# Patient Record
Sex: Male | Born: 1960 | Race: Black or African American | Hispanic: No | Marital: Single | Smoking: Never smoker
Health system: Southern US, Community
[De-identification: ages and names within clinical notes are randomized; demographics above are authoritative.]

## PROBLEM LIST (undated history)

## (undated) DIAGNOSIS — F79 Unspecified intellectual disabilities: Secondary | ICD-10-CM

## (undated) DIAGNOSIS — F419 Anxiety disorder, unspecified: Secondary | ICD-10-CM

## (undated) DIAGNOSIS — H6690 Otitis media, unspecified, unspecified ear: Secondary | ICD-10-CM

## (undated) DIAGNOSIS — I10 Essential (primary) hypertension: Secondary | ICD-10-CM

## (undated) DIAGNOSIS — F952 Tourette's disorder: Secondary | ICD-10-CM

## (undated) DIAGNOSIS — C801 Malignant (primary) neoplasm, unspecified: Secondary | ICD-10-CM

## (undated) DIAGNOSIS — E785 Hyperlipidemia, unspecified: Secondary | ICD-10-CM

## (undated) DIAGNOSIS — K859 Acute pancreatitis without necrosis or infection, unspecified: Secondary | ICD-10-CM

## (undated) DIAGNOSIS — F259 Schizoaffective disorder, unspecified: Secondary | ICD-10-CM

## (undated) DIAGNOSIS — H9190 Unspecified hearing loss, unspecified ear: Secondary | ICD-10-CM

## (undated) DIAGNOSIS — K5909 Other constipation: Secondary | ICD-10-CM

## (undated) DIAGNOSIS — R4587 Impulsiveness: Secondary | ICD-10-CM

## (undated) DIAGNOSIS — F99 Mental disorder, not otherwise specified: Secondary | ICD-10-CM

## (undated) DIAGNOSIS — E039 Hypothyroidism, unspecified: Secondary | ICD-10-CM

## (undated) HISTORY — DX: Hypothyroidism, unspecified: E03.9

## (undated) HISTORY — PX: EXTERNAL EAR SURGERY: SHX627

---

## 2011-03-25 ENCOUNTER — Encounter (HOSPITAL_COMMUNITY): Payer: Self-pay | Admitting: *Deleted

## 2011-03-25 ENCOUNTER — Emergency Department (HOSPITAL_COMMUNITY)
Admission: EM | Admit: 2011-03-25 | Discharge: 2011-03-26 | Disposition: A | Discharge status: Home Health | Payer: Medicare Other | Attending: Emergency Medicine | Admitting: Emergency Medicine

## 2011-03-25 DIAGNOSIS — E119 Type 2 diabetes mellitus without complications: Secondary | ICD-10-CM | POA: Insufficient documentation

## 2011-03-25 DIAGNOSIS — F79 Unspecified intellectual disabilities: Secondary | ICD-10-CM | POA: Insufficient documentation

## 2011-03-25 DIAGNOSIS — I10 Essential (primary) hypertension: Secondary | ICD-10-CM | POA: Insufficient documentation

## 2011-03-25 DIAGNOSIS — R109 Unspecified abdominal pain: Secondary | ICD-10-CM | POA: Insufficient documentation

## 2011-03-25 DIAGNOSIS — E785 Hyperlipidemia, unspecified: Secondary | ICD-10-CM | POA: Insufficient documentation

## 2011-03-25 DIAGNOSIS — R1110 Vomiting, unspecified: Secondary | ICD-10-CM | POA: Insufficient documentation

## 2011-03-25 HISTORY — DX: Essential (primary) hypertension: I10

## 2011-03-25 HISTORY — DX: Anxiety disorder, unspecified: F41.9

## 2011-03-25 HISTORY — DX: Hyperlipidemia, unspecified: E78.5

## 2011-03-25 HISTORY — DX: Mental disorder, not otherwise specified: F99

## 2011-03-25 LAB — CBC
HCT: 48.6 % (ref 39.0–52.0)
Hemoglobin: 17 g/dL (ref 13.0–17.0)
MCH: 28.3 pg (ref 26.0–34.0)
MCHC: 35 g/dL (ref 30.0–36.0)
MCV: 81 fL (ref 78.0–100.0)
Platelets: 320 10*3/uL (ref 150–400)
RBC: 6 MIL/uL — ABNORMAL HIGH (ref 4.22–5.81)
RDW: 13.9 % (ref 11.5–15.5)
WBC: 11.8 10*3/uL — ABNORMAL HIGH (ref 4.0–10.5)

## 2011-03-25 MED ORDER — HYDROMORPHONE HCL PF 1 MG/ML IJ SOLN
1.0000 mg | Freq: Once | INTRAMUSCULAR | Status: AC
  Administered 2011-03-25: 1 mg via INTRAVENOUS
  Filled 2011-03-25: qty 1

## 2011-03-25 MED ORDER — IOHEXOL 300 MG/ML  SOLN
20.0000 mL | INTRAMUSCULAR | Status: DC
  Administered 2011-03-25: 20 mL via ORAL

## 2011-03-25 MED ORDER — SODIUM CHLORIDE 0.9 % IV BOLUS (SEPSIS)
1000.0000 mL | Freq: Once | INTRAVENOUS | Status: AC
  Administered 2011-03-25: 1000 mL via INTRAVENOUS

## 2011-03-25 MED ORDER — DIAZEPAM 5 MG/ML IJ SOLN
5.0000 mg | Freq: Once | INTRAMUSCULAR | Status: AC
  Administered 2011-03-25: 5 mg via INTRAVENOUS
  Filled 2011-03-25: qty 2

## 2011-03-25 MED ORDER — ONDANSETRON HCL 4 MG/2ML IJ SOLN
4.0000 mg | Freq: Once | INTRAMUSCULAR | Status: AC
  Administered 2011-03-25: 4 mg via INTRAVENOUS
  Filled 2011-03-25: qty 2

## 2011-03-25 NOTE — ED Notes (Signed)
Pt states improved pain after pain medications, though he continues to cry.

## 2011-03-25 NOTE — ED Notes (Signed)
Per EMS: pt from [facility], vomited approximately 1 hour ago.  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  No guarding, abdominal masses.

## 2011-03-26 ENCOUNTER — Encounter (HOSPITAL_COMMUNITY): Payer: Self-pay | Admitting: Radiology

## 2011-03-26 ENCOUNTER — Emergency Department (HOSPITAL_COMMUNITY): Payer: Medicare Other

## 2011-03-26 LAB — COMPREHENSIVE METABOLIC PANEL
ALT: 47 U/L (ref 0–53)
AST: 36 U/L (ref 0–37)
Albumin: 4.1 g/dL (ref 3.5–5.2)
Alkaline Phosphatase: 102 U/L (ref 39–117)
BUN: 18 mg/dL (ref 6–23)
CO2: 20 mEq/L (ref 19–32)
Calcium: 9.9 mg/dL (ref 8.4–10.5)
Chloride: 104 mEq/L (ref 96–112)
Creatinine, Ser: 1.86 mg/dL — ABNORMAL HIGH (ref 0.50–1.35)
GFR calc Af Amer: 47 mL/min — ABNORMAL LOW (ref >90)
GFR calc non Af Amer: 40 mL/min — ABNORMAL LOW (ref >90)
Glucose, Bld: 173 mg/dL — ABNORMAL HIGH (ref 70–99)
Potassium: 3.8 mEq/L (ref 3.5–5.1)
Sodium: 140 mEq/L (ref 135–145)
Total Bilirubin: 0.7 mg/dL (ref 0.3–1.2)
Total Protein: 8.1 g/dL (ref 6.0–8.3)

## 2011-03-26 LAB — URINALYSIS, ROUTINE W REFLEX MICROSCOPIC
Bilirubin Urine: NEGATIVE
Glucose, UA: NEGATIVE mg/dL
Hgb urine dipstick: NEGATIVE
Ketones, ur: NEGATIVE mg/dL
Leukocytes, UA: NEGATIVE
Nitrite: NEGATIVE
Protein, ur: NEGATIVE mg/dL
Specific Gravity, Urine: 1.01 (ref 1.005–1.030)
Urobilinogen, UA: 0.2 mg/dL (ref 0.0–1.0)
pH: 5.5 (ref 5.0–8.0)

## 2011-03-26 LAB — LIPASE, BLOOD: Lipase: 40 U/L (ref 11–59)

## 2011-03-26 MED ORDER — ONDANSETRON 4 MG PO TBDP: 4.0000 mg | ORAL_TABLET | Freq: Four times a day (QID) | ORAL | Status: AC | PRN

## 2011-03-26 MED ORDER — HALOPERIDOL LACTATE 5 MG/ML IJ SOLN
3.0000 mg | Freq: Once | INTRAMUSCULAR | Status: AC
  Administered 2011-03-26: 3 mg via INTRAVENOUS
  Filled 2011-03-26: qty 1

## 2011-03-26 MED ORDER — IOHEXOL 300 MG/ML  SOLN
100.0000 mL | Freq: Once | INTRAMUSCULAR | Status: AC | PRN
  Administered 2011-03-26: 100 mL via INTRAVENOUS

## 2011-03-26 MED ORDER — METOCLOPRAMIDE HCL 5 MG/ML IJ SOLN
5.0000 mg | Freq: Once | INTRAMUSCULAR | Status: AC
  Administered 2011-03-26: 5 mg via INTRAVENOUS
  Filled 2011-03-26: qty 2

## 2011-03-26 NOTE — ED Notes (Signed)
Pt given PO challenge.

## 2011-03-26 NOTE — ED Provider Notes (Addendum)
Pt was making "crying noises" on my arrival to room, but did not have any tears.  Pt immediately stopped when I told him that his testing today was normal.  Pt has tol PO well while in ED without N/V.  Abd benign on exam.  VSS, resps easy.  Will d/c home with outpt f/u.   Ct Abdomen Pelvis W Contrast 03/26/2011  *RADIOLOGY REPORT*  Clinical Data: Abdominal pain; vomiting.  CT ABDOMEN AND PELVIS WITH CONTRAST  Technique:  Multidetector CT imaging of the abdomen and pelvis was performed following the standard protocol during bolus administration of intravenous contrast.  Contrast: OMNIPAQUE IOHEXOL 300 MG/ML IJ SOLN  Comparison: CT of the abdomen and pelvis performed 11/23/2008  Findings: Bibasilar airspace opacities appear grossly stable and likely reflect atelectasis and scarring.  The liver and spleen are unremarkable in appearance.  The gallbladder is within normal limits.  The pancreas and adrenal glands are unremarkable.  There is a 1.2 cm left renal cyst.  Additional smaller right renal cysts are seen.  Scattered nonspecific perinephric stranding is noted bilaterally.  There is no evidence of hydronephrosis.  No renal or ureteral stones are identified.  No free fluid is identified.  The small bowel is unremarkable in appearance.  The stomach is filled with contrast and is within normal limits.  No acute vascular abnormalities are seen.  The appendix is not seen; there is no evidence for appendicitis.  A single diverticulum is noted along the mid sigmoid colon.  The colon is otherwise unremarkable in appearance.  The bladder is moderately distended and grossly unremarkable in appearance.  The prostate is normal in size.  No inguinal lymphadenopathy is seen.  No acute osseous abnormalities are identified.  IMPRESSION:  1.  No acute abnormalities seen within the abdomen or pelvis. 2.  Scattered small bilateral renal cysts seen. 3.  Bibasilar airspace opacities appear grossly stable and likely reflect  atelectasis and scarring.  Original Report Authenticated By: Tonia Ghent, M.D.    Laray Anger, DO 03/26/11 [phone]

## 2011-03-26 NOTE — ED Notes (Signed)
Pt tolerated po crackers and gingerale.  Dr Clarene Duke notified.  PTAR called.

## 2011-03-26 NOTE — ED Provider Notes (Cosign Needed)
History    51 year old male with abdominal pain. 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 He lives in a group home. Per report he began complaining of abdominal pain today. He vomited once earlier today. No fevers reported. No reported trauma. Patient crying and moaning. Per sister and mother, acts like this when he is in pain. No history of any abdominal surgery. Waves hand across abdomen when asked to point to where hurts.  CSN: [id number]  Arrival date & time 03/25/11  2210   First MD Initiated Contact with Patient 03/25/11 2300      Chief Complaint  Patient presents with  . Abdominal Pain    (Consider location/radiation/quality/duration/timing/severity/associated sxs/prior treatment) HPI  Past Medical History  Diagnosis Date  . Diabetes mellitus   . Anxiety   . Hypertension   . Hyperlipemia   . Mental disorder     History reviewed. No pertinent past surgical history.  History reviewed. No pertinent family history.  History  Substance Use Topics  . Smoking status: Not on file  . Smokeless tobacco: Not on file  . Alcohol Use:       Review of Systems  Level 5 caveat because of mental retardation.  Allergies  Review of patient's allergies indicates no known allergies.  Home Medications   Current Outpatient Rx  Name Route Sig Dispense Refill  . ASPIRIN 81 MG PO CHEW Oral Chew 81 mg by mouth daily.    Marland Kitchen LORATADINE 10 MG PO TABS Oral Take 10 mg by mouth daily.    Marland Kitchen LORAZEPAM 1 MG PO TABS Oral Take 1 mg by mouth every 12 (twelve) hours as needed. For agitation    . LUBIPROSTONE 24 MCG PO CAPS Oral Take 24 mcg by mouth 2 (two) times daily with a meal.    . METFORMIN HCL ER 500 MG PO TB24 Oral Take 500 mg by mouth daily with breakfast.    . OMEPRAZOLE 20 MG PO CPDR Oral Take 20 mg by mouth daily.    Marland Kitchen PRAVASTATIN SODIUM 20 MG PO TABS Oral Take 20 mg by mouth daily.    . QUETIAPINE FUMARATE 100 MG PO TABS Oral Take 100 mg by mouth 2  (two) times daily.      BP 159/111  Pulse 136  Temp(Src) 100.1 F (37.8 C) (Oral)  Resp 17  SpO2 95%  Physical Exam  Nursing note and vitals reviewed. Constitutional:       At bedtime appearing. Crying.  rocking and moaning.  HENT:  Head: Normocephalic and atraumatic.  Eyes: Conjunctivae are normal. Pupils are equal, round, and reactive to light. Right eye exhibits no discharge. Left eye exhibits no discharge.  Neck: Neck supple.  Cardiovascular: Normal rate, regular rhythm and normal heart sounds.  Exam reveals no gallop and no friction rub.   No murmur heard. Pulmonary/Chest: Effort normal and breath sounds normal. No respiratory distress.  Abdominal: He exhibits no distension. There is tenderness.       Difficult to examine because patient tensing abdominal muscles when crying and difficult to console. Does appear to be tender diffusely though.  Genitourinary:       No costovertebral angle tenderness  Musculoskeletal: He exhibits no edema and no tenderness.  Neurological: He is alert.  Skin: Skin is warm and dry. He is not diaphoretic.    ED Course  Procedures (including critical care time)  Labs Reviewed  COMPREHENSIVE METABOLIC PANEL - Abnormal; Notable for the following:    Glucose,  Bld 173 (*)    Creatinine, Ser 1.86 (*)    GFR calc non Af Amer 40 (*)    GFR calc Af Amer 47 (*)    All other components within normal limits  CBC - Abnormal; Notable for the following:    WBC 11.8 (*)    RBC 6.00 (*)    All other components within normal limits  LIPASE, BLOOD  URINALYSIS, ROUTINE W REFLEX MICROSCOPIC   No results found.   1. Abdominal pain       MDM  50yM with abdominal pain. Cr 1.86. Unfortunately no prior labs for comparison. Family pleasant but poor historians and not sure is hx of renal insufficiency or not. Pt is diabetic and has hx of HTN. Suspect underlying chronic kidney dz although cannot definitively confirm. CT with no evidence of acute pathology  which would explain abdominal pain. Possible viral illness. Unfortunately MR makes obtaining history very difficult and makes examination difficult as well. There does not appear to be an emergent etiology of this complaint though. Afebrile and HD stable. Mild leukocytosis but may be stress demargination. UA pending. Discussed with Dr. Clarene Duke in signout. Will DC back to group home. Abdominal pain instructions.        Raeford Razor, MD 03/26/11 [phone]

## 2011-03-26 NOTE — ED Notes (Signed)
PT calming down after haldol.  CT called to take pt.  PT has retained contrast.

## 2011-03-26 NOTE — ED Notes (Signed)
Pt is crying inconsolably stating that he wants to go home.  Hx of MR, but he states he has been having abd pain since this am. Abd non-tender. Pt is crying to loud to hear bs.

## 2011-03-26 NOTE — ED Notes (Signed)
PT states pain and nausea beginning to increase and he would like some pain and nausea meds.  Dr Clarene Duke notified.

## 2011-03-26 NOTE — Discharge Instructions (Signed)
Abdominal Pain Abdominal pain can be caused by many things. Your caregiver decides the seriousness of your pain by an examination and possibly blood tests and X-rays. Many cases can be observed and treated at home. Most abdominal pain is not caused by a disease and will probably improve without treatment. However, in many cases, more time must pass before a clear cause of the pain can be found. Before that point, it may not be known if you need more testing, or if hospitalization or surgery is needed. HOME CARE INSTRUCTIONS   Do not take laxatives unless directed by your caregiver.   Take pain medicine only as directed by your caregiver.   Only take over-the-counter or prescription medicines for pain, discomfort, or fever as directed by your caregiver.   Try a clear liquid diet (broth, tea, or water) for as long as directed by your caregiver. Slowly move to a bland diet as tolerated.  SEEK IMMEDIATE MEDICAL CARE IF:   The pain does not go away.   You have a fever.   You keep throwing up (vomiting).   The pain is felt only in portions of the abdomen. Pain in the right side could possibly be appendicitis. In an adult, pain in the left lower portion of the abdomen could be colitis or diverticulitis.   You pass bloody or black tarry stools.  MAKE SURE YOU:   Understand these instructions.   Will watch your condition.   Will get help right away if you are not doing well or get worse.  Document Released: 10/22/2004 Document Revised: 09/24/2010 Document Reviewed: 08/31/2007 [facility] Patient Information 2012 [place], [place]. RESOURCE GUIDE  Dental Problems  Patients with Medicaid: [facility] [phone] [address]                                           [phone] [address] Phone:  [phone]                                                  Phone:  [phone]  If unable to pay or uninsured, contact:  [facility] or [facility]. to become qualified for the adult dental clinic.  Chronic Pain Problems Contact [facility]  [phone] Patients need to be referred by their primary care doctor.  Insufficient Money for Medicine Contact [employer]:  call "211" or [facility] 231-038-8999.  No Primary Care Doctor Call [facility]  [phone] Other agencies that provide inexpensive medical care    [facility]  [phone]    [facility]  [phone]    [facility]  [phone]    [facility]  [phone]    [facility]  2091429391    [facility]  [phone]  Psychological Services [facility]  [phone] [facility]  [phone] [facility]   [phone] (emergency services [phone])  Substance Abuse Resources [facility]  [phone] [facility] [phone] [facility] [place] [phone] Floydene Flock [phone] Residential & Outpatient Substance Abuse Program  [phone]  Abuse/Neglect [facility] Child Abuse Hotline 8476857751 [facility] Child Abuse Hotline 639-331-0321 (After Hours)  Emergency Shelter [facility] [phone]  [facility] [place] [facility] [phone] [facility] [phone]  MRSA Hotline #:   213-515-6495    [facility] Resources  [facility]     [employer]                          [facility] Dept. [address] [place]                       [address]      [address]  Blondell Reveal Phone:  [phone]                                   Phone:  [phone]                 Phone:  [phone]  [facility] Mental [facility] Phone:  [phone]  [facility] Child Abuse Hotline 6296702044 5097013454 (After  Hours)    Take the prescription as directed.  Continue your usual prescriptions as previously directed.  Increase your fluid intake (ie:  Gatoraide) for the next few days.  Eat a bland diet and advance to your regular diet slowly as you can tolerate it.  Call your regular medical doctor today to schedule a follow up appointment this week.  Return to the Emergency Department immediately if not improving (or even worsening) despite taking the medicines as prescribed, any black or bloody stool or vomit, if you develop a fever, or for any other concerns.

## 2011-03-27 LAB — URINE CULTURE

## 2011-08-20 ENCOUNTER — Encounter (HOSPITAL_COMMUNITY): Payer: Self-pay

## 2011-08-20 ENCOUNTER — Emergency Department (HOSPITAL_COMMUNITY)
Admission: EM | Admit: 2011-08-20 | Discharge: 2011-08-21 | Disposition: A | Discharge status: Skilled Nursing Facility | Payer: Medicare Other | Attending: Emergency Medicine | Admitting: Emergency Medicine

## 2011-08-20 DIAGNOSIS — F259 Schizoaffective disorder, unspecified: Secondary | ICD-10-CM | POA: Insufficient documentation

## 2011-08-20 DIAGNOSIS — F419 Anxiety disorder, unspecified: Secondary | ICD-10-CM

## 2011-08-20 DIAGNOSIS — F411 Generalized anxiety disorder: Secondary | ICD-10-CM | POA: Insufficient documentation

## 2011-08-20 DIAGNOSIS — I10 Essential (primary) hypertension: Secondary | ICD-10-CM | POA: Insufficient documentation

## 2011-08-20 DIAGNOSIS — E119 Type 2 diabetes mellitus without complications: Secondary | ICD-10-CM | POA: Insufficient documentation

## 2011-08-20 DIAGNOSIS — R509 Fever, unspecified: Secondary | ICD-10-CM | POA: Insufficient documentation

## 2011-08-20 DIAGNOSIS — E785 Hyperlipidemia, unspecified: Secondary | ICD-10-CM | POA: Insufficient documentation

## 2011-08-20 DIAGNOSIS — J40 Bronchitis, not specified as acute or chronic: Secondary | ICD-10-CM

## 2011-08-20 HISTORY — DX: Malignant (primary) neoplasm, unspecified: C80.1

## 2011-08-20 HISTORY — DX: Otitis media, unspecified, unspecified ear: H66.90

## 2011-08-20 HISTORY — DX: Acute pancreatitis without necrosis or infection, unspecified: K85.90

## 2011-08-20 HISTORY — DX: Unspecified hearing loss, unspecified ear: H91.90

## 2011-08-20 HISTORY — DX: Impulsiveness: R45.87

## 2011-08-20 HISTORY — DX: Schizoaffective disorder, unspecified: F25.9

## 2011-08-20 MED ORDER — LORAZEPAM 2 MG/ML IJ SOLN
1.0000 mg | Freq: Once | INTRAMUSCULAR | Status: AC
  Administered 2011-08-21: 1 mg via INTRAVENOUS
  Filled 2011-08-20: qty 1

## 2011-08-20 NOTE — ED Notes (Signed)
Pt presents to ED via EMS from home with c/o severe agitation.  Pt hx MR with "brain capacity of 51 yr old"  Pt c/o "I think i am about to die".  Pt excessive crying at this time.

## 2011-08-21 ENCOUNTER — Emergency Department (HOSPITAL_COMMUNITY): Payer: Medicare Other

## 2011-08-21 LAB — URINALYSIS, ROUTINE W REFLEX MICROSCOPIC
Ketones, ur: NEGATIVE mg/dL
Leukocytes, UA: NEGATIVE
Nitrite: NEGATIVE
Protein, ur: NEGATIVE mg/dL
Urobilinogen, UA: 1 mg/dL (ref 0.0–1.0)

## 2011-08-21 LAB — COMPREHENSIVE METABOLIC PANEL
Albumin: 3.9 g/dL (ref 3.5–5.2)
Alkaline Phosphatase: 101 U/L (ref 39–117)
BUN: 14 mg/dL (ref 6–23)
Chloride: 100 mEq/L (ref 96–112)
Creatinine, Ser: 1.61 mg/dL — ABNORMAL HIGH (ref 0.50–1.35)
GFR calc Af Amer: 55 mL/min — ABNORMAL LOW (ref >90)
GFR calc non Af Amer: 48 mL/min — ABNORMAL LOW (ref >90)
Glucose, Bld: 151 mg/dL — ABNORMAL HIGH (ref 70–99)
Potassium: 3.8 mEq/L (ref 3.5–5.1)
Total Bilirubin: 0.3 mg/dL (ref 0.3–1.2)

## 2011-08-21 LAB — CBC WITH DIFFERENTIAL/PLATELET
Basophils Relative: 0 % (ref 0–1)
HCT: 45.9 % (ref 39.0–52.0)
Hemoglobin: 15.7 g/dL (ref 13.0–17.0)
Lymphs Abs: 0.8 10*3/uL (ref 0.7–4.0)
MCH: 27.4 pg (ref 26.0–34.0)
MCHC: 34.2 g/dL (ref 30.0–36.0)
Monocytes Absolute: 0.9 10*3/uL (ref 0.1–1.0)
Monocytes Relative: 11 % (ref 3–12)
Neutro Abs: 6.3 10*3/uL (ref 1.7–7.7)
RBC: 5.74 MIL/uL (ref 4.22–5.81)

## 2011-08-21 LAB — GLUCOSE, CAPILLARY: Glucose-Capillary: 143 mg/dL — ABNORMAL HIGH (ref 70–99)

## 2011-08-21 MED ORDER — ACETAMINOPHEN 325 MG PO TABS
650.0000 mg | ORAL_TABLET | Freq: Once | ORAL | Status: AC
  Administered 2011-08-21: 650 mg via ORAL
  Filled 2011-08-21: qty 2

## 2011-08-21 MED ORDER — LORAZEPAM 2 MG/ML IJ SOLN
1.0000 mg | Freq: Once | INTRAMUSCULAR | Status: DC
  Filled 2011-08-21: qty 1

## 2011-08-21 MED ORDER — ZIPRASIDONE MESYLATE 20 MG IM SOLR
10.0000 mg | Freq: Once | INTRAMUSCULAR | Status: AC
  Administered 2011-08-21: 10 mg via INTRAMUSCULAR
  Filled 2011-08-21 (×2): qty 20

## 2011-08-21 MED ORDER — ZIPRASIDONE MESYLATE 20 MG IM SOLR
20.0000 mg | Freq: Once | INTRAMUSCULAR | Status: AC
  Administered 2011-08-21: 20 mg via INTRAMUSCULAR
  Filled 2011-08-21: qty 20

## 2011-08-21 MED ORDER — HALOPERIDOL 2 MG PO TABS: ORAL_TABLET | ORAL | Status: DC

## 2011-08-21 NOTE — ED Notes (Signed)
Pt crying out loud, uncontrollably.

## 2011-08-21 NOTE — ED Notes (Signed)
Pt laughing and coughing. Pt keeps stating, "I think I'm going to die."

## 2011-08-21 NOTE — ED Notes (Addendum)
[facility] contacted and informed pt was just given Geodon and Aceteminophen. [name] informed.

## 2011-08-21 NOTE — ED Provider Notes (Signed)
History     CSN: [id number]  Arrival date & time 08/20/11  2319   First MD Initiated Contact with Patient 08/20/11 2328      Chief Complaint  Patient presents with  . Anxiety    (Consider location/radiation/quality/duration/timing/severity/associated sxs/prior treatment) Patient is a 51 y.o. male presenting with altered mental status. The history is provided by the EMS personnel (pt brought from group home with anxiety). No language interpreter was used.  Altered Mental Status This is a new problem. The current episode started 1 to 2 hours ago. The problem occurs constantly. The problem has not changed since onset.Pertinent negatives include no abdominal pain. Nothing aggravates the symptoms. Nothing relieves the symptoms. He has tried nothing for the symptoms. The treatment provided moderate relief.    Past Medical History  Diagnosis Date  . Diabetes mellitus   . Anxiety   . Hypertension   . Hyperlipemia   . Mental disorder   . Schizoaffective disorder   . Impulsive control disorder  . Pancreatitis   . Otitis media   . Cancer   . HOH (hard of hearing)     No past surgical history on file.  No family history on file.  History  Substance Use Topics  . Smoking status: Not on file  . Smokeless tobacco: Not on file  . Alcohol Use:       Review of Systems  Unable to perform ROS: Mental status change  Gastrointestinal: Negative for abdominal pain.  Psychiatric/Behavioral: Positive for altered mental status.    Allergies  Review of patient's allergies indicates no known allergies.  Home Medications   Current Outpatient Rx  Name Route Sig Dispense Refill  . ASPIRIN 81 MG PO CHEW Oral Chew 81 mg by mouth daily.    . CHOLECALCIFEROL 400 UNITS PO TABS Oral Take 400 Units by mouth daily.    Marland Kitchen LORATADINE 10 MG PO TABS Oral Take 10 mg by mouth daily.    Marland Kitchen LORAZEPAM 1 MG PO TABS Oral Take 1 mg by mouth every 8 (eight) hours as needed. For agitation    . LUBIPROSTONE  24 MCG PO CAPS Oral Take 24 mcg by mouth 2 (two) times daily with a meal.    . METFORMIN HCL ER 500 MG PO TB24 Oral Take 500 mg by mouth daily with breakfast.    . PRAVASTATIN SODIUM 20 MG PO TABS Oral Take 20 mg by mouth at bedtime.     Marland Kitchen QUETIAPINE FUMARATE 100 MG PO TABS Oral Take 100 mg by mouth 2 (two) times daily.      BP 133/95  Pulse 109  Temp 101.5 F (38.6 C) (Oral)  Resp 23  SpO2 93%  Physical Exam  Constitutional: He appears well-developed.  HENT:  Head: Normocephalic and atraumatic.  Eyes: Conjunctivae and EOM are normal. No scleral icterus.  Neck: Neck supple. No thyromegaly present.  Cardiovascular: Normal rate and regular rhythm.  Exam reveals no gallop and no friction rub.   No murmur heard. Pulmonary/Chest: No stridor. He has no wheezes. He has no rales. He exhibits no tenderness.  Abdominal: He exhibits no distension. There is no tenderness. There is no rebound.  Musculoskeletal: Normal range of motion. He exhibits no edema.  Lymphadenopathy:    He has no cervical adenopathy.  Neurological:       Awake,  But very anxious and not speaking  Skin: No rash noted. No erythema.  Psychiatric:       anxious    ED  Course  Procedures (including critical care time)  Labs Reviewed  CBC WITH DIFFERENTIAL - Abnormal; Notable for the following:    Lymphocytes Relative 10 (*)     All other components within normal limits  COMPREHENSIVE METABOLIC PANEL - Abnormal; Notable for the following:    Glucose, Bld 151 (*)     Creatinine, Ser 1.61 (*)     GFR calc non Af Amer 48 (*)     GFR calc Af Amer 55 (*)     All other components within normal limits  GLUCOSE, CAPILLARY - Abnormal; Notable for the following:    Glucose-Capillary 143 (*)     All other components within normal limits  URINALYSIS, ROUTINE W REFLEX MICROSCOPIC   Dg Chest Port 1 View  08/21/2011  *RADIOLOGY REPORT*  Clinical Data: Shortness of breath.  PORTABLE CHEST - 1 VIEW  Comparison: 09/28/2009   Findings: Low lung volumes with bibasilar atelectasis.  No edema or infiltrates.  Heart size is normal.  No bony abnormalities identified.  IMPRESSION: Low lung volumes with bibasilar atelectasis.  Original Report Authenticated By: Reola Calkins, M.D.     1. Anxiety   2. Fever      Pt improved with geodon MDM          Benny Lennert, MD 08/21/11 [phone]

## 2011-08-21 NOTE — ED Notes (Addendum)
[facility]- Shawn Baird, the caregiver- informed about pt being discharged from ED. PTAR called.

## 2011-08-22 ENCOUNTER — Emergency Department (HOSPITAL_COMMUNITY): Payer: Medicare Other

## 2011-08-22 ENCOUNTER — Emergency Department (HOSPITAL_COMMUNITY)
Admission: EM | Admit: 2011-08-22 | Discharge: 2011-08-22 | Disposition: A | Discharge status: Home / Self Care | Payer: Medicare Other | Attending: Emergency Medicine | Admitting: Emergency Medicine

## 2011-08-22 DIAGNOSIS — F259 Schizoaffective disorder, unspecified: Secondary | ICD-10-CM | POA: Insufficient documentation

## 2011-08-22 DIAGNOSIS — E119 Type 2 diabetes mellitus without complications: Secondary | ICD-10-CM | POA: Insufficient documentation

## 2011-08-22 DIAGNOSIS — J189 Pneumonia, unspecified organism: Secondary | ICD-10-CM

## 2011-08-22 DIAGNOSIS — I10 Essential (primary) hypertension: Secondary | ICD-10-CM | POA: Insufficient documentation

## 2011-08-22 DIAGNOSIS — F411 Generalized anxiety disorder: Secondary | ICD-10-CM | POA: Insufficient documentation

## 2011-08-22 DIAGNOSIS — E785 Hyperlipidemia, unspecified: Secondary | ICD-10-CM | POA: Insufficient documentation

## 2011-08-22 LAB — URINALYSIS, ROUTINE W REFLEX MICROSCOPIC
Glucose, UA: NEGATIVE mg/dL
Hgb urine dipstick: NEGATIVE
Protein, ur: 30 mg/dL — AB
Urobilinogen, UA: 0.2 mg/dL (ref 0.0–1.0)

## 2011-08-22 LAB — CBC WITH DIFFERENTIAL/PLATELET
Basophils Relative: 1 % (ref 0–1)
HCT: 48.3 % (ref 39.0–52.0)
Hemoglobin: 16.3 g/dL (ref 13.0–17.0)
Lymphocytes Relative: 18 % (ref 12–46)
Lymphs Abs: 1.3 10*3/uL (ref 0.7–4.0)
MCHC: 33.7 g/dL (ref 30.0–36.0)
Monocytes Absolute: 1 10*3/uL (ref 0.1–1.0)
Monocytes Relative: 15 % — ABNORMAL HIGH (ref 3–12)
Neutro Abs: 4.3 10*3/uL (ref 1.7–7.7)
Neutrophils Relative %: 64 % (ref 43–77)
RBC: 6.03 MIL/uL — ABNORMAL HIGH (ref 4.22–5.81)
WBC: 6.8 10*3/uL (ref 4.0–10.5)

## 2011-08-22 LAB — COMPREHENSIVE METABOLIC PANEL
Albumin: 4 g/dL (ref 3.5–5.2)
Alkaline Phosphatase: 104 U/L (ref 39–117)
BUN: 15 mg/dL (ref 6–23)
CO2: 21 mEq/L (ref 19–32)
Chloride: 103 mEq/L (ref 96–112)
Creatinine, Ser: 1.84 mg/dL — ABNORMAL HIGH (ref 0.50–1.35)
GFR calc non Af Amer: 41 mL/min — ABNORMAL LOW (ref >90)
Potassium: 3.8 mEq/L (ref 3.5–5.1)
Total Bilirubin: 0.5 mg/dL (ref 0.3–1.2)

## 2011-08-22 LAB — URINE MICROSCOPIC-ADD ON

## 2011-08-22 MED ORDER — ONDANSETRON HCL 4 MG/2ML IJ SOLN
4.0000 mg | Freq: Once | INTRAMUSCULAR | Status: AC
  Administered 2011-08-22: 4 mg via INTRAVENOUS
  Filled 2011-08-22: qty 2

## 2011-08-22 MED ORDER — MOXIFLOXACIN HCL 400 MG PO TABS: 400.0000 mg | ORAL_TABLET | Freq: Every day | ORAL | Status: AC

## 2011-08-22 MED ORDER — MOXIFLOXACIN HCL IN NACL 400 MG/250ML IV SOLN
400.0000 mg | Freq: Once | INTRAVENOUS | Status: AC
  Administered 2011-08-22: 400 mg via INTRAVENOUS
  Filled 2011-08-22: qty 250

## 2011-08-22 MED ORDER — HYDROCODONE-ACETAMINOPHEN 5-325 MG PO TABS: ORAL_TABLET | ORAL | Status: DC

## 2011-08-22 MED ORDER — MORPHINE SULFATE 4 MG/ML IJ SOLN
4.0000 mg | Freq: Once | INTRAMUSCULAR | Status: AC
  Administered 2011-08-22: 4 mg via INTRAVENOUS
  Filled 2011-08-22: qty 1

## 2011-08-22 NOTE — ED Notes (Signed)
Bed:WA13<BR> Expected date:<BR> Expected time:<BR> Means of arrival:<BR> Comments:<BR> EMS

## 2011-08-22 NOTE — ED Notes (Addendum)
Pt is MR individual w/ hx of intermittent, recurring abdominal pain. Pt unable to communicate verbally at this time. Pt is crying, abdomen is soft, non-tender to palpation, positive BS x 4 quads. EMS started PIV L AC #20. CBG 118 per EMS. VS: 118 palpated, 90, 20 bilateral lung fields clear per EMS.

## 2011-08-22 NOTE — ED Notes (Signed)
Patient is alert and oriented x0.  Sister was given DC instructions and follow up visit instructions.  Sister gave verbal understanding.  He was DC via wheelchair with sister to facility.  V/S stable.  He was not showing any signs of distress on DC

## 2011-08-22 NOTE — ED Provider Notes (Signed)
History     CSN: [id number]  Arrival date & time 08/22/11  0012   First MD Initiated Contact with Patient 08/22/11 0103      Chief Complaint  Patient presents with  . Abdominal Pain   Level 5 Caveat because of mental retardation  (Consider location/radiation/quality/duration/timing/severity/associated sxs/prior treatment) HPI Patient presents to the emergency department via EMS. History is very hard to obtain from patient. He indicates that he has been coughing today. He denies chest pain. He does state he has been having abdominal pain "off and on". He had nausea without vomiting. His family states he was seen here in ED yesterday and seemed fine this morning however this evening he started getting worse.  Sister reports that her mother died in 08/18/2022. She is not sure if he is having some depression.  Past Medical History  Diagnosis Date  . Diabetes mellitus   . Anxiety   . Hypertension   . Hyperlipemia   . Mental disorder   . Schizoaffective disorder   . Impulsive control disorder  . Pancreatitis   . Otitis media   . Cancer   . HOH (hard of hearing)     No past surgical history on file.  No family history on file.  History  Substance Use Topics  . Smoking status: No  . Smokeless tobacco: Not on file  . Alcohol Use:   lives in group home Sister is guardian    Review of Systems  Unable to perform ROS: Other    Allergies  Review of patient's allergies indicates no known allergies.  Home Medications   Current Outpatient Rx  Name Route Sig Dispense Refill  . ASPIRIN 81 MG PO CHEW Oral Chew 81 mg by mouth daily.    . CHOLECALCIFEROL 400 UNITS PO TABS Oral Take 400 Units by mouth daily.    Marland Kitchen HALOPERIDOL 2 MG PO TABS Oral Take 2 mg by mouth 2 (two) times daily.    Marland Kitchen LORATADINE 10 MG PO TABS Oral Take 10 mg by mouth daily.    Marland Kitchen LORAZEPAM 1 MG PO TABS Oral Take 1 mg by mouth every 8 (eight) hours as needed. For agitation    . LUBIPROSTONE 24 MCG PO CAPS Oral  Take 24 mcg by mouth 2 (two) times daily with a meal.    . METFORMIN HCL ER 500 MG PO TB24 Oral Take 500 mg by mouth daily with breakfast.    . PRAVASTATIN SODIUM 20 MG PO TABS Oral Take 20 mg by mouth at bedtime.     Marland Kitchen QUETIAPINE FUMARATE 100 MG PO TABS Oral Take 100 mg by mouth 2 (two) times daily.      BP 148/95  Pulse 111  Temp 100.1 F (37.8 C) (Oral)  Resp 22  SpO2 97%  Vital signs normal tachycardia, low-grade temp   Physical Exam  Nursing note and vitals reviewed. Constitutional: He appears well-developed and well-nourished.  Non-toxic appearance. He does not appear ill. No distress.       Patient yelling out, it's hard to tell his crying or laughing. Patient is noted to be tearful. His family reports this is how he asked when he has pain.  HENT:  Head: Normocephalic and atraumatic.  Right Ear: External ear normal.  Left Ear: External ear normal.  Nose: Nose normal. No mucosal edema or rhinorrhea.  Mouth/Throat: Mucous membranes are normal. No dental abscesses or uvula swelling.       Tongue dry  Eyes: Conjunctivae and EOM are  normal. Pupils are equal, round, and reactive to light.  Neck: Normal range of motion and full passive range of motion without pain. Neck supple.  Cardiovascular: Normal rate, regular rhythm and normal heart sounds.  Exam reveals no gallop and no friction rub.   No murmur heard. Pulmonary/Chest: Effort normal and breath sounds normal. No respiratory distress. He has no wheezes. He has no rhonchi. He has no rales. He exhibits no tenderness and no crepitus.  Abdominal: Soft. Normal appearance and bowel sounds are normal. He exhibits no distension. There is tenderness. There is no rebound and no guarding.       Patient has diffuse abdominal tenderness without guarding or rebound however he denies having pain  Musculoskeletal: Normal range of motion. He exhibits no edema and no tenderness.       Moves all extremities well.   Neurological: He is alert. He  has normal strength. No cranial nerve deficit.  Skin: Skin is warm, dry and intact. No rash noted. No erythema. No pallor.  Psychiatric: He has a normal mood and affect. His speech is normal and behavior is normal. His mood appears not anxious.    ED Course  Procedures (including critical care time)   Medications  morphine 4 MG/ML injection 4 mg (4 mg Intravenous Given 08/22/11 0206)  ondansetron (ZOFRAN) injection 4 mg (4 mg Intravenous Given 08/22/11 0205)  moxifloxacin (AVELOX) IVPB 400 mg (400 mg Intravenous Given 08/22/11 0259)   Sister and brother aware of diagnosis and treatment.  Results for orders placed during the hospital encounter of 08/22/11  CBC WITH DIFFERENTIAL      Component Value Range   WBC 6.8  4.0 - 10.5 K/uL   RBC 6.03 (*) 4.22 - 5.81 MIL/uL   Hemoglobin 16.3  13.0 - 17.0 g/dL   HCT 16.1  09.6 - 04.5 %   MCV 80.1  78.0 - 100.0 fL   MCH 27.0  26.0 - 34.0 pg   MCHC 33.7  30.0 - 36.0 g/dL   RDW 40.9  81.1 - 91.4 %   Platelets 303  150 - 400 K/uL   Neutrophils Relative 64  43 - 77 %   Neutro Abs 4.3  1.7 - 7.7 K/uL   Lymphocytes Relative 18  12 - 46 %   Lymphs Abs 1.3  0.7 - 4.0 K/uL   Monocytes Relative 15 (*) 3 - 12 %   Monocytes Absolute 1.0  0.1 - 1.0 K/uL   Eosinophils Relative 2  0 - 5 %   Eosinophils Absolute 0.1  0.0 - 0.7 K/uL   Basophils Relative 1  0 - 1 %   Basophils Absolute 0.1  0.0 - 0.1 K/uL  COMPREHENSIVE METABOLIC PANEL      Component Value Range   Sodium 138  135 - 145 mEq/L   Potassium 3.8  3.5 - 5.1 mEq/L   Chloride 103  96 - 112 mEq/L   CO2 21  19 - 32 mEq/L   Glucose, Bld 159 (*) 70 - 99 mg/dL   BUN 15  6 - 23 mg/dL   Creatinine, Ser 7.82 (*) 0.50 - 1.35 mg/dL   Calcium 9.1  8.4 - 95.6 mg/dL   Total Protein 7.9  6.0 - 8.3 g/dL   Albumin 4.0  3.5 - 5.2 g/dL   AST 34  0 - 37 U/L   ALT 37  0 - 53 U/L   Alkaline Phosphatase 104  39 - 117 U/L   Total Bilirubin 0.5  0.3 - 1.2 mg/dL   GFR calc non Af Amer 41 (*) >90 mL/min   GFR  calc Af Amer 47 (*) >90 mL/min  URINALYSIS, ROUTINE W REFLEX MICROSCOPIC      Component Value Range   Color, Urine YELLOW  YELLOW   APPearance CLEAR  CLEAR   Specific Gravity, Urine 1.022  1.005 - 1.030   pH 5.5  5.0 - 8.0   Glucose, UA NEGATIVE  NEGATIVE mg/dL   Hgb urine dipstick NEGATIVE  NEGATIVE   Bilirubin Urine SMALL (*) NEGATIVE   Ketones, ur TRACE (*) NEGATIVE mg/dL   Protein, ur 30 (*) NEGATIVE mg/dL   Urobilinogen, UA 0.2  0.0 - 1.0 mg/dL   Nitrite NEGATIVE  NEGATIVE   Leukocytes, UA NEGATIVE  NEGATIVE  LIPASE, BLOOD      Component Value Range   Lipase 44  11 - 59 U/L  URINE MICROSCOPIC-ADD ON      Component Value Range   Squamous Epithelial / LPF RARE  RARE   WBC, UA 0-2  <3 WBC/hpf   Casts HYALINE CASTS (*) NEGATIVE   Urine-Other MUCOUS PRESENT     Laboratory interpretation all normal except renal insufficiency   Dg Chest 2 View  08/22/2011  *RADIOLOGY REPORT*  Clinical Data: Cough  CHEST - 2 VIEW  Comparison: 08/21/2011  Findings: Low volumes.  Bibasilar atelectasis.  Upper normal heart size.  No pneumothorax.  No pleural effusion.  IMPRESSION: Stable bibasilar atelectasis.  Original Report Authenticated By: Donavan Burnet, M.D.   Dg Chest Port 1 View  08/21/2011  *RADIOLOGY REPORT*  Clinical Data: Shortness of breath.  PORTABLE CHEST - 1 VIEW  Comparison: 09/28/2009  Findings: Low lung volumes with bibasilar atelectasis.  No edema or infiltrates.  Heart size is normal.  No bony abnormalities identified.  IMPRESSION: Low lung volumes with bibasilar atelectasis.  Original Report Authenticated By: Reola Calkins, M.D.   Dg Abd 2 Views  08/22/2011  *RADIOLOGY REPORT*  Clinical Data: Abdominal pain  ABDOMEN - 2 VIEW  Comparison: 03/26/2011  Findings: No free intraperitoneal gas.  Gas filled stomach and colon are noted.  Left lower lobe pulmonary airspace disease verses atelectasis is suspected.   Unremarkable soft tissues.  IMPRESSION: Nonobstructive bowel gas pattern.   Left lower lobe atelectasis verses airspace disease is suspected.  Original Report Authenticated By: Donavan Burnet, M.D.     1. Community acquired pneumonia    New Prescriptions   HYDROCODONE-ACETAMINOPHEN (NORCO) 5-325 MG PER TABLET    Give 2 po QID prn pain   MOXIFLOXACIN (AVELOX) 400 MG TABLET    Take 1 tablet (400 mg total) by mouth daily.    Plan discharge  Devoria Albe, MD, FACEP   MDM          Ward Givens, MD 08/22/11 [phone]

## 2011-12-30 ENCOUNTER — Encounter (HOSPITAL_COMMUNITY): Payer: Self-pay | Admitting: *Deleted

## 2011-12-30 ENCOUNTER — Emergency Department (HOSPITAL_COMMUNITY)
Admission: EM | Admit: 2011-12-30 | Discharge: 2012-01-04 | Disposition: A | Discharge status: Home / Self Care | Payer: Medicare Other | Attending: Emergency Medicine | Admitting: Emergency Medicine

## 2011-12-30 DIAGNOSIS — Z859 Personal history of malignant neoplasm, unspecified: Secondary | ICD-10-CM | POA: Insufficient documentation

## 2011-12-30 DIAGNOSIS — Z7982 Long term (current) use of aspirin: Secondary | ICD-10-CM | POA: Insufficient documentation

## 2011-12-30 DIAGNOSIS — E785 Hyperlipidemia, unspecified: Secondary | ICD-10-CM | POA: Insufficient documentation

## 2011-12-30 DIAGNOSIS — H9190 Unspecified hearing loss, unspecified ear: Secondary | ICD-10-CM | POA: Insufficient documentation

## 2011-12-30 DIAGNOSIS — I10 Essential (primary) hypertension: Secondary | ICD-10-CM | POA: Insufficient documentation

## 2011-12-30 DIAGNOSIS — F411 Generalized anxiety disorder: Secondary | ICD-10-CM | POA: Insufficient documentation

## 2011-12-30 DIAGNOSIS — Z79899 Other long term (current) drug therapy: Secondary | ICD-10-CM | POA: Insufficient documentation

## 2011-12-30 DIAGNOSIS — F259 Schizoaffective disorder, unspecified: Secondary | ICD-10-CM | POA: Insufficient documentation

## 2011-12-30 DIAGNOSIS — F29 Unspecified psychosis not due to a substance or known physiological condition: Secondary | ICD-10-CM | POA: Insufficient documentation

## 2011-12-30 DIAGNOSIS — E119 Type 2 diabetes mellitus without complications: Secondary | ICD-10-CM | POA: Insufficient documentation

## 2011-12-30 LAB — COMPREHENSIVE METABOLIC PANEL
ALT: 35 U/L (ref 0–53)
Alkaline Phosphatase: 110 U/L (ref 39–117)
BUN: 17 mg/dL (ref 6–23)
CO2: 23 mEq/L (ref 19–32)
Chloride: 101 mEq/L (ref 96–112)
GFR calc Af Amer: 52 mL/min — ABNORMAL LOW (ref >90)
GFR calc non Af Amer: 45 mL/min — ABNORMAL LOW (ref >90)
Glucose, Bld: 172 mg/dL — ABNORMAL HIGH (ref 70–99)
Potassium: 3.7 mEq/L (ref 3.5–5.1)
Sodium: 140 mEq/L (ref 135–145)
Total Bilirubin: 0.5 mg/dL (ref 0.3–1.2)

## 2011-12-30 LAB — CBC
HCT: 48.4 % (ref 39.0–52.0)
Hemoglobin: 16.4 g/dL (ref 13.0–17.0)
RBC: 5.89 MIL/uL — ABNORMAL HIGH (ref 4.22–5.81)

## 2011-12-30 LAB — RAPID URINE DRUG SCREEN, HOSP PERFORMED
Barbiturates: NOT DETECTED
Benzodiazepines: NOT DETECTED

## 2011-12-30 MED ORDER — ACETAMINOPHEN 325 MG PO TABS: 650.0000 mg | ORAL_TABLET | ORAL | Status: DC | PRN

## 2011-12-30 MED ORDER — IBUPROFEN 200 MG PO TABS
400.0000 mg | ORAL_TABLET | Freq: Three times a day (TID) | ORAL | Status: DC | PRN
  Administered 2012-01-01: 400 mg via ORAL
  Filled 2011-12-30: qty 2

## 2011-12-30 MED ORDER — ZOLPIDEM TARTRATE 5 MG PO TABS
5.0000 mg | ORAL_TABLET | Freq: Every evening | ORAL | Status: DC | PRN
  Administered 2012-01-01 – 2012-01-03 (×3): 5 mg via ORAL
  Filled 2011-12-30 (×3): qty 1

## 2011-12-30 MED ORDER — LORAZEPAM 1 MG PO TABS
1.0000 mg | ORAL_TABLET | Freq: Three times a day (TID) | ORAL | Status: DC | PRN
  Administered 2012-01-01: 1 mg via ORAL
  Filled 2011-12-30: qty 1

## 2011-12-30 MED ORDER — NICOTINE 21 MG/24HR TD PT24: 21.0000 mg | MEDICATED_PATCH | Freq: Every day | TRANSDERMAL | Status: DC | PRN

## 2011-12-30 MED ORDER — ALUM & MAG HYDROXIDE-SIMETH 200-200-20 MG/5ML PO SUSP: 30.0000 mL | ORAL | Status: DC | PRN

## 2011-12-30 MED ORDER — ZIPRASIDONE MESYLATE 20 MG IM SOLR
20.0000 mg | Freq: Once | INTRAMUSCULAR | Status: AC
  Administered 2011-12-30: 20 mg via INTRAMUSCULAR
  Filled 2011-12-30: qty 20

## 2011-12-30 MED ORDER — ONDANSETRON HCL 4 MG PO TABS: 4.0000 mg | ORAL_TABLET | Freq: Three times a day (TID) | ORAL | Status: DC | PRN

## 2011-12-30 NOTE — ED Notes (Signed)
Pt arrived to room, drowsy and went to bed immediately. Pt does not wish to talk to staff. No s/s of distress noted. Q 15 minute rounds initiated for safety.
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
## 2011-12-31 MED ORDER — QUETIAPINE FUMARATE ER 400 MG PO TB24
400.0000 mg | ORAL_TABLET | Freq: Every day | ORAL | Status: DC
  Administered 2012-01-01 – 2012-01-03 (×4): 400 mg via ORAL
  Filled 2011-12-31 (×7): qty 1

## 2011-12-31 MED ORDER — ZIPRASIDONE MESYLATE 20 MG IM SOLR
20.0000 mg | Freq: Once | INTRAMUSCULAR | Status: AC
  Administered 2011-12-31: 20 mg via INTRAMUSCULAR
  Filled 2011-12-31: qty 20

## 2011-12-31 MED ORDER — LORAZEPAM 2 MG/ML IJ SOLN
2.0000 mg | Freq: Once | INTRAMUSCULAR | Status: DC
Start: 2011-12-31 — End: 2012-01-04
  Filled 2011-12-31 (×2): qty 1

## 2011-12-31 MED ORDER — HALOPERIDOL LACTATE 5 MG/ML IJ SOLN: 5.0000 mg | Freq: Once | INTRAMUSCULAR | Status: DC

## 2011-12-31 MED ORDER — HALOPERIDOL LACTATE 5 MG/ML IJ SOLN
5.0000 mg | Freq: Once | INTRAMUSCULAR | Status: DC
Start: 2011-12-31 — End: 2012-01-04
  Filled 2011-12-31: qty 1

## 2011-12-31 NOTE — ED Notes (Signed)
Patient awake and punching and slapping the walls, waking others on the unit. RN attempted to redirect behaviors; pt drew his hand back as if to hit this RN. Pt then began to scream loudly stating "I don't care if I die! God-damnit!". Pt unable to be redirected. Dr. Lynelle Doctor notified; new orders received. [employer] and security in room to assist staff.

## 2011-12-31 NOTE — ED Notes (Signed)
Pt continues to escalate and repeating, "can I kill myself" while laughing, Dr. Lorenso Courier aware, [employer] and Security present, awaiting further orders from MD.

## 2011-12-31 NOTE — ED Notes (Signed)
Pt now awake, ate breakfast but has been pacing in room, intermittently hitting walls, yelling and laughing. [employer] outside room. This nurse faxed info and called to initiate telepsych at 1040, will monitor.

## 2011-12-31 NOTE — BH Assessment (Signed)
[facility] Assessment Progress Note      Consulted with Bernerd Limbo re referral and he has been denied as he is MR and this is exclusionary criteria for the unit.

## 2011-12-31 NOTE — ED Notes (Signed)
Patient currently asleep; no s/s of distress noted. Respirations regular and unlabored.

## 2011-12-31 NOTE — ED Notes (Signed)
Telepsych MD called for report prior to telepsych; however, pt would not talk to MD, awaiting recommendations.

## 2011-12-31 NOTE — ED Notes (Addendum)
Pt continues to pace but is quiet, awaiting telepsych, will monitor.

## 2011-12-31 NOTE — ED Notes (Signed)
Pt continues to yell, unable to be redirected. Pt responding to internal stimuli, laughing inappropriately to himself.
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
## 2011-12-31 NOTE — ED Notes (Signed)
Pt's sister and legal guardian, Eulice Rutledge, called to check on pt, sister reports that pt has mental retardation.

## 2011-12-31 NOTE — ED Notes (Signed)
Psychiatrist for the Telepsych evaluation called unit asking about patient. RN informed MD that pt was asleep after receiving Geodon. MD requests that staff call for new evaluation after patient more alert.

## 2011-12-31 NOTE — ED Provider Notes (Addendum)
0715.  Pt resting in bed, NAD.  Last vital signs were taken as he was receiving geodon secondary to agitation.  Will have them repeated now as he did have an elevate HR and BP.  The telepsych provider attempted to perform an evaluation but was unable to secondary to Mr. Noffke agitation.  Will monitor closely and follow-up recommendations as available.   Tobin Chad, MD 12/31/11 1610  1105.  Called to the bedside.  Pt is pacing the room, clapping, and yelling.  He is not easily redirected.  Will administer IM ativan and haldol. Will reassess.  Tobin Chad, MD 12/31/11 1112  1455.  Pt has been evaluated by Dr. Berlin Hun.  She recommends inpt placement for further evaluation.  Will start seroquel per her recommendations also.  Tobin Chad, MD 12/31/11 1500

## 2012-01-01 ENCOUNTER — Emergency Department (HOSPITAL_COMMUNITY): Payer: Medicare Other

## 2012-01-01 LAB — CBC WITH DIFFERENTIAL/PLATELET
Eosinophils Absolute: 0.1 10*3/uL (ref 0.0–0.7)
Hemoglobin: 15.2 g/dL (ref 13.0–17.0)
Lymphocytes Relative: 24 % (ref 12–46)
Lymphs Abs: 1.2 10*3/uL (ref 0.7–4.0)
Neutro Abs: 3.1 10*3/uL (ref 1.7–7.7)
Neutrophils Relative %: 62 % (ref 43–77)
Platelets: 307 10*3/uL (ref 150–400)
RBC: 5.51 MIL/uL (ref 4.22–5.81)
WBC: 5 10*3/uL (ref 4.0–10.5)

## 2012-01-01 LAB — BASIC METABOLIC PANEL
CO2: 24 mEq/L (ref 19–32)
Calcium: 9 mg/dL (ref 8.4–10.5)
Chloride: 103 mEq/L (ref 96–112)
Glucose, Bld: 112 mg/dL — ABNORMAL HIGH (ref 70–99)
Sodium: 138 mEq/L (ref 135–145)

## 2012-01-01 LAB — COMPREHENSIVE METABOLIC PANEL
ALT: 31 U/L (ref 0–53)
AST: 40 U/L — ABNORMAL HIGH (ref 0–37)
Alkaline Phosphatase: 98 U/L (ref 39–117)
CO2: 26 mEq/L (ref 19–32)
Calcium: 9.2 mg/dL (ref 8.4–10.5)
GFR calc non Af Amer: 51 mL/min — ABNORMAL LOW (ref >90)
Potassium: 3.6 mEq/L (ref 3.5–5.1)
Sodium: 137 mEq/L (ref 135–145)

## 2012-01-01 LAB — URINALYSIS, ROUTINE W REFLEX MICROSCOPIC
Glucose, UA: NEGATIVE mg/dL
Leukocytes, UA: NEGATIVE
Protein, ur: NEGATIVE mg/dL
Specific Gravity, Urine: 1.015 (ref 1.005–1.030)
pH: 7 (ref 5.0–8.0)

## 2012-01-01 LAB — POCT I-STAT TROPONIN I

## 2012-01-01 MED ORDER — METFORMIN HCL 500 MG PO TABS
500.0000 mg | ORAL_TABLET | Freq: Every day | ORAL | Status: DC
  Administered 2012-01-01 – 2012-01-02 (×2): 500 mg via ORAL
  Filled 2012-01-01 (×2): qty 1

## 2012-01-01 MED ORDER — AMOXICILLIN-POT CLAVULANATE 875-125 MG PO TABS: 1.0000 | ORAL_TABLET | Freq: Once | ORAL | Status: DC

## 2012-01-01 MED ORDER — PANTOPRAZOLE SODIUM 40 MG PO TBEC
40.0000 mg | DELAYED_RELEASE_TABLET | Freq: Every day | ORAL | Status: DC
  Administered 2012-01-01 – 2012-01-04 (×4): 40 mg via ORAL
  Filled 2012-01-01 (×4): qty 1

## 2012-01-01 MED ORDER — AMOXICILLIN-POT CLAVULANATE 875-125 MG PO TABS
1.0000 | ORAL_TABLET | Freq: Two times a day (BID) | ORAL | Status: DC
  Administered 2012-01-01 – 2012-01-04 (×7): 1 via ORAL
  Filled 2012-01-01 (×7): qty 1

## 2012-01-01 MED ORDER — SODIUM CHLORIDE 0.9 % IV BOLUS (SEPSIS)
1000.0000 mL | Freq: Once | INTRAVENOUS | Status: AC
  Administered 2012-01-01: 1000 mL via INTRAVENOUS

## 2012-01-01 MED ORDER — CHOLECALCIFEROL 10 MCG (400 UNIT) PO TABS
400.0000 [IU] | ORAL_TABLET | Freq: Every day | ORAL | Status: DC
  Administered 2012-01-01 – 2012-01-04 (×4): 400 [IU] via ORAL
  Filled 2012-01-01 (×4): qty 1

## 2012-01-01 MED ORDER — SIMVASTATIN 20 MG PO TABS
20.0000 mg | ORAL_TABLET | Freq: Every day | ORAL | Status: DC
  Administered 2012-01-01 – 2012-01-03 (×3): 20 mg via ORAL
  Filled 2012-01-01 (×4): qty 1

## 2012-01-01 MED ORDER — LORAZEPAM 2 MG/ML IJ SOLN
1.0000 mg | Freq: Once | INTRAMUSCULAR | Status: AC
  Administered 2012-01-01: 2 mg via INTRAVENOUS

## 2012-01-01 MED ORDER — QUETIAPINE FUMARATE 100 MG PO TABS
100.0000 mg | ORAL_TABLET | Freq: Two times a day (BID) | ORAL | Status: DC
  Administered 2012-01-01: 100 mg via ORAL
  Filled 2012-01-01: qty 1

## 2012-01-01 MED ORDER — LUBIPROSTONE 24 MCG PO CAPS
24.0000 ug | ORAL_CAPSULE | Freq: Two times a day (BID) | ORAL | Status: DC
  Administered 2012-01-01 – 2012-01-04 (×7): 24 ug via ORAL
  Filled 2012-01-01 (×9): qty 1

## 2012-01-01 MED ORDER — ASPIRIN 81 MG PO CHEW
81.0000 mg | CHEWABLE_TABLET | Freq: Every day | ORAL | Status: DC
  Administered 2012-01-01 – 2012-01-04 (×4): 81 mg via ORAL
  Filled 2012-01-01 (×4): qty 1

## 2012-01-01 MED ORDER — AMLODIPINE BESYLATE 5 MG PO TABS
5.0000 mg | ORAL_TABLET | Freq: Every day | ORAL | Status: DC
  Administered 2012-01-01 – 2012-01-04 (×4): 5 mg via ORAL
  Filled 2012-01-01 (×5): qty 1

## 2012-01-01 MED ORDER — SODIUM CHLORIDE 0.9 % IV SOLN
INTRAVENOUS | Status: DC
  Administered 2012-01-01: 14:00:00 via INTRAVENOUS

## 2012-01-01 NOTE — ED Notes (Signed)
Visitor into see 

## 2012-01-01 NOTE — ED Notes (Signed)
Attempting to notify EDP of elevated heart rate, charge nurse aware and will transfer pt to room 9 for further eval

## 2012-01-01 NOTE — Progress Notes (Signed)
CSW spoke with [facility] regarding [employer]. Patient is not in the system, and is not in the database for having been diagnosed with MR.   Catha Gosselin, LCSWA  [phone] 01/01/2012 10:28pm

## 2012-01-01 NOTE — ED Notes (Addendum)
Pt is HOH wears bilat hearing aids Pt can read lips ,but must be face to face

## 2012-01-01 NOTE — BHH Counselor (Signed)
Pt declined at [facility] by Verne Spurr, PA due to exclusionary criteria, as pt is MR. Contacted [facility] and communicated decline to T. Simmons.

## 2012-01-01 NOTE — ED Provider Notes (Addendum)
Med rec done. Vitals stable.awaiting placement. Remains under ivc  Toy Baker, MD 01/01/12 0742  11:20 AM Patient sent from psychiatric ER do to increased heart rate while at rest. Patient denies any symptoms. His EKG shows a sinus tachycardia. We'll evaluate  Toy Baker, MD 01/01/12 1124  12:45 PM Patient's labs and x-rays reviewed. Suspect that he is slightly dehydrated as cause of his tachycardia Chest x-ray noted and raise the possibility of early bronchitis or reactive airway disease. Will place on empiric antibiotics  Toy Baker, MD 01/01/12 1245  2:06 PM Patient's heart is improved with IV fluids. He'll be sent back to the psych-ed  Toy Baker, MD 01/01/12 1407

## 2012-01-01 NOTE — ED Notes (Signed)
Pt up to the bathroom, ambulatory w/o difficulty

## 2012-01-02 LAB — GLUCOSE, CAPILLARY: Glucose-Capillary: 114 mg/dL — ABNORMAL HIGH (ref 70–99)

## 2012-01-02 MED ORDER — INSULIN ASPART 100 UNIT/ML ~~LOC~~ SOLN: 0.0000 [IU] | Freq: Three times a day (TID) | SUBCUTANEOUS | Status: DC

## 2012-01-02 NOTE — ED Notes (Signed)
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 CSW explained to Ms. Mcnease that this documentation is needed in order to find placement for patient. Ms. Howerter reported that she will contact patient's past group home and have the owner fax the appropriate information if he/she has it. Disposition pending.   Janann Colonel., MSW, [facility] Weekend ED Clinical Social Worker [phone]

## 2012-01-02 NOTE — Progress Notes (Signed)
PHARMACIST - PHYSICIAN COMMUNICATION DR:  Patria Mane CONCERNING:  METFORMIN SAFE ADMINISTRATION POLICY  RECOMMENDATION: Metformin has been placed on DISCONTINUE (rejected order) STATUS and should be reordered only after any of the conditions below are ruled out.  Current safety recommendations include avoiding metformin for a minimum of 48 hours after the patient's exposure to intravenous contrast media.  DESCRIPTION:  The Pharmacy Committee has adopted a policy that restricts the use of metformin in hospitalized patients until all the contraindications to administration have been ruled out. Specific contraindications are: [x]  Serum creatinine ? 1.5 for males []  Serum creatinine ? 1.4 for females []  Shock, acute MI, sepsis, hypoxemia, dehydration []  Planned administration of intravenous iodinated contrast media []  Heart Failure patients with low EF []  Acute or chronic metabolic acidosis (including DKA)    Thank you,  Loralee Pacas, PharmD, [facility] [phone] 01/02/2012 12:10 PM

## 2012-01-02 NOTE — ED Notes (Signed)
2 bags in locker# 38--pt's hearing aids given to pt

## 2012-01-02 NOTE — ED Provider Notes (Signed)
7:35 AM Filed Vitals:   01/02/12 0653  BP: 118/68  Pulse: 97  Temp: 97.4 F (36.3 C)  Resp: 16  The pt has remained stable in the ER and continues to await psychiatric disposition at this time.   Telepsych complete, awaiting placement.    Lyanne Co, MD 01/02/12 [phone]

## 2012-01-02 NOTE — ED Notes (Signed)
CSW telephoned pt's guardian Roque Schill to follow up and attempt to obtain MR documentation, which would be needed to find placement at this time. CSW left voicemail requesting a follow up phone call.  Janann Colonel., MSW, [facility] Weekend ED Clinical Social Worker [phone]

## 2012-01-03 LAB — GLUCOSE, CAPILLARY: Glucose-Capillary: 100 mg/dL — ABNORMAL HIGH (ref 70–99)

## 2012-01-03 LAB — CREATININE, SERUM
Creatinine, Ser: 1.62 mg/dL — ABNORMAL HIGH (ref 0.50–1.35)
GFR calc Af Amer: 55 mL/min — ABNORMAL LOW (ref >90)

## 2012-01-03 NOTE — ED Provider Notes (Signed)
8:05 AM Patient sleeping during AM rounds.  Per RN, no acute events overnight.  VSS   RN notes that Creatinine is increasing.  A chart review demonstrates that today's result is within the patient's historical range.  He has diabetes.  Patient is awaiting placement.  Gerhard Munch, MD 01/03/12 [phone]

## 2012-01-03 NOTE — ED Notes (Signed)
Care giver from [place]- Larkin Ina- returned call from social worker yesterday.  Contact # [phone].  Greg aware.

## 2012-01-03 NOTE — ED Notes (Signed)
CSW telephoned Larkin Ina with [facility] to request MR documentation in order to proceed with bed finding for treatment. CSW left a voicemail requesting Ms. White to contact CSW/ACT as soon as possible.  Janann Colonel., MSW, [facility] Weekend ED Clinical Social Worker [phone]

## 2012-01-03 NOTE — ED Notes (Signed)
Up to the bathroom 

## 2012-01-04 LAB — GLUCOSE, CAPILLARY: Glucose-Capillary: 108 mg/dL — ABNORMAL HIGH (ref 70–99)

## 2012-01-04 MED ORDER — AMOXICILLIN-POT CLAVULANATE 875-125 MG PO TABS: 1.0000 | ORAL_TABLET | Freq: Two times a day (BID) | ORAL | Status: DC

## 2012-01-04 MED ORDER — QUETIAPINE FUMARATE ER 400 MG PO TB24: 400.0000 mg | ORAL_TABLET | Freq: Every day | ORAL | Status: DC

## 2012-01-04 MED ORDER — AMLODIPINE BESYLATE 5 MG PO TABS: 5.0000 mg | ORAL_TABLET | Freq: Every day | ORAL | Status: DC

## 2012-01-04 NOTE — BHH Counselor (Signed)
D/C back to Group Home, per telepsych. Larkin Ina (group home staff) to be contacted for pick up ([facility]) [phone]. Patient's sister (legal guardian) was also physically here in the ED and has agreed to take patient back to group home. Contacted group home and spoke to [facility] regarding discharge and confirmed that the group home is willing to take patient back. Pt given mental health community referrals. Patients sister and and group home staff agreed to follow up with referrals given.

## 2012-01-04 NOTE — ED Notes (Signed)
Pt discharged back to group home with sister.  Instructions reviewed with pt's sister who is his POA. Pt denies SI/HI orA/VH.

## 2012-01-04 NOTE — BHH Counselor (Signed)
Contacted patients outpatient provider-Daniel McDuffie with [facility] [phone] to inquire about patients MR status. Mr. Shawn Baird was not available; left a msg requesting he return call.

## 2012-01-04 NOTE — BHH Counselor (Signed)
Spoke to group home provider-Dara White at  this am whom sts that patients family will come to visit. Also sts that they will bring documentation regarding patients mental health history and diagnosis.

## 2012-01-04 NOTE — ED Provider Notes (Addendum)
The patient is sleeping this morning. Vital signs are stable. He is awaiting placement.  Celene Kras, MD 01/04/12 0809  Pt was assessed by Dr Leretha Pol, psychiatry.  Pt is no longer delusional, psychotic.  He denies SI, HI.  He has been stabilized while in the ed (113 hours).  He is safe for discharge and appropriate outpatient follow up.  Celene Kras, MD 01/04/12 [phone]

## 2012-01-14 ENCOUNTER — Inpatient Hospital Stay (HOSPITAL_COMMUNITY)
Admission: EM | Admit: 2012-01-14 | Discharge: 2012-01-16 | DRG: 074 | Disposition: A | Discharge status: Home / Self Care | Payer: Medicare Other | Attending: Internal Medicine | Admitting: Internal Medicine

## 2012-01-14 ENCOUNTER — Emergency Department (HOSPITAL_COMMUNITY): Payer: Medicare Other

## 2012-01-14 ENCOUNTER — Encounter (HOSPITAL_COMMUNITY): Payer: Self-pay | Admitting: Emergency Medicine

## 2012-01-14 DIAGNOSIS — R112 Nausea with vomiting, unspecified: Secondary | ICD-10-CM | POA: Diagnosis present

## 2012-01-14 DIAGNOSIS — F79 Unspecified intellectual disabilities: Secondary | ICD-10-CM | POA: Diagnosis present

## 2012-01-14 DIAGNOSIS — F259 Schizoaffective disorder, unspecified: Secondary | ICD-10-CM | POA: Diagnosis present

## 2012-01-14 DIAGNOSIS — R109 Unspecified abdominal pain: Secondary | ICD-10-CM | POA: Diagnosis present

## 2012-01-14 DIAGNOSIS — I10 Essential (primary) hypertension: Secondary | ICD-10-CM | POA: Diagnosis present

## 2012-01-14 DIAGNOSIS — K3184 Gastroparesis: Secondary | ICD-10-CM | POA: Diagnosis present

## 2012-01-14 DIAGNOSIS — K859 Acute pancreatitis without necrosis or infection, unspecified: Secondary | ICD-10-CM

## 2012-01-14 DIAGNOSIS — K5909 Other constipation: Secondary | ICD-10-CM

## 2012-01-14 DIAGNOSIS — N2889 Other specified disorders of kidney and ureter: Secondary | ICD-10-CM | POA: Diagnosis present

## 2012-01-14 DIAGNOSIS — E785 Hyperlipidemia, unspecified: Secondary | ICD-10-CM | POA: Diagnosis present

## 2012-01-14 DIAGNOSIS — E119 Type 2 diabetes mellitus without complications: Secondary | ICD-10-CM | POA: Diagnosis present

## 2012-01-14 DIAGNOSIS — K219 Gastro-esophageal reflux disease without esophagitis: Secondary | ICD-10-CM | POA: Diagnosis present

## 2012-01-14 DIAGNOSIS — A088 Other specified intestinal infections: Secondary | ICD-10-CM | POA: Diagnosis present

## 2012-01-14 DIAGNOSIS — E1149 Type 2 diabetes mellitus with other diabetic neurological complication: Principal | ICD-10-CM | POA: Diagnosis present

## 2012-01-14 MED ORDER — SODIUM CHLORIDE 0.9 % IV SOLN
Freq: Once | INTRAVENOUS | Status: AC
  Administered 2012-01-14: 20 mL/h via INTRAVENOUS

## 2012-01-14 MED ORDER — HYDROMORPHONE HCL PF 1 MG/ML IJ SOLN
1.0000 mg | Freq: Once | INTRAMUSCULAR | Status: AC
  Administered 2012-01-15: 1 mg via INTRAVENOUS
  Filled 2012-01-14: qty 1

## 2012-01-14 MED ORDER — SODIUM CHLORIDE 0.9 % IV SOLN
INTRAVENOUS | Status: DC
  Administered 2012-01-15: 125 mL/h via INTRAVENOUS

## 2012-01-14 MED ORDER — HYDROMORPHONE HCL PF 1 MG/ML IJ SOLN
1.0000 mg | Freq: Once | INTRAMUSCULAR | Status: AC
  Administered 2012-01-14: 1 mg via INTRAVENOUS
  Filled 2012-01-14: qty 1

## 2012-01-14 MED ORDER — ONDANSETRON HCL 4 MG/2ML IJ SOLN
4.0000 mg | Freq: Once | INTRAMUSCULAR | Status: AC
  Administered 2012-01-14: 4 mg via INTRAVENOUS
  Filled 2012-01-14: qty 2

## 2012-01-14 NOTE — ED Notes (Signed)
NWG:[id number]<ZH> Expected date:01/14/12<BR> Expected time:11:18 PM<BR> Means of arrival:Ambulance<BR> Comments:<BR> abd pain

## 2012-01-14 NOTE — ED Notes (Signed)
Pt to radiology.

## 2012-01-14 NOTE — ED Notes (Signed)
Per EMS, pt went out of facility for a mental health consult and consumed spicy food.  Since then pt has been diaphoretic and crying c/o abdominal pain 10/10.  Pt guarding abdomen, very distended and tight. Pt has hx of pancreatitis.  Pt crying and laughing at the same time at present.

## 2012-01-14 NOTE — ED Notes (Signed)
Pt vomiting copious amounts of emesis.  Pt given Zofran and emesis continues

## 2012-01-14 NOTE — ED Notes (Signed)
MD at bedside. 

## 2012-01-14 NOTE — ED Provider Notes (Addendum)
History     CSN: [id number]  Arrival date & time 01/14/12  2319   First MD Initiated Contact with Patient 01/14/12 2327      Chief Complaint  Patient presents with  . Abdominal Pain    (Consider location/radiation/quality/duration/timing/severity/associated sxs/prior treatment) HPI Level 5 Caveat: mental retardation. This is a 51 year old male with a history of pancreatitis and mental retardation. EMS reports he went out of his facility for mental health appointment and consumed "spicy food"; time this occurred was not given. Since then the patient has developed severe abdominal pain with diaphoresis and obvious distress due to pain. He was noted to have guarding of the abdomen with significant diffuse tenderness. He has been vomiting and vomited several times on arrival in the ED. He was given Zofran 4 mg by his nurse per protocol. His nurse reports his emesis appeared to be "chicken noodle soup".   Past Medical History  Diagnosis Date  . Diabetes mellitus   . Anxiety   . Hypertension   . Hyperlipemia   . Mental disorder   . Schizoaffective disorder   . Impulsive control disorder  . Pancreatitis   . Otitis media   . Cancer   . HOH (hard of hearing)     History reviewed. No pertinent past surgical history.  History reviewed. No pertinent family history.  History  Substance Use Topics  . Smoking status: Never Smoker   . Smokeless tobacco: Not on file  . Alcohol Use: No      Review of Systems  Unable to perform ROS   Allergies  Review of patient's allergies indicates no known allergies.  Home Medications   Current Outpatient Rx  Name  Route  Sig  Dispense  Refill  . AMLODIPINE BESYLATE 5 MG PO TABS   Oral   Take 1 tablet (5 mg total) by mouth daily.   30 tablet   0   . ASPIRIN 81 MG PO CHEW   Oral   Chew 81 mg by mouth daily.         Marland Kitchen LORATADINE 10 MG PO TABS   Oral   Take 10 mg by mouth daily.         . LUBIPROSTONE 24 MCG PO CAPS    Oral   Take 24 mcg by mouth 2 (two) times daily with a meal.         . METFORMIN HCL ER 500 MG PO TB24   Oral   Take 500 mg by mouth daily with breakfast.         . OMEPRAZOLE 20 MG PO CPDR   Oral   Take 20 mg by mouth daily.         Marland Kitchen POLYETHYLENE GLYCOL 3350 PO PACK   Oral   Take 17 g by mouth every morning.         Marland Kitchen PRAVASTATIN SODIUM 20 MG PO TABS   Oral   Take 20 mg by mouth at bedtime.          Marland Kitchen QUETIAPINE FUMARATE ER 400 MG PO TB24   Oral   Take 1 tablet (400 mg total) by mouth at bedtime.   30 tablet   0   . VITAMIN B-12 1000 MCG PO TABS   Oral   Take 1,000 mcg by mouth daily.           BP 106/72  Pulse 100  Temp 99.1 F (37.3 C) (Oral)  Resp 18  Ht 5\' 9"  (1.753 m)  SpO2 96%  Physical Exam General: Well-developed, well-nourished male in obvious discomfort; appearance consistent with age of record HENT: normocephalic, atraumatic Eyes: pupils equal round and reactive to light; extraocular muscles intact; conjunctival injection bilaterally Neck: supple Heart: regular rate and rhythm Lungs: clear to auscultation bilaterally Abdomen: Distended; guarding; diffuse tenderness; decreased bowel sounds Extremities: No deformity; full range of motion; pulses normal; no edema Neurologic: Awake, alert; motor function intact in all extremities and symmetric; no facial droop Skin: Diaphoretic Psychiatric: Agitated; anxious    ED Course  Procedures (including critical care time)     MDM   Nursing notes and vitals signs, including pulse oximetry, reviewed.  Summary of this visit's results, reviewed by myself:  Labs:  Results for orders placed during the hospital encounter of 01/14/12 (from the past 24 hour(s))  CBC WITH DIFFERENTIAL     Status: Abnormal   Collection Time   01/14/12 11:30 PM      Component Value Range   WBC 8.6  4.0 - 10.5 K/uL   RBC 5.94 (*) 4.22 - 5.81 MIL/uL   Hemoglobin 16.9  13.0 - 17.0 g/dL   HCT 16.1  09.6 - 04.5 %    MCV 83.3  78.0 - 100.0 fL   MCH 28.5  26.0 - 34.0 pg   MCHC 34.1  30.0 - 36.0 g/dL   RDW 40.9  81.1 - 91.4 %   Platelets 333  150 - 400 K/uL   Neutrophils Relative 59  43 - 77 %   Neutro Abs 5.1  1.7 - 7.7 K/uL   Lymphocytes Relative 27  12 - 46 %   Lymphs Abs 2.4  0.7 - 4.0 K/uL   Monocytes Relative 9  3 - 12 %   Monocytes Absolute 0.8  0.1 - 1.0 K/uL   Eosinophils Relative 3  0 - 5 %   Eosinophils Absolute 0.3  0.0 - 0.7 K/uL   Basophils Relative 1  0 - 1 %   Basophils Absolute 0.1  0.0 - 0.1 K/uL  LIPASE, BLOOD     Status: Abnormal   Collection Time   01/14/12 11:30 PM      Component Value Range   Lipase 91 (*) 11 - 59 U/L  COMPREHENSIVE METABOLIC PANEL     Status: Abnormal   Collection Time   01/14/12 11:30 PM      Component Value Range   Sodium 135  135 - 145 mEq/L   Potassium 3.6  3.5 - 5.1 mEq/L   Chloride 98  96 - 112 mEq/L   CO2 25  19 - 32 mEq/L   Glucose, Bld 140 (*) 70 - 99 mg/dL   BUN 12  6 - 23 mg/dL   Creatinine, Ser 7.82 (*) 0.50 - 1.35 mg/dL   Calcium 9.3  8.4 - 95.6 mg/dL   Total Protein 8.2  6.0 - 8.3 g/dL   Albumin 4.1  3.5 - 5.2 g/dL   AST 30  0 - 37 U/L   ALT 36  0 - 53 U/L   Alkaline Phosphatase 108  39 - 117 U/L   Total Bilirubin 0.3  0.3 - 1.2 mg/dL   GFR calc non Af Amer 51 (*) >90 mL/min   GFR calc Af Amer 60 (*) >90 mL/min  LACTIC ACID, PLASMA     Status: Normal   Collection Time   01/14/12 11:30 PM      Component Value Range   Lactic Acid, Venous 1.7  0.5 - 2.2 mmol/L  URINALYSIS, ROUTINE W REFLEX MICROSCOPIC     Status: Abnormal   Collection Time   01/15/12  1:09 AM      Component Value Range   Color, Urine YELLOW  YELLOW   APPearance CLEAR  CLEAR   Specific Gravity, Urine 1.022  1.005 - 1.030   pH 5.5  5.0 - 8.0   Glucose, UA NEGATIVE  NEGATIVE mg/dL   Hgb urine dipstick MODERATE (*) NEGATIVE   Bilirubin Urine NEGATIVE  NEGATIVE   Ketones, ur 15 (*) NEGATIVE mg/dL   Protein, ur NEGATIVE  NEGATIVE mg/dL   Urobilinogen, UA 1.0   0.0 - 1.0 mg/dL   Nitrite NEGATIVE  NEGATIVE   Leukocytes, UA NEGATIVE  NEGATIVE  URINE MICROSCOPIC-ADD ON     Status: Abnormal   Collection Time   01/15/12  1:09 AM      Component Value Range   Squamous Epithelial / LPF FEW (*) RARE   RBC / HPF 11-20  <3 RBC/hpf   Bacteria, UA FEW (*) RARE    Imaging Studies: Ct Abdomen Pelvis Wo Contrast  01/15/2012  *RADIOLOGY REPORT*  Clinical Data: Abdominal pain  CT ABDOMEN AND PELVIS WITHOUT CONTRAST  Technique:  Multidetector CT imaging of the abdomen and pelvis was performed following the standard protocol without intravenous contrast.  Comparison: 01/15/2012 radiograph, 03/26/2011 CT  Findings: Cardiomegaly.  Motion degrades evaluation of the lung bases with mild left lung base opacity, favor atelectasis. Distal esophageal wall thickening.  Organ abnormality/lesion detection is limited in the absence of intravenous contrast. Within this limitation, diffuse low attenuation of the liver suggests fatty infiltration.  Unremarkable biliary system, spleen, pancreas, adrenal glands.  Tiny left renal calcific density corresponds to the location of a cystic focus on the prior contrast enhanced study, may reflect calcification within a calyceal diverticulum or renal cyst.  No hydronephrosis or hydroureter.  Colonic diverticulosis.  No definite evidence for colitis or diverticulitis.  Appendix not identified.  No right lower quadrant inflammation.  No bowel obstruction.  The stomach is distended.  No free intraperitoneal air or fluid.  No lymphadenopathy.  Normal caliber aorta with minimal scattered atherosclerotic disease.  Thin-walled bladder.  No acute osseous finding.  Multilevel degenerative change.  IMPRESSION: Distended stomach is nonspecific.  May reflect gastroparesis. Distal esophageal wall thickening can be seen in the setting of esophagitis or gastroesophageal reflux disease.  Normal nonenhanced appearance to the pancreas despite the elevated lipase.   Punctate interpolar left renal calcification may correspond to a stone within a calyceal diverticulum or cyst.  Recommend 42-month ultrasound follow-up.  Hepatic steatosis.   Original Report Authenticated By: Jearld Lesch, M.D.    Dg Abd Acute W/chest  01/15/2012  *RADIOLOGY REPORT*  Clinical Data: Abdominal pain, vomiting  ACUTE ABDOMEN SERIES (ABDOMEN 2 VIEW & CHEST 1 VIEW)  Comparison: Chest radiograph dated 01/01/2012.  Abdominal radiographs dated 08/22/2011.  Findings: Low lung volumes with vascular crowding.  No focal consolidation.  No pleural effusion or pneumothorax.  Cardiomediastinal silhouette is within normal limits.  Gastric distension.  Nonspecific bowel gas pattern without disproportionate small bowel dilatation to suggest small bowel obstruction.  No evidence of free air under the diaphragm on the upright view.  Visualized osseous structures are within normal limits.  IMPRESSION: No evidence of acute cardiopulmonary disease.  No evidence of small bowel obstruction or free air.  Gastric distension.   Original Report Authenticated By: Charline Bills, M.D.    EKG Interpretation:  Date & Time: 01/14/2012 11:41 PM  Rate: 104  Rhythm: sinus tachycardia  QRS Axis: normal  Intervals: normal  ST/T Wave abnormalities: normal  Conduction Disutrbances:none  Narrative Interpretation: Qs in lead III  Old EKG Reviewed: unchanged  2:52 AM Patient appears much more comfortable after prolonged IV. He will relax his abdominal muscles now although he tenses them up when palpated.   [facility] to admit. CT shows no evidence of acute surgical etiology.        Hanley Seamen, MD 01/15/12 0254  Hanley Seamen, MD 01/15/12 [phone]

## 2012-01-15 ENCOUNTER — Emergency Department (HOSPITAL_COMMUNITY): Payer: Medicare Other

## 2012-01-15 DIAGNOSIS — E119 Type 2 diabetes mellitus without complications: Secondary | ICD-10-CM | POA: Diagnosis present

## 2012-01-15 DIAGNOSIS — R109 Unspecified abdominal pain: Secondary | ICD-10-CM | POA: Diagnosis present

## 2012-01-15 DIAGNOSIS — K859 Acute pancreatitis without necrosis or infection, unspecified: Secondary | ICD-10-CM

## 2012-01-15 DIAGNOSIS — F259 Schizoaffective disorder, unspecified: Secondary | ICD-10-CM | POA: Diagnosis present

## 2012-01-15 DIAGNOSIS — E785 Hyperlipidemia, unspecified: Secondary | ICD-10-CM | POA: Diagnosis present

## 2012-01-15 DIAGNOSIS — I10 Essential (primary) hypertension: Secondary | ICD-10-CM | POA: Diagnosis present

## 2012-01-15 DIAGNOSIS — K5909 Other constipation: Secondary | ICD-10-CM | POA: Diagnosis present

## 2012-01-15 DIAGNOSIS — K219 Gastro-esophageal reflux disease without esophagitis: Secondary | ICD-10-CM | POA: Diagnosis present

## 2012-01-15 DIAGNOSIS — N2889 Other specified disorders of kidney and ureter: Secondary | ICD-10-CM | POA: Diagnosis present

## 2012-01-15 LAB — COMPREHENSIVE METABOLIC PANEL
AST: 30 U/L (ref 0–37)
Albumin: 4.1 g/dL (ref 3.5–5.2)
Alkaline Phosphatase: 108 U/L (ref 39–117)
BUN: 12 mg/dL (ref 6–23)
Chloride: 98 mEq/L (ref 96–112)
Potassium: 3.6 mEq/L (ref 3.5–5.1)
Total Bilirubin: 0.3 mg/dL (ref 0.3–1.2)

## 2012-01-15 LAB — URINALYSIS, ROUTINE W REFLEX MICROSCOPIC
Bilirubin Urine: NEGATIVE
Glucose, UA: NEGATIVE mg/dL
Ketones, ur: 15 mg/dL — AB
pH: 5.5 (ref 5.0–8.0)

## 2012-01-15 LAB — GLUCOSE, CAPILLARY: Glucose-Capillary: 137 mg/dL — ABNORMAL HIGH (ref 70–99)

## 2012-01-15 LAB — CBC WITH DIFFERENTIAL/PLATELET
Basophils Absolute: 0.1 10*3/uL (ref 0.0–0.1)
HCT: 49.5 % (ref 39.0–52.0)
Hemoglobin: 16.9 g/dL (ref 13.0–17.0)
Lymphocytes Relative: 27 % (ref 12–46)
Lymphs Abs: 2.4 10*3/uL (ref 0.7–4.0)
Monocytes Absolute: 0.8 10*3/uL (ref 0.1–1.0)
Monocytes Relative: 9 % (ref 3–12)
Neutro Abs: 5.1 10*3/uL (ref 1.7–7.7)
RBC: 5.94 MIL/uL — ABNORMAL HIGH (ref 4.22–5.81)
RDW: 13.9 % (ref 11.5–15.5)
WBC: 8.6 10*3/uL (ref 4.0–10.5)

## 2012-01-15 LAB — URINE MICROSCOPIC-ADD ON

## 2012-01-15 LAB — LACTIC ACID, PLASMA: Lactic Acid, Venous: 1.7 mmol/L (ref 0.5–2.2)

## 2012-01-15 MED ORDER — QUETIAPINE FUMARATE ER 400 MG PO TB24
400.0000 mg | ORAL_TABLET | Freq: Every day | ORAL | Status: DC
  Administered 2012-01-15: 400 mg via ORAL
  Filled 2012-01-15 (×2): qty 1

## 2012-01-15 MED ORDER — POTASSIUM CHLORIDE IN NACL 20-0.9 MEQ/L-% IV SOLN
INTRAVENOUS | Status: DC
  Administered 2012-01-15 – 2012-01-16 (×2): via INTRAVENOUS
  Filled 2012-01-15 (×3): qty 1000

## 2012-01-15 MED ORDER — ACETAMINOPHEN 325 MG PO TABS: 650.0000 mg | ORAL_TABLET | Freq: Four times a day (QID) | ORAL | Status: DC | PRN

## 2012-01-15 MED ORDER — AMLODIPINE BESYLATE 5 MG PO TABS
5.0000 mg | ORAL_TABLET | Freq: Every day | ORAL | Status: DC
Start: 2012-01-15 — End: 2012-01-16
  Administered 2012-01-15 – 2012-01-16 (×2): 5 mg via ORAL
  Filled 2012-01-15 (×2): qty 1

## 2012-01-15 MED ORDER — PANTOPRAZOLE SODIUM 40 MG PO TBEC
40.0000 mg | DELAYED_RELEASE_TABLET | Freq: Every day | ORAL | Status: DC
  Administered 2012-01-15 – 2012-01-16 (×2): 40 mg via ORAL
  Filled 2012-01-15 (×2): qty 1

## 2012-01-15 MED ORDER — INSULIN ASPART 100 UNIT/ML ~~LOC~~ SOLN
0.0000 [IU] | Freq: Three times a day (TID) | SUBCUTANEOUS | Status: DC
  Administered 2012-01-15: 3 [IU] via SUBCUTANEOUS

## 2012-01-15 MED ORDER — ACETAMINOPHEN 650 MG RE SUPP: 650.0000 mg | Freq: Four times a day (QID) | RECTAL | Status: DC | PRN

## 2012-01-15 MED ORDER — GI COCKTAIL ~~LOC~~
30.0000 mL | Freq: Three times a day (TID) | ORAL | Status: DC | PRN
  Filled 2012-01-15: qty 30

## 2012-01-15 MED ORDER — HYDROMORPHONE HCL PF 1 MG/ML IJ SOLN
1.0000 mg | Freq: Once | INTRAMUSCULAR | Status: AC
  Administered 2012-01-15: 1 mg via INTRAVENOUS
  Filled 2012-01-15: qty 1

## 2012-01-15 MED ORDER — INSULIN ASPART 100 UNIT/ML ~~LOC~~ SOLN: 0.0000 [IU] | Freq: Every day | SUBCUTANEOUS | Status: DC

## 2012-01-15 MED ORDER — SENNOSIDES-DOCUSATE SODIUM 8.6-50 MG PO TABS
1.0000 | ORAL_TABLET | Freq: Every evening | ORAL | Status: DC | PRN
  Filled 2012-01-15: qty 1

## 2012-01-15 MED ORDER — LUBIPROSTONE 24 MCG PO CAPS
24.0000 ug | ORAL_CAPSULE | Freq: Two times a day (BID) | ORAL | Status: DC
  Administered 2012-01-15 – 2012-01-16 (×3): 24 ug via ORAL
  Filled 2012-01-15 (×5): qty 1

## 2012-01-15 MED ORDER — METOCLOPRAMIDE HCL 5 MG/ML IJ SOLN
10.0000 mg | Freq: Once | INTRAMUSCULAR | Status: AC
  Administered 2012-01-15: 10 mg via INTRAVENOUS
  Filled 2012-01-15: qty 2

## 2012-01-15 MED ORDER — ZOLPIDEM TARTRATE 5 MG PO TABS: 5.0000 mg | ORAL_TABLET | Freq: Every evening | ORAL | Status: DC | PRN

## 2012-01-15 MED ORDER — HYDROCODONE-ACETAMINOPHEN 5-325 MG PO TABS: 1.0000 | ORAL_TABLET | ORAL | Status: DC | PRN

## 2012-01-15 MED ORDER — ONDANSETRON HCL 4 MG/2ML IJ SOLN: 4.0000 mg | Freq: Four times a day (QID) | INTRAMUSCULAR | Status: DC | PRN

## 2012-01-15 MED ORDER — GI COCKTAIL ~~LOC~~
30.0000 mL | Freq: Once | ORAL | Status: AC
  Administered 2012-01-15: 30 mL via ORAL
  Filled 2012-01-15: qty 30

## 2012-01-15 MED ORDER — METOCLOPRAMIDE HCL 5 MG PO TABS
5.0000 mg | ORAL_TABLET | Freq: Three times a day (TID) | ORAL | Status: DC
  Administered 2012-01-15 – 2012-01-16 (×3): 5 mg via ORAL
  Filled 2012-01-15 (×6): qty 1

## 2012-01-15 MED ORDER — HEPARIN SODIUM (PORCINE) 5000 UNIT/ML IJ SOLN
5000.0000 [IU] | Freq: Three times a day (TID) | INTRAMUSCULAR | Status: DC
  Administered 2012-01-15: 5000 [IU] via SUBCUTANEOUS
  Filled 2012-01-15 (×6): qty 1

## 2012-01-15 MED ORDER — MORPHINE SULFATE 2 MG/ML IJ SOLN: 2.0000 mg | INTRAMUSCULAR | Status: DC | PRN

## 2012-01-15 MED ORDER — ONDANSETRON HCL 4 MG PO TABS: 4.0000 mg | ORAL_TABLET | Freq: Four times a day (QID) | ORAL | Status: DC | PRN

## 2012-01-15 NOTE — ED Notes (Signed)
Condom cath placed on the patient

## 2012-01-15 NOTE — Progress Notes (Signed)
Clinical Social Work Department BRIEF PSYCHOSOCIAL ASSESSMENT 01/15/2012  Patient:  Shawn Baird,Shawn Baird     Account Number:  [id number]     Admit date:  01/14/2012  Clinical Social Worker:  Jacelyn Grip  Date/Time:  01/15/2012 02:00 PM  Referred by:  Physician  Date Referred:  01/15/2012 Referred for  Other - See comment   Other Referral:   Admitted from [facility]   Interview type:  Family Other interview type:    PSYCHOSOCIAL DATA Living Status:  FACILITY Admitted from facility:   Level of care:  Group Home Primary support name:  Shawn Baird) [phone](home) Primary support relationship to patient:  SIBLING Degree of support available:   strong    CURRENT CONCERNS Current Concerns  Post-Acute Placement   Other Concerns:    SOCIAL WORK ASSESSMENT / PLAN CSW received notification that pt admitted from [facility]. CSW was notified by nurse tech that pt family had arrived to hospital.    CSW met with pt, pt sister, and pt brother-in-law at beside. Pt sister confirmed that pt a resident at [facility] and plan is for pt to return at discharge.    CSW spoke with [facility] who confirmed that pt could return at discharge and that facility should be able to provide transportation. CSW discussed that pt may be ready for discharge during weekend and [facility] stated that pt could return during the weekend if pt stable, but facility will need discharge information including FL2 with discharge medications added faxed to [phone] at discharge. CSW to facilitate pt discharge needs when pt medically ready for discharge.   Assessment/plan status:  Psychosocial Support/Ongoing Assessment of Needs Other assessment/ plan:   discharge planning   Information/referral to community resources:   Referral back to [facility]    PATIENT'S/FAMILY'S RESPONSE TO PLAN OF  CARE: Pt alert and oriented to person only. Pt sister is supportive and actively involved in pt care. Pt family and group home are agreeable to pt return when medically stable.        Jacklynn Lewis, MSW, LCSWA  Clinical Social Work [phone]

## 2012-01-15 NOTE — ED Notes (Signed)
Attempted to call report to [phone],  RN will call back.

## 2012-01-15 NOTE — ED Notes (Signed)
Pt to radiology.

## 2012-01-15 NOTE — Progress Notes (Signed)
Clinical Social Worker received referral that pt admitted from a Group Home. Clinical Social Worker reviewed chart and noted that pt is from [facility]. Clinical Social Worker contacted pt emergency contact, sister, Zubayr Bednarczyk. Per Mrs. Carters spouse, pt sister is currently at work, but plans to come to hospital this afternoon. Clinical Social Worker to follow up with pt family this afternoon when they arrive to hospital. Clinical Social Worker to continue to follow and complete full psychosocial assessment at that time.   Jacklynn Lewis, MSW, LCSWA  Clinical Social Work [phone]

## 2012-01-15 NOTE — ED Notes (Signed)
Pt still noted to be crying and laughing, pt diaphoretic.  Pt given reassurance, pt denies needs.

## 2012-01-15 NOTE — H&P (Signed)
PCP:   Unknown   Chief Complaint:  Abdominal pain  HPI: This is a 51 year old gentleman who resides at group home, he is mentally sedation and schizoaffective disorder. He was brought in with complaints of abdominal pain that developed after he eats spicy foods. The patient is unable to write history because of his mental retardation. He does report his abdominal pain that is generalized. Patient is quite tearful. He had some nausea and vomiting here in the ER, no hematemesis. History provided by the year physician and nursing at bedside  Review of Systems:  Unable to the 10 point review of systems due to patient's mentation  Past Medical History: Past Medical History  Diagnosis Date  . Diabetes mellitus   . Anxiety   . Hypertension   . Hyperlipemia   . Mental disorder   . Schizoaffective disorder   . Impulsive control disorder  . Pancreatitis   . Otitis media   . Cancer   . HOH (hard of hearing)    History reviewed. No pertinent past surgical history.  Medications: Prior to Admission medications   Medication Sig Start Date End Date Taking? Authorizing Provider  amLODipine (NORVASC) 5 MG tablet Take 1 tablet (5 mg total) by mouth daily. 01/04/12  Yes Celene Kras, MD  aspirin 81 MG chewable tablet Chew 81 mg by mouth daily.   Yes Historical Provider, MD  loratadine (CLARITIN) 10 MG tablet Take 10 mg by mouth daily.   Yes Historical Provider, MD  lubiprostone (AMITIZA) 24 MCG capsule Take 24 mcg by mouth 2 (two) times daily with a meal.   Yes Historical Provider, MD  metFORMIN (GLUCOPHAGE-XR) 500 MG 24 hr tablet Take 500 mg by mouth daily with breakfast.   Yes Historical Provider, MD  omeprazole (PRILOSEC) 20 MG capsule Take 20 mg by mouth daily.   Yes Historical Provider, MD  polyethylene glycol (MIRALAX / GLYCOLAX) packet Take 17 g by mouth every morning.   Yes Historical Provider, MD  pravastatin (PRAVACHOL) 20 MG tablet Take 20 mg by mouth at bedtime.    Yes Historical  Provider, MD  QUEtiapine (SEROQUEL XR) 400 MG 24 hr tablet Take 1 tablet (400 mg total) by mouth at bedtime. 01/04/12  Yes Celene Kras, MD  vitamin B-12 (CYANOCOBALAMIN) 1000 MCG tablet Take 1,000 mcg by mouth daily.   Yes Historical Provider, MD    Allergies:  No Known Allergies  Social History:  reports that he has never smoked. He does not have any smokeless tobacco history on file. He reports that he does not drink alcohol or use illicit drugs. patient resides in a group home  Family History: Unable to obtain  Physical Exam: Filed Vitals:   01/15/12 0031 01/15/12 0100 01/15/12 0130 01/15/12 0200  BP: 133/92 128/65 131/87 145/104  Pulse: 78 84 81 90  Temp:      TempSrc:      Resp: 12 23    Height:      SpO2: 92% 94% 94% 96%    General:  Alert and oriented, well developed and nourished, crying from pain Eyes: PERRLA, pink conjunctiva, no scleral icterus ENT: Moist oral mucosa, neck supple, no thyromegaly Lungs: clear to ascultation, no wheeze, no crackles, no use of accessory muscles Cardiovascular: regular rate and rhythm, no regurgitation, no gallops, no murmurs. No carotid bruits, no JVD Abdomen: soft, positive BS, generalized tenderness to palpation mild to moderate, distended, no rebound tenderness elicited, no organomegaly,  GU: not examined Neuro: CN II -  XII grossly intact, sensation intact Musculoskeletal: strength 5/5 all extremities, no clubbing, cyanosis or edema Skin: no rash, no subcutaneous crepitation, no decubitus    Labs on Admission:   [facility] 01/14/12 2330  NA 135  K 3.6  CL 98  CO2 25  GLUCOSE 140*  BUN 12  CREATININE 1.51*  CALCIUM 9.3  MG --  PHOS --    Basename 01/14/12 2330  AST 30  ALT 36  ALKPHOS 108  BILITOT 0.3  PROT 8.2  ALBUMIN 4.1  Results for Shawn, Baird (MRN [id number]) as of 01/15/2012 04:07  Ref. Range 01/14/2012 23:30  Lactic Acid, Venous Latest Range: 0.5-2.2 mmol/L 1.7    Basename 01/14/12 2330  LIPASE 91*   AMYLASE --    Basename 01/14/12 2330  WBC 8.6  NEUTROABS 5.1  HGB 16.9  HCT 49.5  MCV 83.3  PLT 333    Micro Results: No results found for this or any previous visit (from the past 240 hour(s)).   Radiological Exams on Admission: Dg Abd Acute W/chest  01/15/2012  *RADIOLOGY REPORT*  Clinical Data: Abdominal pain, vomiting  ACUTE ABDOMEN SERIES (ABDOMEN 2 VIEW & CHEST 1 VIEW)  Comparison: Chest radiograph dated 01/01/2012.  Abdominal radiographs dated 08/22/2011.  Findings: Low lung volumes with vascular crowding.  No focal consolidation.  No pleural effusion or pneumothorax.  Cardiomediastinal silhouette is within normal limits.  Gastric distension.  Nonspecific bowel gas pattern without disproportionate small bowel dilatation to suggest small bowel obstruction.  No evidence of free air under the diaphragm on the upright view.  Visualized osseous structures are within normal limits.  IMPRESSION: No evidence of acute cardiopulmonary disease.  No evidence of small bowel obstruction or free air.  Gastric distension.   Original Report Authenticated By: Charline Bills, M.D.     *RADIOLOGY REPORT*  Clinical Data: Abdominal pain  CT ABDOMEN AND PELVIS WITHOUT CONTRAST  Technique: Multidetector CT imaging of the abdomen and pelvis was  performed following the standard protocol without intravenous  contrast.  Comparison: 01/15/2012 radiograph, 03/26/2011 CT  Findings: Cardiomegaly. Motion degrades evaluation of the lung  bases with mild left lung base opacity, favor atelectasis. Distal  esophageal wall thickening.  Organ abnormality/lesion detection is limited in the absence of  intravenous contrast. Within this limitation, diffuse low  attenuation of the liver suggests fatty infiltration. Unremarkable  biliary system, spleen, pancreas, adrenal glands.  Tiny left renal calcific density corresponds to the location of a  cystic focus on the prior contrast enhanced study, may reflect   calcification within a calyceal diverticulum or renal cyst. No  hydronephrosis or hydroureter.  Colonic diverticulosis. No definite evidence for colitis or  diverticulitis. Appendix not identified. No right lower quadrant  inflammation. No bowel obstruction. The stomach is distended. No  free intraperitoneal air or fluid. No lymphadenopathy.  Normal caliber aorta with minimal scattered atherosclerotic  disease.  Thin-walled bladder.  No acute osseous finding. Multilevel degenerative change.  IMPRESSION:  Distended stomach is nonspecific. May reflect gastroparesis.  Distal esophageal wall thickening can be seen in the setting of  esophagitis or gastroesophageal reflux disease.  Normal nonenhanced appearance to the pancreas despite the elevated  lipase.  Punctate interpolar left renal calcification may correspond to a  stone within a calyceal diverticulum or cyst. Recommend 47-month  ultrasound follow-up.  Hepatic steatosis.  Original Report Authenticated By: Jearld Lesch, M.D.    Assessment/Plan Present on Admission:   abdominal pain-unclear etiology Patient crying, unable to give proper history with his mental  retardation Recommend doing a CT abdomen and pelvis and await results  Pain medication and GI cocktail ordered IV fluid hydration Diabetes Hypertension Dyslipidemia Mental Retardation Schizoaffective disorder Resume home medications Clear liquid diet to be advanced as tolerated and, sliding scale insulin   Addendum CT abdomen and pelvis done, no significant findings. Patient did receive a GI cocktail which hasn't helped significantly. However the patient is a 23 hour observation on because he was in such significant discomfort. Continue PPI. Baby Aspirin held   DVT prophylaxis   ,  01/15/2012, 4:07 AM

## 2012-01-15 NOTE — ED Notes (Signed)
Pt placed on [place] d/t 89% on RA while sleeping.

## 2012-01-15 NOTE — Progress Notes (Signed)
Pt had not voided. Bladder scanned pt, showed 600 ml. Pt has condom catheter, encouraged to void. Pt voided 450 ml.

## 2012-01-15 NOTE — Progress Notes (Signed)
[facility] PROGRESS NOTE  Kesley Gaffey DGU:[id number] DOB: 1960/07/18 DOA: 01/14/2012 PCP: [name], MD  Brief narrative: Mr. Teuscher is a 51 year old man with a past medical history of mental retardation and schizoaffective disorder who was brought to the hospital on 01/15/2012 with abdominal pain, nausea and vomiting.  Assessment/Plan: Principal Problem:  *Abdominal  pain, other specified site with nausea and vomiting, likely due to viral gastroenteritis versus diabetic gastroparesis  CT scan of the abdomen and pelvis with findings of left renal calcification, esophageal thickening, and gastric distention concerning for gastroparesis. No significant ongoing abdominal pain noted by nursing staff. Symptoms most likely due to viral gastroenteritis but diabetic gastroparesis cannot be ruled out. Will advance diet and if he tolerates this with no ill effects, suspect a 24-hour viral gastroenteritis. If symptoms return, would get a gastric emptying study to evaluate for diabetic gastroparesis.  Aspirin placed on hold.  Continue PO Reglan and PPI therapy.  Active Problems: Left renal calcification  Recommend 6 month ultrasound followup. GERD  Placed on PPI therapy.  Diabetes  Continue sliding scale insulin. Metformin on hold.  Hypertension  Continue Norvasc. Blood pressure reasonable.  Hyperlipidemia  Pravachol on hold.  Schizoaffective disorder  Continue Seroquel.  Chronic constipation  Continue Amatiza.  Code Status: Full. Family Communication: None at bedside. Disposition Plan: ALF in 24 hours if symptoms remain controlled.   Medical Consultants:  None.  Other Consultants:  None.  Anti-infectives:  None.  HPI/Subjective: Mr. Yoakum abdominal pain appears to be resolved.  No N/V.  No diarrhea.  Has only had liquids since admission.  Objective: Filed Vitals:   01/15/12 0430 01/15/12 0534 01/15/12 0600 01/15/12 0641  BP: 141/92 135/89  106/72 130/78  Pulse: 104 100 100 94  Temp:    97.9 F (36.6 C)  TempSrc:    Oral  Resp: 18 18  16   Height:      Weight:    77.7 kg (171 lb 4.8 oz)  SpO2: 92% 96%  95%    Intake/Output Summary (Last 24 hours) at 01/15/12 1202 Last data filed at 01/15/12 1120  Gross per 24 hour  Intake      0 ml  Output    500 ml  Net   -500 ml    Exam: Gen:  NAD Cardiovascular:  RRR, No M/R/G Respiratory:  Lungs CTAB Gastrointestinal:  Abdomen soft, NT/ND, + BS Extremities:  No C/E/C  Data Reviewed: Basic Metabolic Panel:  Lab 01/14/12 9563  NA 135  K 3.6  CL 98  CO2 25  GLUCOSE 140*  BUN 12  CREATININE 1.51*  CALCIUM 9.3  MG --  PHOS --   GFR Estimated Creatinine Clearance: 57.2 ml/min (by C-G formula based on Cr of 1.51). Liver Function Tests:  Lab 01/14/12 2330  AST 30  ALT 36  ALKPHOS 108  BILITOT 0.3  PROT 8.2  ALBUMIN 4.1    Lab 01/14/12 2330  LIPASE 91*  AMYLASE --   CBC:  Lab 01/14/12 2330  WBC 8.6  NEUTROABS 5.1  HGB 16.9  HCT 49.5  MCV 83.3  PLT 333   CBG:  Lab 01/15/12 0752  GLUCAP 178*   Microbiology No results found for this or any previous visit (from the past 240 hour(s)).   Procedures and Diagnostic Studies: Ct Abdomen Pelvis Wo Contrast  01/15/2012  *RADIOLOGY REPORT*  Clinical Data: Abdominal pain  CT ABDOMEN AND PELVIS WITHOUT CONTRAST  Technique:  Multidetector CT imaging of the abdomen and pelvis was performed following  the standard protocol without intravenous contrast.  Comparison: 01/15/2012 radiograph, 03/26/2011 CT  Findings: Cardiomegaly.  Motion degrades evaluation of the lung bases with mild left lung base opacity, favor atelectasis. Distal esophageal wall thickening.  Organ abnormality/lesion detection is limited in the absence of intravenous contrast. Within this limitation, diffuse low attenuation of the liver suggests fatty infiltration.  Unremarkable biliary system, spleen, pancreas, adrenal glands.  Tiny left renal  calcific density corresponds to the location of a cystic focus on the prior contrast enhanced study, may reflect calcification within a calyceal diverticulum or renal cyst.  No hydronephrosis or hydroureter.  Colonic diverticulosis.  No definite evidence for colitis or diverticulitis.  Appendix not identified.  No right lower quadrant inflammation.  No bowel obstruction.  The stomach is distended.  No free intraperitoneal air or fluid.  No lymphadenopathy.  Normal caliber aorta with minimal scattered atherosclerotic disease.  Thin-walled bladder.  No acute osseous finding.  Multilevel degenerative change.  IMPRESSION: Distended stomach is nonspecific.  May reflect gastroparesis. Distal esophageal wall thickening can be seen in the setting of esophagitis or gastroesophageal reflux disease.  Normal nonenhanced appearance to the pancreas despite the elevated lipase.  Punctate interpolar left renal calcification may correspond to a stone within a calyceal diverticulum or cyst.  Recommend 51-month ultrasound follow-up.  Hepatic steatosis.   Original Report Authenticated By: Jearld Lesch, M.D.    Dg Chest 2 View  01/01/2012  *RADIOLOGY REPORT*  Clinical Data: Tachycardia.  Hypertension.  Diabetes.  CHEST - 2 VIEW  Comparison: 08/22/2011  Findings: Low lung volumes noted.  The patient has prominent epicardial adipose tissue based on prior CT scans.  Mild airway thickening is suggested on the lateral projection.  No pleural effusion or discrete airspace opacity.  IMPRESSION:  1.  Airway thickening is suggested on the lateral projection, and raises the possibility of bronchitis or reactive airways disease. 2.  Low lung volumes.   Original Report Authenticated By: Gaylyn Rong, M.D.    Dg Abd Acute W/chest  01/15/2012  *RADIOLOGY REPORT*  Clinical Data: Abdominal pain, vomiting  ACUTE ABDOMEN SERIES (ABDOMEN 2 VIEW & CHEST 1 VIEW)  Comparison: Chest radiograph dated 01/01/2012.  Abdominal radiographs dated  08/22/2011.  Findings: Low lung volumes with vascular crowding.  No focal consolidation.  No pleural effusion or pneumothorax.  Cardiomediastinal silhouette is within normal limits.  Gastric distension.  Nonspecific bowel gas pattern without disproportionate small bowel dilatation to suggest small bowel obstruction.  No evidence of free air under the diaphragm on the upright view.  Visualized osseous structures are within normal limits.  IMPRESSION: No evidence of acute cardiopulmonary disease.  No evidence of small bowel obstruction or free air.  Gastric distension.   Original Report Authenticated By: Charline Bills, M.D.     Scheduled Meds:    . amLODipine  5 mg Oral Daily  . heparin  5,000 Units Subcutaneous Q8H  . insulin aspart  0-15 Units Subcutaneous TID WC  . insulin aspart  0-5 Units Subcutaneous QHS  . lubiprostone  24 mcg Oral BID WC  . pantoprazole  40 mg Oral Daily  . QUEtiapine  400 mg Oral QHS   Continuous Infusions:    . 0.9 % NaCl with KCl 20 mEq / L 75 mL/hr at 01/15/12 0906    Time spent: 30 minutes.    LOS: 1 day   ,  [facility] Pager [phone].  If 8PM-8AM, please contact night-coverage at [email or web address], password [facility] 01/15/2012, 12:02 PM

## 2012-01-15 NOTE — ED Notes (Signed)
Patient is resting comfortably at this time, resp even and unlabored.

## 2012-01-15 NOTE — ED Notes (Signed)
[place] request CT only WO IV contrast, if pt can tolerate po contrast then this will be okay, but if not no po contrast either.

## 2012-01-15 NOTE — ED Notes (Signed)
Pt back from CT

## 2012-01-16 DIAGNOSIS — F259 Schizoaffective disorder, unspecified: Secondary | ICD-10-CM

## 2012-01-16 DIAGNOSIS — K219 Gastro-esophageal reflux disease without esophagitis: Secondary | ICD-10-CM

## 2012-01-16 DIAGNOSIS — E119 Type 2 diabetes mellitus without complications: Secondary | ICD-10-CM

## 2012-01-16 LAB — BASIC METABOLIC PANEL
CO2: 27 mEq/L (ref 19–32)
Calcium: 8.3 mg/dL — ABNORMAL LOW (ref 8.4–10.5)
Chloride: 103 mEq/L (ref 96–112)
Creatinine, Ser: 1.41 mg/dL — ABNORMAL HIGH (ref 0.50–1.35)
GFR calc Af Amer: 65 mL/min — ABNORMAL LOW (ref >90)
Sodium: 137 mEq/L (ref 135–145)

## 2012-01-16 LAB — CBC
Platelets: 290 10*3/uL (ref 150–400)
RBC: 4.85 MIL/uL (ref 4.22–5.81)
RDW: 13.9 % (ref 11.5–15.5)
WBC: 5 10*3/uL (ref 4.0–10.5)

## 2012-01-16 LAB — GLUCOSE, CAPILLARY: Glucose-Capillary: 116 mg/dL — ABNORMAL HIGH (ref 70–99)

## 2012-01-16 MED ORDER — QUETIAPINE FUMARATE ER 400 MG PO TB24: 400.0000 mg | ORAL_TABLET | Freq: Every day | ORAL | Status: DC

## 2012-01-16 MED ORDER — ZOLPIDEM TARTRATE 5 MG PO TABS: 5.0000 mg | ORAL_TABLET | Freq: Every evening | ORAL | Status: DC | PRN

## 2012-01-16 MED ORDER — METOCLOPRAMIDE HCL 5 MG PO TABS: 5.0000 mg | ORAL_TABLET | Freq: Three times a day (TID) | ORAL | Status: DC | PRN

## 2012-01-16 MED ORDER — SENNOSIDES-DOCUSATE SODIUM 8.6-50 MG PO TABS: 1.0000 | ORAL_TABLET | Freq: Every evening | ORAL | Status: DC | PRN

## 2012-01-16 MED ORDER — HYDROCODONE-ACETAMINOPHEN 5-325 MG PO TABS: 1.0000 | ORAL_TABLET | ORAL | Status: DC | PRN

## 2012-01-16 NOTE — Progress Notes (Signed)
Per MD, Pt ready for d/c.  Notified RN, Pt, family and facility.  Sent d/c summary and FL2.  Confirmed receipt of d/c summary.  Facility ready to receive Pt.  Pt's sister to provide transportation.  Pt to be d/c'd.  Providence Crosby, LCSWA Clinical Social Work [phone]

## 2012-01-16 NOTE — Progress Notes (Signed)
Patient discharged per MD order. Discharge instructions reviewed with patient's sister. DC packet prepared by social work given to sister who is transporting patient back to group home. Pt wheeled down to the car by nurse tech. Angelena Form, RN

## 2012-01-16 NOTE — Discharge Summary (Signed)
Physician Discharge Summary  Shawn Baird:[id number] DOB: 1960-03-24 DOA: 01/14/2012  PCP: [name], MD  Admit date: 01/14/2012 Discharge date: 01/16/2012  Discharge Diagnoses:  Principal Problem:  *Abdominal  pain, other specified site with nausea and vomiting, likely due to viral gastroenteritis versus diabetic gastroparesis Active Problems:  Diabetes  Hypertension  Hyperlipidemia  Schizoaffective disorder  Chronic constipation  GERD (gastroesophageal reflux disease)  Left Renal calcification  Discharge Condition: Medically stable and clinically appears well for discharge to assisted living facility, group home today  Diet recommendation: As tolerated, diabetic diet  History of present illness:  51 year old male with a past medical history of mental retardation and schizoaffective disorder who was brought to the hospital on 01/15/2012 with abdominal pain, nausea and vomiting.   Assessment/Plan:   Principal Problem:  *Abdominal pain, other specified site with nausea and vomiting, likely due to viral gastroenteritis versus diabetic gastroparesis  CT scan of the abdomen and pelvis with findings of left renal calcification, esophageal thickening, and gastric distention concerning for gastroparesis. There also may be a component of a viral gastro-enteritis Patient's diet has been advanced to regular, diabetic and he tolerates it with no vomiting Please note that we have provided a prescription for Reglan every 8 hours as needed for nausea or vomiting/gastroparesis to be given in the assisted living facility  Active Problems:  Left renal calcification  Recommend 6 month ultrasound followup. GERD  Placed on PPI therapy. Diabetes  May continue metformin on discharge Hypertension  Continue Norvasc Hyperlipidemia  Continue Pravachol Schizoaffective disorder  Continue Seroquel. Chronic constipation  Continue Amatiza.  Code Status: Full.  Family Communication:  None at bedside.  Disposition Plan: ALF today  Medical Consultants:  None. Other Consultants:  None. Anti-infectives:  None.  Discharge Exam: Filed Vitals:   01/16/12 0634  BP: 129/89  Pulse: 81  Temp: 97.8 F (36.6 C)  Resp: 18   Filed Vitals:   01/15/12 1220 01/15/12 1415 01/15/12 2106 01/16/12 0634  BP: 138/114 124/72 118/78 129/89  Pulse:  83 84 81  Temp:  98.1 F (36.7 C) 98.3 F (36.8 C) 97.8 F (36.6 C)  TempSrc:  Oral Oral Oral  Resp:  18 18 18   Height:      Weight:      SpO2:  100% 98% 99%    General: Pt is alert, follows commands appropriately, not in acute distress Cardiovascular: Regular rate and rhythm, S1/S2 +, no murmurs, no rubs, no gallops Respiratory: Clear to auscultation bilaterally, no wheezing, no crackles, no rhonchi Abdominal: Soft, non tender, non distended, bowel sounds +, no guarding Extremities: no edema, no cyanosis, pulses palpable bilaterally DP and PT Neuro: Grossly nonfocal  Discharge Instructions  Discharge Orders    Future Orders Please Complete By Expires   Diet - low sodium heart healthy      Increase activity slowly      Call MD for:  persistant nausea and vomiting      Call MD for:  severe uncontrolled pain      Call MD for:  difficulty breathing, headache or visual disturbances      Call MD for:  persistant dizziness or light-headedness          Medication List     As of 01/16/2012  8:01 AM    STOP taking these medications         cholecalciferol 400 UNITS Tabs   Commonly known as: VITAMIN D      TAKE these medications  amLODipine 5 MG tablet   Commonly known as: NORVASC   Take 1 tablet (5 mg total) by mouth daily.      aspirin 81 MG chewable tablet   Chew 81 mg by mouth daily.      HYDROcodone-acetaminophen 5-325 MG per tablet   Commonly known as: NORCO/VICODIN   Take 1-2 tablets by mouth every 4 (four) hours as needed for pain.      loratadine 10 MG tablet   Commonly known as: CLARITIN    Take 10 mg by mouth daily.      lubiprostone 24 MCG capsule   Commonly known as: AMITIZA   Take 24 mcg by mouth 2 (two) times daily with a meal.      metFORMIN 500 MG 24 hr tablet   Commonly known as: GLUCOPHAGE-XR   Take 500 mg by mouth daily with breakfast.      metoCLOPramide 5 MG tablet   Commonly known as: REGLAN   Take 1 tablet (5 mg total) by mouth every 8 (eight) hours as needed (gastroparesis, nausea and/or vomiting).      omeprazole 20 MG capsule   Commonly known as: PRILOSEC   Take 20 mg by mouth daily.      polyethylene glycol packet   Commonly known as: MIRALAX / GLYCOLAX   Take 17 g by mouth every morning.      pravastatin 20 MG tablet   Commonly known as: PRAVACHOL   Take 20 mg by mouth at bedtime.      QUEtiapine 400 MG 24 hr tablet   Commonly known as: SEROQUEL XR   Take 1 tablet (400 mg total) by mouth at bedtime.      senna-docusate 8.6-50 MG per tablet   Commonly known as: Senokot-S   Take 1 tablet by mouth at bedtime as needed.      vitamin B-12 1000 MCG tablet   Commonly known as: CYANOCOBALAMIN   Take 1,000 mcg by mouth daily.      zolpidem 5 MG tablet   Commonly known as: AMBIEN   Take 1 tablet (5 mg total) by mouth at bedtime as needed for sleep (insomnia).           Follow-up Information    Follow up with OSEI-BONSU,GEORGE, MD. In 2 weeks. (As needed)    Contact information:   [address], [address] [place] [address] [phone]           The results of significant diagnostics from this hospitalization (including imaging, microbiology, ancillary and laboratory) are listed below for reference.    Significant Diagnostic Studies: Ct Abdomen Pelvis Wo Contrast  01/15/2012  *RADIOLOGY REPORT*  Clinical Data: Abdominal pain  CT ABDOMEN AND PELVIS WITHOUT CONTRAST  Technique:  Multidetector CT imaging of the abdomen and pelvis was performed following the standard protocol without intravenous contrast.  Comparison: 01/15/2012  radiograph, 03/26/2011 CT  Findings: Cardiomegaly.  Motion degrades evaluation of the lung bases with mild left lung base opacity, favor atelectasis. Distal esophageal wall thickening.  Organ abnormality/lesion detection is limited in the absence of intravenous contrast. Within this limitation, diffuse low attenuation of the liver suggests fatty infiltration.  Unremarkable biliary system, spleen, pancreas, adrenal glands.  Tiny left renal calcific density corresponds to the location of a cystic focus on the prior contrast enhanced study, may reflect calcification within a calyceal diverticulum or renal cyst.  No hydronephrosis or hydroureter.  Colonic diverticulosis.  No definite evidence for colitis or diverticulitis.  Appendix not  identified.  No right lower quadrant inflammation.  No bowel obstruction.  The stomach is distended.  No free intraperitoneal air or fluid.  No lymphadenopathy.  Normal caliber aorta with minimal scattered atherosclerotic disease.  Thin-walled bladder.  No acute osseous finding.  Multilevel degenerative change.  IMPRESSION: Distended stomach is nonspecific.  May reflect gastroparesis. Distal esophageal wall thickening can be seen in the setting of esophagitis or gastroesophageal reflux disease.  Normal nonenhanced appearance to the pancreas despite the elevated lipase.  Punctate interpolar left renal calcification may correspond to a stone within a calyceal diverticulum or cyst.  Recommend 30-month ultrasound follow-up.  Hepatic steatosis.   Original Report Authenticated By: Jearld Lesch, M.D.    Dg Chest 2 View  01/01/2012  *RADIOLOGY REPORT*  Clinical Data: Tachycardia.  Hypertension.  Diabetes.  CHEST - 2 VIEW  Comparison: 08/22/2011  Findings: Low lung volumes noted.  The patient has prominent epicardial adipose tissue based on prior CT scans.  Mild airway thickening is suggested on the lateral projection.  No pleural effusion or discrete airspace opacity.  IMPRESSION:  1.   Airway thickening is suggested on the lateral projection, and raises the possibility of bronchitis or reactive airways disease. 2.  Low lung volumes.   Original Report Authenticated By: Gaylyn Rong, M.D.    Dg Abd Acute W/chest  01/15/2012  *RADIOLOGY REPORT*  Clinical Data: Abdominal pain, vomiting  ACUTE ABDOMEN SERIES (ABDOMEN 2 VIEW & CHEST 1 VIEW)  Comparison: Chest radiograph dated 01/01/2012.  Abdominal radiographs dated 08/22/2011.  Findings: Low lung volumes with vascular crowding.  No focal consolidation.  No pleural effusion or pneumothorax.  Cardiomediastinal silhouette is within normal limits.  Gastric distension.  Nonspecific bowel gas pattern without disproportionate small bowel dilatation to suggest small bowel obstruction.  No evidence of free air under the diaphragm on the upright view.  Visualized osseous structures are within normal limits.  IMPRESSION: No evidence of acute cardiopulmonary disease.  No evidence of small bowel obstruction or free air.  Gastric distension.   Original Report Authenticated By: Charline Bills, M.D.     Microbiology: No results found for this or any previous visit (from the past 240 hour(s)).   Labs: Basic Metabolic Panel:  Lab 01/16/12 1610 01/14/12 2330  NA 137 135  K 3.6 3.6  CL 103 98  CO2 27 25  GLUCOSE 101* 140*  BUN 12 12  CREATININE 1.41* 1.51*  CALCIUM 8.3* 9.3   Liver Function Tests:  Lab 01/14/12 2330  AST 30  ALT 36  ALKPHOS 108  BILITOT 0.3  PROT 8.2  ALBUMIN 4.1    Lab 01/14/12 2330  LIPASE 91*  AMYLASE --   CBC:  Lab 01/16/12 0346 01/14/12 2330  WBC 5.0 8.6  NEUTROABS -- 5.1  HGB 12.8* 16.9  HCT 40.3 49.5  MCV 83.1 83.3  PLT 290 333   Cardiac Enzymes: No results found for this basename: CKTOTAL:5,CKMB:5,CKMBINDEX:5,TROPONINI:5 in the last 168 hours BNP: BNP (last 3 results) No results found for this basename: PROBNP:3 in the last 8760 hours CBG:  Lab 01/16/12 0739 01/15/12 2343 01/15/12 1745  01/15/12 1241 01/15/12 0752  GLUCAP 116* 137* 119* 112* 178*    Time coordinating discharge: Over 30 minutes  Signed:  Manson Passey, MD  [facility] 01/16/2012, 8:01 AM  Pager #: [phone]

## 2012-02-12 ENCOUNTER — Emergency Department (HOSPITAL_COMMUNITY): Payer: Medicare Other

## 2012-02-12 ENCOUNTER — Emergency Department (HOSPITAL_COMMUNITY)
Admission: EM | Admit: 2012-02-12 | Discharge: 2012-02-13 | Disposition: A | Discharge status: Home / Self Care | Payer: Medicare Other | Attending: Emergency Medicine | Admitting: Emergency Medicine

## 2012-02-12 ENCOUNTER — Encounter (HOSPITAL_COMMUNITY): Payer: Self-pay | Admitting: Emergency Medicine

## 2012-02-12 DIAGNOSIS — I10 Essential (primary) hypertension: Secondary | ICD-10-CM | POA: Insufficient documentation

## 2012-02-12 DIAGNOSIS — R1084 Generalized abdominal pain: Secondary | ICD-10-CM | POA: Insufficient documentation

## 2012-02-12 DIAGNOSIS — F411 Generalized anxiety disorder: Secondary | ICD-10-CM | POA: Insufficient documentation

## 2012-02-12 DIAGNOSIS — Z79899 Other long term (current) drug therapy: Secondary | ICD-10-CM | POA: Insufficient documentation

## 2012-02-12 DIAGNOSIS — Z8589 Personal history of malignant neoplasm of other organs and systems: Secondary | ICD-10-CM | POA: Insufficient documentation

## 2012-02-12 DIAGNOSIS — Z8659 Personal history of other mental and behavioral disorders: Secondary | ICD-10-CM | POA: Insufficient documentation

## 2012-02-12 DIAGNOSIS — R109 Unspecified abdominal pain: Secondary | ICD-10-CM

## 2012-02-12 DIAGNOSIS — Z8719 Personal history of other diseases of the digestive system: Secondary | ICD-10-CM | POA: Insufficient documentation

## 2012-02-12 DIAGNOSIS — Z8669 Personal history of other diseases of the nervous system and sense organs: Secondary | ICD-10-CM | POA: Insufficient documentation

## 2012-02-12 DIAGNOSIS — F639 Impulse disorder, unspecified: Secondary | ICD-10-CM | POA: Insufficient documentation

## 2012-02-12 DIAGNOSIS — F259 Schizoaffective disorder, unspecified: Secondary | ICD-10-CM | POA: Insufficient documentation

## 2012-02-12 DIAGNOSIS — E119 Type 2 diabetes mellitus without complications: Secondary | ICD-10-CM | POA: Insufficient documentation

## 2012-02-12 DIAGNOSIS — E785 Hyperlipidemia, unspecified: Secondary | ICD-10-CM | POA: Insufficient documentation

## 2012-02-12 LAB — COMPREHENSIVE METABOLIC PANEL
ALT: 33 U/L (ref 0–53)
AST: 28 U/L (ref 0–37)
Albumin: 4.1 g/dL (ref 3.5–5.2)
Alkaline Phosphatase: 116 U/L (ref 39–117)
BUN: 14 mg/dL (ref 6–23)
CO2: 22 mEq/L (ref 19–32)
Calcium: 9.5 mg/dL (ref 8.4–10.5)
Chloride: 107 mEq/L (ref 96–112)
Creatinine, Ser: 1.51 mg/dL — ABNORMAL HIGH (ref 0.50–1.35)
GFR calc Af Amer: 60 mL/min — ABNORMAL LOW (ref >90)
GFR calc non Af Amer: 51 mL/min — ABNORMAL LOW (ref >90)
Glucose, Bld: 113 mg/dL — ABNORMAL HIGH (ref 70–99)
Potassium: 3.8 mEq/L (ref 3.5–5.1)
Sodium: 143 mEq/L (ref 135–145)
Total Bilirubin: 0.4 mg/dL (ref 0.3–1.2)
Total Protein: 8.2 g/dL (ref 6.0–8.3)

## 2012-02-12 LAB — CBC WITH DIFFERENTIAL/PLATELET
Basophils Absolute: 0.1 10*3/uL (ref 0.0–0.1)
Basophils Relative: 1 % (ref 0–1)
Eosinophils Absolute: 0.3 10*3/uL (ref 0.0–0.7)
Eosinophils Relative: 5 % (ref 0–5)
HCT: 48.8 % (ref 39.0–52.0)
Hemoglobin: 16.7 g/dL (ref 13.0–17.0)
Lymphocytes Relative: 27 % (ref 12–46)
Lymphs Abs: 1.7 10*3/uL (ref 0.7–4.0)
MCH: 28.6 pg (ref 26.0–34.0)
MCHC: 34.2 g/dL (ref 30.0–36.0)
MCV: 83.6 fL (ref 78.0–100.0)
Monocytes Absolute: 0.7 10*3/uL (ref 0.1–1.0)
Monocytes Relative: 11 % (ref 3–12)
Neutro Abs: 3.5 10*3/uL (ref 1.7–7.7)
Neutrophils Relative %: 56 % (ref 43–77)
Platelets: 337 10*3/uL (ref 150–400)
RBC: 5.84 MIL/uL — ABNORMAL HIGH (ref 4.22–5.81)
RDW: 13.8 % (ref 11.5–15.5)
WBC: 6.3 10*3/uL (ref 4.0–10.5)

## 2012-02-12 LAB — ETHANOL: Alcohol, Ethyl (B): <11 mg/dL (ref 0–11)

## 2012-02-12 LAB — URINALYSIS, ROUTINE W REFLEX MICROSCOPIC
Bilirubin Urine: NEGATIVE
Glucose, UA: NEGATIVE mg/dL
Hgb urine dipstick: NEGATIVE
Ketones, ur: 40 mg/dL — AB
Leukocytes, UA: NEGATIVE
Nitrite: NEGATIVE
Protein, ur: NEGATIVE mg/dL
Specific Gravity, Urine: 1.02 (ref 1.005–1.030)
Urobilinogen, UA: 1 mg/dL (ref 0.0–1.0)
pH: 5.5 (ref 5.0–8.0)

## 2012-02-12 LAB — RAPID URINE DRUG SCREEN, HOSP PERFORMED
Amphetamines: NOT DETECTED
Barbiturates: NOT DETECTED
Benzodiazepines: NOT DETECTED
Cocaine: NOT DETECTED
Opiates: POSITIVE — AB
Tetrahydrocannabinol: NOT DETECTED

## 2012-02-12 LAB — GLUCOSE, CAPILLARY
Glucose-Capillary: 162 mg/dL — ABNORMAL HIGH (ref 70–99)
Glucose-Capillary: 171 mg/dL — ABNORMAL HIGH (ref 70–99)

## 2012-02-12 LAB — LIPASE, BLOOD: Lipase: 67 U/L — ABNORMAL HIGH (ref 11–59)

## 2012-02-12 MED ORDER — MORPHINE SULFATE 4 MG/ML IJ SOLN
INTRAMUSCULAR | Status: AC
  Administered 2012-02-12: 4 mg via INTRAVENOUS
  Filled 2012-02-12: qty 1

## 2012-02-12 MED ORDER — GI COCKTAIL ~~LOC~~
30.0000 mL | Freq: Once | ORAL | Status: AC
  Administered 2012-02-12: 30 mL via ORAL
  Filled 2012-02-12: qty 30

## 2012-02-12 MED ORDER — ONDANSETRON HCL 4 MG/2ML IJ SOLN
INTRAMUSCULAR | Status: AC
  Filled 2012-02-12: qty 2

## 2012-02-12 MED ORDER — LORAZEPAM 2 MG/ML IJ SOLN
1.0000 mg | Freq: Once | INTRAMUSCULAR | Status: AC
  Administered 2012-02-12: 1 mg via INTRAVENOUS
  Filled 2012-02-12: qty 1

## 2012-02-12 MED ORDER — LORAZEPAM 2 MG/ML IJ SOLN
1.0000 mg | Freq: Once | INTRAMUSCULAR | Status: AC
  Administered 2012-02-12: 08:00:00 via INTRAVENOUS
  Filled 2012-02-12: qty 1

## 2012-02-12 MED ORDER — MORPHINE SULFATE 2 MG/ML IJ SOLN
INTRAMUSCULAR | Status: AC
  Administered 2012-02-12: 2 mg via INTRAVENOUS
  Filled 2012-02-12: qty 1

## 2012-02-12 MED ORDER — SODIUM CHLORIDE 0.9 % IV BOLUS (SEPSIS)
1000.0000 mL | Freq: Once | INTRAVENOUS | Status: AC
  Administered 2012-02-12: 1000 mL via INTRAVENOUS

## 2012-02-12 MED ORDER — FAMOTIDINE 20 MG PO TABS
20.0000 mg | ORAL_TABLET | Freq: Once | ORAL | Status: AC
  Administered 2012-02-12: 20 mg via ORAL
  Filled 2012-02-12: qty 1

## 2012-02-12 MED ORDER — PANTOPRAZOLE SODIUM 40 MG IV SOLR
40.0000 mg | Freq: Once | INTRAVENOUS | Status: DC
  Filled 2012-02-12: qty 40

## 2012-02-12 MED ORDER — ZIPRASIDONE MESYLATE 20 MG IM SOLR
10.0000 mg | Freq: Once | INTRAMUSCULAR | Status: AC
  Administered 2012-02-12: 10 mg via INTRAMUSCULAR
  Filled 2012-02-12: qty 20

## 2012-02-12 MED ORDER — ONDANSETRON HCL 4 MG/2ML IJ SOLN
4.0000 mg | Freq: Once | INTRAMUSCULAR | Status: AC
  Administered 2012-02-12: 4 mg via INTRAVENOUS

## 2012-02-12 NOTE — ED Notes (Signed)
Patient is resting. No grimace on expression. Still Beads of sweats noted on forehead.

## 2012-02-12 NOTE — ED Notes (Signed)
Patient eating dinner.

## 2012-02-12 NOTE — ED Notes (Signed)
Geodon held at this time per Ebbie Ridge, EDPA since pt is medicated for pain.

## 2012-02-12 NOTE — ED Notes (Signed)
ZOX:[id number]<AV> Expected date:02/12/12<BR> Expected time: 7:19 AM<BR> Means of arrival:Ambulance<BR> Comments:<BR> abd pain/hx of pancreatitis

## 2012-02-12 NOTE — ED Notes (Signed)
Pt shouting "someone help me, my stomach hurts!"

## 2012-02-12 NOTE — ED Notes (Signed)
Patient sleeping. VSS.

## 2012-02-12 NOTE — ED Notes (Signed)
Patient given 10mg  Geodon per Dr. Norman Herrlich order due to agitation.  Patient c/o abdominal pain and was getting ready to go to CT but was agitated and could not sit still.  Patient will not stay on stretcher.  Attempting to redirect patient to lay on stretcher so IV does not get pulled out.

## 2012-02-12 NOTE — ED Notes (Signed)
2 hours since arrival still hallucinc type loud laugh, still profanity, still acknowledges hurting all over.

## 2012-02-12 NOTE — ED Notes (Signed)
Per EMS patient coming from home. EMS stated family reports patient is having all over pain based on patient's behavior. Patient is crying, using profanity, and beads of sweat is noted. Patient has a history of MR, & schzoaffective.

## 2012-02-12 NOTE — ED Notes (Signed)
Pt attempted to provide urine sample,however, is unable to void at this time.

## 2012-02-12 NOTE — ED Provider Notes (Signed)
History     CSN: [id number]  Arrival date & time 02/12/12  7829   First MD Initiated Contact with Patient 02/12/12 [phone]      Chief Complaint  Patient presents with  . Psychiatric Evaluation  . Abdominal Pain    (Consider location/radiation/quality/duration/timing/severity/associated sxs/prior treatment) HPI It is reported from the group home that the patient was yelling out and crying.  Patient only say that he hurts all over and that his stomach hurts.  He won't answer questions and continues to scream out and is crying.  Patient is not able to be calmed down or consoled Past Medical History  Diagnosis Date  . Diabetes mellitus   . Anxiety   . Hypertension   . Hyperlipemia   . Mental disorder   . Schizoaffective disorder   . Impulsive control disorder  . Pancreatitis   . Otitis media   . Cancer   . HOH (hard of hearing)     History reviewed. No pertinent past surgical history.  History reviewed. No pertinent family history.  History  Substance Use Topics  . Smoking status: Never Smoker   . Smokeless tobacco: Not on file  . Alcohol Use: No      Review of Systems Level V caveat applies due to uncooperativeness and questionable mental impairment. Allergies  Review of patient's allergies indicates no known allergies.  Home Medications   Current Outpatient Rx  Name  Route  Sig  Dispense  Refill  . AMLODIPINE BESYLATE 5 MG PO TABS   Oral   Take 1 tablet (5 mg total) by mouth daily.   30 tablet   0   . LORATADINE 10 MG PO TABS   Oral   Take 10 mg by mouth daily.         . LUBIPROSTONE 24 MCG PO CAPS   Oral   Take 24 mcg by mouth 2 (two) times daily with a meal.         . METFORMIN HCL ER 500 MG PO TB24   Oral   Take 500 mg by mouth daily with breakfast.         . OMEPRAZOLE 20 MG PO CPDR   Oral   Take 20 mg by mouth daily.         Marland Kitchen POLYETHYLENE GLYCOL 3350 PO PACK   Oral   Take 17 g by mouth every morning.         Marland Kitchen PRAVASTATIN  SODIUM 20 MG PO TABS   Oral   Take 20 mg by mouth at bedtime.          Marland Kitchen VITAMIN B-12 1000 MCG PO TABS   Oral   Take 1,000 mcg by mouth daily.         Marland Kitchen VITAMIN B-12 1000 MCG PO TABS   Oral   Take 1,000 mcg by mouth daily.           BP 120/88  Pulse 96  Temp 98.3 F (36.8 C) (Oral)  Resp 16  SpO2 96%  Physical Exam  Nursing note and vitals reviewed. Constitutional: He appears well-developed and well-nourished.  HENT:  Head: Normocephalic and atraumatic.  Neck: Normal range of motion. Neck supple.  Cardiovascular: Normal rate, regular rhythm and normal heart sounds.  Exam reveals no gallop and no friction rub.   No murmur heard. Pulmonary/Chest: Effort normal and breath sounds normal. No respiratory distress.  Abdominal: Soft. He exhibits no distension. There is no tenderness. There is no guarding.  Skin: Skin is warm. No rash noted.  Psychiatric: His affect is inappropriate. He is agitated. He is noncommunicative.    ED Course  Procedures (including critical care time)  Labs Reviewed  GLUCOSE, CAPILLARY - Abnormal; Notable for the following:    Glucose-Capillary 104 (*)     All other components within normal limits  COMPREHENSIVE METABOLIC PANEL - Abnormal; Notable for the following:    Glucose, Bld 113 (*)     Creatinine, Ser 1.51 (*)     GFR calc non Af Amer 51 (*)     GFR calc Af Amer 60 (*)     All other components within normal limits  CBC WITH DIFFERENTIAL - Abnormal; Notable for the following:    RBC 5.84 (*)     All other components within normal limits  URINALYSIS, ROUTINE W REFLEX MICROSCOPIC - Abnormal; Notable for the following:    Ketones, ur 40 (*)     All other components within normal limits  LIPASE, BLOOD - Abnormal; Notable for the following:    Lipase 67 (*)     All other components within normal limits  URINE RAPID DRUG SCREEN (HOSP PERFORMED) - Abnormal; Notable for the following:    Opiates POSITIVE (*)     All other components  within normal limits  ETHANOL  The patient has been seen by the attending Physician.  The patient is finally calmed after Geodon. I spoke with [facility] about the patient and they will see him as well.   2:45 PM reassessed.  Patient continues to say that his whole belly hurts to get a noncontrast CT to evaluate further the still feel that there is a psychiatric component to this issue.  Upon reviewing his previous chart and with similar complaints. MDM          Carlyle Dolly, PA-C 02/12/12 1409  Carlyle Dolly, PA-C 02/12/12 1454

## 2012-02-13 ENCOUNTER — Emergency Department (HOSPITAL_COMMUNITY)
Admission: EM | Admit: 2012-02-13 | Discharge: 2012-02-14 | Disposition: A | Discharge status: Home / Self Care | Payer: Medicare Other | Attending: Emergency Medicine | Admitting: Emergency Medicine

## 2012-02-13 ENCOUNTER — Encounter (HOSPITAL_COMMUNITY): Payer: Self-pay | Admitting: Emergency Medicine

## 2012-02-13 DIAGNOSIS — Z7982 Long term (current) use of aspirin: Secondary | ICD-10-CM | POA: Insufficient documentation

## 2012-02-13 DIAGNOSIS — F639 Impulse disorder, unspecified: Secondary | ICD-10-CM | POA: Insufficient documentation

## 2012-02-13 DIAGNOSIS — F911 Conduct disorder, childhood-onset type: Secondary | ICD-10-CM | POA: Insufficient documentation

## 2012-02-13 DIAGNOSIS — F79 Unspecified intellectual disabilities: Secondary | ICD-10-CM | POA: Insufficient documentation

## 2012-02-13 DIAGNOSIS — Z8669 Personal history of other diseases of the nervous system and sense organs: Secondary | ICD-10-CM | POA: Insufficient documentation

## 2012-02-13 DIAGNOSIS — R109 Unspecified abdominal pain: Secondary | ICD-10-CM | POA: Insufficient documentation

## 2012-02-13 DIAGNOSIS — Z8589 Personal history of malignant neoplasm of other organs and systems: Secondary | ICD-10-CM | POA: Insufficient documentation

## 2012-02-13 DIAGNOSIS — IMO0002 Reserved for concepts with insufficient information to code with codable children: Secondary | ICD-10-CM

## 2012-02-13 DIAGNOSIS — F411 Generalized anxiety disorder: Secondary | ICD-10-CM | POA: Insufficient documentation

## 2012-02-13 DIAGNOSIS — Z8719 Personal history of other diseases of the digestive system: Secondary | ICD-10-CM | POA: Insufficient documentation

## 2012-02-13 DIAGNOSIS — E785 Hyperlipidemia, unspecified: Secondary | ICD-10-CM | POA: Insufficient documentation

## 2012-02-13 DIAGNOSIS — I10 Essential (primary) hypertension: Secondary | ICD-10-CM | POA: Insufficient documentation

## 2012-02-13 DIAGNOSIS — E669 Obesity, unspecified: Secondary | ICD-10-CM | POA: Insufficient documentation

## 2012-02-13 DIAGNOSIS — F259 Schizoaffective disorder, unspecified: Secondary | ICD-10-CM | POA: Insufficient documentation

## 2012-02-13 DIAGNOSIS — E119 Type 2 diabetes mellitus without complications: Secondary | ICD-10-CM | POA: Insufficient documentation

## 2012-02-13 DIAGNOSIS — Z79899 Other long term (current) drug therapy: Secondary | ICD-10-CM | POA: Insufficient documentation

## 2012-02-13 DIAGNOSIS — F919 Conduct disorder, unspecified: Secondary | ICD-10-CM | POA: Insufficient documentation

## 2012-02-13 MED ORDER — METOCLOPRAMIDE HCL 10 MG PO TABS: 10.0000 mg | ORAL_TABLET | Freq: Four times a day (QID) | ORAL | Status: DC

## 2012-02-13 MED ORDER — METOCLOPRAMIDE HCL 5 MG/ML IJ SOLN: 10.0000 mg | Freq: Once | INTRAMUSCULAR | Status: DC

## 2012-02-13 MED ORDER — LORAZEPAM 1 MG PO TABS
1.0000 mg | ORAL_TABLET | Freq: Once | ORAL | Status: AC
  Administered 2012-02-13: 1 mg via ORAL
  Filled 2012-02-13: qty 1

## 2012-02-13 MED ORDER — ZIPRASIDONE MESYLATE 20 MG IM SOLR
20.0000 mg | Freq: Once | INTRAMUSCULAR | Status: AC
  Administered 2012-02-13: 20 mg via INTRAMUSCULAR
  Filled 2012-02-13: qty 20

## 2012-02-13 NOTE — ED Provider Notes (Signed)
Patient reassessed. He is calm this morning. He has no complaints. He is eating a sandwich without any further abdominal pain. He has had no nausea or vomiting. His CT scan did not show any acute abnormalities. Prior records reviewed. He had similar presentation in December that warranted admission. It was thought that he had a gastroenteritis versus gastroparesis that would need further workup. I will start patient on Reglan and make a note to his primary care provider for outpatient workup of possible gastroparesis. Patient is amenable to going home. He is no longer agitated or. [facility] team also seen the patient and does not feel he meets criteria for psychiatric admission or further evaluation. Results for orders placed during the hospital encounter of 02/12/12  GLUCOSE, CAPILLARY      Component Value Range   Glucose-Capillary 104 (*) 70 - 99 mg/dL  COMPREHENSIVE METABOLIC PANEL      Component Value Range   Sodium 143  135 - 145 mEq/L   Potassium 3.8  3.5 - 5.1 mEq/L   Chloride 107  96 - 112 mEq/L   CO2 22  19 - 32 mEq/L   Glucose, Bld 113 (*) 70 - 99 mg/dL   BUN 14  6 - 23 mg/dL   Creatinine, Ser 1.61 (*) 0.50 - 1.35 mg/dL   Calcium 9.5  8.4 - 09.6 mg/dL   Total Protein 8.2  6.0 - 8.3 g/dL   Albumin 4.1  3.5 - 5.2 g/dL   AST 28  0 - 37 U/L   ALT 33  0 - 53 U/L   Alkaline Phosphatase 116  39 - 117 U/L   Total Bilirubin 0.4  0.3 - 1.2 mg/dL   GFR calc non Af Amer 51 (*) >90 mL/min   GFR calc Af Amer 60 (*) >90 mL/min  CBC WITH DIFFERENTIAL      Component Value Range   WBC 6.3  4.0 - 10.5 K/uL   RBC 5.84 (*) 4.22 - 5.81 MIL/uL   Hemoglobin 16.7  13.0 - 17.0 g/dL   HCT 04.5  40.9 - 81.1 %   MCV 83.6  78.0 - 100.0 fL   MCH 28.6  26.0 - 34.0 pg   MCHC 34.2  30.0 - 36.0 g/dL   RDW 91.4  78.2 - 95.6 %   Platelets 337  150 - 400 K/uL   Neutrophils Relative 56  43 - 77 %   Neutro Abs 3.5  1.7 - 7.7 K/uL   Lymphocytes Relative 27  12 - 46 %   Lymphs Abs 1.7  0.7 - 4.0 K/uL   Monocytes  Relative 11  3 - 12 %   Monocytes Absolute 0.7  0.1 - 1.0 K/uL   Eosinophils Relative 5  0 - 5 %   Eosinophils Absolute 0.3  0.0 - 0.7 K/uL   Basophils Relative 1  0 - 1 %   Basophils Absolute 0.1  0.0 - 0.1 K/uL  URINALYSIS, ROUTINE W REFLEX MICROSCOPIC      Component Value Range   Color, Urine YELLOW  YELLOW   APPearance CLEAR  CLEAR   Specific Gravity, Urine 1.020  1.005 - 1.030   pH 5.5  5.0 - 8.0   Glucose, UA NEGATIVE  NEGATIVE mg/dL   Hgb urine dipstick NEGATIVE  NEGATIVE   Bilirubin Urine NEGATIVE  NEGATIVE   Ketones, ur 40 (*) NEGATIVE mg/dL   Protein, ur NEGATIVE  NEGATIVE mg/dL   Urobilinogen, UA 1.0  0.0 - 1.0 mg/dL  Nitrite NEGATIVE  NEGATIVE   Leukocytes, UA NEGATIVE  NEGATIVE  LIPASE, BLOOD      Component Value Range   Lipase 67 (*) 11 - 59 U/L  ETHANOL      Component Value Range   Alcohol, Ethyl (B) <11  0 - 11 mg/dL  URINE RAPID DRUG SCREEN (HOSP PERFORMED)      Component Value Range   Opiates POSITIVE (*) NONE DETECTED   Cocaine NONE DETECTED  NONE DETECTED   Benzodiazepines NONE DETECTED  NONE DETECTED   Amphetamines NONE DETECTED  NONE DETECTED   Tetrahydrocannabinol NONE DETECTED  NONE DETECTED   Barbiturates NONE DETECTED  NONE DETECTED  GLUCOSE, CAPILLARY      Component Value Range   Glucose-Capillary 162 (*) 70 - 99 mg/dL   Comment 1 Documented in Chart     Comment 2 Notify RN    GLUCOSE, CAPILLARY      Component Value Range   Glucose-Capillary 171 (*) 70 - 99 mg/dL   Ct Abdomen Pelvis Wo Contrast  02/12/2012  *RADIOLOGY REPORT*  Clinical Data: Abdominal pain.  CT ABDOMEN AND PELVIS WITHOUT CONTRAST  Technique:  Multidetector CT imaging of the abdomen and pelvis was performed following the standard protocol without intravenous contrast.  Comparison: 01/15/2012  Findings: There is atelectasis or scarring at the left lung base. Findings at the left lung base may represent postinflammatory changes.  There is no evidence for free intraperitoneal air.  Unenhanced CT was performed per clinician order.  Lack of IV contrast limits sensitivity and specificity, especially for evaluation of abdominal/pelvic solid viscera.  Images of upper abdomen are mildly limited due to artifact from the patient's overlying arms.  There is no gross abnormality to the liver, gallbladder, spleen, pancreas, adrenal glands or kidneys.  No gross abnormality to the prostate or urinary bladder.  No evidence for free fluid or lymphadenopathy.  Small lymph nodes in the mesentery are nonspecific.  The stomach is moderately distended.  There is no significant bowel dilatation.  Stable areas of sclerosis in the right iliac bone.  No acute bony abnormality.  IMPRESSION: No acute abnormalities within the abdomen or pelvis.  Scarring or postinflammatory changes at the left lung base.   Original Report Authenticated By: Richarda Overlie, M.D.    Ct Abdomen Pelvis Wo Contrast  01/15/2012  *RADIOLOGY REPORT*  Clinical Data: Abdominal pain  CT ABDOMEN AND PELVIS WITHOUT CONTRAST  Technique:  Multidetector CT imaging of the abdomen and pelvis was performed following the standard protocol without intravenous contrast.  Comparison: 01/15/2012 radiograph, 03/26/2011 CT  Findings: Cardiomegaly.  Motion degrades evaluation of the lung bases with mild left lung base opacity, favor atelectasis. Distal esophageal wall thickening.  Organ abnormality/lesion detection is limited in the absence of intravenous contrast. Within this limitation, diffuse low attenuation of the liver suggests fatty infiltration.  Unremarkable biliary system, spleen, pancreas, adrenal glands.  Tiny left renal calcific density corresponds to the location of a cystic focus on the prior contrast enhanced study, may reflect calcification within a calyceal diverticulum or renal cyst.  No hydronephrosis or hydroureter.  Colonic diverticulosis.  No definite evidence for colitis or diverticulitis.  Appendix not identified.  No right lower quadrant  inflammation.  No bowel obstruction.  The stomach is distended.  No free intraperitoneal air or fluid.  No lymphadenopathy.  Normal caliber aorta with minimal scattered atherosclerotic disease.  Thin-walled bladder.  No acute osseous finding.  Multilevel degenerative change.  IMPRESSION: Distended stomach is nonspecific.  May  reflect gastroparesis. Distal esophageal wall thickening can be seen in the setting of esophagitis or gastroesophageal reflux disease.  Normal nonenhanced appearance to the pancreas despite the elevated lipase.  Punctate interpolar left renal calcification may correspond to a stone within a calyceal diverticulum or cyst.  Recommend 16-month ultrasound follow-up.  Hepatic steatosis.   Original Report Authenticated By: Jearld Lesch, M.D.    Dg Abd Acute W/chest  01/15/2012  *RADIOLOGY REPORT*  Clinical Data: Abdominal pain, vomiting  ACUTE ABDOMEN SERIES (ABDOMEN 2 VIEW & CHEST 1 VIEW)  Comparison: Chest radiograph dated 01/01/2012.  Abdominal radiographs dated 08/22/2011.  Findings: Low lung volumes with vascular crowding.  No focal consolidation.  No pleural effusion or pneumothorax.  Cardiomediastinal silhouette is within normal limits.  Gastric distension.  Nonspecific bowel gas pattern without disproportionate small bowel dilatation to suggest small bowel obstruction.  No evidence of free air under the diaphragm on the upright view.  Visualized osseous structures are within normal limits.  IMPRESSION: No evidence of acute cardiopulmonary disease.  No evidence of small bowel obstruction or free air.  Gastric distension.   Original Report Authenticated By: Charline Bills, M.D.       [name], MD 02/13/12 [phone]

## 2012-02-13 NOTE — ED Notes (Signed)
Pt belongings placed behind nurses station in front of room 18.  Pt belongings as noted:  Bears hat, red t-shirt, red white and blue button up shirt, black shoes, pair of socks, black jacket, blue jeans.

## 2012-02-13 NOTE — ED Notes (Signed)
Pt and pt's belongings wanded by security.  

## 2012-02-13 NOTE — ED Notes (Addendum)
Pt sleeping. Respirations even and unlabored. Bilateral rise and fall of chest. Skin warm and dry. In no acute distress.   

## 2012-02-13 NOTE — ED Notes (Signed)
Pt's sister, Mohamad Bruso, called for an update.  Sts that she will pick up Pt when he is discharged.  Her cell phone number is [phone].

## 2012-02-13 NOTE — BH Assessment (Addendum)
Assessment Note   Shawn Baird is an 52 y.o. male. Pt presents to [facility] during this visit per Physician Assistant  Note  Initially; It is reported from the group home that the patient was yelling out and crying. Patient only say that he hurts all over and that his stomach hurts. He won't answer questions and continues to scream out and is crying. Patient is not able to be calmed down or consoled  Pt reports that he is in the ER because "sick on stomach". Pt presents confused and having difficulty processing and understanding questions being asked. Pt reports that he lives with his mom and five sisters. Epic notes indicate that pt lives in a group home. Pt presents calm and cooperative and was not at all aggressive,violent,yelling,or screaming during his assessment. It is unclear at this time as to why a behavioral health assessment was requested. Pt does not appear to be presenting with psychotic symptoms at this time. Pt denies SI and HI. Pt appears to have some developmental delays and it is noted that pt has a hx of being diagnosed as MR, there is no documentation in pt's chart to support this. Pt does not meet inpatient treatment criteria.   Consulted with Dr.Otter about disposition of pt and she reports that she will further evaluate situation to determine disposition.   Axis I: Mood Disorder NOS Axis II: Deferred Axis III:  Past Medical History  Diagnosis Date  . Diabetes mellitus   . Anxiety   . Hypertension   . Hyperlipemia   . Mental disorder   . Schizoaffective disorder   . Impulsive control disorder  . Pancreatitis   . Otitis media   . Cancer   . HOH (hard of hearing)    Axis IV: housing problems, other psychosocial or environmental problems and problems related to social environment Axis V: 51-60 moderate symptoms  Past Medical History:  Past Medical History  Diagnosis Date  . Diabetes mellitus   . Anxiety   . Hypertension   . Hyperlipemia   . Mental disorder   .  Schizoaffective disorder   . Impulsive control disorder  . Pancreatitis   . Otitis media   . Cancer   . HOH (hard of hearing)     History reviewed. No pertinent past surgical history.  Family History: History reviewed. No pertinent family history.  Social History:  reports that he has never smoked. He does not have any smokeless tobacco history on file. He reports that he does not drink alcohol or use illicit drugs.  Additional Social History:  Alcohol / Drug Use Pain Medications:  (See PTA med list and Mar) Prescriptions:  (See PTA med list and Mar) Over the Counter:  (See PTA med list Mar) History of alcohol / drug use?: No history of alcohol / drug abuse  CIWA: CIWA-Ar BP: 123/73 mmHg Pulse Rate: 96  COWS:    Allergies: No Known Allergies  Home Medications:  (Not in a hospital admission)  OB/GYN Status:  No LMP for male patient.  General Assessment Data Location of Assessment: [facility] ACT Assessment: Yes Living Arrangements: Other (Comment) ( sts he lives w/ his mom and 5 sisters??notes indicate gh) Can pt return to current living arrangement?: Yes Admission Status: Voluntary Transfer from: Group Home Referral Source: Other     Risk to self Suicidal Ideation: No Suicidal Intent: No Is patient at risk for suicide?: No Suicidal Plan?: No Access to Means: No What has been your use of drugs/alcohol  within the last 12 months?: None Previous Attempts/Gestures: No How many times?: 0  Other Self Harm Risks: None Triggers for Past Attempts: Unpredictable Intentional Self Injurious Behavior: None Family Suicide History: Unknown Recent stressful life event(s): Other (Comment) (Chronicity of mental health issues) Persecutory voices/beliefs?: No Depression: No Depression Symptoms:  (None reported) Substance abuse history and/or treatment for substance abuse?: No Suicide prevention information given to non-admitted patients: Not applicable  Risk to Others Homicidal  Ideation: No Thoughts of Harm to Others: No Current Homicidal Intent: No Current Homicidal Plan: No Access to Homicidal Means: No Identified Victim: none History of harm to others?: No Assessment of Violence: None Noted Violent Behavior Description: None Does patient have access to weapons?: No Criminal Charges Pending?: No Does patient have a court date: No  Psychosis Hallucinations: None noted Delusions: None noted  Mental Status Report Appear/Hygiene: Other (Comment) (Appropriate) Eye Contact: Fair Motor Activity: Other (Comment) (laying in bed calmly) Speech: Incoherent Level of Consciousness: Alert Mood: Other (Comment) (uta) Affect: Unable to Assess Anxiety Level: Minimal Thought Processes: Relevant;Irrelevant;Tangential Judgement: Impaired Orientation: Unable to assess Obsessive Compulsive Thoughts/Behaviors: None  Cognitive Functioning Concentration: Decreased Memory:  (uta) IQ:  (UTA) Insight: Poor Impulse Control: Poor Appetite: Fair Weight Loss: 0  Weight Gain: 0  Sleep:  (UTA) Total Hours of Sleep:  (pt has been sleeping in ER with no noted issues) Vegetative Symptoms: None  ADLScreening [facility] Assessment Services) Patient's cognitive ability adequate to safely complete daily activities?: Yes Patient able to express need for assistance with ADLs?: Yes Independently performs ADLs?: Yes (appropriate for developmental age)  Abuse/Neglect [facility]) Physical Abuse: Denies Verbal Abuse: Denies Sexual Abuse: Denies  Prior Inpatient Therapy Prior Inpatient Therapy: No Prior Therapy Dates: no Prior Therapy Facilty/Provider(s): no Reason for Treatment: no  Prior Outpatient Therapy Prior Outpatient Therapy: No Prior Therapy Dates: None Prior Therapy Facilty/Provider(s): None Reason for Treatment: None  ADL Screening (condition at time of admission) Patient's cognitive ability adequate to safely complete daily activities?: Yes Patient able to express need  for assistance with ADLs?: Yes Independently performs ADLs?: Yes (appropriate for developmental age) Weakness of Legs: None Weakness of Arms/Hands: None       Abuse/Neglect Assessment (Assessment to be complete while patient is alone) Physical Abuse: Denies Verbal Abuse: Denies Sexual Abuse: Denies Values / Beliefs Cultural Requests During Hospitalization:  (unknow) Spiritual Requests During Hospitalization:  (unknown)   Advance Directives (For Healthcare) Advance Directive: Patient does not have advance directive    Additional Information 1:1 In Past 12 Months?: No CIRT Risk: No Elopement Risk: No Does patient have medical clearance?: Yes     Disposition:  Disposition Disposition of Patient: Other dispositions (Consult with EDP for disposition-pending) Patient referred to:  (Consulted with Dr.Otter who will recomend disposition)  On Site Evaluation by:   Reviewed with Physician:     Bjorn Pippin 02/13/2012 5:45 AM

## 2012-02-13 NOTE — ED Provider Notes (Addendum)
Shawn Baird is a 52 y.o. male who is being evaluated for psychosis. He's been seen, and stabilized. Plans are being made to return him to his group home.  He has been seen by ACT. The group home has agreed to take him back.     Flint Melter, MD 02/13/12 1610  Flint Melter, MD 02/13/12 [phone]

## 2012-02-13 NOTE — ED Provider Notes (Signed)
History     CSN: [id number]  Arrival date & time 02/13/12  2137   First MD Initiated Contact with Patient 02/13/12 2220      Chief Complaint  Patient presents with  . Altered Mental Status   HPI  History provided by medical charts, EMS report, staff at [facility] and patient. Patient is a 52 year old male with history of mental retardation, schizoaffective disorder, hypertension, hyperlipidemia and diabetes who returns to the emergency room under IVC papers. Patient was seen earlier today for behavioral outbursts at his group living facility and concerns for abdominal pain. Patient had an extensive workup in the emergency room department to include lab testing and a CT scan of abdomen pelvis. Findings were unremarkable for any acute or emergent cause of symptoms. Patient did have slight distention of the stomach leading to some concern for possible gastroparesis. Patient has had similar symptoms in the past of abdominal pains. Staff at group home report that when patient appears to be uncomfortable with stomach pain he becomes disruptive and disobedient. After returning home the emergency room patient was initially calm and cooperative but later began developed disruptive behavior. Patient was throwing objects and yelling at staff. Staff was concerned of other people safety and so called 911 to have patient brought back to the emergency room. Patient is a poor historian but states that he was unhappy with being told what to do after returning to the group home. Patient states that he sometimes does not get along with other people there. He currently denies any abdominal pain.     Past Medical History  Diagnosis Date  . Diabetes mellitus   . Anxiety   . Hypertension   . Hyperlipemia   . Mental disorder   . Schizoaffective disorder   . Impulsive control disorder  . Pancreatitis   . Otitis media   . Cancer   . HOH (hard of hearing)     History reviewed. No pertinent past  surgical history.  No family history on file.  History  Substance Use Topics  . Smoking status: Never Smoker   . Smokeless tobacco: Not on file  . Alcohol Use: No      Review of Systems  All other systems reviewed and are negative.    Allergies  Review of patient's allergies indicates no known allergies.  Home Medications   Current Outpatient Rx  Name  Route  Sig  Dispense  Refill  . AMLODIPINE BESYLATE 5 MG PO TABS   Oral   Take 5 mg by mouth every morning.         . ASPIRIN 81 MG PO CHEW   Oral   Chew 81 mg by mouth every morning.         Marland Kitchen HYDROCODONE-ACETAMINOPHEN 5-325 MG PO TABS   Oral   Take 1 tablet by mouth every 4 (four) hours as needed. For sleep         . LORATADINE 10 MG PO TABS   Oral   Take 10 mg by mouth every morning.          . LUBIPROSTONE 24 MCG PO CAPS   Oral   Take 24 mcg by mouth 2 (two) times daily with a meal.         . METFORMIN HCL ER 500 MG PO TB24   Oral   Take 500 mg by mouth daily with breakfast.         . METOCLOPRAMIDE HCL 10 MG PO TABS  Oral   Take 10 mg by mouth 4 (four) times daily as needed. For nausea         . OMEPRAZOLE 20 MG PO CPDR   Oral   Take 20 mg by mouth daily.         Marland Kitchen POLYETHYLENE GLYCOL 3350 PO PACK   Oral   Take 17 g by mouth every morning.         Marland Kitchen PRAVASTATIN SODIUM 20 MG PO TABS   Oral   Take 20 mg by mouth at bedtime.          Marland Kitchen VITAMIN B-12 1000 MCG PO TABS   Oral   Take 1,000 mcg by mouth every morning.          Marland Kitchen ZOLPIDEM TARTRATE 5 MG PO TABS   Oral   Take 5 mg by mouth at bedtime as needed. For sleep           BP 136/102  Pulse 116  Temp 97.8 F (36.6 C) (Oral)  Resp 26  SpO2 97%  Physical Exam  Nursing note and vitals reviewed. Constitutional: He appears well-developed and well-nourished. No distress.  HENT:  Head: Normocephalic.  Cardiovascular: Normal rate and regular rhythm.   Pulmonary/Chest: Effort normal and breath sounds normal.    Abdominal: Soft. There is no tenderness. There is no rebound and no guarding.       Obese. Patient does not appear to have any discomfort during palpation.  Neurological: He is alert.       Patient appears to be at baseline mental ability.  Psychiatric: He has a normal mood and affect. His behavior is normal.    ED Course  Procedures   Ct Abdomen Pelvis Wo Contrast  02/12/2012  *RADIOLOGY REPORT*  Clinical Data: Abdominal pain.  CT ABDOMEN AND PELVIS WITHOUT CONTRAST  Technique:  Multidetector CT imaging of the abdomen and pelvis was performed following the standard protocol without intravenous contrast.  Comparison: 01/15/2012  Findings: There is atelectasis or scarring at the left lung base. Findings at the left lung base may represent postinflammatory changes.  There is no evidence for free intraperitoneal air. Unenhanced CT was performed per clinician order.  Lack of IV contrast limits sensitivity and specificity, especially for evaluation of abdominal/pelvic solid viscera.  Images of upper abdomen are mildly limited due to artifact from the patient's overlying arms.  There is no gross abnormality to the liver, gallbladder, spleen, pancreas, adrenal glands or kidneys.  No gross abnormality to the prostate or urinary bladder.  No evidence for free fluid or lymphadenopathy.  Small lymph nodes in the mesentery are nonspecific.  The stomach is moderately distended.  There is no significant bowel dilatation.  Stable areas of sclerosis in the right iliac bone.  No acute bony abnormality.  IMPRESSION: No acute abnormalities within the abdomen or pelvis.  Scarring or postinflammatory changes at the left lung base.   Original Report Authenticated By: Richarda Overlie, M.D.      1. Behavioral problem       MDM  Patient seen and evaluated. Patient currently resting comfortably does not appear in any acute distress or discomfort. He is cooperating.  Patient was seen earlier in the day for similar  complaints of behavioral outbursts and concerns for abdominal discomforts. Patient had extensive workup including lab tests and CT scan. He was also evaluated by [facility] act team.  It was felt that patient may have symptoms related to gastroparesis. He was given a prescription  for are going to try to help with symptoms. Family and patient instructed to seek continued evaluation by PCP to workup these episodes.   I spoke with the staff at his group home who states that he has these similar symptoms occasionally. Patient was becoming aggressive and throwing objects at the home and staff were concerned about other people safety. They do feel capable of taking the patient back home tonight.  IVC paper rescinded.      [name], [place] 02/14/12 [phone]

## 2012-02-13 NOTE — ED Notes (Signed)
Per EMS, pt resides at [facility] and was acting up throwing items and yelling. Staff called EMS. Pt is laughing or crying hysterically. Pt acting like he is hurting but refuses to say where he is hurting. Pt extremely thirsty. Drank 40 oz while at the facility and enroute.

## 2012-02-14 ENCOUNTER — Emergency Department (HOSPITAL_COMMUNITY)
Admission: EM | Admit: 2012-02-14 | Discharge: 2012-02-17 | Disposition: A | Discharge status: Home / Self Care | Payer: Medicare Other | Attending: Emergency Medicine | Admitting: Emergency Medicine

## 2012-02-14 ENCOUNTER — Encounter (HOSPITAL_COMMUNITY): Payer: Self-pay | Admitting: Emergency Medicine

## 2012-02-14 DIAGNOSIS — F411 Generalized anxiety disorder: Secondary | ICD-10-CM | POA: Insufficient documentation

## 2012-02-14 DIAGNOSIS — F259 Schizoaffective disorder, unspecified: Secondary | ICD-10-CM | POA: Insufficient documentation

## 2012-02-14 DIAGNOSIS — Z859 Personal history of malignant neoplasm, unspecified: Secondary | ICD-10-CM | POA: Insufficient documentation

## 2012-02-14 DIAGNOSIS — F79 Unspecified intellectual disabilities: Secondary | ICD-10-CM | POA: Insufficient documentation

## 2012-02-14 DIAGNOSIS — Z8719 Personal history of other diseases of the digestive system: Secondary | ICD-10-CM | POA: Insufficient documentation

## 2012-02-14 DIAGNOSIS — F8089 Other developmental disorders of speech and language: Secondary | ICD-10-CM | POA: Insufficient documentation

## 2012-02-14 DIAGNOSIS — Z7982 Long term (current) use of aspirin: Secondary | ICD-10-CM | POA: Insufficient documentation

## 2012-02-14 DIAGNOSIS — H6690 Otitis media, unspecified, unspecified ear: Secondary | ICD-10-CM | POA: Insufficient documentation

## 2012-02-14 DIAGNOSIS — Z79899 Other long term (current) drug therapy: Secondary | ICD-10-CM | POA: Insufficient documentation

## 2012-02-14 DIAGNOSIS — I10 Essential (primary) hypertension: Secondary | ICD-10-CM | POA: Insufficient documentation

## 2012-02-14 DIAGNOSIS — F911 Conduct disorder, childhood-onset type: Secondary | ICD-10-CM | POA: Insufficient documentation

## 2012-02-14 DIAGNOSIS — E119 Type 2 diabetes mellitus without complications: Secondary | ICD-10-CM | POA: Insufficient documentation

## 2012-02-14 DIAGNOSIS — E785 Hyperlipidemia, unspecified: Secondary | ICD-10-CM | POA: Insufficient documentation

## 2012-02-14 DIAGNOSIS — Z8669 Personal history of other diseases of the nervous system and sense organs: Secondary | ICD-10-CM | POA: Insufficient documentation

## 2012-02-14 DIAGNOSIS — F952 Tourette's disorder: Secondary | ICD-10-CM | POA: Insufficient documentation

## 2012-02-14 DIAGNOSIS — F29 Unspecified psychosis not due to a substance or known physiological condition: Secondary | ICD-10-CM | POA: Insufficient documentation

## 2012-02-14 HISTORY — DX: Unspecified intellectual disabilities: F79

## 2012-02-14 HISTORY — DX: Tourette's disorder: F95.2

## 2012-02-14 LAB — COMPREHENSIVE METABOLIC PANEL
AST: 32 U/L (ref 0–37)
Albumin: 4.1 g/dL (ref 3.5–5.2)
BUN: 18 mg/dL (ref 6–23)
CO2: 21 mEq/L (ref 19–32)
Calcium: 9.4 mg/dL (ref 8.4–10.5)
Creatinine, Ser: 1.56 mg/dL — ABNORMAL HIGH (ref 0.50–1.35)
GFR calc non Af Amer: 49 mL/min — ABNORMAL LOW (ref >90)
Total Bilirubin: 0.6 mg/dL (ref 0.3–1.2)

## 2012-02-14 LAB — RAPID URINE DRUG SCREEN, HOSP PERFORMED
Cocaine: NOT DETECTED
Opiates: NOT DETECTED
Tetrahydrocannabinol: NOT DETECTED

## 2012-02-14 LAB — CBC WITH DIFFERENTIAL/PLATELET
Basophils Absolute: 0 10*3/uL (ref 0.0–0.1)
Basophils Relative: 1 % (ref 0–1)
Eosinophils Relative: 1 % (ref 0–5)
HCT: 46.9 % (ref 39.0–52.0)
Hemoglobin: 16.3 g/dL (ref 13.0–17.0)
MCHC: 34.8 g/dL (ref 30.0–36.0)
MCV: 84.2 fL (ref 78.0–100.0)
Monocytes Absolute: 0.9 10*3/uL (ref 0.1–1.0)
Monocytes Relative: 11 % (ref 3–12)
RDW: 14.2 % (ref 11.5–15.5)

## 2012-02-14 LAB — ACETAMINOPHEN LEVEL: Acetaminophen (Tylenol), Serum: <15 ug/mL (ref 10–30)

## 2012-02-14 LAB — URINALYSIS, ROUTINE W REFLEX MICROSCOPIC
Leukocytes, UA: NEGATIVE
Nitrite: NEGATIVE
Protein, ur: 30 mg/dL — AB
Specific Gravity, Urine: 1.024 (ref 1.005–1.030)
Urobilinogen, UA: 0.2 mg/dL (ref 0.0–1.0)

## 2012-02-14 LAB — URINE MICROSCOPIC-ADD ON

## 2012-02-14 MED ORDER — LORAZEPAM 2 MG/ML IJ SOLN
1.0000 mg | INTRAMUSCULAR | Status: DC | PRN
  Administered 2012-02-14: 1 mg via INTRAMUSCULAR
  Filled 2012-02-14: qty 1

## 2012-02-14 MED ORDER — HYDROCODONE-ACETAMINOPHEN 5-325 MG PO TABS
1.0000 | ORAL_TABLET | ORAL | Status: DC | PRN
Start: 2012-02-14 — End: 2012-02-17

## 2012-02-14 MED ORDER — SIMVASTATIN 10 MG PO TABS
10.0000 mg | ORAL_TABLET | Freq: Every day | ORAL | Status: DC
  Administered 2012-02-15 – 2012-02-16 (×2): 10 mg via ORAL
  Filled 2012-02-14 (×3): qty 1

## 2012-02-14 MED ORDER — METFORMIN HCL ER 500 MG PO TB24
500.0000 mg | ORAL_TABLET | Freq: Every day | ORAL | Status: DC
  Administered 2012-02-15 – 2012-02-17 (×3): 500 mg via ORAL
  Filled 2012-02-14 (×4): qty 1

## 2012-02-14 MED ORDER — ZIPRASIDONE MESYLATE 20 MG IM SOLR: 10.0000 mg | INTRAMUSCULAR | Status: DC | PRN

## 2012-02-14 MED ORDER — ASPIRIN 81 MG PO CHEW
81.0000 mg | CHEWABLE_TABLET | Freq: Every morning | ORAL | Status: DC
  Administered 2012-02-15 – 2012-02-17 (×3): 81 mg via ORAL
  Filled 2012-02-14 (×3): qty 1

## 2012-02-14 MED ORDER — DICYCLOMINE HCL 20 MG PO TABS: 20.0000 mg | ORAL_TABLET | Freq: Two times a day (BID) | ORAL | Status: DC

## 2012-02-14 MED ORDER — ZIPRASIDONE MESYLATE 20 MG IM SOLR
10.0000 mg | Freq: Once | INTRAMUSCULAR | Status: AC
  Administered 2012-02-14: 10 mg via INTRAMUSCULAR
  Filled 2012-02-14: qty 20

## 2012-02-14 MED ORDER — PANTOPRAZOLE SODIUM 40 MG PO TBEC
40.0000 mg | DELAYED_RELEASE_TABLET | Freq: Every day | ORAL | Status: DC
  Administered 2012-02-15 – 2012-02-17 (×3): 40 mg via ORAL
  Filled 2012-02-14 (×4): qty 1

## 2012-02-14 MED ORDER — LORAZEPAM 2 MG/ML IJ SOLN
1.0000 mg | Freq: Once | INTRAMUSCULAR | Status: AC
  Administered 2012-02-14: 1 mg via INTRAMUSCULAR
  Filled 2012-02-14: qty 1

## 2012-02-14 MED ORDER — ZOLPIDEM TARTRATE 5 MG PO TABS
5.0000 mg | ORAL_TABLET | Freq: Every evening | ORAL | Status: DC | PRN
  Administered 2012-02-15 – 2012-02-16 (×2): 5 mg via ORAL
  Filled 2012-02-14 (×2): qty 1

## 2012-02-14 MED ORDER — METOCLOPRAMIDE HCL 10 MG PO TABS
10.0000 mg | ORAL_TABLET | Freq: Four times a day (QID) | ORAL | Status: DC | PRN
  Administered 2012-02-15: 10 mg via ORAL
  Filled 2012-02-14: qty 1

## 2012-02-14 MED ORDER — AMLODIPINE BESYLATE 5 MG PO TABS
5.0000 mg | ORAL_TABLET | Freq: Every morning | ORAL | Status: DC
  Administered 2012-02-15 – 2012-02-17 (×3): 5 mg via ORAL
  Filled 2012-02-14 (×3): qty 1

## 2012-02-14 NOTE — ED Provider Notes (Signed)
Medical screening examination/treatment/procedure(s) were performed by non-physician practitioner and as supervising physician I was immediately available for consultation/collaboration.  Doug Sou, MD 02/14/12 [phone]
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
## 2012-02-14 NOTE — ED Notes (Signed)
Report called to [facility]

## 2012-02-14 NOTE — ED Notes (Signed)
Pt resting with eyes closed, respirations normal on assessment.
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
## 2012-02-14 NOTE — ED Notes (Signed)
Unable to take BP due to pt being restless. MD left bedside.

## 2012-02-14 NOTE — ED Notes (Signed)
[id number]> Expected date:<BR> Expected time:<BR> Means of arrival:<BR> Comments:<BR> Psy pt

## 2012-02-14 NOTE — ED Notes (Signed)
cbg [id number] 

## 2012-02-14 NOTE — ED Notes (Signed)
[facility] called for transport.  

## 2012-02-14 NOTE — ED Notes (Signed)
Patient is yelling out from room. Assessed and irritated by socks.
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
## 2012-02-14 NOTE — ED Notes (Addendum)
Pt resides in [facility]. [phone] Mattis Featherly, sister/legal guardian  Home [phone]  Cell [phone] Work [phone]

## 2012-02-15 ENCOUNTER — Encounter (HOSPITAL_COMMUNITY): Payer: Self-pay | Admitting: Emergency Medicine

## 2012-02-15 LAB — GLUCOSE, CAPILLARY: Glucose-Capillary: 115 mg/dL — ABNORMAL HIGH (ref 70–99)

## 2012-02-15 MED ORDER — ZIPRASIDONE MESYLATE 20 MG IM SOLR: 20.0000 mg | Freq: Three times a day (TID) | INTRAMUSCULAR | Status: DC | PRN

## 2012-02-15 MED ORDER — ONDANSETRON 4 MG PO TBDP
4.0000 mg | ORAL_TABLET | Freq: Once | ORAL | Status: AC
  Administered 2012-02-15: 4 mg via ORAL
  Filled 2012-02-15: qty 1

## 2012-02-15 MED ORDER — ZIPRASIDONE MESYLATE 20 MG IM SOLR: 20.0000 mg | Freq: Four times a day (QID) | INTRAMUSCULAR | Status: DC | PRN

## 2012-02-15 MED ORDER — LORAZEPAM 2 MG/ML IJ SOLN
2.0000 mg | Freq: Four times a day (QID) | INTRAMUSCULAR | Status: DC | PRN
  Administered 2012-02-15: 2 mg via INTRAMUSCULAR
  Filled 2012-02-15: qty 1

## 2012-02-15 MED ORDER — INSULIN ASPART 100 UNIT/ML ~~LOC~~ SOLN
0.0000 [IU] | Freq: Three times a day (TID) | SUBCUTANEOUS | Status: DC
  Administered 2012-02-15: 3 [IU] via SUBCUTANEOUS
  Administered 2012-02-16 (×2): 2 [IU] via SUBCUTANEOUS
  Administered 2012-02-16: 3 [IU] via SUBCUTANEOUS
  Administered 2012-02-17 (×2): 2 [IU] via SUBCUTANEOUS
  Filled 2012-02-15 (×7): qty 1

## 2012-02-15 NOTE — BHH Counselor (Signed)
[facility]'s telepsych eval recommends inpatient treatment. RN and EDP notified.  Evette Cristal, [place] Assessment Counselor

## 2012-02-15 NOTE — Plan of Care (Signed)
[id number]   Shawn Baird    [birth year]   [name] was presented for admission at [facility] for "behavioral problems" after being seen in the ED at [facility] for Abdominal pain.  After careful review of his chart he is declined for admission to [facility] due to his inability to participate and benefit from programming on the unit. His violent outbursts and limited intellectual capacity require a higher level of care than we currently provide at [facility].  Recommendations: 1. Recommend if MD still feels he needs in patient admission to facility that has specific programming for DD people who are mentally retarded. 2. Recommend MD use po Risperdal for agitation. 3. Recommend that if patient has an ACT team that they be consulted to have him follow up sooner with his out patient psychiatrist for a change in medication management to include an antipsychotic medication. Long acting injectable is the suggested option.  Thank you, Lloyd Huger T.  [facility] 02/15/2012 8:42 AM

## 2012-02-15 NOTE — ED Provider Notes (Signed)
8:37 AM Patient awake, ambulatory, fiddling with equipment.  He states that he is comfortable.  VSS  He is awaiting placement.  1/20 telepsych rec's inpatient admission   Gerhard Munch, MD 02/15/12 [phone]

## 2012-02-15 NOTE — BHH Counselor (Signed)
Per Chris([facility]), pt declined by Julieanne Cotton O(NP) at [facility] due to acuity.

## 2012-02-15 NOTE — BHH Counselor (Signed)
Referred to [facility]; declined by Verne Spurr, PA due to MR. Pt referred to [facility], [facility], and [facility] pending review.

## 2012-02-15 NOTE — ED Notes (Signed)
Patient complains of abdominal pain, nausea and increase anxiety. Patient medicated per MAR. Patient was seen vomiting in trash can. Patient states that "I am throwing up because of all that food I ate".

## 2012-02-16 LAB — GLUCOSE, CAPILLARY
Glucose-Capillary: 124 mg/dL — ABNORMAL HIGH (ref 70–99)
Glucose-Capillary: 156 mg/dL — ABNORMAL HIGH (ref 70–99)

## 2012-02-16 NOTE — ED Provider Notes (Signed)
Medical screening examination/treatment/procedure(s) were performed by non-physician practitioner and as supervising physician I was immediately available for consultation/collaboration.    L , MD 02/16/12 0718 

## 2012-02-16 NOTE — ED Notes (Signed)
Staff member called and reported that patient has not fully grieved his mother death whom died in 08/02/2022.
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
## 2012-02-16 NOTE — ED Provider Notes (Signed)
Patient in no acute distress. Patient has been recommended for inpatient care still waiting for placement.  Shelda Jakes, MD 02/16/12 [phone]

## 2012-02-17 DIAGNOSIS — F39 Unspecified mood [affective] disorder: Secondary | ICD-10-CM

## 2012-02-17 LAB — GLUCOSE, CAPILLARY
Glucose-Capillary: 128 mg/dL — ABNORMAL HIGH (ref 70–99)
Glucose-Capillary: 132 mg/dL — ABNORMAL HIGH (ref 70–99)
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
## 2012-02-17 NOTE — Consult Note (Signed)
Reason for Consult: Behavioral problems Referring Physician: Dr. Jamal Maes Shawn Baird is an 52 y.o. male.  HPI: Patient was seen today. Patient has no reported behavioral or emotional problems as for the staff. Patient has no complaints today. Patient is willing to go back to the group home and followup with outpatient psychiatric services. LCSW had contacted Group home staff who agreed to take him back as he is been stable clinically in the emergency department. Patient has no reported irritability, agitation, aggressive behavior. Patient has no suicidal or homicidal ideation.  MSE: Patient was calm, quite cooperative. Patient appeared sitting in the chair watching TV without  distress. He stated mood is fine. His affect was constricted. Is normal. His speech but they're difficult to communicate because of a hard of hearing. He has not have any active gout symptoms. He has not appeared psychotic.  Past Medical History  Diagnosis Date  . Diabetes mellitus   . Anxiety   . Hypertension   . Hyperlipemia   . Mental disorder   . Schizoaffective disorder   . Impulsive control disorder  . Pancreatitis   . Otitis media   . Cancer   . HOH (hard of hearing)   . Tourette's disease   . Mental retardation     History reviewed. No pertinent past surgical history.  No family history on file.  Social History:  reports that he has never smoked. He does not have any smokeless tobacco history on file. He reports that he does not drink alcohol or use illicit drugs.  Allergies: No Known Allergies  Medications: I have reviewed the patient's current medications.  Results for orders placed during the hospital encounter of 02/14/12 (from the past 48 hour(s))  GLUCOSE, CAPILLARY     Status: Abnormal   Collection Time   02/15/12  6:03 PM      Component Value Range Comment   Glucose-Capillary 130 (*) 70 - 99 mg/dL   GLUCOSE, CAPILLARY     Status: Abnormal   Collection Time   02/15/12 10:05 PM   Component Value Range Comment   Glucose-Capillary 141 (*) 70 - 99 mg/dL   GLUCOSE, CAPILLARY     Status: Abnormal   Collection Time   02/16/12  8:25 AM      Component Value Range Comment   Glucose-Capillary 124 (*) 70 - 99 mg/dL   GLUCOSE, CAPILLARY     Status: Abnormal   Collection Time   02/16/12 12:49 PM      Component Value Range Comment   Glucose-Capillary 133 (*) 70 - 99 mg/dL   GLUCOSE, CAPILLARY     Status: Abnormal   Collection Time   02/16/12  5:51 PM      Component Value Range Comment   Glucose-Capillary 156 (*) 70 - 99 mg/dL   GLUCOSE, CAPILLARY     Status: Abnormal   Collection Time   02/17/12  4:36 AM      Component Value Range Comment   Glucose-Capillary 128 (*) 70 - 99 mg/dL   GLUCOSE, CAPILLARY     Status: Abnormal   Collection Time   02/17/12  8:07 AM      Component Value Range Comment   Glucose-Capillary 121 (*) 70 - 99 mg/dL   GLUCOSE, CAPILLARY     Status: Abnormal   Collection Time   02/17/12 12:25 PM      Component Value Range Comment   Glucose-Capillary 132 (*) 70 - 99 mg/dL     No results found.  Positive for behavior problems Blood pressure 133/91, pulse 73, temperature 97.6 F (36.4 C), temperature source Oral, resp. rate 17, SpO2 98.00%.   Assessment/Plan: Assessment/Plan: Mood disorder NOS Schizoaffective disorder by history and 00 he Mental retardation by history  Recommendation. Patient will continue current medication management and LCSW will contact the group home and guardian. Patient will be sent back to group home as he has no further behavioral or emotional problems and has follow up with [facility] mental health.   ,[name] 02/17/2012, 5:04 PM

## 2012-02-17 NOTE — ED Provider Notes (Signed)
Pt was evaluated by Dr Shela Commons, psychiatry.  He recommends discharge.  Please see his note.  Celene Kras, MD 02/17/12 [phone]

## 2012-02-17 NOTE — ED Provider Notes (Signed)
7:52 AM Filed Vitals:   02/17/12 0539  BP:   Pulse: 91  Temp: 98 F (36.7 C)  Resp: 18   No AM complaints. telepsych recommends inpatient. Awaiting placement  Lyanne Co, MD 02/17/12 [phone]

## 2012-02-17 NOTE — BHH Suicide Risk Assessment (Signed)
Suicide Risk Assessment  Discharge Assessment     Demographic Factors:  Male, Adolescent or young adult and Low socioeconomic status  Mental Status Per Nursing Assessment::   On Admission:     Current Mental Status by Physician: NA  Loss Factors: Financial problems/change in socioeconomic status  Historical Factors: NA  Risk Reduction Factors:   Living with another person, especially a relative, Positive social support and Positive therapeutic relationship  Continued Clinical Symptoms:  Schizophrenia:   Paranoid or undifferentiated type Previous Psychiatric Diagnoses and Treatments Medical Diagnoses and Treatments/Surgeries  Cognitive Features That Contribute To Risk:  Polarized thinking    Suicide Risk:  Minimal: No identifiable suicidal ideation.  Patients presenting with no risk factors but with morbid ruminations; may be classified as minimal risk based on the severity of the depressive symptoms  Discharge Diagnoses:   AXIS I:  Schizoaffective Disorder AXIS II:  Mental retardation, severity unknown AXIS III:   Past Medical History  Diagnosis Date  . Diabetes mellitus   . Anxiety   . Hypertension   . Hyperlipemia   . Mental disorder   . Schizoaffective disorder   . Impulsive control disorder  . Pancreatitis   . Otitis media   . Cancer   . HOH (hard of hearing)   . Tourette's disease   . Mental retardation    AXIS IV:  economic problems, educational problems, occupational problems, problems related to social environment and problems with access to health care services AXIS V:  41-50 serious symptoms  Plan Of Care/Follow-up recommendations:  Activity:  As tolerated Diet:  Regular   Is patient on multiple antipsychotic therapies at discharge:  No   Has Patient had three or more failed trials of antipsychotic monotherapy by history:  No  Recommended Plan for Multiple Antipsychotic Therapies: Not applicable  ,[name] 02/17/2012, 5:11  PM

## 2012-02-17 NOTE — ED Notes (Signed)
Pt discharged home with caregiver. Instructions reviewed. Pt. Verbalized understanding. Denies SI/HI. No evidence of psychosis.

## 2012-03-08 ENCOUNTER — Emergency Department (HOSPITAL_COMMUNITY)
Admission: EM | Admit: 2012-03-08 | Discharge: 2012-03-08 | Disposition: A | Discharge status: Nursing Home (Includes ICF) | Payer: Medicare Other | Source: Home / Self Care | Attending: Emergency Medicine | Admitting: Emergency Medicine

## 2012-03-08 ENCOUNTER — Emergency Department (HOSPITAL_COMMUNITY): Payer: Medicare Other

## 2012-03-08 ENCOUNTER — Encounter (HOSPITAL_COMMUNITY): Payer: Self-pay | Admitting: *Deleted

## 2012-03-08 DIAGNOSIS — I10 Essential (primary) hypertension: Secondary | ICD-10-CM | POA: Insufficient documentation

## 2012-03-08 DIAGNOSIS — F259 Schizoaffective disorder, unspecified: Secondary | ICD-10-CM | POA: Insufficient documentation

## 2012-03-08 DIAGNOSIS — E785 Hyperlipidemia, unspecified: Secondary | ICD-10-CM | POA: Insufficient documentation

## 2012-03-08 DIAGNOSIS — Z8669 Personal history of other diseases of the nervous system and sense organs: Secondary | ICD-10-CM | POA: Insufficient documentation

## 2012-03-08 DIAGNOSIS — R7989 Other specified abnormal findings of blood chemistry: Secondary | ICD-10-CM | POA: Insufficient documentation

## 2012-03-08 DIAGNOSIS — Z8659 Personal history of other mental and behavioral disorders: Secondary | ICD-10-CM | POA: Insufficient documentation

## 2012-03-08 DIAGNOSIS — R748 Abnormal levels of other serum enzymes: Secondary | ICD-10-CM

## 2012-03-08 DIAGNOSIS — Z79899 Other long term (current) drug therapy: Secondary | ICD-10-CM | POA: Insufficient documentation

## 2012-03-08 DIAGNOSIS — Z8719 Personal history of other diseases of the digestive system: Secondary | ICD-10-CM | POA: Insufficient documentation

## 2012-03-08 DIAGNOSIS — H9190 Unspecified hearing loss, unspecified ear: Secondary | ICD-10-CM | POA: Insufficient documentation

## 2012-03-08 DIAGNOSIS — K3184 Gastroparesis: Secondary | ICD-10-CM

## 2012-03-08 DIAGNOSIS — Z7982 Long term (current) use of aspirin: Secondary | ICD-10-CM | POA: Insufficient documentation

## 2012-03-08 DIAGNOSIS — F411 Generalized anxiety disorder: Secondary | ICD-10-CM | POA: Insufficient documentation

## 2012-03-08 DIAGNOSIS — Z859 Personal history of malignant neoplasm, unspecified: Secondary | ICD-10-CM | POA: Insufficient documentation

## 2012-03-08 DIAGNOSIS — F952 Tourette's disorder: Secondary | ICD-10-CM | POA: Insufficient documentation

## 2012-03-08 DIAGNOSIS — F71 Moderate intellectual disabilities: Secondary | ICD-10-CM | POA: Insufficient documentation

## 2012-03-08 DIAGNOSIS — E1149 Type 2 diabetes mellitus with other diabetic neurological complication: Secondary | ICD-10-CM | POA: Insufficient documentation

## 2012-03-08 LAB — URINALYSIS, ROUTINE W REFLEX MICROSCOPIC
Bilirubin Urine: NEGATIVE
Glucose, UA: NEGATIVE mg/dL
Hgb urine dipstick: NEGATIVE
Protein, ur: NEGATIVE mg/dL

## 2012-03-08 LAB — CBC WITH DIFFERENTIAL/PLATELET
Basophils Absolute: 0 10*3/uL (ref 0.0–0.1)
HCT: 44.9 % (ref 39.0–52.0)
Lymphocytes Relative: 8 % — ABNORMAL LOW (ref 12–46)
Lymphs Abs: 0.8 10*3/uL (ref 0.7–4.0)
Neutro Abs: 7.2 10*3/uL (ref 1.7–7.7)
Platelets: 302 10*3/uL (ref 150–400)
RBC: 5.29 MIL/uL (ref 4.22–5.81)
RDW: 14.1 % (ref 11.5–15.5)
WBC: 9 10*3/uL (ref 4.0–10.5)

## 2012-03-08 LAB — COMPREHENSIVE METABOLIC PANEL
ALT: 197 U/L — ABNORMAL HIGH (ref 0–53)
AST: 220 U/L — ABNORMAL HIGH (ref 0–37)
Alkaline Phosphatase: 153 U/L — ABNORMAL HIGH (ref 39–117)
CO2: 22 mEq/L (ref 19–32)
Chloride: 104 mEq/L (ref 96–112)
GFR calc non Af Amer: 54 mL/min — ABNORMAL LOW (ref >90)
Glucose, Bld: 125 mg/dL — ABNORMAL HIGH (ref 70–99)
Sodium: 139 mEq/L (ref 135–145)
Total Bilirubin: 0.4 mg/dL (ref 0.3–1.2)

## 2012-03-08 LAB — RAPID URINE DRUG SCREEN, HOSP PERFORMED
Amphetamines: NOT DETECTED
Barbiturates: NOT DETECTED
Opiates: POSITIVE — AB
Tetrahydrocannabinol: NOT DETECTED

## 2012-03-08 MED ORDER — MORPHINE SULFATE 4 MG/ML IJ SOLN
4.0000 mg | Freq: Once | INTRAMUSCULAR | Status: AC
  Administered 2012-03-08: 4 mg via INTRAVENOUS
  Filled 2012-03-08: qty 1

## 2012-03-08 MED ORDER — METOCLOPRAMIDE HCL 5 MG/ML IJ SOLN
10.0000 mg | Freq: Once | INTRAMUSCULAR | Status: AC
  Administered 2012-03-08: 10 mg via INTRAVENOUS
  Filled 2012-03-08: qty 2

## 2012-03-08 MED ORDER — ONDANSETRON 4 MG PO TBDP: 4.0000 mg | ORAL_TABLET | Freq: Three times a day (TID) | ORAL | Status: DC | PRN

## 2012-03-08 MED ORDER — ONDANSETRON HCL 4 MG/2ML IJ SOLN
4.0000 mg | Freq: Once | INTRAMUSCULAR | Status: AC
Start: 2012-03-08 — End: 2012-03-08
  Administered 2012-03-08: 4 mg via INTRAVENOUS
  Filled 2012-03-08: qty 2

## 2012-03-08 NOTE — ED Notes (Signed)
[facility] NT report given.

## 2012-03-08 NOTE — ED Notes (Signed)
Ultrasound at bedside

## 2012-03-08 NOTE — ED Provider Notes (Signed)
History  This chart was scribed for [name], MD by Shari Heritage, ED Scribe. The patient was seen in room WA03/WA03. Patient's care was started at 1743.   CSN: [id number]  Arrival date & time 03/08/12  1731   First MD Initiated Contact with Patient 03/08/12 1743      Chief Complaint  Patient presents with  . Abdominal Pain    Patient is a 52 y.o. male presenting with abdominal pain. The history is provided by the patient. No language interpreter was used.  Abdominal Pain Pain location:  Generalized Pain radiates to:  Does not radiate Pain severity:  Moderate Onset quality:  Unable to specify Timing:  Unable to specify Progression:  Unable to specify Chronicity:  New Associated symptoms: no constipation and no diarrhea     HPI Comments: Shawn Baird is a 52 y.o. male with history of mental retardation who presents to the Emergency Department complaining of poorly characterized, moderate, generalized abdominal pain onset this morning. Patient's last BM was this morning. He denies diarrhea or constipation. Patient denies SI or HI at this time. Patient has been seen multiple times for the same type of abdominal pain. He has had multiple scans that indicate some type of gastroparesis or reflux disease. Patient has been cleared for psych or behavioral problems. Patient is a poor historian.    Past Medical History  Diagnosis Date  . Diabetes mellitus   . Anxiety   . Hypertension   . Hyperlipemia   . Mental disorder   . Schizoaffective disorder   . Impulsive control disorder  . Pancreatitis   . Otitis media   . Cancer   . HOH (hard of hearing)   . Tourette's disease   . Mental retardation     No past surgical history on file.  No family history on file.  History  Substance Use Topics  . Smoking status: Never Smoker   . Smokeless tobacco: Not on file  . Alcohol Use: No      Review of Systems  Gastrointestinal: Positive for abdominal pain. Negative for  diarrhea and constipation.  All other systems reviewed and are negative.    Allergies  Review of patient's allergies indicates no known allergies.  Home Medications   Current Outpatient Rx  Name  Route  Sig  Dispense  Refill  . amLODipine (NORVASC) 5 MG tablet   Oral   Take 5 mg by mouth every morning.         Marland Kitchen aspirin 81 MG chewable tablet   Oral   Chew 81 mg by mouth every morning.         . dicyclomine (BENTYL) 20 MG tablet   Oral   Take 1 tablet (20 mg total) by mouth 2 (two) times daily.   20 tablet   0   . HYDROcodone-acetaminophen (NORCO/VICODIN) 5-325 MG per tablet   Oral   Take 1 tablet by mouth every 4 (four) hours as needed. For sleep         . loratadine (CLARITIN) 10 MG tablet   Oral   Take 10 mg by mouth every morning.          . lubiprostone (AMITIZA) 24 MCG capsule   Oral   Take 24 mcg by mouth 2 (two) times daily with a meal.         . metFORMIN (GLUCOPHAGE-XR) 500 MG 24 hr tablet   Oral   Take 500 mg by mouth daily with breakfast.         .  metoCLOPramide (REGLAN) 10 MG tablet   Oral   Take 10 mg by mouth 4 (four) times daily as needed. For nausea         . omeprazole (PRILOSEC) 20 MG capsule   Oral   Take 20 mg by mouth daily.         . polyethylene glycol (MIRALAX / GLYCOLAX) packet   Oral   Take 17 g by mouth every morning.         . pravastatin (PRAVACHOL) 20 MG tablet   Oral   Take 20 mg by mouth at bedtime.          . vitamin B-12 (CYANOCOBALAMIN) 1000 MCG tablet   Oral   Take 1,000 mcg by mouth every morning.          . zolpidem (AMBIEN) 5 MG tablet   Oral   Take 5 mg by mouth at bedtime as needed. For sleep           Triage Vitals: BP 170/96  Pulse 102  Temp(Src) 98.7 F (37.1 C) (Oral)  Resp 22  SpO2 96%  Physical Exam  Constitutional: He is oriented to person, place, and time. He appears well-developed and well-nourished.  HENT:  Head: Normocephalic and atraumatic.  Eyes: Conjunctivae  and EOM are normal. Pupils are equal, round, and reactive to light.  Neck: Normal range of motion. Neck supple.  Cardiovascular: Normal rate, regular rhythm and normal heart sounds.   Abdominal: Soft. He exhibits distension. There is generalized tenderness. There is no rebound and no guarding.  Neurological: He is alert and oriented to person, place, and time.  Hx of mild MR.  Skin: Skin is warm and dry. No rash noted.    ED Course  Procedures (including critical care time) DIAGNOSTIC STUDIES: Oxygen Saturation is 96% on room air, adequate by my interpretation.    COORDINATION OF CARE: 5:46 PM- Patient here complaining of abdominal pain. Will order x-ray, CBC, CMP, lipase, UA, drug screen and alcohol screen. Patient informed of current plan for treatment and evaluation and agrees with plan at this time.   9:41 PM- ALT is elevated so have ordered US, results of which are still pending. Patient still complaining of pain so ordered additional morphine 4 mg and Zofran 4 mg.   11:08 PM - Pain is improved. No vomiting in the ED. Abdomen soft and nontender. Discussed follow up with gastroenterology for elevated liver enzymes with his caretaker. Will discharge with instructions on further management of gastroparesis.    Labs Reviewed  CBC WITH DIFFERENTIAL - Abnormal; Notable for the following:    Neutrophils Relative 80 (*)    Lymphocytes Relative 8 (*)    All other components within normal limits  COMPREHENSIVE METABOLIC PANEL - Abnormal; Notable for the following:    Glucose, Bld 125 (*)    Creatinine, Ser 1.46 (*)    AST 220 (*)    ALT 197 (*)    Alkaline Phosphatase 153 (*)    GFR calc non Af Amer 54 (*)    GFR calc Af Amer 62 (*)    All other components within normal limits  URINALYSIS, ROUTINE W REFLEX MICROSCOPIC - Abnormal; Notable for the following:    Ketones, ur TRACE (*)    All other components within normal limits  URINE RAPID DRUG SCREEN (HOSP PERFORMED) - Abnormal;  Notable for the following:    Opiates POSITIVE (*)    All other components within normal limits  LIPASE, BLOOD  ETHANOL     US Abdomen Complete  03/08/2012  *RADIOLOGY REPORT*  Clinical Data:  Abdominal pain.  ABDOMINAL ULTRASOUND COMPLETE  Comparison:  CT of the abdomen and pelvis performed 02/12/2012  Findings:  Gallbladder:  The gallbladder is normal in appearance, without evidence for gallstones, gallbladder wall thickening or pericholecystic fluid.  No ultrasonographic Murphy's sign is elicited, though evaluation is limited due to patient motion and diffuse abdominal pain.  Common Bile Duct:  Not visualized due to overlying bowel gas and patient motion.  Liver:  Normal parenchymal echogenicity and echotexture; no focal lesions identified.  Limited Doppler evaluation demonstrates normal blood flow within the liver.  IVC:  Unremarkable in appearance.  Pancreas:  Not well characterized due to patient motion and overlying bowel gas.  Spleen:  9.3 cm in length; within normal limits in size and echotexture though difficult to fully characterize.  Right kidney:  10.4 cm in length; normal in size, configuration and parenchymal echogenicity.  No evidence of mass or hydronephrosis.  Left kidney:  10.0 cm in length; normal in size, configuration and parenchymal echogenicity.  No evidence of mass or hydronephrosis.  Abdominal Aorta:  Normal in caliber; no aneurysm identified.  Not visualized distally due to overlying bowel gas.  IMPRESSION: Unremarkable abdominal ultrasound.  Evaluation limited due to patient motion and overlying bowel gas.   Original Report Authenticated By: Tonia Ghent, M.D.    Dg Abd Acute W/chest  03/08/2012  *RADIOLOGY REPORT*  Clinical Data: Abdominal pain and distention.  ACUTE ABDOMEN SERIES (ABDOMEN 2 VIEW & CHEST 1 VIEW)  Comparison: 02/12/2012  Findings: Low lung volumes noted with mild airspace opacity along the left hemidiaphragm.  No free intraperitoneal gas noted beneath the  hemidiaphragms.  Mild gastric distention noted.  Air-fluid levels are present in the descending colon, where they can be normal.  Gas is present throughout the large bowel.  No dilated small bowel observed.  No centralization of bowel loops to favor ascites.  IMPRESSION:  1.  Borderline distended stomach. 2.  Gas throughout the colon, without a significant amount of formed stool, although this may be incidental. 3.  No dilated bowel to favor obstruction. 4.  Indistinct airspace opacity left lung base, potentially reflecting atelectasis or pneumonia.   Original Report Authenticated By: Gaylyn Rong, M.D.      1. Gastroparesis   2. Elevated liver enzymes       MDM  I personally performed the services described in this documentation, which was scribed in my presence. The recorded information has been reviewed and is accurate.    [name], MD 03/08/12 2351

## 2012-03-08 NOTE — ED Notes (Signed)
Pt's care giver reported to EMS that [facility] pt might have eaten too much and ate again when he arrived home.  Pt reports generalized abd pain since this am.  Pt has hx of pancreatitis.

## 2012-03-08 NOTE — ED Notes (Signed)
[id number]<AV> Expected date:03/08/12<BR> Expected time: 5:22 PM<BR> Means of arrival:Ambulance<BR> Comments:<BR> ems

## 2012-03-08 NOTE — ED Notes (Signed)
MD at bedside. [place] 

## 2012-03-08 NOTE — ED Notes (Signed)
[facility] contacted 

## 2012-03-08 NOTE — ED Notes (Signed)
David Stall, RN [phone].

## 2012-03-08 NOTE — ED Notes (Signed)
Ultrasound informed me they were in the building walking over to pt's room.

## 2012-03-09 ENCOUNTER — Emergency Department (HOSPITAL_COMMUNITY): Payer: Medicare Other

## 2012-03-09 ENCOUNTER — Inpatient Hospital Stay (HOSPITAL_COMMUNITY)
Admission: EM | Admit: 2012-03-09 | Discharge: 2012-03-11 | DRG: 392 | Disposition: A | Discharge status: Skilled Nursing Facility | Payer: Medicare Other | Attending: Internal Medicine | Admitting: Internal Medicine

## 2012-03-09 ENCOUNTER — Emergency Department (HOSPITAL_COMMUNITY)
Admission: EM | Admit: 2012-03-09 | Discharge: 2012-03-09 | Disposition: A | Discharge status: Home / Self Care | Payer: Medicare Other | Source: Home / Self Care | Attending: Emergency Medicine | Admitting: Emergency Medicine

## 2012-03-09 ENCOUNTER — Encounter (HOSPITAL_COMMUNITY): Payer: Self-pay | Admitting: Emergency Medicine

## 2012-03-09 ENCOUNTER — Encounter (HOSPITAL_COMMUNITY): Payer: Self-pay

## 2012-03-09 DIAGNOSIS — F259 Schizoaffective disorder, unspecified: Secondary | ICD-10-CM | POA: Diagnosis present

## 2012-03-09 DIAGNOSIS — I129 Hypertensive chronic kidney disease with stage 1 through stage 4 chronic kidney disease, or unspecified chronic kidney disease: Secondary | ICD-10-CM | POA: Diagnosis present

## 2012-03-09 DIAGNOSIS — I10 Essential (primary) hypertension: Secondary | ICD-10-CM | POA: Diagnosis present

## 2012-03-09 DIAGNOSIS — F952 Tourette's disorder: Secondary | ICD-10-CM | POA: Insufficient documentation

## 2012-03-09 DIAGNOSIS — Z7982 Long term (current) use of aspirin: Secondary | ICD-10-CM

## 2012-03-09 DIAGNOSIS — R7401 Elevation of levels of liver transaminase levels: Secondary | ICD-10-CM | POA: Diagnosis present

## 2012-03-09 DIAGNOSIS — Z79899 Other long term (current) drug therapy: Secondary | ICD-10-CM

## 2012-03-09 DIAGNOSIS — K5900 Constipation, unspecified: Secondary | ICD-10-CM | POA: Diagnosis present

## 2012-03-09 DIAGNOSIS — Z8669 Personal history of other diseases of the nervous system and sense organs: Secondary | ICD-10-CM | POA: Insufficient documentation

## 2012-03-09 DIAGNOSIS — E119 Type 2 diabetes mellitus without complications: Secondary | ICD-10-CM | POA: Diagnosis present

## 2012-03-09 DIAGNOSIS — Z8719 Personal history of other diseases of the digestive system: Secondary | ICD-10-CM | POA: Insufficient documentation

## 2012-03-09 DIAGNOSIS — F411 Generalized anxiety disorder: Secondary | ICD-10-CM | POA: Diagnosis present

## 2012-03-09 DIAGNOSIS — E876 Hypokalemia: Secondary | ICD-10-CM | POA: Diagnosis present

## 2012-03-09 DIAGNOSIS — I498 Other specified cardiac arrhythmias: Secondary | ICD-10-CM | POA: Diagnosis present

## 2012-03-09 DIAGNOSIS — Z859 Personal history of malignant neoplasm, unspecified: Secondary | ICD-10-CM | POA: Insufficient documentation

## 2012-03-09 DIAGNOSIS — K3184 Gastroparesis: Secondary | ICD-10-CM | POA: Insufficient documentation

## 2012-03-09 DIAGNOSIS — K219 Gastro-esophageal reflux disease without esophagitis: Secondary | ICD-10-CM

## 2012-03-09 DIAGNOSIS — Z8659 Personal history of other mental and behavioral disorders: Secondary | ICD-10-CM | POA: Insufficient documentation

## 2012-03-09 DIAGNOSIS — R7989 Other specified abnormal findings of blood chemistry: Secondary | ICD-10-CM | POA: Insufficient documentation

## 2012-03-09 DIAGNOSIS — N183 Chronic kidney disease, stage 3 unspecified: Secondary | ICD-10-CM | POA: Diagnosis present

## 2012-03-09 DIAGNOSIS — K5909 Other constipation: Secondary | ICD-10-CM | POA: Diagnosis present

## 2012-03-09 DIAGNOSIS — A088 Other specified intestinal infections: Principal | ICD-10-CM | POA: Diagnosis present

## 2012-03-09 DIAGNOSIS — N2889 Other specified disorders of kidney and ureter: Secondary | ICD-10-CM | POA: Diagnosis present

## 2012-03-09 DIAGNOSIS — E785 Hyperlipidemia, unspecified: Secondary | ICD-10-CM | POA: Insufficient documentation

## 2012-03-09 DIAGNOSIS — R7402 Elevation of levels of lactic acid dehydrogenase (LDH): Secondary | ICD-10-CM | POA: Diagnosis present

## 2012-03-09 DIAGNOSIS — F79 Unspecified intellectual disabilities: Secondary | ICD-10-CM | POA: Diagnosis present

## 2012-03-09 DIAGNOSIS — R109 Unspecified abdominal pain: Secondary | ICD-10-CM | POA: Diagnosis present

## 2012-03-09 LAB — CBC WITH DIFFERENTIAL/PLATELET
Basophils Absolute: 0 10*3/uL (ref 0.0–0.1)
Basophils Relative: 0 % (ref 0–1)
Hemoglobin: 14.4 g/dL (ref 13.0–17.0)
MCHC: 32.9 g/dL (ref 30.0–36.0)
Neutro Abs: 7.9 10*3/uL — ABNORMAL HIGH (ref 1.7–7.7)
Neutrophils Relative %: 85 % — ABNORMAL HIGH (ref 43–77)
RDW: 14 % (ref 11.5–15.5)
WBC: 9.4 10*3/uL (ref 4.0–10.5)

## 2012-03-09 LAB — COMPREHENSIVE METABOLIC PANEL
ALT: 178 U/L — ABNORMAL HIGH (ref 0–53)
AST: 118 U/L — ABNORMAL HIGH (ref 0–37)
Albumin: 3.9 g/dL (ref 3.5–5.2)
Alkaline Phosphatase: 146 U/L — ABNORMAL HIGH (ref 39–117)
Chloride: 107 mEq/L (ref 96–112)
Potassium: 4 mEq/L (ref 3.5–5.1)
Sodium: 142 mEq/L (ref 135–145)
Total Bilirubin: 0.4 mg/dL (ref 0.3–1.2)

## 2012-03-09 LAB — CBC
HCT: 42.4 % (ref 39.0–52.0)
Hemoglobin: 14 g/dL (ref 13.0–17.0)
MCH: 28.1 pg (ref 26.0–34.0)
MCHC: 33 g/dL (ref 30.0–36.0)

## 2012-03-09 LAB — GLUCOSE, CAPILLARY
Glucose-Capillary: 107 mg/dL — ABNORMAL HIGH (ref 70–99)
Glucose-Capillary: 111 mg/dL — ABNORMAL HIGH (ref 70–99)
Glucose-Capillary: 111 mg/dL — ABNORMAL HIGH (ref 70–99)

## 2012-03-09 LAB — CREATININE, SERUM: Creatinine, Ser: 1.34 mg/dL (ref 0.50–1.35)

## 2012-03-09 MED ORDER — ENOXAPARIN SODIUM 40 MG/0.4ML ~~LOC~~ SOLN
40.0000 mg | SUBCUTANEOUS | Status: DC
  Administered 2012-03-09 – 2012-03-11 (×3): 40 mg via SUBCUTANEOUS
  Filled 2012-03-09 (×3): qty 0.4

## 2012-03-09 MED ORDER — POLYETHYLENE GLYCOL 3350 17 G PO PACK
17.0000 g | PACK | Freq: Every morning | ORAL | Status: DC
  Administered 2012-03-10 – 2012-03-11 (×2): 17 g via ORAL
  Filled 2012-03-09 (×2): qty 1

## 2012-03-09 MED ORDER — ONDANSETRON 4 MG PO TBDP
4.0000 mg | ORAL_TABLET | Freq: Three times a day (TID) | ORAL | Status: DC | PRN
  Filled 2012-03-09: qty 1

## 2012-03-09 MED ORDER — MORPHINE SULFATE 4 MG/ML IJ SOLN
4.0000 mg | Freq: Once | INTRAMUSCULAR | Status: AC
  Administered 2012-03-09: 4 mg via INTRAVENOUS
  Filled 2012-03-09: qty 1

## 2012-03-09 MED ORDER — ZOLPIDEM TARTRATE 5 MG PO TABS: 5.0000 mg | ORAL_TABLET | Freq: Every evening | ORAL | Status: DC | PRN

## 2012-03-09 MED ORDER — PANTOPRAZOLE SODIUM 40 MG IV SOLR
40.0000 mg | Freq: Once | INTRAVENOUS | Status: AC
  Administered 2012-03-09: 40 mg via INTRAVENOUS
  Filled 2012-03-09: qty 40

## 2012-03-09 MED ORDER — SODIUM CHLORIDE 0.9 % IV BOLUS (SEPSIS)
1000.0000 mL | Freq: Once | INTRAVENOUS | Status: AC
  Administered 2012-03-09: 1000 mL via INTRAVENOUS

## 2012-03-09 MED ORDER — SIMVASTATIN 10 MG PO TABS
10.0000 mg | ORAL_TABLET | Freq: Every day | ORAL | Status: DC
  Administered 2012-03-09 – 2012-03-11 (×3): 10 mg via ORAL
  Filled 2012-03-09 (×3): qty 1

## 2012-03-09 MED ORDER — SODIUM CHLORIDE 0.9 % IV SOLN
INTRAVENOUS | Status: DC
  Administered 2012-03-09: 10:00:00 via INTRAVENOUS

## 2012-03-09 MED ORDER — MORPHINE SULFATE 2 MG/ML IJ SOLN
1.0000 mg | INTRAMUSCULAR | Status: DC | PRN
  Filled 2012-03-09: qty 1

## 2012-03-09 MED ORDER — VITAMIN D3 25 MCG (1000 UNIT) PO TABS
1000.0000 [IU] | ORAL_TABLET | Freq: Every day | ORAL | Status: DC
  Administered 2012-03-09 – 2012-03-11 (×3): 1000 [IU] via ORAL
  Filled 2012-03-09 (×3): qty 1

## 2012-03-09 MED ORDER — ONDANSETRON HCL 4 MG/2ML IJ SOLN
4.0000 mg | INTRAMUSCULAR | Status: AC
  Administered 2012-03-09: 4 mg via INTRAVENOUS
  Filled 2012-03-09: qty 2

## 2012-03-09 MED ORDER — SUCRALFATE 1 GM/10ML PO SUSP
1.0000 g | Freq: Three times a day (TID) | ORAL | Status: DC
  Administered 2012-03-09 – 2012-03-11 (×8): 1 g via ORAL
  Filled 2012-03-09 (×11): qty 10

## 2012-03-09 MED ORDER — LORAZEPAM 2 MG/ML IJ SOLN
1.0000 mg | Freq: Once | INTRAMUSCULAR | Status: AC
  Administered 2012-03-09: 1 mg via INTRAVENOUS
  Filled 2012-03-09: qty 1

## 2012-03-09 MED ORDER — INSULIN ASPART 100 UNIT/ML ~~LOC~~ SOLN: 0.0000 [IU] | Freq: Four times a day (QID) | SUBCUTANEOUS | Status: DC

## 2012-03-09 MED ORDER — SENNOSIDES-DOCUSATE SODIUM 8.6-50 MG PO TABS
1.0000 | ORAL_TABLET | Freq: Every evening | ORAL | Status: DC | PRN
  Filled 2012-03-09: qty 1

## 2012-03-09 MED ORDER — METOCLOPRAMIDE HCL 10 MG PO TABS: 5.0000 mg | ORAL_TABLET | Freq: Three times a day (TID) | ORAL | Status: DC | PRN

## 2012-03-09 MED ORDER — OXYCODONE HCL 5 MG PO TABS: 5.0000 mg | ORAL_TABLET | ORAL | Status: DC | PRN

## 2012-03-09 MED ORDER — QUETIAPINE FUMARATE ER 400 MG PO TB24
400.0000 mg | ORAL_TABLET | Freq: Every day | ORAL | Status: DC
  Administered 2012-03-09 – 2012-03-10 (×2): 400 mg via ORAL
  Filled 2012-03-09 (×4): qty 1

## 2012-03-09 MED ORDER — VITAMIN B-12 1000 MCG PO TABS
1000.0000 ug | ORAL_TABLET | Freq: Every morning | ORAL | Status: DC
  Administered 2012-03-10 – 2012-03-11 (×2): 1000 ug via ORAL
  Filled 2012-03-09 (×2): qty 1

## 2012-03-09 MED ORDER — ONDANSETRON HCL 4 MG/2ML IJ SOLN
4.0000 mg | Freq: Once | INTRAMUSCULAR | Status: AC
  Administered 2012-03-09: 4 mg via INTRAVENOUS
  Filled 2012-03-09: qty 2

## 2012-03-09 MED ORDER — TESTOSTERONE CYPIONATE 200 MG/ML IM SOLN: 200.0000 mg | INTRAMUSCULAR | Status: DC

## 2012-03-09 MED ORDER — IOHEXOL 300 MG/ML  SOLN
100.0000 mL | Freq: Once | INTRAMUSCULAR | Status: AC | PRN
  Administered 2012-03-09: 100 mL via INTRAVENOUS

## 2012-03-09 MED ORDER — IOHEXOL 300 MG/ML  SOLN
50.0000 mL | Freq: Once | INTRAMUSCULAR | Status: AC | PRN
  Administered 2012-03-09: 50 mL via ORAL

## 2012-03-09 MED ORDER — ASPIRIN 81 MG PO CHEW
81.0000 mg | CHEWABLE_TABLET | Freq: Every morning | ORAL | Status: DC
  Administered 2012-03-10 – 2012-03-11 (×2): 81 mg via ORAL
  Filled 2012-03-09 (×2): qty 1

## 2012-03-09 MED ORDER — ALUM & MAG HYDROXIDE-SIMETH 200-200-20 MG/5ML PO SUSP: 10.0000 mL | Freq: Four times a day (QID) | ORAL | Status: DC | PRN

## 2012-03-09 MED ORDER — DICYCLOMINE HCL 20 MG PO TABS
20.0000 mg | ORAL_TABLET | Freq: Four times a day (QID) | ORAL | Status: DC
  Administered 2012-03-09 – 2012-03-11 (×8): 20 mg via ORAL
  Filled 2012-03-09 (×11): qty 1

## 2012-03-09 MED ORDER — SODIUM CHLORIDE 0.9 % IV SOLN
8.0000 mg/h | INTRAVENOUS | Status: DC
  Administered 2012-03-09: 8 mg/h via INTRAVENOUS
  Filled 2012-03-09 (×3): qty 80

## 2012-03-09 MED ORDER — GI COCKTAIL ~~LOC~~
30.0000 mL | Freq: Once | ORAL | Status: AC
  Administered 2012-03-09: 30 mL via ORAL
  Filled 2012-03-09: qty 30

## 2012-03-09 MED ORDER — SODIUM CHLORIDE 0.9 % IJ SOLN
3.0000 mL | Freq: Two times a day (BID) | INTRAMUSCULAR | Status: DC
  Administered 2012-03-09 – 2012-03-11 (×3): 3 mL via INTRAVENOUS

## 2012-03-09 MED ORDER — ALUM & MAG HYDROXIDE-SIMETH 400-400-40 MG/5ML PO SUSP: 10.0000 mL | Freq: Four times a day (QID) | ORAL | Status: DC | PRN

## 2012-03-09 NOTE — ED Notes (Signed)
Patient transported to CT 

## 2012-03-09 NOTE — ED Provider Notes (Signed)
History     CSN: [id number]  Arrival date & time 03/09/12  0102   First MD Initiated Contact with Patient 03/09/12 0112      Chief Complaint  Patient presents with  . Abdominal Pain    (Consider location/radiation/quality/duration/timing/severity/associated sxs/prior treatment) HPI Comments: Patient is a 52 y/o who presents for abdominal pain. Patient has hx of MR and lives at a group home. Was seen today by Dr. Ranae Palms and discharged with a dx of gastroparesis and elevated LFTs. Patient brought to ED by EMS from group home as he continued to complain of abdominal pain. Patient poor historian secondary to mental retardation.  Patient is a 52 y.o. male presenting with abdominal pain. The history is provided by the patient. History limited by: mental retardation. No language interpreter was used.  Abdominal Pain Pain location:  Generalized Pain radiates to:  Does not radiate Pain severity:  Moderate Duration: unable to specify. Timing: unable to specify. Relieved by:  Nothing Exacerbated by: unable to specify. Ineffective treatments: unable to specify. Associated symptoms: no diarrhea, no fever, no nausea and no vomiting     Past Medical History  Diagnosis Date  . Diabetes mellitus   . Anxiety   . Hypertension   . Hyperlipemia   . Mental disorder   . Schizoaffective disorder   . Impulsive control disorder  . Pancreatitis   . Otitis media   . Cancer   . HOH (hard of hearing)   . Tourette's disease   . Mental retardation     History reviewed. No pertinent past surgical history.  History reviewed. No pertinent family history.  History  Substance Use Topics  . Smoking status: Never Smoker   . Smokeless tobacco: Not on file  . Alcohol Use: No    Review of Systems  Constitutional: Negative for fever.  Gastrointestinal: Positive for abdominal pain. Negative for nausea, vomiting and diarrhea.  All other systems reviewed and are negative.    Allergies  Review  of patient's allergies indicates no known allergies.  Home Medications   Current Outpatient Rx  Name  Route  Sig  Dispense  Refill  . aspirin 81 MG chewable tablet   Oral   Chew 81 mg by mouth every morning.         . cholecalciferol (VITAMIN D) 1000 UNITS tablet   Oral   Take 1,000 Units by mouth daily.         Marland Kitchen dicyclomine (BENTYL) 20 MG tablet   Oral   Take 20 mg by mouth 4 (four) times daily.         Marland Kitchen HYDROcodone-acetaminophen (NORCO/VICODIN) 5-325 MG per tablet   Oral   Take 1-2 tablets by mouth every 4 (four) hours as needed for pain. For sleep         . metFORMIN (GLUCOPHAGE-XR) 500 MG 24 hr tablet   Oral   Take 500 mg by mouth daily with breakfast.         . metoCLOPramide (REGLAN) 5 MG tablet   Oral   Take 5 mg by mouth every 8 (eight) hours as needed (nausea and/or vomiting.).         Marland Kitchen omeprazole (PRILOSEC) 20 MG capsule   Oral   Take 40 mg by mouth daily.          . ondansetron (ZOFRAN ODT) 4 MG disintegrating tablet   Oral   Take 1 tablet (4 mg total) by mouth every 8 (eight) hours as needed for nausea.  20 tablet   0   . polyethylene glycol (MIRALAX / GLYCOLAX) packet   Oral   Take 17 g by mouth every morning.         . pravastatin (PRAVACHOL) 20 MG tablet   Oral   Take 20 mg by mouth at bedtime.          Marland Kitchen QUEtiapine (SEROQUEL XR) 400 MG 24 hr tablet   Oral   Take 400 mg by mouth at bedtime.         . senna-docusate (SENOKOT-S) 8.6-50 MG per tablet   Oral   Take 1 tablet by mouth at bedtime as needed for constipation.         Marland Kitchen testosterone cypionate (DEPO-TESTOSTERONE) 200 MG/ML injection   Intramuscular   Inject 200 mg into the muscle every 28 (twenty-eight) days.         . vitamin B-12 (CYANOCOBALAMIN) 1000 MCG tablet   Oral   Take 1,000 mcg by mouth every morning.          . zolpidem (AMBIEN) 5 MG tablet   Oral   Take 5 mg by mouth at bedtime as needed. For sleep           BP 131/90  Pulse 85   Temp(Src) 97.8 F (36.6 C) (Oral)  Resp 18  SpO2 96%  Physical Exam  Constitutional: He is oriented to person, place, and time. He appears well-developed. No distress.  HENT:  Head: Normocephalic and atraumatic.  Eyes: No scleral icterus.  Cardiovascular: Normal rate, regular rhythm, normal heart sounds and intact distal pulses.   Pulmonary/Chest: Effort normal and breath sounds normal. No respiratory distress. He has no wheezes.  Abdominal: He exhibits distension. He exhibits no mass. Bowel sounds are decreased. There is tenderness. There is no guarding.  Musculoskeletal: Normal range of motion.  Neurological: He is alert and oriented to person, place, and time.  Skin: No rash noted. He is not diaphoretic. No erythema.  Psychiatric: He has a normal mood and affect.    ED Course  Procedures (including critical care time)  Labs Reviewed  GLUCOSE, CAPILLARY - Abnormal; Notable for the following:    Glucose-Capillary 107 (*)    All other components within normal limits   US Abdomen Complete  03/08/2012  *RADIOLOGY REPORT*  Clinical Data:  Abdominal pain.  ABDOMINAL ULTRASOUND COMPLETE  Comparison:  CT of the abdomen and pelvis performed 02/12/2012  Findings:  Gallbladder:  The gallbladder is normal in appearance, without evidence for gallstones, gallbladder wall thickening or pericholecystic fluid.  No ultrasonographic Murphy's sign is elicited, though evaluation is limited due to patient motion and diffuse abdominal pain.  Common Bile Duct:  Not visualized due to overlying bowel gas and patient motion.  Liver:  Normal parenchymal echogenicity and echotexture; no focal lesions identified.  Limited Doppler evaluation demonstrates normal blood flow within the liver.  IVC:  Unremarkable in appearance.  Pancreas:  Not well characterized due to patient motion and overlying bowel gas.  Spleen:  9.3 cm in length; within normal limits in size and echotexture though difficult to fully characterize.   Right kidney:  10.4 cm in length; normal in size, configuration and parenchymal echogenicity.  No evidence of mass or hydronephrosis.  Left kidney:  10.0 cm in length; normal in size, configuration and parenchymal echogenicity.  No evidence of mass or hydronephrosis.  Abdominal Aorta:  Normal in caliber; no aneurysm identified.  Not visualized distally due to overlying bowel gas.  IMPRESSION: Unremarkable  abdominal ultrasound.  Evaluation limited due to patient motion and overlying bowel gas.   Original Report Authenticated By: Tonia Ghent, M.D.    Dg Abd Acute W/chest  03/08/2012  *RADIOLOGY REPORT*  Clinical Data: Abdominal pain and distention.  ACUTE ABDOMEN SERIES (ABDOMEN 2 VIEW & CHEST 1 VIEW)  Comparison: 02/12/2012  Findings: Low lung volumes noted with mild airspace opacity along the left hemidiaphragm.  No free intraperitoneal gas noted beneath the hemidiaphragms.  Mild gastric distention noted.  Air-fluid levels are present in the descending colon, where they can be normal.  Gas is present throughout the large bowel.  No dilated small bowel observed.  No centralization of bowel loops to favor ascites.  IMPRESSION:  1.  Borderline distended stomach. 2.  Gas throughout the colon, without a significant amount of formed stool, although this may be incidental. 3.  No dilated bowel to favor obstruction. 4.  Indistinct airspace opacity left lung base, potentially reflecting atelectasis or pneumonia.   Original Report Authenticated By: Gaylyn Rong, M.D.     1. Gastroparesis   2. Elevated liver function tests      MDM  Patient with hx of MR returns to ED for abdominal pain after being seen this afternoon for the same complaint and being discharged with a dx of gastroparesis and elevated LFTs. Patient was worked up with afternoon with a Xray of the abdomen, abdominal US, CBC, CMP, lipase, UA, urine drug screen, and ethanol level. Patient is poor historian secondary to MR, but offers no  additional complaints. Will manage with IVF, IV morphine and IV zofran. Will reevaluate at later time to see if pain is more controlled.  Per nurse, patient's pain was better controlled after first dose of morphine. Patient was sleeping comfortably and awoke "crying in pain" again. Another 4mg  IV morphine ordered for pain management. Additional fluid bolus ordered as well.  Patient no longer agitated or crying due to pain. Resting comfortably, nontoxic and VSS. Patient stable for transport back to group home. Have given specific diet instructions with discharge regarding management for the patient's gastroparesis. Patient should follow up with his PCP in 24-48 hours. Return to ED if symptoms worsen.   Filed Vitals:   03/09/12 0106 03/09/12 0124 03/09/12 0349  BP: 130/104 146/79 131/90  Pulse: 97 88 85  Temp: 98 F (36.7 C) 98 F (36.7 C) 97.8 F (36.6 C)  TempSrc: Oral Oral Oral  Resp: 20  18  SpO2: 95% 95% 96%         Antony Madura, PA-C 03/09/12 [phone]

## 2012-03-09 NOTE — ED Notes (Signed)
Mrs. Mayme [facility] staff (group home), was called and notified of pt's discharge instructions--- tel# [phone].

## 2012-03-09 NOTE — ED Provider Notes (Signed)
History     CSN: [id number]  Arrival date & time 03/09/12  0901   First MD Initiated Contact with Patient 03/09/12 [phone]      Chief Complaint  Patient presents with  . Abdominal Pain    (Consider location/radiation/quality/duration/timing/severity/associated sxs/prior treatment) Patient is a 52 y.o. male presenting with abdominal pain. The history is provided by the patient and the EMS personnel. The history is limited by the condition of the patient.  Abdominal Pain  patient here with diffuse epigastric pain that began after patient had food. Patient seen here twice in the past 24 hours for similar symptoms and has been diagnosed with gastroparesis. No reported fever, diarrhea, vomiting. Pain is severe and nothing makes it better. Patient sent in for further evaluation. He has a history of MR which makes history difficult to obtain  Past Medical History  Diagnosis Date  . Diabetes mellitus   . Anxiety   . Hypertension   . Hyperlipemia   . Mental disorder   . Schizoaffective disorder   . Impulsive control disorder  . Pancreatitis   . Otitis media   . Cancer   . HOH (hard of hearing)   . Tourette's disease   . Mental retardation     History reviewed. No pertinent past surgical history.  No family history on file.  History  Substance Use Topics  . Smoking status: Never Smoker   . Smokeless tobacco: Not on file  . Alcohol Use: No      Review of Systems  Gastrointestinal: Positive for abdominal pain.  All other systems reviewed and are negative.    Allergies  Review of patient's allergies indicates no known allergies.  Home Medications   Current Outpatient Rx  Name  Route  Sig  Dispense  Refill  . alum & mag hydroxide-simeth (MAALOX PLUS) 400-400-40 MG/5ML suspension   Oral   Take 10 mLs by mouth every 6 (six) hours as needed for indigestion.   355 mL   0   . aspirin 81 MG chewable tablet   Oral   Chew 81 mg by mouth every morning.         .  cholecalciferol (VITAMIN D) 1000 UNITS tablet   Oral   Take 1,000 Units by mouth daily.         Marland Kitchen dicyclomine (BENTYL) 20 MG tablet   Oral   Take 20 mg by mouth 4 (four) times daily.         Marland Kitchen HYDROcodone-acetaminophen (NORCO/VICODIN) 5-325 MG per tablet   Oral   Take 1-2 tablets by mouth every 4 (four) hours as needed for pain. For sleep         . metFORMIN (GLUCOPHAGE-XR) 500 MG 24 hr tablet   Oral   Take 500 mg by mouth daily with breakfast.         . metoCLOPramide (REGLAN) 5 MG tablet   Oral   Take 5 mg by mouth every 8 (eight) hours as needed (nausea and/or vomiting.).         Marland Kitchen omeprazole (PRILOSEC) 20 MG capsule   Oral   Take 40 mg by mouth daily.          . ondansetron (ZOFRAN ODT) 4 MG disintegrating tablet   Oral   Take 1 tablet (4 mg total) by mouth every 8 (eight) hours as needed for nausea.   20 tablet   0   . polyethylene glycol (MIRALAX / GLYCOLAX) packet   Oral   Take 17  g by mouth every morning.         . pravastatin (PRAVACHOL) 20 MG tablet   Oral   Take 20 mg by mouth at bedtime.          Marland Kitchen QUEtiapine (SEROQUEL XR) 400 MG 24 hr tablet   Oral   Take 400 mg by mouth at bedtime.         . senna-docusate (SENOKOT-S) 8.6-50 MG per tablet   Oral   Take 1 tablet by mouth at bedtime as needed for constipation.         Marland Kitchen testosterone cypionate (DEPO-TESTOSTERONE) 200 MG/ML injection   Intramuscular   Inject 200 mg into the muscle every 28 (twenty-eight) days.         . vitamin B-12 (CYANOCOBALAMIN) 1000 MCG tablet   Oral   Take 1,000 mcg by mouth every morning.          . zolpidem (AMBIEN) 5 MG tablet   Oral   Take 5 mg by mouth at bedtime as needed. For sleep           BP 126/70  Pulse 109  Temp(Src) 98 F (36.7 C) (Oral)  SpO2 96%  Physical Exam  Nursing note and vitals reviewed. Constitutional: He appears well-developed and well-nourished.  Non-toxic appearance. No distress.  HENT:  Head: Normocephalic and  atraumatic.  Eyes: Conjunctivae, EOM and lids are normal. Pupils are equal, round, and reactive to light.  Neck: Normal range of motion. Neck supple. No tracheal deviation present. No mass present.  Cardiovascular: Normal rate, regular rhythm and normal heart sounds.  Exam reveals no gallop.   No murmur heard. Pulmonary/Chest: Effort normal and breath sounds normal. No stridor. No respiratory distress. He has no decreased breath sounds. He has no wheezes. He has no rhonchi. He has no rales.  Abdominal: Soft. Normal appearance and bowel sounds are normal. He exhibits no distension. There is generalized tenderness. There is no rigidity, no rebound, no guarding and no CVA tenderness.  Musculoskeletal: Normal range of motion. He exhibits no edema and no tenderness.  Neurological: He is alert. He has normal strength. No cranial nerve deficit or sensory deficit. GCS eye subscore is 4. GCS verbal subscore is 5. GCS motor subscore is 6.  Skin: Skin is warm and dry. No abrasion and no rash noted.  Psychiatric: His speech is normal. His mood appears anxious. He is agitated.    ED Course  Procedures (including critical care time)  Labs Reviewed  CBC WITH DIFFERENTIAL  COMPREHENSIVE METABOLIC PANEL  LIPASE, BLOOD   US Abdomen Complete  03/08/2012  *RADIOLOGY REPORT*  Clinical Data:  Abdominal pain.  ABDOMINAL ULTRASOUND COMPLETE  Comparison:  CT of the abdomen and pelvis performed 02/12/2012  Findings:  Gallbladder:  The gallbladder is normal in appearance, without evidence for gallstones, gallbladder wall thickening or pericholecystic fluid.  No ultrasonographic Murphy's sign is elicited, though evaluation is limited due to patient motion and diffuse abdominal pain.  Common Bile Duct:  Not visualized due to overlying bowel gas and patient motion.  Liver:  Normal parenchymal echogenicity and echotexture; no focal lesions identified.  Limited Doppler evaluation demonstrates normal blood flow within the  liver.  IVC:  Unremarkable in appearance.  Pancreas:  Not well characterized due to patient motion and overlying bowel gas.  Spleen:  9.3 cm in length; within normal limits in size and echotexture though difficult to fully characterize.  Right kidney:  10.4 cm in length; normal in size, configuration  and parenchymal echogenicity.  No evidence of mass or hydronephrosis.  Left kidney:  10.0 cm in length; normal in size, configuration and parenchymal echogenicity.  No evidence of mass or hydronephrosis.  Abdominal Aorta:  Normal in caliber; no aneurysm identified.  Not visualized distally due to overlying bowel gas.  IMPRESSION: Unremarkable abdominal ultrasound.  Evaluation limited due to patient motion and overlying bowel gas.   Original Report Authenticated By: Tonia Ghent, M.D.    Dg Abd Acute W/chest  03/08/2012  *RADIOLOGY REPORT*  Clinical Data: Abdominal pain and distention.  ACUTE ABDOMEN SERIES (ABDOMEN 2 VIEW & CHEST 1 VIEW)  Comparison: 02/12/2012  Findings: Low lung volumes noted with mild airspace opacity along the left hemidiaphragm.  No free intraperitoneal gas noted beneath the hemidiaphragms.  Mild gastric distention noted.  Air-fluid levels are present in the descending colon, where they can be normal.  Gas is present throughout the large bowel.  No dilated small bowel observed.  No centralization of bowel loops to favor ascites.  IMPRESSION:  1.  Borderline distended stomach. 2.  Gas throughout the colon, without a significant amount of formed stool, although this may be incidental. 3.  No dilated bowel to favor obstruction. 4.  Indistinct airspace opacity left lung base, potentially reflecting atelectasis or pneumonia.   Original Report Authenticated By: Gaylyn Rong, M.D.      No diagnosis found.    MDM  Patient given medication for abdominal pain and vomiting. Suspect that he likely has peptic ulcer disease. This being his third visit in 24 hours. Patient will be  admitted         Toy Baker, MD 03/09/12 1304

## 2012-03-09 NOTE — ED Notes (Signed)
Brought in by EMS from group home with c/o generalized abdominal without c/o nausea or vomiting--- staff at the group home called EMS.  Pt arrived to room ED alert and tearful guarding his abdomen.  Pt was seen earlier today and subsequently discharged home with dx GASTROPARESIS and ELEVATED LIVER ENZYMES.

## 2012-03-09 NOTE — H&P (Addendum)
[facility] History and Physical  Shawn Baird ZOX:[id number] DOB: 1961/01/05 DOA: 03/09/2012  Referring physician:  Dr. Freida Busman PCP:  [name], MD   Chief Complaint:  Abdominal pain  HPI:  The patient is a 52 y.o. year-old M with history of T2DM, HTN, HLD, Schizoaffective d/o, tourette's, mental retardation, pancreatitis who has presented to the ER over the last 24 hours 3 times with complaints of abdominal pain that is made worse by eating.  Patient is a difficult historian, frequently answering "yes" to questions that are not yes/no questions.  He is inconsistent in his answers, however, he does describe that his pain is worse with eating and located in the lower part of his abdomen.  He endorses nausea with vomiting, diarrhea, and constipation.  His sister was notified by the group home yesterday evening around 5pm because he was crying and complaining of abdominal pain.  He had associated nausea and small amt of vomiting, nonbilious/nonbloody.  He has had three episodes of similar abdominal pain and nausea and vomiting in the last few months which were diagnosed as viral gastroenteritis vs. Gastroparesis.  He was brought back and forth to the ER 3 times, this morning, he was also complaining of chest pain without shortness of breath.  [place] abd and CT abd/pelvis were unremarkable.  LFTs have been mildly elevated.  He is being admitted for pain control and further evaluation of his abdominal pain.    Review of Systems:  Unable to obtain as patient answers "yes" to all questions even if the questions contradict one another.  Per family member and caregiver, no recent fevers or other signs of colds or urinary tract infections.    Past Medical History  Diagnosis Date  . Diabetes mellitus   . Anxiety   . Hypertension   . Hyperlipemia   . Mental disorder   . Schizoaffective disorder   . Impulsive control disorder  . Pancreatitis   . Otitis media   . Cancer   . HOH (hard of  hearing)   . Tourette's disease   . Mental retardation    Past Surgical History  Procedure Laterality Date  . 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 He has never used smokeless tobacco. He reports that he does not drink alcohol or use illicit drugs. [facility]  No Known Allergies  Family History  Problem Relation Age of Onset  . High blood pressure Sister   . High Cholesterol Sister   . Heart disease Mother   . Stroke Mother     Prior to Admission medications   Medication Sig Start Date End Date Taking? Authorizing Provider  alum & mag hydroxide-simeth (MAALOX PLUS) 400-400-40 MG/5ML suspension Take 10 mLs by mouth every 6 (six) hours as needed for indigestion. 03/09/12  Yes [name], MD  amLODipine (NORVASC) 5 MG tablet Take 5 mg by mouth daily.   Yes Historical Provider, MD  aspirin 81 MG chewable tablet Chew 81 mg by mouth every morning.   Yes Historical Provider, MD  cholecalciferol (VITAMIN D) 1000 UNITS tablet Take 1,000 Units by mouth daily.   Yes Historical Provider, MD  dicyclomine (BENTYL) 20 MG tablet Take 20 mg by mouth 4 (four) times daily. 02/14/12  Yes [name], PA  HYDROcodone-acetaminophen (NORCO/VICODIN) 5-325 MG per tablet Take 1-2 tablets by mouth every 4 (four) hours as needed for pain.  For sleep   Yes Historical Provider, MD  loratadine (CLARITIN) 10 MG tablet Take 10 mg by mouth daily.   Yes Historical Provider, MD  metFORMIN (GLUCOPHAGE-XR) 500 MG 24 hr tablet Take 500 mg by mouth daily with breakfast.   Yes Historical Provider, MD  metoCLOPramide (REGLAN) 5 MG tablet Take 5 mg by mouth every 8 (eight) hours as needed (nausea and/or vomiting.).   Yes Historical Provider, MD  omeprazole (PRILOSEC) 20 MG capsule Take 20 mg by mouth daily.    Yes Historical Provider, MD  ondansetron (ZOFRAN ODT) 4 MG disintegrating tablet Take 1 tablet (4 mg total) by mouth every 8  (eight) hours as needed for nausea. 03/08/12  Yes [name], MD  polyethylene glycol [facility] / GLYCOLAX) packet Take 17 g by mouth every morning.   Yes Historical Provider, MD  pravastatin (PRAVACHOL) 20 MG tablet Take 20 mg by mouth at bedtime.    Yes Historical Provider, MD  QUEtiapine (SEROQUEL XR) 400 MG 24 hr tablet Take 400 mg by mouth at bedtime.   Yes Historical Provider, MD  senna-docusate (SENOKOT-S) 8.6-50 MG per tablet Take 1 tablet by mouth at bedtime as needed for constipation.   Yes Historical Provider, MD  testosterone cypionate (DEPO-TESTOSTERONE) 200 MG/ML injection Inject 200 mg into the muscle every 28 (twenty-eight) days.   Yes Historical Provider, MD  vitamin B-12 (CYANOCOBALAMIN) 1000 MCG tablet Take 1,000 mcg by mouth every morning.    Yes Historical Provider, MD  zolpidem (AMBIEN) 5 MG tablet Take 5 mg by mouth at bedtime as needed. For sleep   Yes Historical Provider, MD   Physical Exam: Filed Vitals:   03/09/12 0908  BP: 126/70  Pulse: 109  Temp: 98 F (36.7 C)  TempSrc: Oral  SpO2: 96%     General:  AAM, no acute distress  Eyes:  PERRL, anicteric, non-injected.  ENT:  OP clear, non-erythematous without plaques or exudates.  MMM.  Neck:  Supple without TM or JVD.  Lymph:  No cervical, supraclavicular, or submandibular LAD.  Cardiovascular:  RRR without m/r/g.  2+ pulses  Respiratory:  CTA bilaterally without increased WOB.  Abdomen:  NABS.  Soft, ND.  Reports tenderness without rebound or guarding.    Skin:  No rashes or focal lesions.  Musculoskeletal:  Normal bulk and tone.  No LE edema.  Psychiatric:  A & O x 4.  Appropriate affect.  Neurologic:  No facial asymmetry, strength 5/5 throughout, sensation intact to moderate touch.  Labs on Admission:  Basic Metabolic Panel:  Recent Labs Lab 03/08/12 1830 03/09/12 0940  NA 139 142  K 3.6 4.0  CL 104 107  CO2 22 23  GLUCOSE 125* 105*  BUN 10 11  CREATININE 1.46* 1.51*  CALCIUM  9.0 8.5   Liver Function Tests:  Recent Labs Lab 03/08/12 1830 03/09/12 0940  AST 220* 118*  ALT 197* 178*  ALKPHOS 153* 146*  BILITOT 0.4 0.4  PROT 7.8 7.8  ALBUMIN 3.7 3.9    Recent Labs Lab 03/08/12 1830 03/09/12 0940  LIPASE 43 18   No results found for this basename: AMMONIA,  in the last 168 hours CBC:  Recent Labs Lab 03/08/12 1830 03/09/12 0940  WBC 9.0 9.4  NEUTROABS 7.2 7.9*  HGB 15.4 14.4  HCT 44.9 43.8  MCV 84.9 85.5  PLT 302 312   Cardiac Enzymes: No results found for this basename: CKTOTAL, CKMB, CKMBINDEX, TROPONINI,  in the last 168 hours  BNP (last 3 results)  No results found for this basename: PROBNP,  in the last 8760 hours CBG:  Recent Labs Lab 03/09/12 0427  GLUCAP 107*    Radiological Exams on Admission: US Abdomen Complete  03/08/2012  *RADIOLOGY REPORT*  Clinical Data:  Abdominal pain.  ABDOMINAL ULTRASOUND COMPLETE  Comparison:  CT of the abdomen and pelvis performed 02/12/2012  Findings:  Gallbladder:  The gallbladder is normal in appearance, without evidence for gallstones, gallbladder wall thickening or pericholecystic fluid.  No ultrasonographic Murphy's sign is elicited, though evaluation is limited due to patient motion and diffuse abdominal pain.  Common Bile Duct:  Not visualized due to overlying bowel gas and patient motion.  Liver:  Normal parenchymal echogenicity and echotexture; no focal lesions identified.  Limited Doppler evaluation demonstrates normal blood flow within the liver.  IVC:  Unremarkable in appearance.  Pancreas:  Not well characterized due to patient motion and overlying bowel gas.  Spleen:  9.3 cm in length; within normal limits in size and echotexture though difficult to fully characterize.  Right kidney:  10.4 cm in length; normal in size, configuration and parenchymal echogenicity.  No evidence of mass or hydronephrosis.  Left kidney:  10.0 cm in length; normal in size, configuration and parenchymal  echogenicity.  No evidence of mass or hydronephrosis.  Abdominal Aorta:  Normal in caliber; no aneurysm identified.  Not visualized distally due to overlying bowel gas.  IMPRESSION: Unremarkable abdominal ultrasound.  Evaluation limited due to patient motion and overlying bowel gas.   Original Report Authenticated By: Tonia Ghent, M.D.    Ct Abdomen Pelvis W Contrast  03/09/2012  *RADIOLOGY REPORT*  Clinical Data: Abdominal pain mainly after eating.  Elevated LFTs. History pancreatitis.  CT ABDOMEN AND PELVIS WITH CONTRAST  Technique:  Multidetector CT imaging of the abdomen and pelvis was performed following the standard protocol during bolus administration of intravenous contrast.  Contrast: 50mL OMNIPAQUE IOHEXOL 300 MG/ML  SOLN, OMNIPAQUE IOHEXOL 300 MG/ML  SOLN  Comparison: 03/08/2012 ultrasound.  02/12/2012 CT.  Findings: Basilar parenchymal changes unchanged suggestive of scarring.  Motion degraded exam.  Taking into account limitation of motion, no worrisome hepatic, splenic, pancreatic, adrenal or renal lesion. Renal low density structures larger on the left unchanged from 2010 exam. There cannot be confirmed as simple cysts.  No extraluminal bowel inflammatory process, free fluid or free air. The appendix is not visualized.  No calcified gallstones.  Decompressed noncontrast filled views of the urinary bladder unremarkable.  No bony destructive lesion.  Mild atherosclerotic type changes of the abdominal aorta.  No aneurysmal dilation.  No bowel containing hernia.  Patulous appearance of the distal esophagus may be related to under distension and / or decompressed hiatal hernia.  IMPRESSION: Motion degraded examination without bowel inflammatory process or visceral abnormality detected as a cause of patient's symptoms. Please see above discussion.   Original Report Authenticated By: Lacy Duverney, M.D.    Dg Abd Acute W/chest  03/08/2012  *RADIOLOGY REPORT*  Clinical Data: Abdominal pain and  distention.  ACUTE ABDOMEN SERIES (ABDOMEN 2 VIEW & CHEST 1 VIEW)  Comparison: 02/12/2012  Findings: Low lung volumes noted with mild airspace opacity along the left hemidiaphragm.  No free intraperitoneal gas noted beneath the hemidiaphragms.  Mild gastric distention noted.  Air-fluid levels are present in the descending colon, where they can be normal.  Gas is present throughout the large bowel.  No dilated small bowel observed.  No centralization of bowel loops to favor ascites.  IMPRESSION:  1.  Borderline distended stomach. 2.  Gas throughout the colon, without a significant amount of formed stool, although this may be incidental. 3.  No dilated bowel to favor obstruction. 4.  Indistinct airspace opacity left lung base, potentially reflecting atelectasis or pneumonia.   Original Report Authenticated By: Gaylyn Rong, M.D.     Assessment/Plan Principal Problem:   Abdominal  pain, other specified site with nausea and vomiting, likely due to viral gastroenteritis versus diabetic gastroparesis Active Problems:   Diabetes   Hypertension   Hyperlipidemia   Schizoaffective disorder   Chronic constipation   GERD (gastroesophageal reflux disease)   Left Renal calcification    Abdominal pain with nausea, vomiting, and transaminitis.  DDx includes viral hepatitis.  No evidence of cholecystitis on [place] or CT.  No gallstones or CBD enlargement demonstrated on either study.  Lipase negative.  UA neg.  Consider anginal equivalent in diabetic male on testosterone and with risk factors of HTN, HLD.   -  Admit to inpatient -  NPO except sips -  Protonix gtt -  Carafate -  Telemetry -  Cycle troponins -  Continue ASA  -  Consider GI consult if not improving.   -  Consider HIDA if not clinically improving -  Continue reglan and bentyl  Transaminitis:  Likely viral.  Bilirubin normal and no evidence of stones -  Hepatitis panel -  Trend in AM -  Avoid tylenol  HTN/HLD:  BP and cholesterol stable.   Defer to PCP.  Cont home meds T2DM:  Stable -  Hold metformin -  SSI low dose q6h while NPO CKD stage III, stable creatinine -  Avoid nephrotoxins -  Renally dose medications prn  Schizoaffective disorder -  Previously did not need sitter - Continue seroquel  GERD:  May be contributing to current sx.  PPI as above Chronic constipation:  Stable while continuing miralax.    Diet:  NPO except ice ships, sips, and meds Access:  PIV IVF:  NS at 1100ml/h Proph:  lovenox  Code Status: full code Family Communication: spoke with sister and caretaker.   Disposition Plan: pending improvement in abdominal pain  Time spent: 60 min  ,  [facility] Pager [phone]  If 7PM-7AM, please contact night-coverage [email or web address] Password [facility] 03/09/2012, 2:35 PM

## 2012-03-09 NOTE — ED Notes (Signed)
[id number]<JY> Expected date:<BR> Expected time:<BR> Means of arrival:<BR> Comments:<BR> Abd Pain

## 2012-03-09 NOTE — ED Notes (Signed)
Per EMS pt from [facility] care group home, pt crying c/o abdominal pain, seen here last pm for same. Pt states "everytime I eat it hurts"

## 2012-03-09 NOTE — ED Provider Notes (Signed)
Medical screening examination/treatment/procedure(s) were performed by non-physician practitioner and as supervising physician I was immediately available for consultation/collaboration.  [name], MD 03/09/12 [phone]

## 2012-03-10 DIAGNOSIS — E876 Hypokalemia: Secondary | ICD-10-CM

## 2012-03-10 DIAGNOSIS — I10 Essential (primary) hypertension: Secondary | ICD-10-CM

## 2012-03-10 LAB — COMPREHENSIVE METABOLIC PANEL
Alkaline Phosphatase: 121 U/L — ABNORMAL HIGH (ref 39–117)
BUN: 10 mg/dL (ref 6–23)
Chloride: 101 mEq/L (ref 96–112)
GFR calc Af Amer: 69 mL/min — ABNORMAL LOW (ref >90)
Glucose, Bld: 101 mg/dL — ABNORMAL HIGH (ref 70–99)
Potassium: 3.2 mEq/L — ABNORMAL LOW (ref 3.5–5.1)
Total Bilirubin: 0.6 mg/dL (ref 0.3–1.2)

## 2012-03-10 LAB — GLUCOSE, CAPILLARY
Glucose-Capillary: 117 mg/dL — ABNORMAL HIGH (ref 70–99)
Glucose-Capillary: 83 mg/dL (ref 70–99)

## 2012-03-10 LAB — CBC
Platelets: 292 10*3/uL (ref 150–400)
RDW: 14.3 % (ref 11.5–15.5)
WBC: 7.2 10*3/uL (ref 4.0–10.5)

## 2012-03-10 LAB — TROPONIN I: Troponin I: <0.3 ng/mL (ref <0.30)

## 2012-03-10 MED ORDER — POTASSIUM CHLORIDE 10 MEQ/100ML IV SOLN
10.0000 meq | INTRAVENOUS | Status: AC
  Administered 2012-03-10 (×4): 10 meq via INTRAVENOUS
  Filled 2012-03-10 (×4): qty 100

## 2012-03-10 MED ORDER — PANTOPRAZOLE SODIUM 40 MG IV SOLR
40.0000 mg | Freq: Every day | INTRAVENOUS | Status: DC
  Administered 2012-03-10: 40 mg via INTRAVENOUS
  Filled 2012-03-10 (×2): qty 40

## 2012-03-10 MED ORDER — METOCLOPRAMIDE HCL 5 MG PO TABS
5.0000 mg | ORAL_TABLET | Freq: Three times a day (TID) | ORAL | Status: DC
  Administered 2012-03-10 – 2012-03-11 (×6): 5 mg via ORAL
  Filled 2012-03-10 (×8): qty 1

## 2012-03-10 MED ORDER — POTASSIUM CHLORIDE IN NACL 20-0.9 MEQ/L-% IV SOLN
INTRAVENOUS | Status: DC
  Administered 2012-03-10 – 2012-03-11 (×2): via INTRAVENOUS
  Filled 2012-03-10 (×3): qty 1000

## 2012-03-10 NOTE — Progress Notes (Signed)
Utilization review completed.  

## 2012-03-10 NOTE — Progress Notes (Signed)
Clinical Social Work Department BRIEF PSYCHOSOCIAL ASSESSMENT 03/10/2012  Patient:  Shawn Baird,Shawn Baird     Account Number:  [id number]     Admit date:  03/09/2012  Clinical Social Worker:  Doroteo Glassman  Date/Time:  03/10/2012 11:12 AM  Referred by:  Physician  Date Referred:  03/10/2012 Referred for  ALF Placement   Other Referral:   Interview type:  Other - See comment Other interview type:   [facility]  Pt's HCPOA, Shawn Baird, sister    PSYCHOSOCIAL DATA Living Status:  FACILITY Admitted from facility:   Level of care:   Primary support name:  Shawn Baird Primary support relationship to patient:  SIBLING Degree of support available:   adequate    CURRENT CONCERNS Current Concerns  Post-Acute Placement   Other Concerns:    SOCIAL WORK ASSESSMENT / PLAN Reviewed chart.  Noted that Pt MR and not likely to be able to make decisions for himself.    Contacted [facility].  Informed that Pt can return when ready.  Asked to send an FL2 with Pt upon d/c. Facility doesn't need it at the outset.    Spoke with Pt's sister, Shawn Baird, HCPOA.  Pt's sister stated that she'd like for Pt to return to [facility] upon d/c.    CSW thanked Mrs. Montez Morita for her time.   Assessment/plan status:  Psychosocial Support/Ongoing Assessment of Needs Other assessment/ plan:   Information/referral to community resources:   n/a--from facility    PATIENT'S/FAMILY'S RESPONSE TO PLAN OF CARE: [facility] and Pt's sister thanked CSW for time and assistance.   CSW to continue to follow.  Shawn Baird, LCSWA Clinical Social Work [phone]

## 2012-03-10 NOTE — Progress Notes (Signed)
[facility] PROGRESS NOTE  Shawn Baird ZOX:[id number] DOB: [birth year] DOA: 03/09/2012  PCP: [name], MD  Brief HPI: The patient is a 52 y.o. year-old M with history of T2DM, HTN, HLD, Schizoaffective d/o, tourette's, mental retardation, pancreatitis who presented to the ER 3 times in 24 hrs with complaints of abdominal pain that is made worse by eating. Patient is a difficult historian, frequently answering "yes" to questions that are not yes/no questions. He is inconsistent in his answers, however, he did describe that his pain is worse with eating and located in the lower part of his abdomen. He endorsed nausea with vomiting, diarrhea, and constipation. His sister was notified by the group home the evening prior to admission around 5pm because he was crying and complaining of abdominal pain. He had associated nausea and small amt of vomiting, nonbilious/nonbloody. He had three episodes of similar abdominal pain and nausea and vomiting in the last few months which were diagnosed as viral gastroenteritis vs. Gastroparesis. He was also complaining of chest pain without shortness of breath. [place] abd and CT abd/pelvis were unremarkable. LFTs have been mildly elevated. He is being admitted for pain control and further evaluation of his abdominal pain.   Past medical history:  Past Medical History  Diagnosis Date  . Diabetes mellitus   . Anxiety   . Hypertension   . Hyperlipemia   . Mental disorder   . Schizoaffective disorder   . Impulsive control disorder  . Pancreatitis   . Otitis media   . Cancer   . HOH (hard of hearing)   . Tourette's disease   . Mental retardation     Consultants: none  Procedures: None  Antibiotics: None  Subjective: Patient denies any pain this morning. Otherwise unable to obtain much information from him due to his psychiatric problems.  Objective: Vital Signs  Filed Vitals:   03/09/12 1611 03/09/12 1656 03/09/12 2207 03/10/12 0608  BP:  147/90 154/97 130/83 123/73  Pulse: 98 93 94 89  Temp: 98.5 F (36.9 C) 98.4 F (36.9 C) 98.9 F (37.2 C) 98.5 F (36.9 C)  TempSrc: Oral Oral Oral Oral  Resp: 18 18 16 16   Weight:  75 kg (165 lb 5.5 oz)    SpO2: 95% 92% 95% 94%    Intake/Output Summary (Last 24 hours) at 03/10/12 0817 Last data filed at 03/10/12 0400  Gross per 24 hour  Intake 1451.67 ml  Output      0 ml  Net 1451.67 ml   Filed Weights   03/09/12 1656  Weight: 75 kg (165 lb 5.5 oz)    General appearance: alert, appears stated age and no distress Head: Normocephalic, without obvious abnormality, atraumatic Resp: clear to auscultation bilaterally Cardio: regular rate and rhythm, S1, S2 normal, no murmur, click, rub or gallop GI: soft, non-tender; bowel sounds normal; no masses,  no organomegaly Extremities: extremities normal, atraumatic, no cyanosis or edema Skin: Skin color, texture, turgor normal. No rashes or lesions Neurologic: Alert. No focal deficits.  Lab Results:  Basic Metabolic Panel:  Recent Labs Lab 03/08/12 1830 03/09/12 0940 03/09/12 1733 03/10/12 0505  NA 139 142  --  137  K 3.6 4.0  --  3.2*  CL 104 107  --  101  CO2 22 23  --  21  GLUCOSE 125* 105*  --  101*  BUN 10 11  --  10  CREATININE 1.46* 1.51* 1.34 1.34  CALCIUM 9.0 8.5  --  8.6   Liver Function Tests:  Recent Labs Lab 03/08/12 1830 03/09/12 0940 03/10/12 0505  AST 220* 118* 58*  ALT 197* 178* 119*  ALKPHOS 153* 146* 121*  BILITOT 0.4 0.4 0.6  PROT 7.8 7.8 6.9  ALBUMIN 3.7 3.9 3.5    Recent Labs Lab 03/08/12 1830 03/09/12 0940  LIPASE 43 18   CBC:  Recent Labs Lab 03/08/12 1830 03/09/12 0940 03/09/12 1733 03/10/12 0505  WBC 9.0 9.4 7.9 7.2  NEUTROABS 7.2 7.9*  --   --   HGB 15.4 14.4 14.0 14.2  HCT 44.9 43.8 42.4 42.9  MCV 84.9 85.5 85.0 85.1  PLT 302 312 288 292   Cardiac Enzymes:  Recent Labs Lab 03/09/12 1733 03/09/12 2340 03/10/12 0505  TROPONINI <0.30 <0.30 <0.30    CBG:  Recent Labs Lab 03/09/12 0427 03/09/12 1808 03/09/12 2343 03/10/12 0559  GLUCAP 107* 111* 111* 83    No results found for this or any previous visit (from the past 240 hour(s)).    Studies/Results: US Abdomen Complete  03/08/2012  *RADIOLOGY REPORT*  Clinical Data:  Abdominal pain.  ABDOMINAL ULTRASOUND COMPLETE  Comparison:  CT of the abdomen and pelvis performed 02/12/2012  Findings:  Gallbladder:  The gallbladder is normal in appearance, without evidence for gallstones, gallbladder wall thickening or pericholecystic fluid.  No ultrasonographic Murphy's sign is elicited, though evaluation is limited due to patient motion and diffuse abdominal pain.  Common Bile Duct:  Not visualized due to overlying bowel gas and patient motion.  Liver:  Normal parenchymal echogenicity and echotexture; no focal lesions identified.  Limited Doppler evaluation demonstrates normal blood flow within the liver.  IVC:  Unremarkable in appearance.  Pancreas:  Not well characterized due to patient motion and overlying bowel gas.  Spleen:  9.3 cm in length; within normal limits in size and echotexture though difficult to fully characterize.  Right kidney:  10.4 cm in length; normal in size, configuration and parenchymal echogenicity.  No evidence of mass or hydronephrosis.  Left kidney:  10.0 cm in length; normal in size, configuration and parenchymal echogenicity.  No evidence of mass or hydronephrosis.  Abdominal Aorta:  Normal in caliber; no aneurysm identified.  Not visualized distally due to overlying bowel gas.  IMPRESSION: Unremarkable abdominal ultrasound.  Evaluation limited due to patient motion and overlying bowel gas.   Original Report Authenticated By: Tonia Ghent, M.D.    Ct Abdomen Pelvis W Contrast  03/09/2012  *RADIOLOGY REPORT*  Clinical Data: Abdominal pain mainly after eating.  Elevated LFTs. History pancreatitis.  CT ABDOMEN AND PELVIS WITH CONTRAST  Technique:  Multidetector CT imaging  of the abdomen and pelvis was performed following the standard protocol during bolus administration of intravenous contrast.  Contrast: 50mL OMNIPAQUE IOHEXOL 300 MG/ML  SOLN, OMNIPAQUE IOHEXOL 300 MG/ML  SOLN  Comparison: 03/08/2012 ultrasound.  02/12/2012 CT.  Findings: Basilar parenchymal changes unchanged suggestive of scarring.  Motion degraded exam.  Taking into account limitation of motion, no worrisome hepatic, splenic, pancreatic, adrenal or renal lesion. Renal low density structures larger on the left unchanged from 2010 exam. There cannot be confirmed as simple cysts.  No extraluminal bowel inflammatory process, free fluid or free air. The appendix is not visualized.  No calcified gallstones.  Decompressed noncontrast filled views of the urinary bladder unremarkable.  No bony destructive lesion.  Mild atherosclerotic type changes of the abdominal aorta.  No aneurysmal dilation.  No bowel containing hernia.  Patulous appearance of the distal esophagus may be related to under distension and /  or decompressed hiatal hernia.  IMPRESSION: Motion degraded examination without bowel inflammatory process or visceral abnormality detected as a cause of patient's symptoms. Please see above discussion.   Original Report Authenticated By: Lacy Duverney, M.D.    Dg Abd Acute W/chest  03/08/2012  *RADIOLOGY REPORT*  Clinical Data: Abdominal pain and distention.  ACUTE ABDOMEN SERIES (ABDOMEN 2 VIEW & CHEST 1 VIEW)  Comparison: 02/12/2012  Findings: Low lung volumes noted with mild airspace opacity along the left hemidiaphragm.  No free intraperitoneal gas noted beneath the hemidiaphragms.  Mild gastric distention noted.  Air-fluid levels are present in the descending colon, where they can be normal.  Gas is present throughout the large bowel.  No dilated small bowel observed.  No centralization of bowel loops to favor ascites.  IMPRESSION:  1.  Borderline distended stomach. 2.  Gas throughout the colon, without a  significant amount of formed stool, although this may be incidental. 3.  No dilated bowel to favor obstruction. 4.  Indistinct airspace opacity left lung base, potentially reflecting atelectasis or pneumonia.   Original Report Authenticated By: Gaylyn Rong, M.D.     Medications:  Scheduled: . aspirin  81 mg Oral q morning - 10a  . cholecalciferol  1,000 Units Oral Daily  . dicyclomine  20 mg Oral QID  . enoxaparin (LOVENOX) injection  40 mg Subcutaneous Q24H  . insulin aspart  0-9 Units Subcutaneous Q6H  . polyethylene glycol  17 g Oral q morning - 10a  . QUEtiapine  400 mg Oral QHS  . simvastatin  10 mg Oral q1800  . sodium chloride  3 mL Intravenous Q12H  . sucralfate  1 g Oral TID WC & HS  . [START ON 03/25/2012] testosterone cypionate  200 mg Intramuscular Q28 days  . vitamin B-12  1,000 mcg Oral q morning - 10a   Continuous: . sodium chloride Stopped (03/09/12 1258)  . pantoprozole (PROTONIX) infusion 8 mg/hr (03/09/12 1956)   GNF:AOZH & mag hydroxide-simeth, metoCLOPramide, morphine injection, ondansetron, oxyCODONE, senna-docusate, zolpidem  Assessment/Plan:  Principal Problem:   Abdominal  pain, other specified site with nausea and vomiting, likely due to viral gastroenteritis versus diabetic gastroparesis Active Problems:   Diabetes   Hypertension   Hyperlipidemia   Schizoaffective disorder   Chronic constipation   GERD (gastroesophageal reflux disease)   Left Renal calcification    Abdominal pain with nausea, vomiting DDx includes viral hepatitis/gastroparesis. No evidence of cholecystitis on [place] or CT. No gallstones or CBD enlargement demonstrated on either study. Lipase negative. UA neg. CT reviewed with radiologist today and no stenosis noted in vasculature either. Patient's examination benign this morning. Stop PPI infusion and start dose daily. Start clear liquid diet. Start Reglan scheduled. If he has recurrence of symptoms despite above measures he will  need further GI work up.   Transaminitis Likely viral. Bilirubin normal and no evidence of stones. LFT improving as well. No clear indication for HIDA as no RUQ pain or tenderness. Avoid tylenol   Hypokalemia Likely from vomiting. Replete IV.  HTN/HLD BP and cholesterol stable. Defer to PCP. Cont home meds   T2DM Stable. Holding metformin. SSI.   CKD stage III Stable creatinine. Avoid nephrotoxins   Schizoaffective disorder  Seems stable. Continue seroquel   GERD May be contributing to current sx. PPI as above   Chronic constipation Stable while continuing miralax.   DVT Prophylaxis: Lovenox Code Status: full code  Family Communication: No family at bedside. Disposition Plan: Advance diet. Watch for recurrence  of symptoms.    LOS: 1 day   [facility]  [facility] Pager [phone] 03/10/2012, 8:17 AM  If 8PM-8AM, please contact night-coverage at [email or web address], password [facility]

## 2012-03-11 DIAGNOSIS — F259 Schizoaffective disorder, unspecified: Secondary | ICD-10-CM

## 2012-03-11 LAB — CBC
MCH: 28 pg (ref 26.0–34.0)
MCV: 84.7 fL (ref 78.0–100.0)
Platelets: 259 10*3/uL (ref 150–400)
RDW: 14.2 % (ref 11.5–15.5)
WBC: 5.1 10*3/uL (ref 4.0–10.5)

## 2012-03-11 LAB — COMPREHENSIVE METABOLIC PANEL
ALT: 78 U/L — ABNORMAL HIGH (ref 0–53)
AST: 32 U/L (ref 0–37)
Albumin: 3.3 g/dL — ABNORMAL LOW (ref 3.5–5.2)
Calcium: 8.3 mg/dL — ABNORMAL LOW (ref 8.4–10.5)
Chloride: 104 mEq/L (ref 96–112)
Creatinine, Ser: 1.36 mg/dL — ABNORMAL HIGH (ref 0.50–1.35)
Sodium: 138 mEq/L (ref 135–145)

## 2012-03-11 LAB — HEPATITIS B SURFACE ANTIBODY,QUALITATIVE: Hep B S Ab: NONREACTIVE

## 2012-03-11 LAB — GLUCOSE, CAPILLARY: Glucose-Capillary: 88 mg/dL (ref 70–99)

## 2012-03-11 LAB — HEPATITIS B SURFACE ANTIGEN: Hepatitis B Surface Ag: NEGATIVE

## 2012-03-11 MED ORDER — PANTOPRAZOLE SODIUM 40 MG PO TBEC
40.0000 mg | DELAYED_RELEASE_TABLET | Freq: Every day | ORAL | Status: DC
  Administered 2012-03-11: 40 mg via ORAL
  Filled 2012-03-11: qty 1

## 2012-03-11 MED ORDER — METOPROLOL TARTRATE 12.5 MG HALF TABLET: 12.5000 mg | ORAL_TABLET | Freq: Two times a day (BID) | ORAL | Status: DC

## 2012-03-11 MED ORDER — OMEPRAZOLE 20 MG PO CPDR: 20.0000 mg | DELAYED_RELEASE_CAPSULE | Freq: Two times a day (BID) | ORAL | Status: DC

## 2012-03-11 MED ORDER — METOCLOPRAMIDE HCL 5 MG PO TABS: 5.0000 mg | ORAL_TABLET | Freq: Three times a day (TID) | ORAL | Status: DC

## 2012-03-11 MED ORDER — SUCRALFATE 1 GM/10ML PO SUSP: 1.0000 g | Freq: Three times a day (TID) | ORAL | Status: DC

## 2012-03-11 MED ORDER — METOPROLOL TARTRATE 12.5 MG HALF TABLET
12.5000 mg | ORAL_TABLET | Freq: Two times a day (BID) | ORAL | Status: DC
  Administered 2012-03-11: 12.5 mg via ORAL
  Filled 2012-03-11 (×2): qty 1

## 2012-03-11 NOTE — Progress Notes (Signed)
Updated FL2.  Will complete FL2 at d/c, as meds will need to be updated.  Providence Crosby, LCSWA Clinical Social Work [phone]

## 2012-03-11 NOTE — Progress Notes (Signed)
Per MD, Pt ready for d/c.  Notified RN and facility.  Message left for Pt's sister.  Sent d/c summary.  Confirmed receipt of d/c summary.  Facility ready to receive Pt.  CSW asked if facility could provide transportation.  Informed that Pt always returns to facility via ambulance.  CSW stated that CSW was happy to arrange for ambulance transport, although there's a chance that a charge will incur.  Facility stated that they were aware and asked CSW to proceed with ambulance transport.  Arranged for transportation.  Providence Crosby, LCSWA Clinical Social Work [phone]

## 2012-03-11 NOTE — Progress Notes (Signed)
This patient is receiving IV Protonix. Based on criteria approved by the Pharmacy and Therapeutics Committee, this medication is being converted to the equivalent oral dose form. These criteria include:   . The patient is eating (either orally or per tube) and/or has been taking other orally administered medications for at least 24 hours.  . This patient has no evidence of active gastrointestinal bleeding or impaired GI absorption (gastrectomy, short bowel, patient on TNA or NPO).   If you have questions about this conversion, please contact the pharmacy department [phone]).  Shawn Baird, [facility] 03/11/2012 9:57 AM

## 2012-03-11 NOTE — Discharge Summary (Signed)
[facility]  Physician Discharge Summary   Patient ID: Shawn Baird MRN: [id number] DOB/AGE: Aug 23, 1960 52 y.o.  Admit date: 03/09/2012 Discharge date: 03/11/2012  PCP: [name], MD  DISCHARGE DIAGNOSES:  Principal Problem:   Abdominal  pain, other specified site with nausea and vomiting, likely due to viral gastroenteritis versus diabetic gastroparesis Active Problems:   Diabetes   Hypertension   Hyperlipidemia   Schizoaffective disorder   Chronic constipation   GERD (gastroesophageal reflux disease)   Left Renal calcification   Hypokalemia   RECOMMENDATIONS FOR OUTPATIENT FOLLOW UP: 1. Patient needs to follow up with PCP for ongoing GI issues. 2. TSH to be checked.  DISCHARGE CONDITION: fair  Diet recommendation: Mod Carb  Filed Weights   03/09/12 1656  Weight: 75 kg (165 lb 5.5 oz)    INITIAL HISTORY: The patient is a 52 y.o. year-old M with history of T2DM, HTN, HLD, Schizoaffective d/o, tourette's, mental retardation, pancreatitis who presented to the ER 3 times in 24 hrs with complaints of abdominal pain that is made worse by eating. Patient is a difficult historian, frequently answering "yes" to questions that are not yes/no questions. He is inconsistent in his answers, however, he did describe that his pain is worse with eating and located in the lower part of his abdomen. He endorsed nausea with vomiting, diarrhea, and constipation. His sister was notified by the group home the evening prior to admission around 5pm because he was crying and complaining of abdominal pain. He had associated nausea and small amt of vomiting, nonbilious/nonbloody. He had three episodes of similar abdominal pain and nausea and vomiting in the last few months which were diagnosed as viral gastroenteritis vs. Gastroparesis. He was also complaining of chest pain without shortness of breath. [place] abd and CT abd/pelvis were unremarkable. LFTs have been mildly elevated. He is being  admitted for pain control and further evaluation of his abdominal pain.    Consultations:  None  Procedures:  None  HOSPITAL COURSE:   Abdominal pain with nausea, vomiting  DDx includes viral gastroenteritis/gastroparesis. No evidence of cholecystitis on [place] or CT. No gallstones or CBD enlargement demonstrated on either study. Lipase negative. UA neg. CT reviewed with radiologist and no stenosis noted in vasculature either. Patient's examination is benign. He was started on a PPI infusion which was changed to oral dose. He will be discharged on twice daily PPI. His diet was advanced and he seems to have tolrated his Lunch. A GES was ordered but could not be done. This can be pursued as OP. Reglan was made scheduled, before meals and at bedtime.   Transaminitis  Likely secondary to viral syndrome. Bilirubin normal and no evidence of stones. LFT improving as well. No clear indication for HIDA as no RUQ pain or tenderness. Avoid tylenol   Hypokalemia  This was repleted.   HTN/HLD  BP and cholesterol stable. Defer to PCP. Cont home meds   T2DM  Continue home medications.   CKD stage III  Stable creatinine. Avoid nephrotoxins   Schizoaffective disorder  Seems stable. Continue seroquel   GERD  May be contributing to current sx. PPI as above   Chronic constipation  Stable while continuing miralax.   Sinus Tachycardia Noted mainly with exertion. Patient is no distress. Denies any Cp or shortness of breath. Will initiate low dose beta blockers. His troponins have been normal. His EKG did not show any acute changes. Should have TSH checked and can be done as OP.  If he tolerates  his lunch and the metoprolol, he should be able to go back to his group home today.  PERTINENT LABS:  The results of significant diagnostics from this hospitalization (including imaging, microbiology, ancillary and laboratory) are listed below for reference.     Labs: Basic Metabolic Panel:  Recent  Labs Lab 03/08/12 1830 03/09/12 0940 03/09/12 1733 03/10/12 0505 03/11/12 0505  NA 139 142  --  137 138  K 3.6 4.0  --  3.2* 3.6  CL 104 107  --  101 104  CO2 22 23  --  21 24  GLUCOSE 125* 105*  --  101* 86  BUN 10 11  --  10 8  CREATININE 1.46* 1.51* 1.34 1.34 1.36*  CALCIUM 9.0 8.5  --  8.6 8.3*   Liver Function Tests:  Recent Labs Lab 03/08/12 1830 03/09/12 0940 03/10/12 0505 03/11/12 0505  AST 220* 118* 58* 32  ALT 197* 178* 119* 78*  ALKPHOS 153* 146* 121* 117  BILITOT 0.4 0.4 0.6 0.5  PROT 7.8 7.8 6.9 7.0  ALBUMIN 3.7 3.9 3.5 3.3*    Recent Labs Lab 03/08/12 1830 03/09/12 0940  LIPASE 43 18   CBC:  Recent Labs Lab 03/08/12 1830 03/09/12 0940 03/09/12 1733 03/10/12 0505 03/11/12 0505  WBC 9.0 9.4 7.9 7.2 5.1  NEUTROABS 7.2 7.9*  --   --   --   HGB 15.4 14.4 14.0 14.2 14.5  HCT 44.9 43.8 42.4 42.9 43.8  MCV 84.9 85.5 85.0 85.1 84.7  PLT 302 312 288 292 259   Cardiac Enzymes:  Recent Labs Lab 03/09/12 1733 03/09/12 2340 03/10/12 0505  TROPONINI <0.30 <0.30 <0.30    CBG:  Recent Labs Lab 03/10/12 0559 03/10/12 1122 03/10/12 1648 03/10/12 2344 03/11/12 0539  GLUCAP 83 88 94 117* 88     IMAGING STUDIES US Abdomen Complete  03/08/2012  *RADIOLOGY REPORT*  Clinical Data:  Abdominal pain.  ABDOMINAL ULTRASOUND COMPLETE  Comparison:  CT of the abdomen and pelvis performed 02/12/2012  Findings:  Gallbladder:  The gallbladder is normal in appearance, without evidence for gallstones, gallbladder wall thickening or pericholecystic fluid.  No ultrasonographic Murphy's sign is elicited, though evaluation is limited due to patient motion and diffuse abdominal pain.  Common Bile Duct:  Not visualized due to overlying bowel gas and patient motion.  Liver:  Normal parenchymal echogenicity and echotexture; no focal lesions identified.  Limited Doppler evaluation demonstrates normal blood flow within the liver.  IVC:  Unremarkable in appearance.   Pancreas:  Not well characterized due to patient motion and overlying bowel gas.  Spleen:  9.3 cm in length; within normal limits in size and echotexture though difficult to fully characterize.  Right kidney:  10.4 cm in length; normal in size, configuration and parenchymal echogenicity.  No evidence of mass or hydronephrosis.  Left kidney:  10.0 cm in length; normal in size, configuration and parenchymal echogenicity.  No evidence of mass or hydronephrosis.  Abdominal Aorta:  Normal in caliber; no aneurysm identified.  Not visualized distally due to overlying bowel gas.  IMPRESSION: Unremarkable abdominal ultrasound.  Evaluation limited due to patient motion and overlying bowel gas.   Original Report Authenticated By: Tonia Ghent, M.D.    Ct Abdomen Pelvis W Contrast  03/09/2012  *RADIOLOGY REPORT*  Clinical Data: Abdominal pain mainly after eating.  Elevated LFTs. History pancreatitis.  CT ABDOMEN AND PELVIS WITH CONTRAST  Technique:  Multidetector CT imaging of the abdomen and pelvis was performed following the standard protocol  during bolus administration of intravenous contrast.  Contrast: 50mL OMNIPAQUE IOHEXOL 300 MG/ML  SOLN, OMNIPAQUE IOHEXOL 300 MG/ML  SOLN  Comparison: 03/08/2012 ultrasound.  02/12/2012 CT.  Findings: Basilar parenchymal changes unchanged suggestive of scarring.  Motion degraded exam.  Taking into account limitation of motion, no worrisome hepatic, splenic, pancreatic, adrenal or renal lesion. Renal low density structures larger on the left unchanged from 2010 exam. There cannot be confirmed as simple cysts.  No extraluminal bowel inflammatory process, free fluid or free air. The appendix is not visualized.  No calcified gallstones.  Decompressed noncontrast filled views of the urinary bladder unremarkable.  No bony destructive lesion.  Mild atherosclerotic type changes of the abdominal aorta.  No aneurysmal dilation.  No bowel containing hernia.  Patulous appearance of the  distal esophagus may be related to under distension and / or decompressed hiatal hernia.  IMPRESSION: Motion degraded examination without bowel inflammatory process or visceral abnormality detected as a cause of patient's symptoms. Please see above discussion.   Original Report Authenticated By: Lacy Duverney, M.D.    Dg Abd Acute W/chest  03/08/2012  *RADIOLOGY REPORT*  Clinical Data: Abdominal pain and distention.  ACUTE ABDOMEN SERIES (ABDOMEN 2 VIEW & CHEST 1 VIEW)  Comparison: 02/12/2012  Findings: Low lung volumes noted with mild airspace opacity along the left hemidiaphragm.  No free intraperitoneal gas noted beneath the hemidiaphragms.  Mild gastric distention noted.  Air-fluid levels are present in the descending colon, where they can be normal.  Gas is present throughout the large bowel.  No dilated small bowel observed.  No centralization of bowel loops to favor ascites.  IMPRESSION:  1.  Borderline distended stomach. 2.  Gas throughout the colon, without a significant amount of formed stool, although this may be incidental. 3.  No dilated bowel to favor obstruction. 4.  Indistinct airspace opacity left lung base, potentially reflecting atelectasis or pneumonia.   Original Report Authenticated By: Gaylyn Rong, M.D.     DISCHARGE EXAMINATION: Filed Vitals:   03/10/12 1610 03/10/12 1330 03/10/12 2110 03/11/12 0620  BP: 123/73 110/75 150/93 130/86  Pulse: 89 102 68 66  Temp: 98.5 F (36.9 C) 98.6 F (37 C) 97.6 F (36.4 C) 97.4 F (36.3 C)  TempSrc: Oral Oral Oral Oral  Resp: 16 18 18 16   Weight:      SpO2: 94% 94% 97% 99%   General appearance: alert, cooperative, appears stated age and no distress. Watching TV. Head: Normocephalic, without obvious abnormality, atraumatic Resp: clear to auscultation bilaterally Cardio: regular rate and rhythm, S1, S2 normal, no murmur, click, rub or gallop GI: soft, non-tender; bowel sounds normal; no masses,  no organomegaly Neurologic: Alert.  No focal deficits.  DISPOSITION: Group Home   Current Discharge Medication List    START taking these medications   Details  metoprolol tartrate (LOPRESSOR) 12.5 mg TABS Take 0.5 tablets (12.5 mg total) by mouth 2 (two) times daily. Qty: 30 tablet, Refills: 0    sucralfate (CARAFATE) 1 GM/10ML suspension Take 10 mLs (1 g total) by mouth 4 (four) times daily -  with meals and at bedtime. For 10 days. Qty: 420 mL, Refills: 0      CONTINUE these medications which have CHANGED   Details  metoCLOPramide (REGLAN) 5 MG tablet Take 1 tablet (5 mg total) by mouth 4 (four) times daily -  before meals and at bedtime. Qty: 120 tablet, Refills: 0    omeprazole (PRILOSEC) 20 MG capsule Take 1 capsule (20 mg  total) by mouth 2 (two) times daily. Qty: 60 capsule, Refills: 0      CONTINUE these medications which have NOT CHANGED   Details  alum & mag hydroxide-simeth (MAALOX PLUS) 400-400-40 MG/5ML suspension Take 10 mLs by mouth every 6 (six) hours as needed for indigestion. Qty: 355 mL, Refills: 0    aspirin 81 MG chewable tablet Chew 81 mg by mouth every morning.    cholecalciferol (VITAMIN D) 1000 UNITS tablet Take 1,000 Units by mouth daily.    dicyclomine (BENTYL) 20 MG tablet Take 20 mg by mouth 4 (four) times daily.    HYDROcodone-acetaminophen (NORCO/VICODIN) 5-325 MG per tablet Take 1-2 tablets by mouth every 4 (four) hours as needed for pain. For sleep    metFORMIN (GLUCOPHAGE-XR) 500 MG 24 hr tablet Take 500 mg by mouth daily with breakfast.    ondansetron (ZOFRAN ODT) 4 MG disintegrating tablet Take 1 tablet (4 mg total) by mouth every 8 (eight) hours as needed for nausea. Qty: 20 tablet, Refills: 0    polyethylene glycol (MIRALAX / GLYCOLAX) packet Take 17 g by mouth every morning.    pravastatin (PRAVACHOL) 20 MG tablet Take 20 mg by mouth at bedtime.     QUEtiapine (SEROQUEL XR) 400 MG 24 hr tablet Take 400 mg by mouth at bedtime.    senna-docusate (SENOKOT-S) 8.6-50 MG  per tablet Take 1 tablet by mouth at bedtime as needed for constipation.    testosterone cypionate (DEPO-TESTOSTERONE) 200 MG/ML injection Inject 200 mg into the muscle every 28 (twenty-eight) days.    vitamin B-12 (CYANOCOBALAMIN) 1000 MCG tablet Take 1,000 mcg by mouth every morning.     zolpidem (AMBIEN) 5 MG tablet Take 5 mg by mouth at bedtime as needed. For sleep      STOP taking these medications     loratadine (CLARITIN) 10 MG tablet        Follow-up Information   Follow up with OSEI-BONSU,GEORGE, MD. Schedule an appointment as soon as possible for a visit in 1 week.   Contact information:   [address], [address] [place] [address] [phone]       TOTAL DISCHARGE TIME: 35 mins  [facility]  [facility] Pager [phone]  03/11/2012, 1:15 PM

## 2012-03-11 NOTE — Progress Notes (Signed)
Discharge instructions given to pt, verbalized understanding. Left the unit in stable condition. 

## 2012-03-14 LAB — GLUCOSE, CAPILLARY: Glucose-Capillary: 109 mg/dL — ABNORMAL HIGH (ref 70–99)

## 2012-03-22 ENCOUNTER — Inpatient Hospital Stay (HOSPITAL_COMMUNITY)
Admission: EM | Admit: 2012-03-22 | Discharge: 2012-03-25 | DRG: 392 | Disposition: A | Discharge status: Nursing Home (Includes ICF) | Payer: Medicare Other | Attending: Internal Medicine | Admitting: Internal Medicine

## 2012-03-22 ENCOUNTER — Emergency Department (HOSPITAL_COMMUNITY)
Admission: EM | Admit: 2012-03-22 | Discharge: 2012-03-22 | Disposition: A | Discharge status: Home / Self Care | Payer: Medicare Other | Source: Home / Self Care | Attending: Emergency Medicine | Admitting: Emergency Medicine

## 2012-03-22 ENCOUNTER — Encounter (HOSPITAL_COMMUNITY): Payer: Self-pay | Admitting: Emergency Medicine

## 2012-03-22 ENCOUNTER — Encounter (HOSPITAL_COMMUNITY): Payer: Self-pay | Admitting: *Deleted

## 2012-03-22 ENCOUNTER — Emergency Department (HOSPITAL_COMMUNITY): Payer: Medicare Other

## 2012-03-22 DIAGNOSIS — Z79899 Other long term (current) drug therapy: Secondary | ICD-10-CM | POA: Insufficient documentation

## 2012-03-22 DIAGNOSIS — K5909 Other constipation: Secondary | ICD-10-CM

## 2012-03-22 DIAGNOSIS — Z8669 Personal history of other diseases of the nervous system and sense organs: Secondary | ICD-10-CM | POA: Insufficient documentation

## 2012-03-22 DIAGNOSIS — E119 Type 2 diabetes mellitus without complications: Secondary | ICD-10-CM | POA: Insufficient documentation

## 2012-03-22 DIAGNOSIS — R109 Unspecified abdominal pain: Secondary | ICD-10-CM

## 2012-03-22 DIAGNOSIS — R7401 Elevation of levels of liver transaminase levels: Secondary | ICD-10-CM

## 2012-03-22 DIAGNOSIS — Z794 Long term (current) use of insulin: Secondary | ICD-10-CM

## 2012-03-22 DIAGNOSIS — F79 Unspecified intellectual disabilities: Secondary | ICD-10-CM | POA: Insufficient documentation

## 2012-03-22 DIAGNOSIS — E785 Hyperlipidemia, unspecified: Secondary | ICD-10-CM | POA: Insufficient documentation

## 2012-03-22 DIAGNOSIS — F259 Schizoaffective disorder, unspecified: Secondary | ICD-10-CM | POA: Insufficient documentation

## 2012-03-22 DIAGNOSIS — I10 Essential (primary) hypertension: Secondary | ICD-10-CM | POA: Insufficient documentation

## 2012-03-22 DIAGNOSIS — Z789 Other specified health status: Secondary | ICD-10-CM | POA: Insufficient documentation

## 2012-03-22 DIAGNOSIS — K219 Gastro-esophageal reflux disease without esophagitis: Secondary | ICD-10-CM

## 2012-03-22 DIAGNOSIS — N2889 Other specified disorders of kidney and ureter: Secondary | ICD-10-CM

## 2012-03-22 DIAGNOSIS — Z8719 Personal history of other diseases of the digestive system: Secondary | ICD-10-CM | POA: Insufficient documentation

## 2012-03-22 DIAGNOSIS — Z859 Personal history of malignant neoplasm, unspecified: Secondary | ICD-10-CM | POA: Insufficient documentation

## 2012-03-22 DIAGNOSIS — Z593 Problems related to living in residential institution: Secondary | ICD-10-CM | POA: Insufficient documentation

## 2012-03-22 DIAGNOSIS — F952 Tourette's disorder: Secondary | ICD-10-CM | POA: Insufficient documentation

## 2012-03-22 DIAGNOSIS — Z8659 Personal history of other mental and behavioral disorders: Secondary | ICD-10-CM | POA: Insufficient documentation

## 2012-03-22 DIAGNOSIS — I129 Hypertensive chronic kidney disease with stage 1 through stage 4 chronic kidney disease, or unspecified chronic kidney disease: Secondary | ICD-10-CM | POA: Diagnosis present

## 2012-03-22 DIAGNOSIS — N183 Chronic kidney disease, stage 3 unspecified: Secondary | ICD-10-CM | POA: Diagnosis present

## 2012-03-22 DIAGNOSIS — R7402 Elevation of levels of lactic acid dehydrogenase (LDH): Secondary | ICD-10-CM | POA: Diagnosis present

## 2012-03-22 DIAGNOSIS — R112 Nausea with vomiting, unspecified: Secondary | ICD-10-CM | POA: Insufficient documentation

## 2012-03-22 DIAGNOSIS — Z823 Family history of stroke: Secondary | ICD-10-CM

## 2012-03-22 DIAGNOSIS — Z8249 Family history of ischemic heart disease and other diseases of the circulatory system: Secondary | ICD-10-CM

## 2012-03-22 DIAGNOSIS — K3184 Gastroparesis: Principal | ICD-10-CM

## 2012-03-22 DIAGNOSIS — E876 Hypokalemia: Secondary | ICD-10-CM

## 2012-03-22 DIAGNOSIS — K209 Esophagitis, unspecified without bleeding: Secondary | ICD-10-CM | POA: Insufficient documentation

## 2012-03-22 DIAGNOSIS — F639 Impulse disorder, unspecified: Secondary | ICD-10-CM | POA: Insufficient documentation

## 2012-03-22 DIAGNOSIS — F411 Generalized anxiety disorder: Secondary | ICD-10-CM | POA: Diagnosis present

## 2012-03-22 LAB — COMPREHENSIVE METABOLIC PANEL
ALT: 35 U/L (ref 0–53)
Alkaline Phosphatase: 118 U/L — ABNORMAL HIGH (ref 39–117)
CO2: 26 mEq/L (ref 19–32)
Chloride: 105 mEq/L (ref 96–112)
GFR calc Af Amer: 64 mL/min — ABNORMAL LOW (ref >90)
GFR calc non Af Amer: 55 mL/min — ABNORMAL LOW (ref >90)
Glucose, Bld: 151 mg/dL — ABNORMAL HIGH (ref 70–99)
Potassium: 4 mEq/L (ref 3.5–5.1)
Sodium: 143 mEq/L (ref 135–145)

## 2012-03-22 LAB — CBC WITH DIFFERENTIAL/PLATELET
Eosinophils Absolute: 0.3 10*3/uL (ref 0.0–0.7)
Hemoglobin: 16.1 g/dL (ref 13.0–17.0)
Lymphocytes Relative: 19 % (ref 12–46)
Lymphocytes Relative: 8 % — ABNORMAL LOW (ref 12–46)
Lymphs Abs: 1.7 10*3/uL (ref 0.7–4.0)
Lymphs Abs: 0.8 10*3/uL (ref 0.7–4.0)
MCH: 29.5 pg (ref 26.0–34.0)
Neutro Abs: 5.8 10*3/uL (ref 1.7–7.7)
Neutrophils Relative %: 67 % (ref 43–77)
Neutrophils Relative %: 84 % — ABNORMAL HIGH (ref 43–77)
Platelets: 290 10*3/uL (ref 150–400)
Platelets: 302 10*3/uL (ref 150–400)
RBC: 5.45 MIL/uL (ref 4.22–5.81)
RBC: 5.35 MIL/uL (ref 4.22–5.81)
WBC: 8.6 10*3/uL (ref 4.0–10.5)
WBC: 9.8 10*3/uL (ref 4.0–10.5)

## 2012-03-22 LAB — MRSA PCR SCREENING: MRSA by PCR: POSITIVE — AB

## 2012-03-22 LAB — BASIC METABOLIC PANEL
Chloride: 103 mEq/L (ref 96–112)
GFR calc Af Amer: 56 mL/min — ABNORMAL LOW (ref >90)
GFR calc non Af Amer: 48 mL/min — ABNORMAL LOW (ref >90)
Glucose, Bld: 180 mg/dL — ABNORMAL HIGH (ref 70–99)
Potassium: 3.7 mEq/L (ref 3.5–5.1)
Sodium: 144 mEq/L (ref 135–145)

## 2012-03-22 LAB — GLUCOSE, CAPILLARY
Glucose-Capillary: 161 mg/dL — ABNORMAL HIGH (ref 70–99)
Glucose-Capillary: 156 mg/dL — ABNORMAL HIGH (ref 70–99)
Glucose-Capillary: 148 mg/dL — ABNORMAL HIGH (ref 70–99)

## 2012-03-22 LAB — CBC
Hemoglobin: 15.3 g/dL (ref 13.0–17.0)
MCH: 29.1 pg (ref 26.0–34.0)
MCHC: 34.5 g/dL (ref 30.0–36.0)
Platelets: 288 10*3/uL (ref 150–400)
RBC: 5.26 MIL/uL (ref 4.22–5.81)

## 2012-03-22 LAB — POCT I-STAT, CHEM 8
BUN: 18 mg/dL (ref 6–23)
Hemoglobin: 17 g/dL (ref 13.0–17.0)
Potassium: 3.6 mEq/L (ref 3.5–5.1)
Sodium: 143 mEq/L (ref 135–145)
TCO2: 31 mmol/L (ref 0–100)

## 2012-03-22 MED ORDER — HYDROMORPHONE HCL PF 1 MG/ML IJ SOLN
1.0000 mg | Freq: Once | INTRAMUSCULAR | Status: AC
  Administered 2012-03-22: 1 mg via INTRAVENOUS
  Filled 2012-03-22: qty 1

## 2012-03-22 MED ORDER — METOPROLOL TARTRATE 12.5 MG HALF TABLET
12.5000 mg | ORAL_TABLET | Freq: Two times a day (BID) | ORAL | Status: DC
  Administered 2012-03-22 – 2012-03-25 (×6): 12.5 mg via ORAL
  Filled 2012-03-22 (×7): qty 1

## 2012-03-22 MED ORDER — MUPIROCIN 2 % EX OINT
1.0000 "application " | TOPICAL_OINTMENT | Freq: Two times a day (BID) | CUTANEOUS | Status: DC
  Administered 2012-03-22 – 2012-03-25 (×6): 1 via NASAL
  Filled 2012-03-22: qty 22

## 2012-03-22 MED ORDER — SODIUM CHLORIDE 0.9 % IV BOLUS (SEPSIS)
1000.0000 mL | Freq: Once | INTRAVENOUS | Status: AC
  Administered 2012-03-22: 1000 mL via INTRAVENOUS

## 2012-03-22 MED ORDER — SUCRALFATE 1 GM/10ML PO SUSP
1.0000 g | Freq: Three times a day (TID) | ORAL | Status: DC
  Administered 2012-03-22 – 2012-03-25 (×9): 1 g via ORAL
  Filled 2012-03-22 (×14): qty 10

## 2012-03-22 MED ORDER — POTASSIUM CHLORIDE IN NACL 20-0.9 MEQ/L-% IV SOLN
INTRAVENOUS | Status: DC
  Administered 2012-03-22 – 2012-03-24 (×5): via INTRAVENOUS
  Filled 2012-03-22 (×6): qty 1000

## 2012-03-22 MED ORDER — ONDANSETRON HCL 4 MG/2ML IJ SOLN
4.0000 mg | Freq: Once | INTRAMUSCULAR | Status: AC
  Administered 2012-03-22: 4 mg via INTRAVENOUS
  Filled 2012-03-22: qty 2

## 2012-03-22 MED ORDER — ENOXAPARIN SODIUM 40 MG/0.4ML ~~LOC~~ SOLN
40.0000 mg | SUBCUTANEOUS | Status: DC
  Administered 2012-03-22 – 2012-03-24 (×3): 40 mg via SUBCUTANEOUS
  Filled 2012-03-22 (×4): qty 0.4

## 2012-03-22 MED ORDER — LORATADINE 10 MG PO TABS
10.0000 mg | ORAL_TABLET | Freq: Every day | ORAL | Status: DC
  Administered 2012-03-22 – 2012-03-25 (×4): 10 mg via ORAL
  Filled 2012-03-22 (×4): qty 1

## 2012-03-22 MED ORDER — LORAZEPAM 2 MG/ML IJ SOLN
1.0000 mg | Freq: Once | INTRAMUSCULAR | Status: AC
  Administered 2012-03-22: 1 mg via INTRAVENOUS
  Filled 2012-03-22: qty 1

## 2012-03-22 MED ORDER — VITAMIN B-12 1000 MCG PO TABS
1000.0000 ug | ORAL_TABLET | Freq: Every morning | ORAL | Status: DC
  Administered 2012-03-23 – 2012-03-25 (×3): 1000 ug via ORAL
  Filled 2012-03-22 (×3): qty 1

## 2012-03-22 MED ORDER — IOHEXOL 300 MG/ML  SOLN
100.0000 mL | Freq: Once | INTRAMUSCULAR | Status: AC | PRN
  Administered 2012-03-22: 100 mL via INTRAVENOUS

## 2012-03-22 MED ORDER — SODIUM CHLORIDE 0.9 % IJ SOLN
3.0000 mL | Freq: Two times a day (BID) | INTRAMUSCULAR | Status: DC
  Administered 2012-03-23: 3 mL via INTRAVENOUS

## 2012-03-22 MED ORDER — IOHEXOL 300 MG/ML  SOLN
50.0000 mL | Freq: Once | INTRAMUSCULAR | Status: AC | PRN
  Administered 2012-03-22: 50 mL via ORAL

## 2012-03-22 MED ORDER — SODIUM CHLORIDE 0.9 % IV SOLN
1000.0000 mL | Freq: Once | INTRAVENOUS | Status: AC
  Administered 2012-03-22: 1000 mL via INTRAVENOUS

## 2012-03-22 MED ORDER — ONDANSETRON HCL 4 MG/2ML IJ SOLN
INTRAMUSCULAR | Status: AC
  Administered 2012-03-22: 4 mg via INTRAVENOUS
  Filled 2012-03-22: qty 2

## 2012-03-22 MED ORDER — SIMVASTATIN 5 MG PO TABS
5.0000 mg | ORAL_TABLET | Freq: Every day | ORAL | Status: DC
  Administered 2012-03-22 – 2012-03-24 (×3): 5 mg via ORAL
  Filled 2012-03-22 (×4): qty 1

## 2012-03-22 MED ORDER — LEVALBUTEROL HCL 0.63 MG/3ML IN NEBU
0.6300 mg | INHALATION_SOLUTION | Freq: Four times a day (QID) | RESPIRATORY_TRACT | Status: DC | PRN
  Filled 2012-03-22: qty 3

## 2012-03-22 MED ORDER — INSULIN ASPART 100 UNIT/ML ~~LOC~~ SOLN
0.0000 [IU] | Freq: Three times a day (TID) | SUBCUTANEOUS | Status: DC
  Administered 2012-03-24 (×2): 3 [IU] via SUBCUTANEOUS

## 2012-03-22 MED ORDER — METOCLOPRAMIDE HCL 5 MG/ML IJ SOLN
10.0000 mg | Freq: Three times a day (TID) | INTRAMUSCULAR | Status: DC
  Administered 2012-03-23: 10 mg via INTRAVENOUS
  Filled 2012-03-22 (×4): qty 2

## 2012-03-22 MED ORDER — CHLORHEXIDINE GLUCONATE CLOTH 2 % EX PADS
6.0000 | MEDICATED_PAD | Freq: Every day | CUTANEOUS | Status: DC
  Administered 2012-03-23 – 2012-03-25 (×3): 6 via TOPICAL

## 2012-03-22 MED ORDER — PANTOPRAZOLE SODIUM 40 MG IV SOLR
40.0000 mg | Freq: Two times a day (BID) | INTRAVENOUS | Status: DC
  Administered 2012-03-22 – 2012-03-23 (×3): 40 mg via INTRAVENOUS
  Filled 2012-03-22 (×5): qty 40

## 2012-03-22 MED ORDER — QUETIAPINE FUMARATE ER 400 MG PO TB24
400.0000 mg | ORAL_TABLET | Freq: Every day | ORAL | Status: DC
  Administered 2012-03-22 – 2012-03-24 (×3): 400 mg via ORAL
  Filled 2012-03-22 (×4): qty 1

## 2012-03-22 MED ORDER — ONDANSETRON HCL 4 MG/2ML IJ SOLN
4.0000 mg | Freq: Once | INTRAMUSCULAR | Status: AC
  Administered 2012-03-22: 4 mg via INTRAVENOUS

## 2012-03-22 MED ORDER — FAMOTIDINE 20 MG PO TABS: 20.0000 mg | ORAL_TABLET | Freq: Two times a day (BID) | ORAL | Status: DC

## 2012-03-22 NOTE — ED Notes (Signed)
Pt asked for urine sample, tried to use urinal but unable to provide sample.

## 2012-03-22 NOTE — ED Provider Notes (Signed)
History     CSN: [id number]  Arrival date & time 03/22/12  1151   First MD Initiated Contact with Patient 03/22/12 1214      Chief Complaint  Patient presents with  . Abdominal Pain    (Consider location/radiation/quality/duration/timing/severity/associated sxs/prior treatment) HPI Comments: This is a 52 year old male, past medical history her markable for hypertension, diabetes type 2, schizoaffective, MR, pancreatitis, and gastroparesis, who presents emergency department with chief complaint of abdominal pain. Patient was seen here earlier this morning, and was discharged with a diagnosis of esophagitis, which was evident on CT abdomen pelvis. Patient returns with worsening abdominal pain, which is not controlled with his outpatient Vicodin or Pepcid. Patient states that his pain is 10 out of 10, and is wailing in the emergency department. He is a poor historian, and oftentimes responds appropriately to questions.  The history is provided by the patient. No language interpreter was used.    Past Medical History  Diagnosis Date  . Diabetes mellitus   . Anxiety   . Hypertension   . Hyperlipemia   . Mental disorder   . Schizoaffective disorder   . Impulsive control disorder  . Pancreatitis   . Otitis media   . Cancer   . HOH (hard of hearing)   . Tourette's disease   . Mental retardation     Past Surgical History  Procedure Laterality Date  . 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 High blood pressure Sister   . High Cholesterol Sister   . Heart disease Mother   . Stroke Mother     History  Substance Use Topics  . Smoking status: Never Smoker   . Smokeless tobacco: Never Used  . Alcohol Use: No      Review of Systems  All other systems reviewed and are negative.    Allergies  Review of patient's allergies indicates no known allergies.  Home Medications   Current Outpatient Rx  Name   Route  Sig  Dispense  Refill  . amLODipine (NORVASC) 5 MG tablet   Oral   Take 5 mg by mouth daily.         . cholecalciferol (VITAMIN D) 1000 UNITS tablet   Oral   Take 1,000 Units by mouth daily.         Marland Kitchen loratadine (CLARITIN) 10 MG tablet   Oral   Take 10 mg by mouth daily.         . metFORMIN (GLUCOPHAGE-XR) 500 MG 24 hr tablet   Oral   Take 500 mg by mouth daily with breakfast.         . metoCLOPramide (REGLAN) 5 MG tablet   Oral   Take 1 tablet (5 mg total) by mouth 4 (four) times daily -  before meals and at bedtime.   120 tablet   0   . metoprolol tartrate (LOPRESSOR) 12.5 mg TABS   Oral   Take 0.5 tablets (12.5 mg total) by mouth 2 (two) times daily.   30 tablet   0   . polyethylene glycol (MIRALAX / GLYCOLAX) packet   Oral   Take 17 g by mouth every morning.         . pravastatin (PRAVACHOL) 20 MG tablet   Oral   Take 20 mg by mouth at bedtime.          Marland Kitchen QUEtiapine (SEROQUEL  XR) 400 MG 24 hr tablet   Oral   Take 400 mg by mouth at bedtime.         . sucralfate (CARAFATE) 1 GM/10ML suspension   Oral   Take 10 mLs (1 g total) by mouth 4 (four) times daily -  with meals and at bedtime. For 10 days.   420 mL   0   . vitamin B-12 (CYANOCOBALAMIN) 1000 MCG tablet   Oral   Take 1,000 mcg by mouth every morning.          . famotidine (PEPCID) 20 MG tablet   Oral   Take 1 tablet (20 mg total) by mouth 2 (two) times daily.   30 tablet   0   . testosterone cypionate (DEPO-TESTOSTERONE) 200 MG/ML injection   Intramuscular   Inject 100 mg into the muscle every 28 (twenty-eight) days.            BP 126/58  Pulse 58  Resp 13  SpO2 90%  Physical Exam  Nursing note and vitals reviewed. Constitutional: He is oriented to person, place, and time. He appears well-developed and well-nourished.  HENT:  Head: Normocephalic and atraumatic.  Mouth/Throat: No oropharyngeal exudate.  Eyes: Conjunctivae and EOM are normal. Pupils are equal,  round, and reactive to light. Right eye exhibits no discharge. Left eye exhibits no discharge. No scleral icterus.  Neck: Normal range of motion. Neck supple. No JVD present.  Cardiovascular: Normal rate, regular rhythm, normal heart sounds and intact distal pulses.  Exam reveals no gallop and no friction rub.   No murmur heard. Pulmonary/Chest: Effort normal and breath sounds normal. No respiratory distress. He has no wheezes. He has no rales. He exhibits no tenderness.  Abdominal: Soft. Bowel sounds are normal. He exhibits no distension and no mass. There is tenderness. There is guarding. There is no rebound.  Moderately tender to the epigastric region and upper quadrants, no right lower quadrant or McBurney point tenderness, no peritoneal signs, no left lower cord tenderness.  Musculoskeletal: Normal range of motion. He exhibits no edema and no tenderness.  Neurological: He is alert and oriented to person, place, and time.  Skin: Skin is warm and dry.  Psychiatric:  Sometimes inappropriate, overly emotional    ED Course  Procedures (including critical care time)  Labs Reviewed  CBC WITH DIFFERENTIAL - Abnormal; Notable for the following:    Neutrophils Relative 84 (*)    Neutro Abs 8.2 (*)    Lymphocytes Relative 8 (*)    All other components within normal limits  COMPREHENSIVE METABOLIC PANEL - Abnormal; Notable for the following:    Glucose, Bld 151 (*)    Creatinine, Ser 1.43 (*)    Alkaline Phosphatase 118 (*)    GFR calc non Af Amer 55 (*)    GFR calc Af Amer 64 (*)    All other components within normal limits  URINALYSIS, MICROSCOPIC ONLY   Results for orders placed during the hospital encounter of 03/22/12  CBC WITH DIFFERENTIAL      Result Value Range   WBC 9.8  4.0 - 10.5 K/uL   RBC 5.35  4.22 - 5.81 MIL/uL   Hemoglobin 15.8  13.0 - 17.0 g/dL   HCT 40.9  81.1 - 91.4 %   MCV 83.7  78.0 - 100.0 fL   MCH 29.5  26.0 - 34.0 pg   MCHC 35.3  30.0 - 36.0 g/dL   RDW 78.2   95.6 - 21.3 %  Platelets 302  150 - 400 K/uL   Neutrophils Relative 84 (*) 43 - 77 %   Neutro Abs 8.2 (*) 1.7 - 7.7 K/uL   Lymphocytes Relative 8 (*) 12 - 46 %   Lymphs Abs 0.8  0.7 - 4.0 K/uL   Monocytes Relative 8  3 - 12 %   Monocytes Absolute 0.8  0.1 - 1.0 K/uL   Eosinophils Relative 0  0 - 5 %   Eosinophils Absolute 0.0  0.0 - 0.7 K/uL   Basophils Relative 0  0 - 1 %   Basophils Absolute 0.0  0.0 - 0.1 K/uL  COMPREHENSIVE METABOLIC PANEL      Result Value Range   Sodium 143  135 - 145 mEq/L   Potassium 4.0  3.5 - 5.1 mEq/L   Chloride 105  96 - 112 mEq/L   CO2 26  19 - 32 mEq/L   Glucose, Bld 151 (*) 70 - 99 mg/dL   BUN 13  6 - 23 mg/dL   Creatinine, Ser 3.08 (*) 0.50 - 1.35 mg/dL   Calcium 9.6  8.4 - 65.7 mg/dL   Total Protein 8.1  6.0 - 8.3 g/dL   Albumin 4.1  3.5 - 5.2 g/dL   AST 30  0 - 37 U/L   ALT 35  0 - 53 U/L   Alkaline Phosphatase 118 (*) 39 - 117 U/L   Total Bilirubin 0.4  0.3 - 1.2 mg/dL   GFR calc non Af Amer 55 (*) >90 mL/min   GFR calc Af Amer 64 (*) >90 mL/min   US Abdomen Complete  03/08/2012  *RADIOLOGY REPORT*  Clinical Data:  Abdominal pain.  ABDOMINAL ULTRASOUND COMPLETE  Comparison:  CT of the abdomen and pelvis performed 02/12/2012  Findings:  Gallbladder:  The gallbladder is normal in appearance, without evidence for gallstones, gallbladder wall thickening or pericholecystic fluid.  No ultrasonographic Murphy's sign is elicited, though evaluation is limited due to patient motion and diffuse abdominal pain.  Common Bile Duct:  Not visualized due to overlying bowel gas and patient motion.  Liver:  Normal parenchymal echogenicity and echotexture; no focal lesions identified.  Limited Doppler evaluation demonstrates normal blood flow within the liver.  IVC:  Unremarkable in appearance.  Pancreas:  Not well characterized due to patient motion and overlying bowel gas.  Spleen:  9.3 cm in length; within normal limits in size and echotexture though difficult to  fully characterize.  Right kidney:  10.4 cm in length; normal in size, configuration and parenchymal echogenicity.  No evidence of mass or hydronephrosis.  Left kidney:  10.0 cm in length; normal in size, configuration and parenchymal echogenicity.  No evidence of mass or hydronephrosis.  Abdominal Aorta:  Normal in caliber; no aneurysm identified.  Not visualized distally due to overlying bowel gas.  IMPRESSION: Unremarkable abdominal ultrasound.  Evaluation limited due to patient motion and overlying bowel gas.   Original Report Authenticated By: Tonia Ghent, M.D.    Ct Abdomen Pelvis W Contrast  03/22/2012  *RADIOLOGY REPORT*  Clinical Data: Altered mental status.  Abdominal pain.  CT ABDOMEN AND PELVIS WITH CONTRAST  Technique:  Multidetector CT imaging of the abdomen and pelvis was performed following the standard protocol during bolus administration of intravenous contrast.  Contrast: OMNIPAQUE IOHEXOL 300 MG/ML  SOLN  Comparison: 03/09/2012 from [facility].  Findings: Technically limited study due to motion artifact.  There appears to be infiltration or atelectasis in the lung bases. Suggestion of thickening  of the lower esophageal wall which may represent reflux disease.  The liver, spleen, gallbladder, pancreas, adrenal glands, abdominal aorta, and retroperitoneal lymph nodes are unremarkable.  Sub centimeter low attenuation lesions in the kidneys bilaterally suggesting small cysts.  No hydronephrosis or solid mass appreciated.  The stomach, small bowel, and colon are not abnormally distended and no discrete wall thickening is demonstrated.  No free air or free fluid in the abdomen.  Pelvis:  Prostate gland is not enlarged.  Bladder wall is not thickened.  No free or loculated pelvic fluid collections. Scattered diverticula in the sigmoid colon without diverticulitis. The appendix is not identified.  There is a small left inguinal hernia containing fat.  Normal alignment of the lumbar  vertebrae. Focal area of nonspecific sclerosis in the right iliac bone is stable since the previous study from 11/23/2008, consistent with benign sclerosis.  IMPRESSION: Infiltration or atelectasis in the lung bases.  Thickening of the distal esophageal wall suggesting possible esophagitis.  No acute process demonstrated in the abdomen or pelvis.  Study is technically limited due to motion artifact.   Original Report Authenticated By: Burman Nieves, M.D.    Ct Abdomen Pelvis W Contrast  03/09/2012  *RADIOLOGY REPORT*  Clinical Data: Abdominal pain mainly after eating.  Elevated LFTs. History pancreatitis.  CT ABDOMEN AND PELVIS WITH CONTRAST  Technique:  Multidetector CT imaging of the abdomen and pelvis was performed following the standard protocol during bolus administration of intravenous contrast.  Contrast: 50mL OMNIPAQUE IOHEXOL 300 MG/ML  SOLN, OMNIPAQUE IOHEXOL 300 MG/ML  SOLN  Comparison: 03/08/2012 ultrasound.  02/12/2012 CT.  Findings: Basilar parenchymal changes unchanged suggestive of scarring.  Motion degraded exam.  Taking into account limitation of motion, no worrisome hepatic, splenic, pancreatic, adrenal or renal lesion. Renal low density structures larger on the left unchanged from 2010 exam. There cannot be confirmed as simple cysts.  No extraluminal bowel inflammatory process, free fluid or free air. The appendix is not visualized.  No calcified gallstones.  Decompressed noncontrast filled views of the urinary bladder unremarkable.  No bony destructive lesion.  Mild atherosclerotic type changes of the abdominal aorta.  No aneurysmal dilation.  No bowel containing hernia.  Patulous appearance of the distal esophagus may be related to under distension and / or decompressed hiatal hernia.  IMPRESSION: Motion degraded examination without bowel inflammatory process or visceral abnormality detected as a cause of patient's symptoms. Please see above discussion.   Original Report  Authenticated By: Lacy Duverney, M.D.    Dg Abd Acute W/chest  03/08/2012  *RADIOLOGY REPORT*  Clinical Data: Abdominal pain and distention.  ACUTE ABDOMEN SERIES (ABDOMEN 2 VIEW & CHEST 1 VIEW)  Comparison: 02/12/2012  Findings: Low lung volumes noted with mild airspace opacity along the left hemidiaphragm.  No free intraperitoneal gas noted beneath the hemidiaphragms.  Mild gastric distention noted.  Air-fluid levels are present in the descending colon, where they can be normal.  Gas is present throughout the large bowel.  No dilated small bowel observed.  No centralization of bowel loops to favor ascites.  IMPRESSION:  1.  Borderline distended stomach. 2.  Gas throughout the colon, without a significant amount of formed stool, although this may be incidental. 3.  No dilated bowel to favor obstruction. 4.  Indistinct airspace opacity left lung base, potentially reflecting atelectasis or pneumonia.   Original Report Authenticated By: Gaylyn Rong, M.D.      Ct Abdomen Pelvis W Contrast  03/22/2012  *RADIOLOGY REPORT*  Clinical Data:  Altered mental status.  Abdominal pain.  CT ABDOMEN AND PELVIS WITH CONTRAST  Technique:  Multidetector CT imaging of the abdomen and pelvis was performed following the standard protocol during bolus administration of intravenous contrast.  Contrast: OMNIPAQUE IOHEXOL 300 MG/ML  SOLN  Comparison: 03/09/2012 from [facility].  Findings: Technically limited study due to motion artifact.  There appears to be infiltration or atelectasis in the lung bases. Suggestion of thickening of the lower esophageal wall which may represent reflux disease.  The liver, spleen, gallbladder, pancreas, adrenal glands, abdominal aorta, and retroperitoneal lymph nodes are unremarkable.  Sub centimeter low attenuation lesions in the kidneys bilaterally suggesting small cysts.  No hydronephrosis or solid mass appreciated.  The stomach, small bowel, and colon are not abnormally  distended and no discrete wall thickening is demonstrated.  No free air or free fluid in the abdomen.  Pelvis:  Prostate gland is not enlarged.  Bladder wall is not thickened.  No free or loculated pelvic fluid collections. Scattered diverticula in the sigmoid colon without diverticulitis. The appendix is not identified.  There is a small left inguinal hernia containing fat.  Normal alignment of the lumbar vertebrae. Focal area of nonspecific sclerosis in the right iliac bone is stable since the previous study from 11/23/2008, consistent with benign sclerosis.  IMPRESSION: Infiltration or atelectasis in the lung bases.  Thickening of the distal esophageal wall suggesting possible esophagitis.  No acute process demonstrated in the abdomen or pelvis.  Study is technically limited due to motion artifact.   Original Report Authenticated By: Burman Nieves, M.D.      1. Gastroparesis       MDM  52 year old male with abdominal pain, he has a history of gastroparesis and pancreatitis. His lipase was elevated earlier this morning and was 91. He lives in a group home, which refuses to take the patient back in his current state. And they say that his abdominal pain is worsening, and that it is similar to other times when he has required admission. Patient has received IV Dilaudid in the emergency department with good relief. He is now watching TV peacefully. I have discussed the patient with Dr. Deretha Emory, who agrees that the patient will need to be admitted if the group home is unwilling to take him back.  I have consulted the hospitalists, who will admit the patient for pain management and obs.       Roxy Horseman, PA-C 03/22/12 [phone]

## 2012-03-22 NOTE — ED Notes (Signed)
Pt laughing/crying hysterically. Vomiting x 1, states abdominal pain since this morning. Generalized abdominal tenderness.

## 2012-03-22 NOTE — ED Notes (Signed)
[facility] [phone] to inform them he will be returning.  Has an appointment with Dr  Julio Sicks

## 2012-03-22 NOTE — ED Notes (Signed)
Pt discharged at 6am from ED for abdominal pain. Returns via EMS crying, increased pain at home from group home.

## 2012-03-22 NOTE — ED Provider Notes (Signed)
Medical screening examination/treatment/procedure(s) were conducted as a shared visit with non-physician practitioner(s) and myself.  I personally evaluated the patient during the encounter  Seen this AM with extensive work up to include CT, no sig findings other than possible pancreatitis. Sent back for recurrent pain, discussed with Group Home they will not take him back until pain free and able to take po. Arranged admission. Pain improved with 1 mg dilaudid.   Shelda Jakes, MD 03/22/12 [phone]

## 2012-03-22 NOTE — ED Notes (Signed)
Patient presents by EMS with abd pain, vomiting, some MR

## 2012-03-22 NOTE — ED Notes (Signed)
Pt calm, resting, states feeling better. Much more relaxed.

## 2012-03-22 NOTE — ED Notes (Signed)
Pt is calm, watching TV. Is no longer yelling or crying. States "My stomach feels better now that I'm not eating all that food". No more episodes of emesis.

## 2012-03-22 NOTE — Progress Notes (Signed)
1700 Patient arrived to floor from ED placed on telemetry. Skin WNL . Abdomen distention noted.

## 2012-03-22 NOTE — ED Notes (Signed)
Patient difficult to assess.  Patient spiting on the floor, in the trash can and in the bath basin.  Patient making animal noises.  Staff at the group home stated they have sent him to [facility] 3 times and sent back each time saying he has turretts and did one time give him Reglan.  They feel there is something else wrong with him

## 2012-03-22 NOTE — ED Notes (Signed)
Attempt to call report x 1  

## 2012-03-22 NOTE — ED Notes (Signed)
Shawn Baird wants to be called with discharge at [phone]

## 2012-03-22 NOTE — ED Provider Notes (Signed)
History     CSN: [id number]  Arrival date & time 03/22/12  0042   First MD Initiated Contact with Patient 03/22/12 0107      Chief Complaint  Patient presents with  . Abdominal Pain    (Consider location/radiation/quality/duration/timing/severity/associated sxs/prior treatment) HPI History per patient and group home. Abdominal pain onset tonight with associated nausea and vomiting. Has history of diabetes. No fevers. Pain is sharp and severe. Has history of pancreatitis, mental retardation, and gastroparesis. History of similar symptoms in the past. No bloody emesis. No blood in stools. Past Medical History  Diagnosis Date  . Diabetes mellitus   . Anxiety   . Hypertension   . Hyperlipemia   . Mental disorder   . Schizoaffective disorder   . Impulsive control disorder  . Pancreatitis   . Otitis media   . Cancer   . HOH (hard of hearing)   . Tourette's disease   . Mental retardation     Past Surgical History  Procedure Laterality Date  . 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 High blood pressure Sister   . High Cholesterol Sister   . Heart disease Mother   . Stroke Mother     History  Substance Use Topics  . Smoking status: Never Smoker   . Smokeless tobacco: Never Used  . Alcohol Use: No      Review of Systems  Constitutional: Negative for fever and chills.  HENT: Negative for neck pain and neck stiffness.   Eyes: Negative for pain.  Respiratory: Negative for shortness of breath.   Cardiovascular: Negative for chest pain.  Gastrointestinal: Positive for vomiting and abdominal pain. Negative for blood in stool.  Genitourinary: Negative for dysuria.  Musculoskeletal: Negative for back pain.  Skin: Negative for rash.  Neurological: Negative for headaches.  All other systems reviewed and are negative.    Allergies  Review of patient's allergies indicates no known allergies.  Home  Medications   Current Outpatient Rx  Name  Route  Sig  Dispense  Refill  . amLODipine (NORVASC) 5 MG tablet   Oral   Take 5 mg by mouth daily.         . cholecalciferol (VITAMIN D) 1000 UNITS tablet   Oral   Take 1,000 Units by mouth daily.         Marland Kitchen loratadine (CLARITIN) 10 MG tablet   Oral   Take 10 mg by mouth daily.         . metFORMIN (GLUCOPHAGE-XR) 500 MG 24 hr tablet   Oral   Take 500 mg by mouth daily with breakfast.         . metoCLOPramide (REGLAN) 5 MG tablet   Oral   Take 1 tablet (5 mg total) by mouth 4 (four) times daily -  before meals and at bedtime.   120 tablet   0   . metoprolol tartrate (LOPRESSOR) 12.5 mg TABS   Oral   Take 0.5 tablets (12.5 mg total) by mouth 2 (two) times daily.   30 tablet   0   . polyethylene glycol (MIRALAX / GLYCOLAX) packet   Oral   Take 17 g by mouth every morning.         . pravastatin (PRAVACHOL) 20 MG tablet   Oral   Take 20 mg by mouth at bedtime.          Marland Kitchen  QUEtiapine (SEROQUEL XR) 400 MG 24 hr tablet   Oral   Take 400 mg by mouth at bedtime.         . sucralfate (CARAFATE) 1 GM/10ML suspension   Oral   Take 10 mLs (1 g total) by mouth 4 (four) times daily -  with meals and at bedtime. For 10 days.   420 mL   0   . testosterone cypionate (DEPO-TESTOSTERONE) 200 MG/ML injection   Intramuscular   Inject 200 mg into the muscle every 28 (twenty-eight) days.         . vitamin B-12 (CYANOCOBALAMIN) 1000 MCG tablet   Oral   Take 1,000 mcg by mouth every morning.            BP 166/92  Pulse 90  Temp(Src) 98.5 F (36.9 C) (Oral)  Resp 22  SpO2 100%  Physical Exam  Constitutional: He appears well-developed and well-nourished.  HENT:  Head: Normocephalic and atraumatic.  Eyes: Conjunctivae and EOM are normal. Pupils are equal, round, and reactive to light. No scleral icterus.  Neck: Neck supple.  Cardiovascular: Normal rate, regular rhythm and intact distal pulses.   Pulmonary/Chest:  Effort normal and breath sounds normal. No respiratory distress.  Abdominal: Soft. Bowel sounds are normal. He exhibits no distension.  Tender epigastric and somewhat left upper quadrant with some voluntary guarding but no rebound or acute abdomen otherwise  Musculoskeletal: Normal range of motion. He exhibits no edema.  Neurological:  Awake alert and interactive  Skin: Skin is warm and dry. No rash noted.    ED Course  Procedures (including critical care time)  Results for orders placed during the hospital encounter of 03/22/12  CBC WITH DIFFERENTIAL      Result Value Range   WBC 8.6  4.0 - 10.5 K/uL   RBC 5.45  4.22 - 5.81 MIL/uL   Hemoglobin 16.1  13.0 - 17.0 g/dL   HCT 21.3  08.6 - 57.8 %   MCV 83.5  78.0 - 100.0 fL   MCH 29.5  26.0 - 34.0 pg   MCHC 35.4  30.0 - 36.0 g/dL   RDW 46.9  62.9 - 52.8 %   Platelets 290  150 - 400 K/uL   Neutrophils Relative 67  43 - 77 %   Neutro Abs 5.8  1.7 - 7.7 K/uL   Lymphocytes Relative 19  12 - 46 %   Lymphs Abs 1.7  0.7 - 4.0 K/uL   Monocytes Relative 9  3 - 12 %   Monocytes Absolute 0.8  0.1 - 1.0 K/uL   Eosinophils Relative 4  0 - 5 %   Eosinophils Absolute 0.3  0.0 - 0.7 K/uL   Basophils Relative 1  0 - 1 %   Basophils Absolute 0.0  0.0 - 0.1 K/uL  BASIC METABOLIC PANEL      Result Value Range   Sodium 144  135 - 145 mEq/L   Potassium 3.7  3.5 - 5.1 mEq/L   Chloride 103  96 - 112 mEq/L   CO2 28  19 - 32 mEq/L   Glucose, Bld 180 (*) 70 - 99 mg/dL   BUN 17  6 - 23 mg/dL   Creatinine, Ser 4.13 (*) 0.50 - 1.35 mg/dL   Calcium 9.5  8.4 - 24.4 mg/dL   GFR calc non Af Amer 48 (*) >90 mL/min   GFR calc Af Amer 56 (*) >90 mL/min  LIPASE, BLOOD      Result Value Range  Lipase 91 (*) 11 - 59 U/L  GLUCOSE, CAPILLARY      Result Value Range   Glucose-Capillary 161 (*) 70 - 99 mg/dL  POCT I-STAT, CHEM 8      Result Value Range   Sodium 143  135 - 145 mEq/L   Potassium 3.6  3.5 - 5.1 mEq/L   Chloride 104  96 - 112 mEq/L   BUN 18  6  - 23 mg/dL   Creatinine, Ser 1.61 (*) 0.50 - 1.35 mg/dL   Glucose, Bld 096 (*) 70 - 99 mg/dL   Calcium, Ion 0.45  4.09 - 1.23 mmol/L   TCO2 31  0 - 100 mmol/L   Hemoglobin 17.0  13.0 - 17.0 g/dL   HCT 81.1  91.4 - 78.2 %   US Abdomen Complete  03/08/2012  *RADIOLOGY REPORT*  Clinical Data:  Abdominal pain.  ABDOMINAL ULTRASOUND COMPLETE  Comparison:  CT of the abdomen and pelvis performed 02/12/2012  Findings:  Gallbladder:  The gallbladder is normal in appearance, without evidence for gallstones, gallbladder wall thickening or pericholecystic fluid.  No ultrasonographic Murphy's sign is elicited, though evaluation is limited due to patient motion and diffuse abdominal pain.  Common Bile Duct:  Not visualized due to overlying bowel gas and patient motion.  Liver:  Normal parenchymal echogenicity and echotexture; no focal lesions identified.  Limited Doppler evaluation demonstrates normal blood flow within the liver.  IVC:  Unremarkable in appearance.  Pancreas:  Not well characterized due to patient motion and overlying bowel gas.  Spleen:  9.3 cm in length; within normal limits in size and echotexture though difficult to fully characterize.  Right kidney:  10.4 cm in length; normal in size, configuration and parenchymal echogenicity.  No evidence of mass or hydronephrosis.  Left kidney:  10.0 cm in length; normal in size, configuration and parenchymal echogenicity.  No evidence of mass or hydronephrosis.  Abdominal Aorta:  Normal in caliber; no aneurysm identified.  Not visualized distally due to overlying bowel gas.  IMPRESSION: Unremarkable abdominal ultrasound.  Evaluation limited due to patient motion and overlying bowel gas.   Original Report Authenticated By: Tonia Ghent, M.D.    Ct Abdomen Pelvis W Contrast  03/22/2012  *RADIOLOGY REPORT*  Clinical Data: Altered mental status.  Abdominal pain.  CT ABDOMEN AND PELVIS WITH CONTRAST  Technique:  Multidetector CT imaging of the abdomen and pelvis  was performed following the standard protocol during bolus administration of intravenous contrast.  Contrast: OMNIPAQUE IOHEXOL 300 MG/ML  SOLN  Comparison: 03/09/2012 from [facility].  Findings: Technically limited study due to motion artifact.  There appears to be infiltration or atelectasis in the lung bases. Suggestion of thickening of the lower esophageal wall which may represent reflux disease.  The liver, spleen, gallbladder, pancreas, adrenal glands, abdominal aorta, and retroperitoneal lymph nodes are unremarkable.  Sub centimeter low attenuation lesions in the kidneys bilaterally suggesting small cysts.  No hydronephrosis or solid mass appreciated.  The stomach, small bowel, and colon are not abnormally distended and no discrete wall thickening is demonstrated.  No free air or free fluid in the abdomen.  Pelvis:  Prostate gland is not enlarged.  Bladder wall is not thickened.  No free or loculated pelvic fluid collections. Scattered diverticula in the sigmoid colon without diverticulitis. The appendix is not identified.  There is a small left inguinal hernia containing fat.  Normal alignment of the lumbar vertebrae. Focal area of nonspecific sclerosis in the right iliac bone is  stable since the previous study from 11/23/2008, consistent with benign sclerosis.  IMPRESSION: Infiltration or atelectasis in the lung bases.  Thickening of the distal esophageal wall suggesting possible esophagitis.  No acute process demonstrated in the abdomen or pelvis.  Study is technically limited due to motion artifact.   Original Report Authenticated By: Burman Nieves, M.D.    Ct Abdomen Pelvis W Contrast  03/09/2012  *RADIOLOGY REPORT*  Clinical Data: Abdominal pain mainly after eating.  Elevated LFTs. History pancreatitis.  CT ABDOMEN AND PELVIS WITH CONTRAST  Technique:  Multidetector CT imaging of the abdomen and pelvis was performed following the standard protocol during bolus administration of  intravenous contrast.  Contrast: 50mL OMNIPAQUE IOHEXOL 300 MG/ML  SOLN, OMNIPAQUE IOHEXOL 300 MG/ML  SOLN  Comparison: 03/08/2012 ultrasound.  02/12/2012 CT.  Findings: Basilar parenchymal changes unchanged suggestive of scarring.  Motion degraded exam.  Taking into account limitation of motion, no worrisome hepatic, splenic, pancreatic, adrenal or renal lesion. Renal low density structures larger on the left unchanged from 2010 exam. There cannot be confirmed as simple cysts.  No extraluminal bowel inflammatory process, free fluid or free air. The appendix is not visualized.  No calcified gallstones.  Decompressed noncontrast filled views of the urinary bladder unremarkable.  No bony destructive lesion.  Mild atherosclerotic type changes of the abdominal aorta.  No aneurysmal dilation.  No bowel containing hernia.  Patulous appearance of the distal esophagus may be related to under distension and / or decompressed hiatal hernia.  IMPRESSION: Motion degraded examination without bowel inflammatory process or visceral abnormality detected as a cause of patient's symptoms. Please see above discussion.   Original Report Authenticated By: Lacy Duverney, M.D.    Dg Abd Acute W/chest  03/08/2012  *RADIOLOGY REPORT*  Clinical Data: Abdominal pain and distention.  ACUTE ABDOMEN SERIES (ABDOMEN 2 VIEW & CHEST 1 VIEW)  Comparison: 02/12/2012  Findings: Low lung volumes noted with mild airspace opacity along the left hemidiaphragm.  No free intraperitoneal gas noted beneath the hemidiaphragms.  Mild gastric distention noted.  Air-fluid levels are present in the descending colon, where they can be normal.  Gas is present throughout the large bowel.  No dilated small bowel observed.  No centralization of bowel loops to favor ascites.  IMPRESSION:  1.  Borderline distended stomach. 2.  Gas throughout the colon, without a significant amount of formed stool, although this may be incidental. 3.  No dilated bowel to favor  obstruction. 4.  Indistinct airspace opacity left lung base, potentially reflecting atelectasis or pneumonia.   Original Report Authenticated By: Gaylyn Rong, M.D.      Date: 03/22/2012  Rate: 85  Rhythm: normal sinus rhythm  QRS Axis: normal  Intervals: normal  ST/T Wave abnormalities: nonspecific ST changes  Conduction Disutrbances:none  Narrative Interpretation: sinus with arficat  Old EKG Reviewed: none available  IV fluids. IV Dilaudid. IV Zofran.  CT scan reviewed and on recheck is much more comfortable with pain resolved. Will treat for esophagitis with Pepcid and plan close primary care followup.  MDM  Abdominal pain  Evaluated with imaging and labs reviewed as above.  Old records and previous imaging reviewed as above  Vital Signs and nursing notes reviewed and considered  IV fluids and narcotics provided with condition improved       [name], MD 03/22/12 [phone]

## 2012-03-22 NOTE — H&P (Signed)
[facility] History and Physical  Lazar Tierce WUX:[id number] DOB: [birth year] DOA: 03/22/2012  Referring physician: *  PCP: [name], MD   Chief Complaint: Abdominal Pain    HPI:  The patient is a 52 y.o. year-old M with history of T2DM, HTN, HLD, Schizoaffective d/o, tourette's, mental retardation, pancreatitis who has presented to the ER over the last 24 hours 3 times with complaints of abdominal pain that is made worse by eating. Patient is a difficult historian, frequently answering "yes" to questions that are not yes/no questions. He is inconsistent in his answers, however, he does describe that his pain is worse with eating and located in the lower part of his abdomen. He endorses nausea with vomiting, diarrhea, and constipation.   He had associated nausea and small amt of vomiting, nonbilious/nonbloody. He has had three episodes of similar abdominal pain and nausea and vomiting in the last few months which were diagnosed as viral gastroenteritis vs. Gastroparesis. He was brought back and forth to the ER 2 times, this morning,  [place] abd and CT abd/pelvis were unremarkable during last admission. LFTs have been mildly elevated esp alkaline phosphatase. He is being admitted for pain control and further evaluation of his abdominal pain.        Review of Systems: negative for the following  Constitutional: Denies fever, chills, diaphoresis, appetite change and fatigue.  HEENT: Denies photophobia, eye pain, redness, hearing loss, ear pain, congestion, sore throat, rhinorrhea, sneezing, mouth sores, trouble swallowing, neck pain, neck stiffness and tinnitus.  Respiratory: Denies SOB, DOE, cough, chest tightness, and wheezing.  Cardiovascular: Denies chest pain, palpitations and leg swelling.  Gastrointestinal: Denies nausea, vomiting, abdominal pain, diarrhea, constipation, blood in stool and abdominal distention.  Genitourinary: Denies dysuria, urgency, frequency, hematuria,  flank pain and difficulty urinating.  Musculoskeletal: Denies myalgias, back pain, joint swelling, arthralgias and gait problem.  Skin: Denies pallor, rash and wound.  Neurological: Denies dizziness, seizures, syncope, weakness, light-headedness, numbness and headaches.  Hematological: Denies adenopathy. Easy bruising, personal or family bleeding history  Psychiatric/Behavioral: Denies suicidal ideation, mood changes, confusion, nervousness, sleep disturbance and agitation       Past Medical History  Diagnosis Date  . Diabetes mellitus   . Anxiety   . Hypertension   . Hyperlipemia   . Mental disorder   . Schizoaffective disorder   . Impulsive control disorder  . Pancreatitis   . Otitis media   . Cancer   . HOH (hard of hearing)   . Tourette's disease   . Mental retardation      Past Surgical History  Procedure Laterality Date  . 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 He has never used smokeless tobacco. He reports that he does not drink alcohol or use illicit drugs.    No Known Allergies  Family History  Problem Relation Age of Onset  . High blood pressure Sister   . High Cholesterol Sister   . Heart disease Mother   . Stroke Mother      Prior to Admission medications   Medication Sig Start Date End Date Taking? Authorizing Provider  amLODipine (NORVASC) 5 MG tablet Take 5 mg by mouth daily.   Yes Historical Provider, MD  cholecalciferol (VITAMIN D) 1000 UNITS tablet Take 1,000 Units by mouth daily.   Yes Historical Provider, MD  loratadine (CLARITIN) 10 MG tablet Take 10 mg by mouth  daily.   Yes Historical Provider, MD  metFORMIN (GLUCOPHAGE-XR) 500 MG 24 hr tablet Take 500 mg by mouth daily with breakfast.   Yes Historical Provider, MD  metoCLOPramide (REGLAN) 5 MG tablet Take 1 tablet (5 mg total) by mouth 4 (four) times daily -  before meals and at bedtime. 03/11/12  Yes Osvaldo Shipper,  MD  metoprolol tartrate (LOPRESSOR) 12.5 mg TABS Take 0.5 tablets (12.5 mg total) by mouth 2 (two) times daily. 03/11/12  Yes Osvaldo Shipper, MD  polyethylene glycol (MIRALAX / GLYCOLAX) packet Take 17 g by mouth every morning.   Yes Historical Provider, MD  pravastatin (PRAVACHOL) 20 MG tablet Take 20 mg by mouth at bedtime.    Yes Historical Provider, MD  QUEtiapine (SEROQUEL XR) 400 MG 24 hr tablet Take 400 mg by mouth at bedtime.   Yes Historical Provider, MD  sucralfate (CARAFATE) 1 GM/10ML suspension Take 10 mLs (1 g total) by mouth 4 (four) times daily -  with meals and at bedtime. For 10 days. 03/11/12  Yes Osvaldo Shipper, MD  vitamin B-12 (CYANOCOBALAMIN) 1000 MCG tablet Take 1,000 mcg by mouth every morning.    Yes Historical Provider, MD  famotidine (PEPCID) 20 MG tablet Take 1 tablet (20 mg total) by mouth 2 (two) times daily. 03/22/12   [name], MD  testosterone cypionate (DEPO-TESTOSTERONE) 200 MG/ML injection Inject 100 mg into the muscle every 28 (twenty-eight) days.     Historical Provider, MD     Physical Exam: Filed Vitals:   03/22/12 1430 03/22/12 1445 03/22/12 1500 03/22/12 1637  BP: 106/66 103/62 107/61 133/90  Pulse: 62 57 58 75  Temp:    98 F (36.7 C)  TempSrc:    Oral  Resp: 12 14 13 18   Height:    5\' 5"  (1.651 m)  Weight:    73.301 kg (161 lb 9.6 oz)  SpO2: 91% 94% 90% 95%     Constitutional: Vital signs reviewed. Patient is a well-developed and well-nourished in no acute distress and cooperative with exam. Alert and oriented x3.  Head: Normocephalic and atraumatic  Ear: TM normal bilaterally  Mouth: no erythema or exudates, MMM  Eyes: PERRL, EOMI, conjunctivae normal, No scleral icterus.  Neck: Supple, Trachea midline normal ROM, No JVD, mass, thyromegaly, or carotid bruit present.  Cardiovascular: RRR, S1 normal, S2 normal, no MRG, pulses symmetric and intact bilaterally  Pulmonary/Chest: CTAB, no wheezes, rales, or rhonchi  Abdominal: Soft. Non-tender,  non-distended, bowel sounds are normal, no masses, organomegaly, or guarding present.  GU: no CVA tenderness Musculoskeletal: No joint deformities, erythema, or stiffness, ROM full and no nontender Ext: no edema and no cyanosis, pulses palpable bilaterally (DP and PT)  Hematology: no cervical, inginal, or axillary adenopathy.  Neurological: A&O x3, Strenght is normal and symmetric bilaterally, cranial nerve II-XII are grossly intact, no focal motor deficit, sensory intact to light touch bilaterally.  Skin: Warm, dry and intact. No rash, cyanosis, or clubbing.  Psychiatric: Normal mood and affect. speech and behavior is normal. Judgment and thought content normal. Cognition and memory are normal.       Labs on Admission:    Basic Metabolic Panel:  Recent Labs Lab 03/22/12 0100 03/22/12 0104 03/22/12 1215  NA 144 143 143  K 3.7 3.6 4.0  CL 103 104 105  CO2 28  --  26  GLUCOSE 180* 182* 151*  BUN 17 18 13   CREATININE 1.59* 1.60* 1.43*  CALCIUM 9.5  --  9.6   Liver  Function Tests:  Recent Labs Lab 03/22/12 1215  AST 30  ALT 35  ALKPHOS 118*  BILITOT 0.4  PROT 8.1  ALBUMIN 4.1    Recent Labs Lab 03/22/12 0100  LIPASE 91*   No results found for this basename: AMMONIA,  in the last 168 hours CBC:  Recent Labs Lab 03/22/12 0100 03/22/12 0104 03/22/12 1215  WBC 8.6  --  9.8  NEUTROABS 5.8  --  8.2*  HGB 16.1 17.0 15.8  HCT 45.5 50.0 44.8  MCV 83.5  --  83.7  PLT 290  --  302   Cardiac Enzymes: No results found for this basename: CKTOTAL, CKMB, CKMBINDEX, TROPONINI,  in the last 168 hours  BNP (last 3 results) No results found for this basename: PROBNP,  in the last 8760 hours    CBG:  Recent Labs Lab 03/22/12 0100 03/22/12 1514  GLUCAP 161* 156*    Radiological Exams on Admission: Ct Abdomen Pelvis W Contrast  03/22/2012  *RADIOLOGY REPORT*  Clinical Data: Altered mental status.  Abdominal pain.  CT ABDOMEN AND PELVIS WITH CONTRAST   Technique:  Multidetector CT imaging of the abdomen and pelvis was performed following the standard protocol during bolus administration of intravenous contrast.  Contrast: OMNIPAQUE IOHEXOL 300 MG/ML  SOLN  Comparison: 03/09/2012 from [facility].  Findings: Technically limited study due to motion artifact.  There appears to be infiltration or atelectasis in the lung bases. Suggestion of thickening of the lower esophageal wall which may represent reflux disease.  The liver, spleen, gallbladder, pancreas, adrenal glands, abdominal aorta, and retroperitoneal lymph nodes are unremarkable.  Sub centimeter low attenuation lesions in the kidneys bilaterally suggesting small cysts.  No hydronephrosis or solid mass appreciated.  The stomach, small bowel, and colon are not abnormally distended and no discrete wall thickening is demonstrated.  No free air or free fluid in the abdomen.  Pelvis:  Prostate gland is not enlarged.  Bladder wall is not thickened.  No free or loculated pelvic fluid collections. Scattered diverticula in the sigmoid colon without diverticulitis. The appendix is not identified.  There is a small left inguinal hernia containing fat.  Normal alignment of the lumbar vertebrae. Focal area of nonspecific sclerosis in the right iliac bone is stable since the previous study from 11/23/2008, consistent with benign sclerosis.  IMPRESSION: Infiltration or atelectasis in the lung bases.  Thickening of the distal esophageal wall suggesting possible esophagitis.  No acute process demonstrated in the abdomen or pelvis.  Study is technically limited due to motion artifact.   Original Report Authenticated By: Burman Nieves, M.D.     EKG: Independently reviewed.   Assessment/Plan    Abdominal pain with nausea, vomiting  DDx includes viral gastroenteritis/gastroparesis. No evidence of cholecystitis on [place] or CT during recent admission. No gallstones or CBD enlargement demonstrated on either  study. Lipase negative. UA neg. CT reviewed with radiologist and no stenosis noted in vasculature either. Patient's examination is benign.  started on a PPI . Marland Kitchen His diet was advanced and he seems to have tolrated . A GES was ordered.  Reglan will be  scheduled, before meals and at bedtime.  Transaminitis  Likely secondary to viral syndrome. Bilirubin normal and no evidence of stones. No clear indication for HIDA as no RUQ pain or tenderness. Avoid tylenol  Hypokalemia  This was repleted.  HTN/HLD  BP and cholesterol stable. Defer to PCP. Cont home meds  T2DM  Start SSI CKD stage III  Stable  creatinine. Avoid nephrotoxins , baseline 1.5 Schizoaffective disorder  Seems stable. Continue seroquel  GERD  May be contributing to current sx. PPI as above     Code Status:   full Family Communication: bedside Disposition Plan: admit   Time spent: 70 mins   [facility] Pager [phone]  If 7PM-7AM, please contact night-coverage [email or web address] Password [facility] 03/22/2012, 5:08 PM

## 2012-03-23 ENCOUNTER — Inpatient Hospital Stay (HOSPITAL_COMMUNITY): Payer: Medicare Other

## 2012-03-23 DIAGNOSIS — E119 Type 2 diabetes mellitus without complications: Secondary | ICD-10-CM

## 2012-03-23 DIAGNOSIS — E785 Hyperlipidemia, unspecified: Secondary | ICD-10-CM

## 2012-03-23 DIAGNOSIS — I10 Essential (primary) hypertension: Secondary | ICD-10-CM

## 2012-03-23 LAB — CBC
Hemoglobin: 13.6 g/dL (ref 13.0–17.0)
MCH: 27.8 pg (ref 26.0–34.0)
MCHC: 32.5 g/dL (ref 30.0–36.0)
MCV: 85.3 fL (ref 78.0–100.0)
RBC: 4.9 MIL/uL (ref 4.22–5.81)

## 2012-03-23 LAB — COMPREHENSIVE METABOLIC PANEL
ALT: 24 U/L (ref 0–53)
AST: 19 U/L (ref 0–37)
Albumin: 3.2 g/dL — ABNORMAL LOW (ref 3.5–5.2)
CO2: 25 mEq/L (ref 19–32)
Calcium: 8.6 mg/dL (ref 8.4–10.5)
Chloride: 108 mEq/L (ref 96–112)
Creatinine, Ser: 1.37 mg/dL — ABNORMAL HIGH (ref 0.50–1.35)
Sodium: 141 mEq/L (ref 135–145)

## 2012-03-23 LAB — GLUCOSE, CAPILLARY
Glucose-Capillary: 113 mg/dL — ABNORMAL HIGH (ref 70–99)
Glucose-Capillary: 88 mg/dL (ref 70–99)
Glucose-Capillary: 77 mg/dL (ref 70–99)
Glucose-Capillary: 174 mg/dL — ABNORMAL HIGH (ref 70–99)

## 2012-03-23 LAB — URINALYSIS, MICROSCOPIC ONLY
Bilirubin Urine: NEGATIVE
Ketones, ur: NEGATIVE mg/dL
Nitrite: NEGATIVE
Urobilinogen, UA: 0.2 mg/dL (ref 0.0–1.0)
pH: 7 (ref 5.0–8.0)

## 2012-03-23 LAB — TSH: TSH: 1.693 u[IU]/mL (ref 0.350–4.500)

## 2012-03-23 MED ORDER — BOOST / RESOURCE BREEZE PO LIQD
1.0000 | Freq: Three times a day (TID) | ORAL | Status: DC
  Administered 2012-03-23 – 2012-03-25 (×6): 1 via ORAL

## 2012-03-23 MED ORDER — METOCLOPRAMIDE HCL 5 MG/ML IJ SOLN
10.0000 mg | Freq: Three times a day (TID) | INTRAMUSCULAR | Status: DC
  Administered 2012-03-23 – 2012-03-24 (×2): 10 mg via INTRAVENOUS
  Filled 2012-03-23 (×6): qty 2

## 2012-03-23 MED ORDER — TECHNETIUM TC 99M SULFUR COLLOID
2.0000 | Freq: Once | INTRAVENOUS | Status: AC | PRN
  Administered 2012-03-23: 2 via INTRAVENOUS

## 2012-03-23 NOTE — Clinical Documentation Improvement (Signed)
Clinical Documentation Improvement Program       Query Request  THIS DOCUMENT IS NOT A PERMANENT PART OF THE MEDICAL RECORD  TO RESPOND TO THE THIS QUERY, FOLLOW THE INSTRUCTIONS BELOW:  1. If needed, update documentation for the patient's encounter via the notes activity.  2. Access this query again and click edit on the In [employer].  3. After updating, or not, click F2 to complete all highlighted (required) fields concerning your review. Select "additional documentation in the medical record" OR "no additional documentation provided".  4. Click Sign note button.  5. The deficiency will fall out of your In Basket *Please let us know if you are not able to complete this workflow by phone or e-mail (listed below).         03/23/12  Dear Clerance Lav L. York , PA   In an effort to better capture your patient's severity of illness, reflect appropriate length of stay and utilization of resources, a review of the patient medical record has revealed the following indicators. Please exercise your independent judgment. The fact queries are asked, does not imply that any particular answer is desired or expected. Please clarify and document in a progress note and/or discharge summary the clinical condition associated with the following supporting information. Conditions documented as possible, probable, or suspected can be coded if restated at the time of discharge.    Noted patient has diagnosis of "mental retardation"  if possible please provide acuity of condition.  Thank you   Possible Clinical Conditions:    Profound Mental Retardation  Severe Mental Retardation   Other Condition  Cannot determine  no response from pa chart coded    Thank You,  Leonette Most   Clinical Documentation Specialist, BSN: Pager [phone]  Health Information Management [place]

## 2012-03-23 NOTE — Clinical Social Work Note (Signed)
CSW received consult re: patient being from a group home. CSW contacted the patient's sister, Elease Hashimoto [phone]) and left a voice mail for a call back to confirm d/c plan. CSW will continue to follow patient.  Lia Foyer, LCSWA [facility] Clinical Social Worker Contact #: [phone]

## 2012-03-23 NOTE — Progress Notes (Addendum)
INITIAL NUTRITION ASSESSMENT  DOCUMENTATION CODES Per approved criteria  -Not Applicable   INTERVENTION: 1. Resource Breeze TID. Each supplement provides 250 kcal and 9 grams of protein.  2. RD to follow nutrition care plan   NUTRITION DIAGNOSIS: Inadequate oral intake related to abdominal pain 2/2 gastroparesis as evidenced by 0% meal completion.   Goal: Pt to meet >/= 90% of estimated needs   Monitor:  Weight, po intake, toleration of supplements, toleration of diet advancement  Reason for Assessment: Malnutrition Screening Tool (MST=2)  52 y.o. male  Admitting Dx: Gastroparesis   ASSESSMENT: Pt presents to ED c/o pain, nausea, and vomiting. Pt has PMH of DM, pancreatitis, mental retardation, HYT, hyperlipidemia, tourette's disease and gastroparesis. Pt has presented to the ER multiple times with complaints of abdominal pain that is made worse by eating. MD notes pt is poor historian and has problems answering questions.    Dietetic intern unable to obtain nutrition hx from patient -- currently not in room; in procedure. RN reports that according to family, pt is normally a good eater but due to his pain, he is unable to eat enough. Pt currently on clear liquid diet. Will send nutrition supplement to help meet calorie and protein needs during hospital stay.  Height: Ht Readings from Last 1 Encounters:  03/22/12 5\' 5"  (1.651 m)    Weight: Wt Readings from Last 1 Encounters:  03/22/12 161 lb 9.6 oz (73.301 kg)    Ideal Body Weight: 136 lbs   % Ideal Body Weight: 118%  Wt Readings from Last 10 Encounters:  03/22/12 161 lb 9.6 oz (73.301 kg)  03/09/12 165 lb 5.5 oz (75 kg)  01/15/12 171 lb 4.8 oz (77.7 kg)    Usual Body Weight: n/a  % Usual Body Weight: n/a  BMI:  Body mass index is 26.89 kg/(m^2). Overweight   Estimated Nutritional Needs: Kcal: 1800-2000 Protein: 75-90 gm  Fluid: 1.8 - 2 L  Skin: intact   Diet Order: Clear Liquid  EDUCATION NEEDS: -No  education needs identified at this time   Intake/Output Summary (Last 24 hours) at 03/23/12 0904 Last data filed at 03/23/12 0848  Gross per 24 hour  Intake 1078.33 ml  Output      2 ml  Net 1076.33 ml    Last BM: 03/22/2012   Labs:   Recent Labs Lab 03/22/12 0100 03/22/12 0104 03/22/12 1215 03/23/12 0705  NA 144 143 143 141  K 3.7 3.6 4.0 3.7  CL 103 104 105 108  CO2 28  --  26 25  BUN 17 18 13 9   CREATININE 1.59* 1.60* 1.43* 1.37*  CALCIUM 9.5  --  9.6 8.6  GLUCOSE 180* 182* 151* 81    CBG (last 3)   Recent Labs  03/22/12 1635 03/22/12 2247 03/23/12 0735  GLUCAP 148* 113* 88    Scheduled Meds: . Chlorhexidine Gluconate Cloth  6 each Topical Q0600  . enoxaparin (LOVENOX) injection  40 mg Subcutaneous Q24H  . insulin aspart  0-15 Units Subcutaneous TID WC  . loratadine  10 mg Oral Daily  . metoCLOPramide (REGLAN) injection  10 mg Intravenous Q8H  . metoprolol tartrate  12.5 mg Oral BID  . mupirocin ointment  1 application Nasal BID  . pantoprazole (PROTONIX) IV  40 mg Intravenous Q12H  . QUEtiapine  400 mg Oral QHS  . simvastatin  5 mg Oral q1800  . sodium chloride  3 mL Intravenous Q12H  . sucralfate  1 g Oral TID WC &  HS  . vitamin B-12  1,000 mcg Oral q morning - 10a    Continuous Infusions: . 0.9 % NaCl with KCl 20 mEq / L 100 mL/hr at 03/23/12 5409    Past Medical History  Diagnosis Date  . Diabetes mellitus   . Anxiety   . Hypertension   . Hyperlipemia   . Mental disorder   . Schizoaffective disorder   . Impulsive control disorder  . Pancreatitis   . Otitis media   . Cancer   . HOH (hard of hearing)   . Tourette's disease   . Mental retardation     Past Surgical History  Procedure Laterality Date  . 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

## 2012-03-23 NOTE — Clinical Documentation Improvement (Signed)
DIABETIC  DOCUMENTATION CLARIFICATION QUERY  THIS DOCUMENT IS NOT A PERMANENT PART OF THE MEDICAL RECORD  TO RESPOND TO THE THIS QUERY, FOLLOW THE INSTRUCTIONS BELOW:  1. If needed, update documentation for the patient's encounter via the notes activity.  2. Access this query again and click edit on the In [employer].  3. After updating, or not, click F2 to complete all highlighted (required) fields concerning your review. Select "additional documentation in the medical record" OR "no additional documentation provided".  4. Click Sign note button.  5. The deficiency will fall out of your In Basket *Please let us know if you are not able to complete this workflow by phone or e-mail (listed below).  Please update your documentation within the medical record to reflect your response to this query.                                                                                        03/23/12   Dear Clerance Lav L. York, [place] [facility],  In a better effort to capture your patient's severity of illness, reflect appropriate length of stay and utilization of resources, a review of the patient medical record has revealed the following indicators.    Based on your clinical judgment, please clarify and document in a progress note and/or discharge summary the clinical condition associated with the following supporting information:  In responding to this query please exercise your independent judgment.  The fact that a query is asked, does not imply that any particular answer is desired or expected.  Please clarify and specify Diabetes type, control, manifestations, and associated conditions.  Patient admitted with viral gastroenteritis vs gastroparesis.  If there is a relationship between diabetes and gastroparesis please document ? Thank you   Associated conditions:  DM gastroparesis  Other Condition  Cannot Clinically determine        You may use possible, probable, or suspect with  inpatient documentation. possible, probable, suspected diagnoses MUST be documented at the time of discharge  Reviewed no response from pa chart coded   Thank You,  Lavonda Jumbo  Clinical Documentation Specialist, BSN: Pager [phone]  Health Information Management [place]

## 2012-03-23 NOTE — Progress Notes (Signed)
[facility] PROGRESS NOTE  Shawn Baird HQI:[id number] DOB: Dec 29, 1960 DOA: 03/22/2012 PCP: [name], MD  Assessment/Plan: Abdominal pain with nausea, vomiting  DDx includes viral gastroenteritis/gastroparesis.  No evidence of cholecystitis on [place] or CT during recent admission.  Lipase negative. UA neg. CT reviewed with radiologist and no stenosis noted in vasculature Patient's examination is benign.  On PPI IV BID and Reglan AC & HS.  Gastric Emptying Scan Pending today. (reglan held for 6 hours prior)  Transaminitis  Appears very mild.  Resolved on lab work 2/26  Hypokalemia  This was repleted.   HTN/HLD  BP and cholesterol stable. Defer to PCP. Cont home meds   T2DM   SSI inpatient.  On metformin outpatient.  Check Hgb A1c  CKD stage III  Stable creatinine. Avoid nephrotoxins , baseline 1.5   Schizoaffective disorder  Seems stable. Continue seroquel   GERD  May be contributing to current sx. PPI as above    Code Status: full Family Communication:  Disposition Plan: to group home when appropriate.  Inpatient.   Consultants:    Procedures:    Antibiotics:    HPI/Subjective: The patient is a 52 y.o. year-old M with history of T2DM, HTN, HLD, Schizoaffective d/o, tourette's, mental retardation, pancreatitis who has presented to the ER over the last 24 hours 3 times with complaints of abdominal pain that is made worse by eating. Patient is a difficult historian, frequently answering "yes" to questions that are not yes/no questions. He is inconsistent in his answers, however, he does describe that his pain is worse with eating and located in the lower part of his abdomen. He endorses nausea with vomiting, diarrhea, and constipation. He had associated nausea and small amt of vomiting, nonbilious/nonbloody. He has had three episodes of similar abdominal pain and nausea and vomiting in the last few months which were diagnosed as viral gastroenteritis vs.  Gastroparesis. He was brought back and forth to the ER 2 times, this morning, [place] abd and CT abd/pelvis were unremarkable during last admission. LFTs have been mildly elevated esp alkaline phosphatase. He is being admitted for pain control and further evaluation of his abdominal pain.    Objective: Filed Vitals:   03/22/12 1500 03/22/12 1637 03/22/12 2109 03/23/12 0500  BP: 107/61 133/90 137/83 123/82  Pulse: 58 75 76 64  Temp:  98 F (36.7 C) 97.8 F (36.6 C) 97.3 F (36.3 C)  TempSrc:  Oral Oral Oral  Resp: 13 18 18 20   Height:  5\' 5"  (1.651 m)    Weight:  73.301 kg (161 lb 9.6 oz)    SpO2: 90% 95% 99% 100%    Intake/Output Summary (Last 24 hours) at 03/23/12 1153 Last data filed at 03/23/12 0848  Gross per 24 hour  Intake 1078.33 ml  Output      2 ml  Net 1076.33 ml   Filed Weights   03/22/12 1637  Weight: 73.301 kg (161 lb 9.6 oz)    Exam:   General:  WD, overweight, pleasant, AA male, lying in bed.  Has MR. Orientated to person and place but unable to answer detailed questions.  Cardiovascular: rrr, no m/r/g  Respiratory: cta, no w/c/r  Abdomen: + distention, + periumbilical hernia, soft, min bowel sounds, non tender  Data Reviewed: Basic Metabolic Panel:  Recent Labs Lab 03/22/12 0100 03/22/12 0104 03/22/12 1215 03/23/12 0705  NA 144 143 143 141  K 3.7 3.6 4.0 3.7  CL 103 104 105 108  CO2 28  --  26 25  GLUCOSE 180* 182* 151* 81  BUN 17 18 13 9   CREATININE 1.59* 1.60* 1.43* 1.37*  CALCIUM 9.5  --  9.6 8.6   Liver Function Tests:  Recent Labs Lab 03/22/12 1215 03/23/12 0705  AST 30 19  ALT 35 24  ALKPHOS 118* 96  BILITOT 0.4 0.4  PROT 8.1 6.4  ALBUMIN 4.1 3.2*    Recent Labs Lab 03/22/12 0100  LIPASE 91*   CBC:  Recent Labs Lab 03/22/12 0100 03/22/12 0104 03/22/12 1215 03/22/12 1947 03/23/12 0705  WBC 8.6  --  9.8 8.4 6.0  NEUTROABS 5.8  --  8.2*  --   --   HGB 16.1 17.0 15.8 15.3 13.6  HCT 45.5 50.0 44.8 44.3 41.8  MCV  83.5  --  83.7 84.2 85.3  PLT 290  --  302 288 246   CBG:  Recent Labs Lab 03/22/12 0100 03/22/12 1514 03/22/12 1635 03/22/12 2247 03/23/12 0735  GLUCAP 161* 156* 148* 113* 88    Recent Results (from the past 240 hour(s))  MRSA PCR SCREENING     Status: Abnormal   Collection Time    03/22/12  7:44 PM      Result Value Range Status   MRSA by PCR POSITIVE (*) NEGATIVE Final   Comment:            The GeneXpert MRSA Assay (FDA     approved for NASAL specimens     only), is one component of a     comprehensive MRSA colonization     surveillance program. It is not     intended to diagnose MRSA     infection nor to guide or     monitor treatment for     MRSA infections.     RESULT CALLED TO, READ BACK BY AND VERIFIED WITH:     Tyrell Antonio RN 2233 03/22/12 A BROWNING     Studies: Ct Abdomen Pelvis W Contrast  03/22/2012  *RADIOLOGY REPORT*  Clinical Data: Altered mental status.  Abdominal pain.  CT ABDOMEN AND PELVIS WITH CONTRAST  Technique:  Multidetector CT imaging of the abdomen and pelvis was performed following the standard protocol during bolus administration of intravenous contrast.  Contrast: OMNIPAQUE IOHEXOL 300 MG/ML  SOLN  Comparison: 03/09/2012 from [facility].  Findings: Technically limited study due to motion artifact.  There appears to be infiltration or atelectasis in the lung bases. Suggestion of thickening of the lower esophageal wall which may represent reflux disease.  The liver, spleen, gallbladder, pancreas, adrenal glands, abdominal aorta, and retroperitoneal lymph nodes are unremarkable.  Sub centimeter low attenuation lesions in the kidneys bilaterally suggesting small cysts.  No hydronephrosis or solid mass appreciated.  The stomach, small bowel, and colon are not abnormally distended and no discrete wall thickening is demonstrated.  No free air or free fluid in the abdomen.  Pelvis:  Prostate gland is not enlarged.  Bladder wall is not thickened.   No free or loculated pelvic fluid collections. Scattered diverticula in the sigmoid colon without diverticulitis. The appendix is not identified.  There is a small left inguinal hernia containing fat.  Normal alignment of the lumbar vertebrae. Focal area of nonspecific sclerosis in the right iliac bone is stable since the previous study from 11/23/2008, consistent with benign sclerosis.  IMPRESSION: Infiltration or atelectasis in the lung bases.  Thickening of the distal esophageal wall suggesting possible esophagitis.  No acute process demonstrated in the abdomen or pelvis.  Study is technically limited due to motion artifact.   Original Report Authenticated By: Burman Nieves, M.D.     Scheduled Meds: . Chlorhexidine Gluconate Cloth  6 each Topical [id number]  . enoxaparin (LOVENOX) injection  40 mg Subcutaneous Q24H  . feeding supplement  1 Container Oral TID BM  . insulin aspart  0-15 Units Subcutaneous TID WC  . loratadine  10 mg Oral Daily  . metoCLOPramide (REGLAN) injection  10 mg Intravenous Q8H  . metoprolol tartrate  12.5 mg Oral BID  . mupirocin ointment  1 application Nasal BID  . pantoprazole (PROTONIX) IV  40 mg Intravenous Q12H  . QUEtiapine  400 mg Oral QHS  . simvastatin  5 mg Oral q1800  . sodium chloride  3 mL Intravenous Q12H  . sucralfate  1 g Oral TID WC & HS  . vitamin B-12  1,000 mcg Oral q morning - 10a   Continuous Infusions: . 0.9 % NaCl with KCl 20 mEq / L 100 mL/hr at 03/23/12 0545    Active Problems:   Diabetes   Hypertension   Hyperlipidemia   Schizoaffective disorder   GERD (gastroesophageal reflux disease)   Gastroparesis   Stephani Police, PA-C  [facility] Pager [phone]. If 8PM-8AM, please contact night-coverage at [email or web address], password [facility] 03/23/2012, 11:53 AM  LOS: 1 day    Attending Patient seen and examined. Admitted with vomiting. Awaiting  A Gastric Emptying Study-following that-will start trial of clear liquids. Agree with the  assessment and plan as outlined above  Windell Norfolk MD

## 2012-03-24 DIAGNOSIS — K219 Gastro-esophageal reflux disease without esophagitis: Secondary | ICD-10-CM

## 2012-03-24 LAB — COMPREHENSIVE METABOLIC PANEL
BUN: 10 mg/dL (ref 6–23)
CO2: 24 mEq/L (ref 19–32)
Calcium: 8.8 mg/dL (ref 8.4–10.5)
Chloride: 105 mEq/L (ref 96–112)
Creatinine, Ser: 1.46 mg/dL — ABNORMAL HIGH (ref 0.50–1.35)
GFR calc non Af Amer: 54 mL/min — ABNORMAL LOW (ref >90)
Total Bilirubin: 0.5 mg/dL (ref 0.3–1.2)

## 2012-03-24 LAB — GLUCOSE, CAPILLARY: Glucose-Capillary: 166 mg/dL — ABNORMAL HIGH (ref 70–99)

## 2012-03-24 LAB — HEMOGLOBIN A1C
Hgb A1c MFr Bld: 6.2 % — ABNORMAL HIGH (ref <5.7)
Mean Plasma Glucose: 131 mg/dL — ABNORMAL HIGH (ref <117)

## 2012-03-24 MED ORDER — METOCLOPRAMIDE HCL 10 MG PO TABS
10.0000 mg | ORAL_TABLET | Freq: Three times a day (TID) | ORAL | Status: DC
  Administered 2012-03-24 – 2012-03-25 (×5): 10 mg via ORAL
  Filled 2012-03-24 (×8): qty 1

## 2012-03-24 MED ORDER — SODIUM CHLORIDE 0.9 % IJ SOLN
3.0000 mL | Freq: Two times a day (BID) | INTRAMUSCULAR | Status: DC
  Administered 2012-03-24 – 2012-03-25 (×2): 3 mL via INTRAVENOUS

## 2012-03-24 MED ORDER — ENSURE PUDDING PO PUDG
1.0000 | Freq: Three times a day (TID) | ORAL | Status: DC
  Administered 2012-03-24 – 2012-03-25 (×4): 1 via ORAL

## 2012-03-24 MED ORDER — PANTOPRAZOLE SODIUM 40 MG PO TBEC
40.0000 mg | DELAYED_RELEASE_TABLET | Freq: Two times a day (BID) | ORAL | Status: DC
  Administered 2012-03-24 – 2012-03-25 (×3): 40 mg via ORAL
  Filled 2012-03-24 (×3): qty 1

## 2012-03-24 NOTE — Progress Notes (Signed)
[facility] PROGRESS NOTE  Shawn Baird ZOX:[id number] DOB: September 06, 1960 DOA: 03/22/2012 PCP: [name], MD  Assessment/Plan: Abdominal pain with nausea, vomiting  Likely from gastroparesis No evidence of cholecystitis on [place] or CT during recent admission.  Lipase negative. UA neg. CT reviewed with radiologist and no stenosis noted in vasculature Patient's examination is benign.  On PPI po bid and Reglan po AC & HS.  Gastric Emptying Scan:  only 4% emptying at 1 hour and 57% emptying at 2 hours. Normal is greater than 70% empty at 2 hours. Tolerating clears at breakfast this morning.  Will advance diet as tolerated to low fiber, small portions, add snacks between meals.  Transaminitis  Appears very mild.  Resolved on lab work 2/26  Hypokalemia  This was repleted.   HTN/HLD  BP and cholesterol stable. Defer to PCP. Cont home meds   T2DM   SSI inpatient.  On metformin outpatient.  Hgb A1C pending.  CKD stage III  Stable creatinine. Avoid nephrotoxins , baseline 1.5   Schizoaffective disorder  Seems stable. Continue seroquel   GERD  May be contributing to current sx. PPI as above   DVT prophylaxis SCDs.  Social: Per his sister, they recently lost their mother.   Code Status: full Family Communication:  Disposition Plan: to group home likely 2/28   HPI/Subjective: The patient is a 52 y.o. year-old M with history of T2DM, HTN, HLD, Schizoaffective d/o, tourette's, mental retardation, pancreatitis who has presented to the ER over the last 24 hours 3 times with complaints of abdominal pain that is made worse by eating. Patient is a difficult historian, frequently answering "yes" to questions that are not yes/no questions. He is inconsistent in his answers, however, he does describe that his pain is worse with eating and located in the lower part of his abdomen. He endorses nausea with vomiting, diarrhea, and constipation. He had associated nausea and small amt of  vomiting, nonbilious/nonbloody. He has had three episodes of similar abdominal pain and nausea and vomiting in the last few months which were diagnosed as viral gastroenteritis vs. Gastroparesis. He was brought back and forth to the ER 2 times, this morning, [place] abd and CT abd/pelvis were unremarkable during last admission. LFTs have been mildly elevated esp alkaline phosphatase. He is being admitted for pain control and further evaluation of his abdominal pain.    Objective: Filed Vitals:   03/23/12 1305 03/23/12 2136 03/24/12 0603 03/24/12 1120  BP: 132/97 118/82 114/77 128/91  Pulse: 81 66  87  Temp: 97.6 F (36.4 C) 98.4 F (36.9 C) 97.4 F (36.3 C)   TempSrc: Oral Oral Oral   Resp: 20 20 18    Height:      Weight:      SpO2: 97% 100% 97%     Intake/Output Summary (Last 24 hours) at 03/24/12 1241 Last data filed at 03/24/12 1100  Gross per 24 hour  Intake 3313.33 ml  Output   4426 ml  Net -1112.67 ml   Filed Weights   03/22/12 1637  Weight: 73.301 kg (161 lb 9.6 oz)    Exam:   General:  WD, overweight, pleasant, AA male, lying in bed.  Has MR. Answers yes to every question I ask.  Cardiovascular: rrr, no m/r/g  Respiratory: cta, no w/c/r  Abdomen: + distention, + periumbilical hernia, soft, min bowel sounds, non tender  Skin:  No rashes, bruises, or lesions  Psych:  Mental Retardation, but pleasant, well groomed, cooperative.  Data Reviewed: Basic Metabolic  Panel:  Recent Labs Lab 03/22/12 0100 03/22/12 0104 03/22/12 1215 03/23/12 0705 03/24/12 0628  NA 144 143 143 141 140  K 3.7 3.6 4.0 3.7 3.7  CL 103 104 105 108 105  CO2 28  --  26 25 24   GLUCOSE 180* 182* 151* 81 86  BUN 17 18 13 9 10   CREATININE 1.59* 1.60* 1.43* 1.37* 1.46*  CALCIUM 9.5  --  9.6 8.6 8.8   Liver Function Tests:  Recent Labs Lab 03/22/12 1215 03/23/12 0705 03/24/12 0628  AST 30 19 18   ALT 35 24 22  ALKPHOS 118* 96 102  BILITOT 0.4 0.4 0.5  PROT 8.1 6.4 6.8  ALBUMIN  4.1 3.2* 3.4*    Recent Labs Lab 03/22/12 0100  LIPASE 91*   CBC:  Recent Labs Lab 03/22/12 0100 03/22/12 0104 03/22/12 1215 03/22/12 1947 03/23/12 0705  WBC 8.6  --  9.8 8.4 6.0  NEUTROABS 5.8  --  8.2*  --   --   HGB 16.1 17.0 15.8 15.3 13.6  HCT 45.5 50.0 44.8 44.3 41.8  MCV 83.5  --  83.7 84.2 85.3  PLT 290  --  302 288 246   CBG:  Recent Labs Lab 03/23/12 1300 03/23/12 1705 03/23/12 2137 03/24/12 0739 03/24/12 1210  GLUCAP 101* 77 174* 89 168*    Recent Results (from the past 240 hour(s))  MRSA PCR SCREENING     Status: Abnormal   Collection Time    03/22/12  7:44 PM      Result Value Range Status   MRSA by PCR POSITIVE (*) NEGATIVE Final   Comment:            The GeneXpert MRSA Assay (FDA     approved for NASAL specimens     only), is one component of a     comprehensive MRSA colonization     surveillance program. It is not     intended to diagnose MRSA     infection nor to guide or     monitor treatment for     MRSA infections.     RESULT CALLED TO, READ BACK BY AND VERIFIED WITH:     T MILAM RN 2233 03/22/12 A BROWNING     Studies: Nm Gastric Emptying  03/23/2012  *RADIOLOGY REPORT*  Clinical data: Nausea, vomiting, abdominal pain  NUCLEAR MEDICINE GASTRIC EMPTYING EXAM:  Radiopharmaceutical:  2 mCi Tc-46m sulfur colloid labeled egg whites  Technique: The patient ingested a standardized meal containing radiolabeled egg whites. Imaging was performed in the anterior projection for 120 minutes. Gastric emptying is calculated from the obtained images.  Findings: Subjectively, borderline delayed emptying of tracer from the stomach is identified at 2 hours. Quantitative analysis however reveals only 4% emptying at 1 hour and 57% emptying at 2 hours. Normal patients demonstrate greater than 70% emptying at 2 hours. Findings are consistent with delayed gastric emptying.  IMPRESSION: Delayed gastric emptying.   Original Report Authenticated By: Ulyses Southward, M.D.      Scheduled Meds: . Chlorhexidine Gluconate Cloth  6 each Topical [id number]  . enoxaparin (LOVENOX) injection  40 mg Subcutaneous Q24H  . feeding supplement  1 Container Oral TID BM  . feeding supplement  1 Container Oral TID BM  . insulin aspart  0-15 Units Subcutaneous TID WC  . loratadine  10 mg Oral Daily  . metoCLOPramide  10 mg Oral TID AC & HS  . metoprolol tartrate  12.5 mg Oral BID  . mupirocin  ointment  1 application Nasal BID  . pantoprazole  40 mg Oral BID  . QUEtiapine  400 mg Oral QHS  . simvastatin  5 mg Oral q1800  . sodium chloride  3 mL Intravenous Q12H  . sucralfate  1 g Oral TID WC & HS  . vitamin B-12  1,000 mcg Oral q morning - 10a   Continuous Infusions:    Active Problems:   Diabetes   Hypertension   Hyperlipidemia   Schizoaffective disorder   GERD (gastroesophageal reflux disease)   Gastroparesis   Stephani Police, PA-C  [facility] Pager [phone]. If 8PM-8AM, please contact night-coverage at [email or web address], password [facility] 03/24/2012, 12:41 PM  LOS: 2 days   Attending Patient seen and examined. Agree with the above assessment and plan. Doing well, no further nausea and vomiting-advance diet as tolerated. Hopefully d/c in am  S Ghimire

## 2012-03-24 NOTE — Care Management Note (Signed)
    Page 1 of 1   03/25/2012     10:49:15 AM   CARE MANAGEMENT NOTE 03/25/2012  Patient:  Shawn Baird,Shawn Baird   Account Number:  [id number]  Date Initiated:  03/24/2012  Documentation initiated by:  Letha Cape  Subjective/Objective Assessment:   dx gastroparesis  admit- from group home.     Action/Plan:   Anticipated DC Date:  03/25/2012   Anticipated DC Plan:  GROUP HOME  In-house referral  Clinical Social Worker      DC Planning Services  CM consult      Choice offered to / List presented to:             Status of service:  Completed, signed off Medicare Important Message given?   (If response is "NO", the following Medicare IM given date fields will be blank) Date Medicare IM given:   Date Additional Medicare IM given:    Discharge Disposition:  GROUP HOME  Per UR Regulation:  Reviewed for med. necessity/level of care/duration of stay  If discussed at Long Length of Stay Meetings, dates discussed:    Comments:  03/25/12 9:29 Letha Cape RN, BSN [phone] patient for dc to group home today, CSW following.  03/24/12 16:27 Letha Cape RN, BSN [phone] patient is from the [facility]. Patient has medication coverage and transportation. CSW following.

## 2012-03-24 NOTE — Clinical Social Work Note (Signed)
Clinical Social Work Department BRIEF PSYCHOSOCIAL ASSESSMENT 03/24/2012  Patient:  Shawn Baird,Shawn Baird     Account Number:  [id number]     Admit date:  03/22/2012  Clinical Social Worker:  Johnsie Cancel  Date/Time:  03/24/2012 12:10 PM  Referred by:  Physician  Date Referred:  03/23/2012 Referred for  Other - See comment   Other Referral:   discharge to group home   Interview type:  Other - See comment Other interview type:   Group Home  Left two voice mail messages with patient's sister (guardian)    PSYCHOSOCIAL DATA Living Status:  FACILITY; [facility] Primary support name:  Shawn Baird Primary support relationship to patient:  SIBLING Degree of support available:   Adequate, PA stated she was at bedside 03/23/12.    CURRENT CONCERNS Current Concerns  Post-Acute Placement   Other Concerns:    SOCIAL WORK ASSESSMENT / PLAN CSW received consult re: patient being from a group home. CSW completed chart review and contacted the patient's legal guardian, his sister, Shawn Baird [phone]). CSW left two voice mail messages requesting call back.    CSW contacted the patient's group home, who stated they would take patient back when he is medically stable. Group Home requested patient be transported via non emergent EMS. CSW informed group home of new dietary needs of patient, and they stated they would accommodate his needs. CSW will continue to follow patient for d/c.   Assessment/plan status:  Information/Referral to [facility] Other assessment/ plan:   Information/referral to community resources:    PATIENT'S/FAMILY'S RESPONSE TO PLAN OF CARE: Group home thanked CSW for assisting with discharge and updating them on discharge responsibilities.    Lia Foyer, LCSWA [facility] Clinical Social Worker Contact #: [phone]

## 2012-03-25 DIAGNOSIS — R109 Unspecified abdominal pain: Secondary | ICD-10-CM

## 2012-03-25 LAB — GLUCOSE, CAPILLARY: Glucose-Capillary: 117 mg/dL — ABNORMAL HIGH (ref 70–99)

## 2012-03-25 MED ORDER — INSULIN ASPART 100 UNIT/ML ~~LOC~~ SOLN: 0.0000 [IU] | Freq: Three times a day (TID) | SUBCUTANEOUS | Status: DC

## 2012-03-25 MED ORDER — PANTOPRAZOLE SODIUM 40 MG PO TBEC: 40.0000 mg | DELAYED_RELEASE_TABLET | Freq: Two times a day (BID) | ORAL | Status: DC

## 2012-03-25 NOTE — Clinical Social Work Note (Signed)
CSW was consulted to complete discharge of patient. Pt to transfer to [facility] today via [facility].  Patient's sister/guardian is aware of d/c. D/C packet complete with chart copy, signed FL2.  CSW signing off as no other CSW needs identified at this time.  Lia Foyer, LCSWA [facility] Clinical Social Worker Contact #: [phone]

## 2012-03-25 NOTE — Discharge Summary (Signed)
Physician Discharge Summary  Shawn Baird ZOX:[id number] DOB: 11/11/1960 DOA: 03/22/2012  PCP: [name], MD  Admit date: 03/22/2012 Discharge date: 03/25/2012  Time spent: 45 minutes  Recommendations for Outpatient Follow-up:  1. Patient requires small low fiber frequent meals. Do to his mental status he will not be able to correctly portion his meals into small portions with frequent meals. He needs assistance with portion he meals and monitoring to ensure he has no vomiting. 2. Continue Reglan 5 mg before meals and at bedtime. If the patient improves significantly Reglan should be discontinued to avoid adverse side effects. 3. He will need followup for diabetes, hypertension (his blood pressure medications have been changed), and low testosterone. Question does he need to remain on testosterone?  Discharge Diagnoses:  Active Problems:   Diabetes   Hypertension   Hyperlipidemia   Schizoaffective disorder   GERD (gastroesophageal reflux disease)   Gastroparesis   Discharge Condition: Stable able to tolerate small portions of a soft low fiber diet.  Diet recommendation: Small portions, low fiber, no salads, no raw vegetables, no skins on his vegetables or fruits.  Filed Weights   03/22/12 1637  Weight: 73.301 kg (161 lb 9.6 oz)    History of present illness:  The patient is a 52 y.o. year-old M with history of T2DM, HTN, HLD, Schizoaffective d/o, tourette's, mental retardation, pancreatitis who has presented to the ER over the last 24 hours 3 times with complaints of abdominal pain that is made worse by eating. Patient is a difficult historian, frequently answering "yes" to questions that are not yes/no questions. He is inconsistent in his answers, however, he does describe that his pain is worse with eating and located in the lower part of his abdomen. He endorses nausea with vomiting, diarrhea, and constipation. He had associated nausea and small amt of vomiting,  nonbilious/nonbloody. He has had three episodes of similar abdominal pain and nausea and vomiting in the last few months which were diagnosed as viral gastroenteritis vs. Gastroparesis. He was brought back and forth to the ER 2 times, this morning, [place] abd and CT abd/pelvis were unremarkable during last admission. LFTs have been mildly elevated esp alkaline phosphatase. He is being admitted for pain control and further evaluation of his abdominal pain.    Hospital Course:  Abdominal pain with nausea, vomiting  At the time of admission his CT scan of his abdomen and pelvis showed distal thickening in the esophagus consistent with esophagitis versus gastroparesis. A gastric emptying scan revealed delayed gastric emptying with 4% at one hour and 57% at 2 hours. His diet was slowly advanced from clears to soft solids. We have avoided any narcotic medication or anticholinergic medications. His potassium remains within normal limits. He was treated with IV Reglan 10 mg a.c. and at bedtime. We will convert him to oral Reglan 5 mg a.c. and at bedtime at the time of discharge. Hopefully he will improve and the Reglan can be discontinued.  Transaminitis  Appears very mild. Resolved on lab work 2/26   Hypokalemia  This was repleted.   HTN/HLD  BP and cholesterol stable. Defer to PCP. He is on metoprolol 12.5 mg twice a day, but his amlodipine has been discontinued. His blood pressures have remained within normal range.  T2DM  Hemoglobin A1c is 6.2. He is on metformin as an outpatient. He has been maintained with sliding scale insulin in house. We will recommend resuming metformin and adding sliding scale insulin as needed when he returns to the  group home.   CKD stage III  Stable creatinine. Avoid nephrotoxins , baseline 1.5   Schizoaffective disorder  Seems stable. Continue seroquel   GERD  May be contributing to current sx. PPI as above   Social: Per his sister, they recently lost their mother a  couple of months ago.   Discharge Exam: Filed Vitals:   03/24/12 0603 03/24/12 1120 03/24/12 2134 03/25/12 0530  BP: 114/77 128/91 124/79 111/79  Pulse:  87 78 78  Temp: 97.4 F (36.3 C)  98.1 F (36.7 C) 97.3 F (36.3 C)  TempSrc: Oral   Oral  Resp: 18  19 12   Height:      Weight:      SpO2: 97%  98% 100%    General: WD, overweight, pleasant, AA male, lying in bed. Has MR. Answers yes to every question I ask. smiling Cardiovascular: rrr, no m/r/g  Respiratory: cta, no w/c/r  Abdomen: + distention, + periumbilical hernia, soft, min bowel sounds, non tender  Skin: No rashes, bruises, or lesions  Psych: Mental Retardation, but pleasant, well groomed, cooperative.  Unable to accurately answer questions.    Discharge Instructions  Discharge Orders   Future Orders Complete By Expires     Diet general  As directed     Comments:      SMALL FREQUENT LOW FIBER MEALS.  6 SMALL MEALS A DAY.  NO SALADS, NO RAW VEGETABLES.  NO SKINS ON THE FRUIT OR VEGETABLES.  LOW FIBER    Increase activity slowly  As directed         Medication List    STOP taking these medications       amLODipine 5 MG tablet  Commonly known as:  NORVASC     famotidine 20 MG tablet  Commonly known as:  PEPCID     sucralfate 1 GM/10ML suspension  Commonly known as:  CARAFATE      TAKE these medications       cholecalciferol 1000 UNITS tablet  Commonly known as:  VITAMIN D  Take 1,000 Units by mouth daily.     DEPO-TESTOSTERONE 200 MG/ML injection  Generic drug:  testosterone cypionate  Inject 100 mg into the muscle every 28 (twenty-eight) days.     insulin aspart 100 UNIT/ML injection  Commonly known as:  novoLOG  Inject 0-15 Units into the skin 3 (three) times daily with meals.     loratadine 10 MG tablet  Commonly known as:  CLARITIN  Take 10 mg by mouth daily.     metFORMIN 500 MG 24 hr tablet  Commonly known as:  GLUCOPHAGE-XR  Take 500 mg by mouth daily with breakfast.      metoCLOPramide 5 MG tablet  Commonly known as:  REGLAN  Take 1 tablet (5 mg total) by mouth 4 (four) times daily -  before meals and at bedtime.     metoprolol tartrate 12.5 mg Tabs  Commonly known as:  LOPRESSOR  Take 0.5 tablets (12.5 mg total) by mouth 2 (two) times daily.     pantoprazole 40 MG tablet  Commonly known as:  PROTONIX  Take 1 tablet (40 mg total) by mouth 2 (two) times daily.     polyethylene glycol packet  Commonly known as:  MIRALAX / GLYCOLAX  Take 17 g by mouth every morning.     pravastatin 20 MG tablet  Commonly known as:  PRAVACHOL  Take 20 mg by mouth at bedtime.     QUEtiapine 400 MG 24 hr  tablet  Commonly known as:  SEROQUEL XR  Take 400 mg by mouth at bedtime.     vitamin B-12 1000 MCG tablet  Commonly known as:  CYANOCOBALAMIN  Take 1,000 mcg by mouth every morning.           Follow-up Information   Follow up with OSEI-BONSU,GEORGE, MD. Schedule an appointment as soon as possible for a visit in 2 weeks.   Contact information:   [address], [address] [place] [address] [phone]        The results of significant diagnostics from this hospitalization (including imaging, microbiology, ancillary and laboratory) are listed below for reference.    Significant Diagnostic Studies: Nm Gastric Emptying  03/23/2012  *RADIOLOGY REPORT*  Clinical data: Nausea, vomiting, abdominal pain  NUCLEAR MEDICINE GASTRIC EMPTYING EXAM:  Radiopharmaceutical:  2 mCi Tc-31m sulfur colloid labeled egg whites  Technique: The patient ingested a standardized meal containing radiolabeled egg whites. Imaging was performed in the anterior projection for 120 minutes. Gastric emptying is calculated from the obtained images.  Findings: Subjectively, borderline delayed emptying of tracer from the stomach is identified at 2 hours. Quantitative analysis however reveals only 4% emptying at 1 hour and 57% emptying at 2 hours. Normal patients demonstrate greater than 70%  emptying at 2 hours. Findings are consistent with delayed gastric emptying.  IMPRESSION: Delayed gastric emptying.   Original Report Authenticated By: Ulyses Southward, M.D.   And    Ct Abdomen Pelvis W Contrast  03/22/2012  *RADIOLOGY REPORT*  Clinical Data: Altered mental status.  Abdominal pain.  CT ABDOMEN AND PELVIS WITH CONTRAST  Technique:  Multidetector CT imaging of the abdomen and pelvis was performed following the standard protocol during bolus administration of intravenous contrast.  Contrast: OMNIPAQUE IOHEXOL 300 MG/ML  SOLN  Comparison: 03/09/2012 from [facility].  Findings: Technically limited study due to motion artifact.  There appears to be infiltration or atelectasis in the lung bases. Suggestion of thickening of the lower esophageal wall which may represent reflux disease.  The liver, spleen, gallbladder, pancreas, adrenal glands, abdominal aorta, and retroperitoneal lymph nodes are unremarkable.  Sub centimeter low attenuation lesions in the kidneys bilaterally suggesting small cysts.  No hydronephrosis or solid mass appreciated.  The stomach, small bowel, and colon are not abnormally distended and no discrete wall thickening is demonstrated.  No free air or free fluid in the abdomen.  Pelvis:  Prostate gland is not enlarged.  Bladder wall is not thickened.  No free or loculated pelvic fluid collections. Scattered diverticula in the sigmoid colon without diverticulitis. The appendix is not identified.  There is a small left inguinal hernia containing fat.  Normal alignment of the lumbar vertebrae. Focal area of nonspecific sclerosis in the right iliac bone is stable since the previous study from 11/23/2008, consistent with benign sclerosis.  IMPRESSION: Infiltration or atelectasis in the lung bases.  Thickening of the distal esophageal wall suggesting possible esophagitis.  No acute process demonstrated in the abdomen or pelvis.  Study is technically limited due to motion  artifact.   Original Report Authenticated By: Burman Nieves, M.D.     Microbiology: Recent Results (from the past 240 hour(s))  MRSA PCR SCREENING     Status: Abnormal   Collection Time    03/22/12  7:44 PM      Result Value Range Status   MRSA by PCR POSITIVE (*) NEGATIVE Final   Comment:  The GeneXpert MRSA Assay (FDA     approved for NASAL specimens     only), is one component of a     comprehensive MRSA colonization     surveillance program. It is not     intended to diagnose MRSA     infection nor to guide or     monitor treatment for     MRSA infections.     RESULT CALLED TO, READ BACK BY AND VERIFIED WITH:     T MILAM RN 2233 03/22/12 A BROWNING     Labs: Basic Metabolic Panel:  Recent Labs Lab 03/22/12 0100 03/22/12 0104 03/22/12 1215 03/23/12 0705 03/24/12 0628  NA 144 143 143 141 140  K 3.7 3.6 4.0 3.7 3.7  CL 103 104 105 108 105  CO2 28  --  26 25 24   GLUCOSE 180* 182* 151* 81 86  BUN 17 18 13 9 10   CREATININE 1.59* 1.60* 1.43* 1.37* 1.46*  CALCIUM 9.5  --  9.6 8.6 8.8   Liver Function Tests:  Recent Labs Lab 03/22/12 1215 03/23/12 0705 03/24/12 0628  AST 30 19 18   ALT 35 24 22  ALKPHOS 118* 96 102  BILITOT 0.4 0.4 0.5  PROT 8.1 6.4 6.8  ALBUMIN 4.1 3.2* 3.4*    Recent Labs Lab 03/22/12 0100  LIPASE 91*   CBC:  Recent Labs Lab 03/22/12 0100 03/22/12 0104 03/22/12 1215 03/22/12 1947 03/23/12 0705  WBC 8.6  --  9.8 8.4 6.0  NEUTROABS 5.8  --  8.2*  --   --   HGB 16.1 17.0 15.8 15.3 13.6  HCT 45.5 50.0 44.8 44.3 41.8  MCV 83.5  --  83.7 84.2 85.3  PLT 290  --  302 288 246     Recent Labs Lab 03/24/12 0739 03/24/12 1210 03/24/12 1757 03/24/12 2132 03/25/12 0742  GLUCAP 89 168* 166* 167* 97       Signed:  YorkTora Kindred, P.A.-C. [phone] [facility] 03/25/2012, 10:27 AM  Attending  -Patient seen and examined, agree with the assessment and plan as outlined above. He is tolerated a  regular diet, no further vomiting for almost 2 days. Stable for discharge.  S

## 2012-03-28 ENCOUNTER — Encounter (HOSPITAL_COMMUNITY): Payer: Self-pay | Admitting: *Deleted

## 2012-03-28 ENCOUNTER — Emergency Department (HOSPITAL_COMMUNITY)
Admission: EM | Admit: 2012-03-28 | Discharge: 2012-03-29 | Disposition: A | Discharge status: Other Acute Inpatient Hospital | Payer: Medicare Other | Attending: Emergency Medicine | Admitting: Emergency Medicine

## 2012-03-28 DIAGNOSIS — Z79899 Other long term (current) drug therapy: Secondary | ICD-10-CM | POA: Insufficient documentation

## 2012-03-28 DIAGNOSIS — Z8719 Personal history of other diseases of the digestive system: Secondary | ICD-10-CM | POA: Insufficient documentation

## 2012-03-28 DIAGNOSIS — Z8589 Personal history of malignant neoplasm of other organs and systems: Secondary | ICD-10-CM | POA: Insufficient documentation

## 2012-03-28 DIAGNOSIS — F70 Mild intellectual disabilities: Secondary | ICD-10-CM | POA: Insufficient documentation

## 2012-03-28 DIAGNOSIS — F411 Generalized anxiety disorder: Secondary | ICD-10-CM | POA: Insufficient documentation

## 2012-03-28 DIAGNOSIS — Z794 Long term (current) use of insulin: Secondary | ICD-10-CM | POA: Insufficient documentation

## 2012-03-28 DIAGNOSIS — E785 Hyperlipidemia, unspecified: Secondary | ICD-10-CM | POA: Insufficient documentation

## 2012-03-28 DIAGNOSIS — F259 Schizoaffective disorder, unspecified: Secondary | ICD-10-CM | POA: Insufficient documentation

## 2012-03-28 DIAGNOSIS — I10 Essential (primary) hypertension: Secondary | ICD-10-CM | POA: Insufficient documentation

## 2012-03-28 DIAGNOSIS — E119 Type 2 diabetes mellitus without complications: Secondary | ICD-10-CM | POA: Insufficient documentation

## 2012-03-28 LAB — CBC WITH DIFFERENTIAL/PLATELET
Basophils Absolute: 0.1 10*3/uL (ref 0.0–0.1)
Eosinophils Relative: 1 % (ref 0–5)
Lymphocytes Relative: 16 % (ref 12–46)
Neutro Abs: 5.6 10*3/uL (ref 1.7–7.7)
Neutrophils Relative %: 74 % (ref 43–77)
Platelets: 340 10*3/uL (ref 150–400)
RDW: 14.5 % (ref 11.5–15.5)
WBC: 7.6 10*3/uL (ref 4.0–10.5)

## 2012-03-28 LAB — BASIC METABOLIC PANEL
CO2: 23 mEq/L (ref 19–32)
Calcium: 9.6 mg/dL (ref 8.4–10.5)
Chloride: 103 mEq/L (ref 96–112)
Potassium: 3.5 mEq/L (ref 3.5–5.1)
Sodium: 139 mEq/L (ref 135–145)

## 2012-03-28 LAB — RAPID URINE DRUG SCREEN, HOSP PERFORMED
Amphetamines: NOT DETECTED
Opiates: NOT DETECTED

## 2012-03-28 LAB — ETHANOL: Alcohol, Ethyl (B): <11 mg/dL (ref 0–11)

## 2012-03-28 MED ORDER — ZOLPIDEM TARTRATE 5 MG PO TABS
5.0000 mg | ORAL_TABLET | Freq: Every evening | ORAL | Status: DC | PRN
  Administered 2012-03-29: 5 mg via ORAL
  Filled 2012-03-28: qty 1

## 2012-03-28 MED ORDER — METFORMIN HCL ER 500 MG PO TB24
500.0000 mg | ORAL_TABLET | Freq: Every day | ORAL | Status: DC
  Administered 2012-03-29: 500 mg via ORAL
  Filled 2012-03-28 (×2): qty 1

## 2012-03-28 MED ORDER — ACETAMINOPHEN 325 MG PO TABS: 650.0000 mg | ORAL_TABLET | ORAL | Status: DC | PRN

## 2012-03-28 MED ORDER — METOCLOPRAMIDE HCL 10 MG PO TABS
5.0000 mg | ORAL_TABLET | Freq: Three times a day (TID) | ORAL | Status: DC
  Administered 2012-03-29 (×3): 5 mg via ORAL
  Filled 2012-03-28 (×3): qty 1

## 2012-03-28 MED ORDER — ONDANSETRON HCL 4 MG PO TABS: 4.0000 mg | ORAL_TABLET | Freq: Three times a day (TID) | ORAL | Status: DC | PRN

## 2012-03-28 MED ORDER — METOPROLOL TARTRATE 25 MG PO TABS
12.5000 mg | ORAL_TABLET | Freq: Two times a day (BID) | ORAL | Status: DC
  Administered 2012-03-28 – 2012-03-29 (×2): 12.5 mg via ORAL
  Filled 2012-03-28 (×2): qty 1

## 2012-03-28 MED ORDER — LORAZEPAM 1 MG PO TABS
1.0000 mg | ORAL_TABLET | Freq: Three times a day (TID) | ORAL | Status: DC | PRN
  Administered 2012-03-29: 1 mg via ORAL
  Filled 2012-03-28: qty 1

## 2012-03-28 MED ORDER — PANTOPRAZOLE SODIUM 40 MG PO TBEC
40.0000 mg | DELAYED_RELEASE_TABLET | Freq: Two times a day (BID) | ORAL | Status: DC
Start: 2012-03-28 — End: 2012-03-29
  Administered 2012-03-28 – 2012-03-29 (×2): 40 mg via ORAL
  Filled 2012-03-28 (×2): qty 1

## 2012-03-28 MED ORDER — INSULIN ASPART 100 UNIT/ML ~~LOC~~ SOLN: 0.0000 [IU] | Freq: Three times a day (TID) | SUBCUTANEOUS | Status: DC

## 2012-03-28 MED ORDER — QUETIAPINE FUMARATE ER 400 MG PO TB24
400.0000 mg | ORAL_TABLET | Freq: Every day | ORAL | Status: DC
  Administered 2012-03-28: 400 mg via ORAL
  Filled 2012-03-28 (×2): qty 1

## 2012-03-28 MED ORDER — SIMVASTATIN 10 MG PO TABS
10.0000 mg | ORAL_TABLET | Freq: Every day | ORAL | Status: DC
  Administered 2012-03-29: 10 mg via ORAL
  Filled 2012-03-28: qty 1

## 2012-03-28 NOTE — ED Provider Notes (Signed)
History    This chart was scribed for non-physician practitioner working with Lyanne Co, MD by Charolett Bumpers, ED Scribe. This patient was seen in room WTR4/WLPT4 and the patient's care was started at 22.   CSN: [id number]  Arrival date & time 03/28/12  2221   None     Chief Complaint  Patient presents with  . Medical Clearance   Level V Caveat: Psychological disorder.   The history is provided by the patient and the police. The history is limited by the condition of the patient. No language interpreter was used.  Shawn Baird is a 52 y.o. male who has a h/o anxiety, schizoaffective disorder and mental retardation presents to the Emergency Department for medical clearance. He lives in a [facility] care home. He was brought in by [facility] with IVC papers after making threats to kill himself and threats to the facility staff members. [employer] states that the pt was walking in the middle of the street when they found him after leaving the group home. She reports SI and HI. She states the pt said he wanted to blow his brains out and hers and has been rambling. They state upon picking him up, he was aggressive but has since calmed down. Pt is singing and pleasant in ED.    Past Medical History  Diagnosis Date  . Diabetes mellitus   . Anxiety   . Hypertension   . Hyperlipemia   . Mental disorder   . Schizoaffective disorder   . Impulsive control disorder  . Pancreatitis   . Otitis media   . Cancer   . HOH (hard of hearing)   . Tourette's disease   . Mental retardation     Past Surgical History  Procedure Laterality Date  . 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 High blood pressure Sister   . High Cholesterol Sister   . Heart disease Mother   . Stroke Mother     History  Substance Use Topics  . Smoking status: Never Smoker   . Smokeless tobacco: Never Used  . Alcohol Use: No      Review of  Systems  Unable to perform ROS: Psychiatric disorder    Allergies  Review of patient's allergies indicates no known allergies.  Home Medications   Current Outpatient Rx  Name  Route  Sig  Dispense  Refill  . cholecalciferol (VITAMIN D) 1000 UNITS tablet   Oral   Take 1,000 Units by mouth daily.         . insulin aspart (NOVOLOG) 100 UNIT/ML injection   Subcutaneous   Inject 0-15 Units into the skin 3 (three) times daily with meals.   1 vial   0   . loratadine (CLARITIN) 10 MG tablet   Oral   Take 10 mg by mouth daily.         . metFORMIN (GLUCOPHAGE-XR) 500 MG 24 hr tablet   Oral   Take 500 mg by mouth daily with breakfast.         . metoCLOPramide (REGLAN) 5 MG tablet   Oral   Take 1 tablet (5 mg total) by mouth 4 (four) times daily -  before meals and at bedtime.   120 tablet   0   . metoprolol tartrate (LOPRESSOR) 12.5 mg TABS   Oral   Take 0.5 tablets (12.5 mg total)  by mouth 2 (two) times daily.   30 tablet   0   . pantoprazole (PROTONIX) 40 MG tablet   Oral   Take 1 tablet (40 mg total) by mouth 2 (two) times daily.   30 tablet   0   . polyethylene glycol (MIRALAX / GLYCOLAX) packet   Oral   Take 17 g by mouth every morning.         . pravastatin (PRAVACHOL) 20 MG tablet   Oral   Take 20 mg by mouth at bedtime.          Marland Kitchen QUEtiapine (SEROQUEL XR) 400 MG 24 hr tablet   Oral   Take 400 mg by mouth at bedtime.         Marland Kitchen testosterone cypionate (DEPO-TESTOSTERONE) 200 MG/ML injection   Intramuscular   Inject 100 mg into the muscle every 28 (twenty-eight) days.          . vitamin B-12 (CYANOCOBALAMIN) 1000 MCG tablet   Oral   Take 1,000 mcg by mouth every morning.            BP 165/107  Pulse 89  Temp(Src) 98.6 F (37 C) (Oral)  Resp 19  SpO2 100%  Physical Exam  Nursing note and vitals reviewed. Constitutional: He is oriented to person, place, and time. He appears well-developed and well-nourished. No distress.  HENT:   Head: Normocephalic and atraumatic.  Eyes: Conjunctivae and EOM are normal.  Neck: Neck supple. No tracheal deviation present.  Cardiovascular: Normal rate, regular rhythm and normal heart sounds.   Pulmonary/Chest: Effort normal and breath sounds normal. No respiratory distress. He has no wheezes.  Abdominal: Soft. There is no tenderness.  Musculoskeletal: Normal range of motion.  Neurological: He is alert and oriented to person, place, and time.  Skin: Skin is warm and dry.  Psychiatric: He has a normal mood and affect. His behavior is normal.    ED Course  Procedures (including critical care time)  DIAGNOSTIC STUDIES: Oxygen Saturation is 100% on room air, normal by my interpretation.    COORDINATION OF CARE:  22:32-Preformed physical exam. Will order labs for medical clearance prior to behavioral health evaluation.    Labs Reviewed - No data to display No results found.   No diagnosis found.  1. Mental Retardation 2. Combative Behavior 3.  MDM  Patient in ED under IVC with complaint of combative behavior, threatening statements/gestures toward staff of group home, runaway found in traffic. [facility] to evaluate for placement.   I personally performed the services described in this documentation, which was scribed in my presence. The recorded information has been reviewed and is accurate.        Arnoldo Hooker, PA-C 03/28/12 2259

## 2012-03-28 NOTE — ED Notes (Signed)
Pt brought in via [employer] from [facility], pt group home facility. Pt under IVC for making threats to facility staff members and has threatened to kill himself, pt walked away from group home tonight w/ no coat on in a snow storm and was observed walking in the middle of a major roadway, pt has been rambling unintelligible speech, pacing back and forth making aggressive gestures towards staff and other residents. On arrival pt pleasant w/ staff - pt w/ hx of mental retardation and is difficult to obtain accurate history. Pt singing loudly while in triage.

## 2012-03-28 NOTE — ED Provider Notes (Signed)
Medical screening examination/treatment/procedure(s) were performed by non-physician practitioner and as supervising physician I was immediately available for consultation/collaboration.   Kevin M Campos, MD 03/28/12 2302 

## 2012-03-29 LAB — GLUCOSE, CAPILLARY
Glucose-Capillary: 120 mg/dL — ABNORMAL HIGH (ref 70–99)
Glucose-Capillary: 158 mg/dL — ABNORMAL HIGH (ref 70–99)
Glucose-Capillary: 72 mg/dL (ref 70–99)

## 2012-03-29 NOTE — ED Notes (Signed)
Patient sitting on edge of bed sleeping. Patient encourage to get in bed before he falls. Patient got in bed and start complaining about a resident name [name] at his rest home. He states" [name] always get the remote control and turns the TV. HE states I am tired of Ricky I'm going to get all my clothes out of he closet and move"

## 2012-03-29 NOTE — BHH Counselor (Signed)
Contacted [facility] [phone] and patient will not be reviewed unless a psychological is available and faxed to their facility. Writer unable to provide this document as patients group home manager Larkin Ina sts that their is no documentation stating that patient has a mental retardation diagnosis. Sts that she was told this information by his sister-Patricia Torregrossa [phone] who is also patients court appointed guardian   [facility] [phone]; beds are available. Faxed referral information to the facility for their staff to review.   Contacted [facility] [phone] and all of their units are at capacity per [facility].   [facility] takes [facility] children only (no adults).

## 2012-03-29 NOTE — ED Notes (Signed)
Patient states that "I hate it at the the home. They think that I am a garbage man." They don't make anybody else take out the trash. Patient states "I am going to pack my clothes and leave".

## 2012-03-29 NOTE — BHH Counselor (Addendum)
Group home [facility] [phone] calls stating that patient ran away from the group home last night with no shoes or clothes. Sts that police were called to help locate the patient. Sts that patient was found by [employer] and returned back to group home. Patient returned to the group home irritated, angry, and yelling. Sts that patient also started to throw objects around in the group home. Patient also took his hearing aids out of his ear and threw them at the group home supervisor. Patient then threatened to kill himself. Patient has no history of suicide attempts and/or gestures however has threatened to hurt himself in the past.  Patient verbalized to group home supervisor  that he hated living in the group home and made references about his family mistreating him. Dara sts that patient has a history of outburst and will occasionally become upset when told to do things such as chores. Patient has also become increasingly irritated with another resident. Dara sts that their is a new resident "[name]" that taunts [place] and the other residents.  Patient has received inpatient treatment at [facility] in the past. Patient was at the facility Oct 2011 and stated in the facility x1 month.   Group home manager Larkin Ina sts that their is no documentation stating that patient has a mental retardation diagnosis. Sts that she was told this information by his sister-Patricia Lizak [phone] who is also patients court appointed guardian.

## 2012-03-29 NOTE — ED Provider Notes (Addendum)
Pt resting, nad. Vitals normal. Will get telepsych eval this morning.     Suzi Roots, MD 03/29/12 [phone]  Act team indicates pt accepted to [facility], Dr Rusty Aus, bed ready.   Suzi Roots, MD 03/29/12 [phone]

## 2012-03-29 NOTE — BHH Counselor (Signed)
Patient accepted to [facility]. The accepting physician is Dr. Rusty Aus. Call report [phone] or [phone]. Patient is under IVC and pending transport via sheriff.

## 2012-03-29 NOTE — BH Assessment (Signed)
Assessment Note   Shawn Baird is an 52 y.o. male. Patient presents to [facility] with h/o anxiety, schizoaffective disorder and mental retardation.  He lives at [facility]. Per ED notes, he was brought in by [facility] with IVC papers after making threats to kill himself and threats to the facility staff members. [employer] states that the pt was walking in the middle of the street when they found him after leaving the group home. Patient returned to the group home stating that he wanted to blow his brains out and also the group home staff. He was rambling, angry, and extremely irritated. Per [employer] upon picking him up, he was aggressive but has since arriving to [facility] he has calmed down.   Patient now denies SI, HI, and AVH's. No alcohol or drug use noted.  Sts that he is simply "Tired of people making him take out the trash"; "I am not a trash man", and "People don't respect me at that group home". Patient then speaks of another resident "Shawn Baird" stating they do not get along. Patient says that he will not return to the group home. Pt explains that he has plans to clean out his closet in the group home once discharged and staying "somewhere". Patient unable to state specifically where he would live if he refuses to live in the group home.   Collateral Information: Group home [facility] [phone] calls stating that patient ran away from the group home last night with no shoes or clothes. Sts that police were called to help locate the patient. Sts that patient was found by [employer] and returned back to group home. Patient returned to the group home irritated, angry, and yelling. Sts that patient also started to throw objects around in the group home. Patient also took his hearing aids out of his ear and threw them at the group home supervisor. Patient then threatened to kill himself. Patient has no history of suicide attempts and/or gestures however has threatened to hurt himself in the past. Patient  verbalized to group home supervisor that he hated living in the group home and made references about his family mistreating him. Shawn Baird sts that patient has a history of outburst and will occasionally become upset when told to do things such as chores. Patient has also become increasingly irritated with another resident. Shawn Baird sts that their is a new resident "Shawn Baird" that taunts [place] and the other residents.   Per Shawn Baird has received inpatient treatment at [facility] in the past. Patient was at the facility Oct 2011 and stated in the facility x1 month.   Group home manager Shawn Baird sts that their is no documentation stating that patient has a mental retardation diagnosis. Sts that she was told this information by his sister-Shawn Baird [phone] who is also patients court appointed guardian  Axis I: Mood Disorder NOS Axis II: Mental retardation, severity unknown Axis III:  Past Medical History  Diagnosis Date  . Diabetes mellitus   . Anxiety   . Hypertension   . Hyperlipemia   . Mental disorder   . Schizoaffective disorder   . Impulsive control disorder  . Pancreatitis   . Otitis media   . Cancer   . HOH (hard of hearing)   . Tourette's disease   . Mental retardation    Axis IV: housing problems, occupational problems, problems related to social environment and problems with primary support group Axis V: 51-60 moderate symptoms  Past Medical History:  Past Medical History  Diagnosis Date  . Diabetes mellitus   . Anxiety   . Hypertension   . Hyperlipemia   . Mental disorder   . Schizoaffective disorder   . Impulsive control disorder  . Pancreatitis   . Otitis media   . Cancer   . HOH (hard of hearing)   . Tourette's disease   . Mental retardation     Past Surgical History  Procedure Laterality Date  . 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 High blood pressure  Sister   . High Cholesterol Sister   . Heart disease Mother   . Stroke Mother     Social History:  reports that he has never smoked. He has never used smokeless tobacco. He reports that he does not drink alcohol or use illicit drugs.  Additional Social History:     CIWA: CIWA-Ar BP: 156/88 mmHg Pulse Rate: 92 COWS:    Allergies: No Known Allergies  Home Medications:  (Not in a hospital admission)  OB/GYN Status:  No LMP for male patient.  General Assessment Data Location of Assessment: [facility] ED Living Arrangements: Other (Comment) (Group Home -"[facility]") Can pt return to current living arrangement?: Yes Admission Status: Voluntary Is patient capable of signing voluntary admission?: No Transfer from: Acute Hospital Referral Source: Self/Family/Friend  Education Status Is patient currently in school?: No  Risk to self Suicidal Ideation: No Suicidal Intent: No Is patient at risk for suicide?: No Suicidal Plan?: No Specify Current Suicidal Plan:  (n/a) Access to Means: No What has been your use of drugs/alcohol within the last 12 months?:  (No alcohol or drug use) Previous Attempts/Gestures: No (verbal threats only) How many times?:  (0) Other Self Harm Risks:  (n/a) Triggers for Past Attempts: Unpredictable Intentional Self Injurious Behavior: None Family Suicide History: Yes (Dad-alcoholic) Recent stressful life event(s): Other (Comment) (upset with another resident "Ricky") Persecutory voices/beliefs?: No Depression: Yes Depression Symptoms: Feeling angry/irritable Substance abuse history and/or treatment for substance abuse?: No Suicide prevention information given to non-admitted patients: Not applicable  Risk to Others Homicidal Ideation: No Thoughts of Harm to Others: No Current Homicidal Intent: No Current Homicidal Plan: No Access to Homicidal Means: No Identified Victim:  (n/a) History of harm to others?: No Assessment of Violence:  None Noted Violent Behavior Description:  (pt calm/cooperative her at [facility]; threw chair at group home s) Does patient have access to weapons?: No Criminal Charges Pending?: No Does patient have a court date: No  Psychosis Hallucinations: None noted Delusions: None noted  Mental Status Report Appear/Hygiene: Disheveled Eye Contact: Fair Motor Activity: Freedom of movement Speech: Logical/coherent;Incoherent;Tangential Level of Consciousness: Alert Mood: Anxious Affect: Blunted Anxiety Level: None Thought Processes: Relevant Judgement: Impaired Orientation: Person;Place Obsessive Compulsive Thoughts/Behaviors: None  Cognitive Functioning Concentration: Decreased Memory: Recent Intact;Remote Impaired IQ: Average Insight: Fair Impulse Control: Poor Appetite: Poor (patients MD changed his diet; pt eats small amts) Weight Loss:  (none reported; currently 161 pounds) Weight Gain:  (none reported; currently 161 pounds) Sleep: No Change Total Hours of Sleep:  (8 or more hours) Vegetative Symptoms: None  ADLScreening [facility] Assessment Services) Patient's cognitive ability adequate to safely complete daily activities?: Yes Patient able to express need for assistance with ADLs?: Yes Independently performs ADLs?: Yes (appropriate for developmental age)  Abuse/Neglect [facility]) Physical Abuse: Denies Verbal Abuse: Denies Sexual Abuse: Denies  Prior  Inpatient Therapy Prior Inpatient Therapy: Yes Prior Therapy Dates:  [facility] 2011; possbibly [facility] ) Prior Therapy Facilty/Provider(s):  ([facility] ) Reason for Treatment:  ("behavioral problems")  Prior Outpatient Therapy Prior Outpatient Therapy: Yes Prior Therapy Dates:  (currently ) Prior Therapy Facilty/Provider(s):  [facility]) Reason for Treatment:  (medication mangement)  ADL Screening (condition at time of admission) Patient's cognitive ability adequate to safely complete  daily activities?: Yes Patient able to express need for assistance with ADLs?: Yes Independently performs ADLs?: Yes (appropriate for developmental age)       Abuse/Neglect Assessment (Assessment to be complete while patient is alone) Physical Abuse: Denies Verbal Abuse: Denies Sexual Abuse: Denies Values / Beliefs Spiritual Requests During Hospitalization: None        Additional Information 1:1 In Past 12 Months?: No CIRT Risk: No Elopement Risk: No Does patient have medical clearance?: Yes     Disposition:  Disposition Initial Assessment Completed: Yes Disposition of Patient: Inpatient treatment program Type of inpatient treatment program: Adult Other disposition(s): Information only Patient referred to:  Ramond Marrow or Abran Cantor)  On Site Evaluation by:   Reviewed with Physician:     Melynda Ripple [facility] 03/29/2012 11:40 AM

## 2012-04-22 ENCOUNTER — Emergency Department (HOSPITAL_COMMUNITY)
Admission: EM | Admit: 2012-04-22 | Discharge: 2012-04-24 | Disposition: A | Discharge status: Home / Self Care | Payer: Medicare Other | Attending: Emergency Medicine | Admitting: Emergency Medicine

## 2012-04-22 ENCOUNTER — Encounter (HOSPITAL_COMMUNITY): Payer: Self-pay | Admitting: Emergency Medicine

## 2012-04-22 DIAGNOSIS — E119 Type 2 diabetes mellitus without complications: Secondary | ICD-10-CM | POA: Insufficient documentation

## 2012-04-22 DIAGNOSIS — R4689 Other symptoms and signs involving appearance and behavior: Secondary | ICD-10-CM

## 2012-04-22 DIAGNOSIS — F79 Unspecified intellectual disabilities: Secondary | ICD-10-CM

## 2012-04-22 DIAGNOSIS — Z79899 Other long term (current) drug therapy: Secondary | ICD-10-CM | POA: Insufficient documentation

## 2012-04-22 DIAGNOSIS — Z8589 Personal history of malignant neoplasm of other organs and systems: Secondary | ICD-10-CM | POA: Insufficient documentation

## 2012-04-22 DIAGNOSIS — Z7982 Long term (current) use of aspirin: Secondary | ICD-10-CM | POA: Insufficient documentation

## 2012-04-22 DIAGNOSIS — R45851 Suicidal ideations: Secondary | ICD-10-CM | POA: Insufficient documentation

## 2012-04-22 DIAGNOSIS — Z859 Personal history of malignant neoplasm, unspecified: Secondary | ICD-10-CM | POA: Insufficient documentation

## 2012-04-22 DIAGNOSIS — F411 Generalized anxiety disorder: Secondary | ICD-10-CM | POA: Insufficient documentation

## 2012-04-22 DIAGNOSIS — Z794 Long term (current) use of insulin: Secondary | ICD-10-CM | POA: Insufficient documentation

## 2012-04-22 DIAGNOSIS — F259 Schizoaffective disorder, unspecified: Secondary | ICD-10-CM | POA: Insufficient documentation

## 2012-04-22 DIAGNOSIS — F911 Conduct disorder, childhood-onset type: Secondary | ICD-10-CM | POA: Insufficient documentation

## 2012-04-22 DIAGNOSIS — F489 Nonpsychotic mental disorder, unspecified: Secondary | ICD-10-CM | POA: Insufficient documentation

## 2012-04-22 DIAGNOSIS — Z8719 Personal history of other diseases of the digestive system: Secondary | ICD-10-CM | POA: Insufficient documentation

## 2012-04-22 DIAGNOSIS — Z8669 Personal history of other diseases of the nervous system and sense organs: Secondary | ICD-10-CM | POA: Insufficient documentation

## 2012-04-22 DIAGNOSIS — I10 Essential (primary) hypertension: Secondary | ICD-10-CM | POA: Insufficient documentation

## 2012-04-22 DIAGNOSIS — E785 Hyperlipidemia, unspecified: Secondary | ICD-10-CM | POA: Insufficient documentation

## 2012-04-22 DIAGNOSIS — H9190 Unspecified hearing loss, unspecified ear: Secondary | ICD-10-CM | POA: Insufficient documentation

## 2012-04-22 LAB — CBC
HCT: 45.7 % (ref 39.0–52.0)
MCH: 28.5 pg (ref 26.0–34.0)
MCV: 84.2 fL (ref 78.0–100.0)
Platelets: 266 10*3/uL (ref 150–400)
RBC: 5.43 MIL/uL (ref 4.22–5.81)
WBC: 8.4 10*3/uL (ref 4.0–10.5)

## 2012-04-22 LAB — COMPREHENSIVE METABOLIC PANEL
BUN: 21 mg/dL (ref 6–23)
CO2: 22 mEq/L (ref 19–32)
Calcium: 9.4 mg/dL (ref 8.4–10.5)
Chloride: 100 mEq/L (ref 96–112)
Creatinine, Ser: 1.7 mg/dL — ABNORMAL HIGH (ref 0.50–1.35)
GFR calc Af Amer: 52 mL/min — ABNORMAL LOW (ref >90)
GFR calc non Af Amer: 45 mL/min — ABNORMAL LOW (ref >90)
Total Bilirubin: 0.5 mg/dL (ref 0.3–1.2)

## 2012-04-22 LAB — ETHANOL: Alcohol, Ethyl (B): <11 mg/dL (ref 0–11)

## 2012-04-22 LAB — SALICYLATE LEVEL: Salicylate Lvl: <2 mg/dL — ABNORMAL LOW (ref 2.8–20.0)

## 2012-04-22 MED ORDER — ZIPRASIDONE MESYLATE 20 MG IM SOLR
20.0000 mg | Freq: Once | INTRAMUSCULAR | Status: AC
  Administered 2012-04-22: 20 mg via INTRAMUSCULAR
  Filled 2012-04-22: qty 20

## 2012-04-22 MED ORDER — DICYCLOMINE HCL 20 MG PO TABS
20.0000 mg | ORAL_TABLET | Freq: Two times a day (BID) | ORAL | Status: DC
  Administered 2012-04-23 – 2012-04-24 (×3): 20 mg via ORAL
  Filled 2012-04-22 (×4): qty 1

## 2012-04-22 MED ORDER — FAMOTIDINE 20 MG PO TABS
20.0000 mg | ORAL_TABLET | Freq: Two times a day (BID) | ORAL | Status: DC
  Administered 2012-04-23 – 2012-04-24 (×3): 20 mg via ORAL
  Filled 2012-04-22 (×4): qty 1

## 2012-04-22 MED ORDER — METOPROLOL TARTRATE 25 MG PO TABS
12.5000 mg | ORAL_TABLET | Freq: Two times a day (BID) | ORAL | Status: DC
  Administered 2012-04-23 – 2012-04-24 (×3): 12.5 mg via ORAL
  Filled 2012-04-22 (×4): qty 1

## 2012-04-22 MED ORDER — NICOTINE 21 MG/24HR TD PT24: 21.0000 mg | MEDICATED_PATCH | Freq: Every day | TRANSDERMAL | Status: DC | PRN

## 2012-04-22 MED ORDER — ASPIRIN EC 81 MG PO TBEC
81.0000 mg | DELAYED_RELEASE_TABLET | Freq: Every day | ORAL | Status: DC
  Administered 2012-04-22 – 2012-04-24 (×3): 81 mg via ORAL
  Filled 2012-04-22 (×3): qty 1

## 2012-04-22 MED ORDER — QUETIAPINE FUMARATE 300 MG PO TABS
600.0000 mg | ORAL_TABLET | Freq: Every day | ORAL | Status: DC
  Administered 2012-04-23: 600 mg via ORAL
  Filled 2012-04-22 (×2): qty 2

## 2012-04-22 MED ORDER — IBUPROFEN 600 MG PO TABS
600.0000 mg | ORAL_TABLET | Freq: Three times a day (TID) | ORAL | Status: DC | PRN
  Administered 2012-04-23 – 2012-04-24 (×2): 600 mg via ORAL
  Filled 2012-04-22 (×2): qty 1

## 2012-04-22 MED ORDER — ONDANSETRON HCL 4 MG PO TABS
4.0000 mg | ORAL_TABLET | Freq: Three times a day (TID) | ORAL | Status: DC | PRN
  Administered 2012-04-23: 4 mg via ORAL
  Filled 2012-04-22: qty 1

## 2012-04-22 MED ORDER — ZOLPIDEM TARTRATE 5 MG PO TABS
5.0000 mg | ORAL_TABLET | Freq: Every evening | ORAL | Status: DC | PRN
  Administered 2012-04-23: 5 mg via ORAL
  Filled 2012-04-22 (×2): qty 1

## 2012-04-22 MED ORDER — SIMVASTATIN 10 MG PO TABS
10.0000 mg | ORAL_TABLET | Freq: Every day | ORAL | Status: DC
  Administered 2012-04-23: 10 mg via ORAL
  Filled 2012-04-22 (×2): qty 1

## 2012-04-22 MED ORDER — QUETIAPINE FUMARATE 100 MG PO TABS
100.0000 mg | ORAL_TABLET | Freq: Every day | ORAL | Status: DC
  Administered 2012-04-23: 100 mg via ORAL
  Filled 2012-04-22 (×2): qty 1

## 2012-04-22 MED ORDER — METFORMIN HCL 500 MG PO TABS: 500.0000 mg | ORAL_TABLET | Freq: Every day | ORAL | Status: DC

## 2012-04-22 MED ORDER — METOCLOPRAMIDE HCL 10 MG PO TABS
5.0000 mg | ORAL_TABLET | Freq: Three times a day (TID) | ORAL | Status: DC
  Administered 2012-04-22 – 2012-04-24 (×7): 5 mg via ORAL
  Filled 2012-04-22: qty 1
  Filled 2012-04-22: qty 2
  Filled 2012-04-22 (×5): qty 1

## 2012-04-22 MED ORDER — BUPROPION HCL ER (XL) 300 MG PO TB24
300.0000 mg | ORAL_TABLET | Freq: Every day | ORAL | Status: DC
  Administered 2012-04-22 – 2012-04-24 (×3): 300 mg via ORAL
  Filled 2012-04-22 (×3): qty 1

## 2012-04-22 MED ORDER — VITAMIN B-12 1000 MCG PO TABS
1000.0000 ug | ORAL_TABLET | Freq: Every morning | ORAL | Status: DC
Start: 2012-04-23 — End: 2012-04-23
  Filled 2012-04-22: qty 1

## 2012-04-22 MED ORDER — INSULIN ASPART 100 UNIT/ML ~~LOC~~ SOLN
0.0000 [IU] | Freq: Three times a day (TID) | SUBCUTANEOUS | Status: DC
  Administered 2012-04-23: 5 [IU] via SUBCUTANEOUS
  Administered 2012-04-23: 2 [IU] via SUBCUTANEOUS
  Filled 2012-04-22: qty 2
  Filled 2012-04-22: qty 5

## 2012-04-22 MED ORDER — PANTOPRAZOLE SODIUM 40 MG PO TBEC
40.0000 mg | DELAYED_RELEASE_TABLET | Freq: Every day | ORAL | Status: DC
  Administered 2012-04-22 – 2012-04-24 (×3): 40 mg via ORAL
  Filled 2012-04-22 (×3): qty 1

## 2012-04-22 MED ORDER — AMLODIPINE BESYLATE 5 MG PO TABS
5.0000 mg | ORAL_TABLET | Freq: Every day | ORAL | Status: DC
  Administered 2012-04-22 – 2012-04-24 (×3): 5 mg via ORAL
  Filled 2012-04-22 (×3): qty 1

## 2012-04-22 MED ORDER — INSULIN REGULAR HUMAN 100 UNIT/ML IJ SOLN: 2.0000 [IU] | Freq: Three times a day (TID) | INTRAMUSCULAR | Status: DC

## 2012-04-22 MED ORDER — VITAMIN D3 25 MCG (1000 UNIT) PO TABS
1000.0000 [IU] | ORAL_TABLET | Freq: Every day | ORAL | Status: DC
  Administered 2012-04-22 – 2012-04-24 (×3): 1000 [IU] via ORAL
  Filled 2012-04-22 (×3): qty 1

## 2012-04-22 MED ORDER — ACETAMINOPHEN 325 MG PO TABS
650.0000 mg | ORAL_TABLET | ORAL | Status: DC | PRN
  Administered 2012-04-23: 650 mg via ORAL
  Filled 2012-04-22: qty 2

## 2012-04-22 MED ORDER — LORAZEPAM 2 MG/ML IJ SOLN
2.0000 mg | Freq: Once | INTRAMUSCULAR | Status: AC
  Administered 2012-04-22: 2 mg via INTRAMUSCULAR
  Filled 2012-04-22: qty 1

## 2012-04-22 MED ORDER — LORATADINE 10 MG PO TABS
10.0000 mg | ORAL_TABLET | Freq: Every day | ORAL | Status: DC
  Administered 2012-04-22 – 2012-04-24 (×3): 10 mg via ORAL
  Filled 2012-04-22 (×3): qty 1

## 2012-04-22 NOTE — ED Provider Notes (Signed)
Patient has become quite agitated. He has a history of acute psychosis. Patient has been abusive and threatening to staff. He was given Geodon 20 mg IM without improvement. I did come and reevaluate him. He is still quite agitated. Patient administered 2 mg of Ativan IM and follow along his progress.  Gilda Crease, MD 04/22/12 [phone]
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
## 2012-04-22 NOTE — ED Notes (Signed)
Report called to [facility].
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
## 2012-04-22 NOTE — ED Notes (Signed)
Pt beligerent, cursing, stating give him a gun so he can kill himself.  Pulling at respiratory cabinet.  Pt threw drink in room.  [employer] & security at bedside to assist with pt.  Geodon given IM.

## 2012-04-22 NOTE — ED Notes (Signed)
Patient brought in by [employer] from [facility] with elevated B/P and psychosis.  [employer] reports patient was verbally aggressive and making threats.

## 2012-04-22 NOTE — ED Provider Notes (Signed)
Shawn Baird is a 52 y.o. male  who is here with police, under involuntary commitment, signed by a person at his group home; for evaluation of abnormal behavior, including wandering, aggression, and confusion. He is reportedly refusing to eat and take his medicines, for 2 days. Had a similar episode on 03/28/12; a stabilized in the emergency department and sent to an outlying psychiatric facility.  In the emergency department, he is calm and stable. He is communicative. He states that he is here because he was arguing with somebody about money. He seems to have poor insight.  Exam: Alert, cooperative. HEENT normocephalic, atraumatic. Pupils equal, round, reactive to light and accommodation. Extraocular muscles are intact. Ears, nose, and mouth are normal externally. Neck supple and nontender. Normal range of motion heart regular rate and rhythm. No murmur, heave or thrill. He has intact pulses distally. Lungs clear to auscultation. Abdomen soft, nontender, no masses. No hepatosplenomegaly. Extremities no range of motion no deformities. Neurologic alert,  behavior and affect is appropriate. Psychiatric she is anxious. Skin there is no evident rash   Nursing Notes Reviewed/ Care Coordinated Applicable Imaging Reviewed Interpretation of Laboratory Data incorporated into ED treatment  Medications  ziprasidone (GEODON) injection 20 mg (20 mg Intramuscular Given 04/22/12 1422)    Will assess, with a psychiatric consultation, and have ACT scan the patient.  Medical screening examination/treatment/procedure(s) were conducted as a shared visit with non-physician practitioner(s) and myself.  I personally evaluated the patient during the encounter  Flint Melter, MD 04/23/12 [phone]

## 2012-04-22 NOTE — Progress Notes (Signed)
PHARMACIST - PHYSICIAN COMMUNICATION DR:  Effie Shy CONCERNING:  METFORMIN SAFE ADMINISTRATION POLICY  RECOMMENDATION: Metformin has been placed on DISCONTINUE (rejected order) STATUS and should be reordered only after any of the conditions below are ruled out.  Current safety recommendations include avoiding metformin for a minimum of 48 hours after the patient's exposure to intravenous contrast media.  DESCRIPTION:  The Pharmacy Committee has adopted a policy that restricts the use of metformin in hospitalized patients until all the contraindications to administration have been ruled out. Specific contraindications are: [x]  Serum creatinine ? 1.5 for males []  Serum creatinine ? 1.4 for females []  Shock, acute MI, sepsis, hypoxemia, dehydration []  Planned administration of intravenous iodinated contrast media []  Heart Failure patients with low EF []  Acute or chronic metabolic acidosis (including DKA)   Loralee Pacas, PharmD, BCPS 04/22/2012 5:15 PM

## 2012-04-22 NOTE — ED Notes (Signed)
Verbal order to give patient 1L fluid PO before transferring back to Psych ED.

## 2012-04-22 NOTE — ED Notes (Signed)
Patient offered water and refused
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
## 2012-04-22 NOTE — ED Provider Notes (Signed)
History     CSN: [id number]  Arrival date & time 04/22/12  1355   First MD Initiated Contact with Patient 04/22/12 1413      Chief Complaint  Patient presents with  . Medical Clearance    (Consider location/radiation/quality/duration/timing/severity/associated sxs/prior treatment) HPI Comments: This is a 52 year old man who presents today after being sent from [facility] where is under involuntary commitment signed by a staff member at his group home. He was sent for psychosis agression. He was found by the police wandering on the road beside [place]. He states he was arguing about someone with money. He made threats to the police officers. He has SI. He has a history of this type of behavior. He does not admit to any pain or SOB.   The history is provided by the patient. No language interpreter was used.     Past Medical History  Diagnosis Date  . Diabetes mellitus   . Anxiety   . Hypertension   . Hyperlipemia   . Mental disorder   . Schizoaffective disorder   . Impulsive control disorder  . Pancreatitis   . Otitis media   . Cancer   . HOH (hard of hearing)   . Tourette's disease   . Mental retardation     Past Surgical History  Procedure Laterality Date  . 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 High blood pressure Sister   . High Cholesterol Sister   . Heart disease Mother   . Stroke Mother     History  Substance Use Topics  . Smoking status: Never Smoker   . Smokeless tobacco: Never Used  . Alcohol Use: No      Review of Systems  Respiratory: Negative for shortness of breath.   Cardiovascular: Negative for chest pain.  Gastrointestinal: Negative for abdominal pain.  Skin: Negative for rash.  Psychiatric/Behavioral: Positive for suicidal ideas, behavioral problems and agitation.  All other systems reviewed and are negative.    Allergies  Review of patient's allergies indicates  no known allergies.  Home Medications   Current Outpatient Rx  Name  Route  Sig  Dispense  Refill  . buPROPion (WELLBUTRIN XL) 150 MG 24 hr tablet   Oral   Take 300 mg by mouth daily.         . cholecalciferol (VITAMIN D) 1000 UNITS tablet   Oral   Take 1,000 Units by mouth daily.         Marland Kitchen dicyclomine (BENTYL) 20 MG tablet   Oral   Take 20 mg by mouth 2 (two) times daily.          . insulin regular (HUMULIN R) 100 units/mL injection   Subcutaneous   Inject 2-12 Units into the skin 3 (three) times daily before meals. Sliding scale         . loratadine (CLARITIN) 10 MG tablet   Oral   Take 10 mg by mouth daily.         . metFORMIN (GLUCOPHAGE) 500 MG tablet   Oral   Take 500 mg by mouth daily with breakfast.         . metoCLOPramide (REGLAN) 5 MG tablet   Oral   Take 1 tablet (5 mg total) by mouth 4 (four) times daily -  before meals and at bedtime.   120 tablet   0   .  metoprolol tartrate (LOPRESSOR) 12.5 mg TABS   Oral   Take 0.5 tablets (12.5 mg total) by mouth 2 (two) times daily.   30 tablet   0   . omeprazole (PRILOSEC) 20 MG capsule   Oral   Take 20 mg by mouth daily.         . ondansetron (ZOFRAN-ODT) 4 MG disintegrating tablet   Oral   Take 4 mg by mouth every 8 (eight) hours as needed for nausea.         . pantoprazole (PROTONIX) 40 MG tablet   Oral   Take 40 mg by mouth 2 (two) times daily.         . polyethylene glycol (MIRALAX / GLYCOLAX) packet   Oral   Take 17 g by mouth every morning.         . pravastatin (PRAVACHOL) 20 MG tablet   Oral   Take 20 mg by mouth at bedtime.          Marland Kitchen QUEtiapine (SEROQUEL) 100 MG tablet   Oral   Take 100 mg by mouth at bedtime.         Marland Kitchen QUEtiapine (SEROQUEL) 200 MG tablet   Oral   Take 600 mg by mouth at bedtime.         . sennosides-docusate sodium (SENOKOT-S) 8.6-50 MG tablet   Oral   Take 1 tablet by mouth at bedtime as needed for constipation.         Marland Kitchen testosterone  cypionate (DEPO-TESTOSTERONE) 200 MG/ML injection   Intramuscular   Inject 100 mg into the muscle every 7 (seven) days.          . traMADol (ULTRAM) 50 MG tablet   Oral   Take 50 mg by mouth 3 (three) times daily as needed for pain.         . vitamin B-12 (CYANOCOBALAMIN) 1000 MCG tablet   Oral   Take 1,000 mcg by mouth every morning.          . zolpidem (AMBIEN) 5 MG tablet   Oral   Take 5 mg by mouth at bedtime as needed for sleep.         Marland Kitchen amLODipine (NORVASC) 5 MG tablet   Oral   Take 5 mg by mouth daily.         Marland Kitchen aspirin EC 81 MG tablet   Oral   Take 81 mg by mouth daily.         . famotidine (PEPCID) 20 MG tablet   Oral   Take 20 mg by mouth 2 (two) times daily.         Marland Kitchen HYDROcodone-acetaminophen (NORCO/VICODIN) 5-325 MG per tablet   Oral   Take 1-2 tablets by mouth every 4 (four) hours as needed for pain.           BP 139/89  Pulse 123  Temp(Src) 98.3 F (36.8 C) (Oral)  Resp 16  SpO2 99%  Physical Exam - done by Dr. Gray Bernhardt  ED Course  Procedures (including critical care time)  Labs Reviewed  COMPREHENSIVE METABOLIC PANEL - Abnormal; Notable for the following:    Glucose, Bld 114 (*)    Creatinine, Ser 1.70 (*)    GFR calc non Af Amer 45 (*)    GFR calc Af Amer 52 (*)    All other components within normal limits  SALICYLATE LEVEL - Abnormal; Notable for the following:    Salicylate Lvl <2.0 (*)  All other components within normal limits  GLUCOSE, CAPILLARY - Abnormal; Notable for the following:    Glucose-Capillary 122 (*)    All other components within normal limits  GLUCOSE, CAPILLARY - Abnormal; Notable for the following:    Glucose-Capillary 151 (*)    All other components within normal limits  BASIC METABOLIC PANEL - Abnormal; Notable for the following:    Glucose, Bld 115 (*)    Creatinine, Ser 1.80 (*)    GFR calc non Af Amer 42 (*)    GFR calc Af Amer 48 (*)    All other components within normal limits   GLUCOSE, CAPILLARY - Abnormal; Notable for the following:    Glucose-Capillary 300 (*)    All other components within normal limits  ACETAMINOPHEN LEVEL  CBC  ETHANOL  URINE RAPID DRUG SCREEN (HOSP PERFORMED)   No results found.   No diagnosis found. Mental retardation Schizophrenia   MDM  This patient presented after being sent in from his group home under involuntary commitment. Papers were reviewed. He was alternating between yelling and singing. Making threats to the police officers and causing general disruption on the floor. He was given 20mg  of Geodon and showed much improvement and is now stable and agreeable. He has a history of schizophrenia and MR. Labs show a bump in his creatinine. 1 liter of PO fluids given.  ACT team contacted. Likely admission to psych unit.        Mora Bellman, PA-C 04/23/12 1359

## 2012-04-22 NOTE — ED Notes (Signed)
4 point restraints/velcro placed at 2230, Telephone order of Dr Blinda Leatherwood.

## 2012-04-23 LAB — GLUCOSE, CAPILLARY
Glucose-Capillary: 122 mg/dL — ABNORMAL HIGH (ref 70–99)
Glucose-Capillary: 151 mg/dL — ABNORMAL HIGH (ref 70–99)
Glucose-Capillary: 300 mg/dL — ABNORMAL HIGH (ref 70–99)

## 2012-04-23 LAB — BASIC METABOLIC PANEL
CO2: 24 mEq/L (ref 19–32)
Chloride: 103 mEq/L (ref 96–112)
Creatinine, Ser: 1.8 mg/dL — ABNORMAL HIGH (ref 0.50–1.35)

## 2012-04-23 LAB — RAPID URINE DRUG SCREEN, HOSP PERFORMED
Amphetamines: NOT DETECTED
Opiates: NOT DETECTED

## 2012-04-23 MED ORDER — VITAMIN B-12 1000 MCG PO TABS
1000.0000 ug | ORAL_TABLET | Freq: Every morning | ORAL | Status: DC
  Administered 2012-04-23 – 2012-04-24 (×2): 1000 ug via ORAL
  Filled 2012-04-23 (×2): qty 1

## 2012-04-23 MED ORDER — ONDANSETRON 4 MG PO TBDP
8.0000 mg | ORAL_TABLET | Freq: Once | ORAL | Status: AC
Start: 2012-04-23 — End: 2012-04-23
  Administered 2012-04-23: 8 mg via ORAL
  Filled 2012-04-23: qty 1

## 2012-04-23 NOTE — BHH Counselor (Signed)
Writer attempted to reassess pt but pt began vomiting. RN aware.   Evette Cristal, [place] Assessment Counselor

## 2012-04-23 NOTE — ED Notes (Signed)
Pleasant, cooperative. Continues to laugh to himself.

## 2012-04-23 NOTE — ED Provider Notes (Signed)
52yo M, hx SAD and MR, brought into ED under IVC from group home due to agitation.  Pt was recently released from a psych facility several days ago.  Pt had received geodon IM shortly after arrival, and was unable to participate in Telepsych eval.  Pt is now awake/alert and able to participate in consult.  Telepsych Dr. Rob Bunting eval pt:  States pt is anxious, restless, with labile affect, J/I severely impaired; in view of aggressive behavior and labile affect, needs psych admission.  ACT team made aware of need for placement.   Laray Anger, DO 04/23/12 1200

## 2012-04-24 ENCOUNTER — Emergency Department (HOSPITAL_COMMUNITY)
Admission: EM | Admit: 2012-04-24 | Discharge: 2012-04-24 | Disposition: A | Discharge status: Home / Self Care | Payer: Medicare Other | Attending: Emergency Medicine | Admitting: Emergency Medicine

## 2012-04-24 ENCOUNTER — Emergency Department (HOSPITAL_COMMUNITY): Payer: Medicare Other

## 2012-04-24 DIAGNOSIS — I10 Essential (primary) hypertension: Secondary | ICD-10-CM | POA: Insufficient documentation

## 2012-04-24 DIAGNOSIS — Z8669 Personal history of other diseases of the nervous system and sense organs: Secondary | ICD-10-CM | POA: Insufficient documentation

## 2012-04-24 DIAGNOSIS — F952 Tourette's disorder: Secondary | ICD-10-CM | POA: Insufficient documentation

## 2012-04-24 DIAGNOSIS — F79 Unspecified intellectual disabilities: Secondary | ICD-10-CM | POA: Insufficient documentation

## 2012-04-24 DIAGNOSIS — F259 Schizoaffective disorder, unspecified: Secondary | ICD-10-CM | POA: Insufficient documentation

## 2012-04-24 DIAGNOSIS — M25511 Pain in right shoulder: Secondary | ICD-10-CM

## 2012-04-24 DIAGNOSIS — Z79899 Other long term (current) drug therapy: Secondary | ICD-10-CM | POA: Insufficient documentation

## 2012-04-24 DIAGNOSIS — M25519 Pain in unspecified shoulder: Secondary | ICD-10-CM | POA: Insufficient documentation

## 2012-04-24 DIAGNOSIS — E785 Hyperlipidemia, unspecified: Secondary | ICD-10-CM | POA: Insufficient documentation

## 2012-04-24 DIAGNOSIS — Z794 Long term (current) use of insulin: Secondary | ICD-10-CM | POA: Insufficient documentation

## 2012-04-24 DIAGNOSIS — F411 Generalized anxiety disorder: Secondary | ICD-10-CM | POA: Insufficient documentation

## 2012-04-24 DIAGNOSIS — Z8719 Personal history of other diseases of the digestive system: Secondary | ICD-10-CM | POA: Insufficient documentation

## 2012-04-24 DIAGNOSIS — E119 Type 2 diabetes mellitus without complications: Secondary | ICD-10-CM | POA: Insufficient documentation

## 2012-04-24 DIAGNOSIS — Z7982 Long term (current) use of aspirin: Secondary | ICD-10-CM | POA: Insufficient documentation

## 2012-04-24 DIAGNOSIS — Z859 Personal history of malignant neoplasm, unspecified: Secondary | ICD-10-CM | POA: Insufficient documentation

## 2012-04-24 DIAGNOSIS — R269 Unspecified abnormalities of gait and mobility: Secondary | ICD-10-CM | POA: Insufficient documentation

## 2012-04-24 LAB — BASIC METABOLIC PANEL
Calcium: 8.9 mg/dL (ref 8.4–10.5)
Chloride: 104 mEq/L (ref 96–112)
Creatinine, Ser: 1.74 mg/dL — ABNORMAL HIGH (ref 0.50–1.35)
GFR calc Af Amer: 50 mL/min — ABNORMAL LOW (ref >90)

## 2012-04-24 LAB — GLUCOSE, CAPILLARY
Glucose-Capillary: 118 mg/dL — ABNORMAL HIGH (ref 70–99)
Glucose-Capillary: 110 mg/dL — ABNORMAL HIGH (ref 70–99)
Glucose-Capillary: 93 mg/dL (ref 70–99)

## 2012-04-24 MED ORDER — HYDROCODONE-ACETAMINOPHEN 5-325 MG PO TABS
2.0000 | ORAL_TABLET | Freq: Once | ORAL | Status: AC
  Administered 2012-04-24: 2 via ORAL
  Filled 2012-04-24: qty 2

## 2012-04-24 NOTE — Progress Notes (Addendum)
Contacted by RN and ACT asking for an FL2.  Spoke with Dara at [facility].  Explained to Dara that Pt is in the ED, thus no FL2 is needed.  Dara agreed and stated that she misunderstood; she thought Pt had been admitted into the hospital.  Notified RN that no FL2 needed, as Pt is not admitted into the hospital.  RN to arrange d/c, including transportation.  Providence Crosby, LCSWA Clinical Social Work [phone]

## 2012-04-24 NOTE — BHH Counselor (Signed)
Faxed referral to [facility] 3/29 2230 - they have beds. Spoke with Lynden Ang at [facility] 3/29 2330 - they don't have beds currently. Pt can't be considered for their DD wing as there is no documentation re: MR dx in pt's chart.  Evette Cristal, [place] Assessment Counselor

## 2012-04-24 NOTE — ED Notes (Signed)
Notified Dara White at [employer] of patients D/C. Will call [facility] for transportation.

## 2012-04-24 NOTE — ED Notes (Signed)
Pt's family states that he was just released from [facility] ED here at [facility] after 3 days. When he got home he was having trouble walking because of mid back pain on R side.

## 2012-04-24 NOTE — Progress Notes (Signed)
Spoke with Dr. Lars Mage and she had seen pt.  She will rescind IVC.  Nurse will coordinate d/c once MD puts pt up for d/c.

## 2012-04-24 NOTE — ED Provider Notes (Signed)
History    This chart was scribed for Marlon Pel, PA, non-physician practitioner working with Derwood Kaplan, MD by Charolett Bumpers, ED Scribe. This patient was seen in room WTR9/WTR9 and the patient's care was started at 6:08 PM.   CSN: [id number]  Arrival date & time 04/24/12  1704   First MD Initiated Contact with Patient 04/24/12 1808      Chief Complaint  Patient presents with  . Back Pain    The history is provided by the patient. No language interpreter was used.   Shawn Baird is a 52 y.o. male who presents to the Emergency Department complaining of constant, moderate mid right back pain that started this morning near his shoulder. He was released from [facility] ED here at [facility] this morning after 3 days in the hospital. Family states that he has been having trouble walking around, stumbling and limping, since he was discharged this morning. Pt denies any hip or leg pain. Pt's medications were increased upon discharge. He has a h/o DM, HTN, schizoaffective disorder, MR and is hard of hearing.    Past Medical History  Diagnosis Date  . Diabetes mellitus   . Anxiety   . Hypertension   . Hyperlipemia   . Mental disorder   . Schizoaffective disorder   . Impulsive control disorder  . Pancreatitis   . Otitis media   . Cancer   . HOH (hard of hearing)   . Tourette's disease   . Mental retardation     Past Surgical History  Procedure Laterality Date  . 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 High blood pressure Sister   . High Cholesterol Sister   . Heart disease Mother   . Stroke Mother     History  Substance Use Topics  . Smoking status: Never Smoker   . Smokeless tobacco: Never Used  . Alcohol Use: No      Review of Systems  Musculoskeletal: Positive for back pain and gait problem. Negative for arthralgias.  All other systems reviewed and are negative.    Allergies   Review of patient's allergies indicates no known allergies.  Home Medications   Current Outpatient Rx  Name  Route  Sig  Dispense  Refill  . amLODipine (NORVASC) 5 MG tablet   Oral   Take 5 mg by mouth daily.         Marland Kitchen aspirin EC 81 MG tablet   Oral   Take 81 mg by mouth daily.         Marland Kitchen buPROPion (WELLBUTRIN XL) 150 MG 24 hr tablet   Oral   Take 300 mg by mouth daily.         . cholecalciferol (VITAMIN D) 1000 UNITS tablet   Oral   Take 1,000 Units by mouth daily.         Marland Kitchen dicyclomine (BENTYL) 20 MG tablet   Oral   Take 20 mg by mouth 2 (two) times daily.          . famotidine (PEPCID) 20 MG tablet   Oral   Take 20 mg by mouth 2 (two) times daily.         Marland Kitchen HYDROcodone-acetaminophen (NORCO/VICODIN) 5-325 MG per tablet   Oral   Take 1-2 tablets by mouth every 4 (four) hours as needed for pain.         Marland Kitchen  insulin regular (HUMULIN R) 100 units/mL injection   Subcutaneous   Inject 2-12 Units into the skin 3 (three) times daily before meals. Sliding scale         . loratadine (CLARITIN) 10 MG tablet   Oral   Take 10 mg by mouth daily.         . metFORMIN (GLUCOPHAGE) 500 MG tablet   Oral   Take 500 mg by mouth daily with breakfast.         . metoCLOPramide (REGLAN) 5 MG tablet   Oral   Take 1 tablet (5 mg total) by mouth 4 (four) times daily -  before meals and at bedtime.   120 tablet   0   . metoprolol tartrate (LOPRESSOR) 12.5 mg TABS   Oral   Take 0.5 tablets (12.5 mg total) by mouth 2 (two) times daily.   30 tablet   0   . omeprazole (PRILOSEC) 20 MG capsule   Oral   Take 20 mg by mouth daily.         . ondansetron (ZOFRAN-ODT) 4 MG disintegrating tablet   Oral   Take 4 mg by mouth every 8 (eight) hours as needed for nausea.         . pantoprazole (PROTONIX) 40 MG tablet   Oral   Take 40 mg by mouth 2 (two) times daily.         . polyethylene glycol (MIRALAX / GLYCOLAX) packet   Oral   Take 17 g by mouth every  morning.         . pravastatin (PRAVACHOL) 20 MG tablet   Oral   Take 20 mg by mouth at bedtime.          Marland Kitchen QUEtiapine (SEROQUEL) 100 MG tablet   Oral   Take 100 mg by mouth at bedtime.         Marland Kitchen QUEtiapine (SEROQUEL) 200 MG tablet   Oral   Take 600 mg by mouth at bedtime.         . sennosides-docusate sodium (SENOKOT-S) 8.6-50 MG tablet   Oral   Take 1 tablet by mouth at bedtime as needed for constipation.         Marland Kitchen testosterone cypionate (DEPO-TESTOSTERONE) 200 MG/ML injection   Intramuscular   Inject 100 mg into the muscle every 7 (seven) days.          . traMADol (ULTRAM) 50 MG tablet   Oral   Take 50 mg by mouth 3 (three) times daily as needed for pain.         . vitamin B-12 (CYANOCOBALAMIN) 1000 MCG tablet   Oral   Take 1,000 mcg by mouth every morning.          . zolpidem (AMBIEN) 5 MG tablet   Oral   Take 5 mg by mouth at bedtime as needed for sleep.           BP 121/89  Pulse 88  Temp(Src) 98.1 F (36.7 C) (Oral)  Resp 20  SpO2 99%  Physical Exam  Nursing note and vitals reviewed. Constitutional: He is oriented to person, place, and time. He appears well-developed and well-nourished. No distress.  HENT:  Head: Normocephalic and atraumatic.  Eyes: EOM are normal.  Neck: Neck supple. No tracheal deviation present.  Cardiovascular: Normal rate.   Pulmonary/Chest: Effort normal. No respiratory distress.  Musculoskeletal: Normal range of motion.       Right shoulder: He exhibits tenderness, pain and spasm.  Right hip: Normal.       Left hip: Normal.       Arms: Pt limps during ambulation but denies pain.  Neurological: He is alert and oriented to person, place, and time.  Skin: Skin is warm and dry.  Psychiatric: He has a normal mood and affect. His behavior is normal.    ED Course  Procedures (including critical care time)  DIAGNOSTIC STUDIES: Oxygen Saturation is 99% on RA, normal by my interpretation.    COORDINATION  OF CARE:  6:50 PM-Informed family of imaging results. Discussed planned course of treatment with the family, including an x-ray of his hips, who is agreeable at this time.    Labs Reviewed - No data to display Dg Thoracic Spine 2 View  04/24/2012  *RADIOLOGY REPORT*  Clinical Data: Back pain  THORACIC SPINE - 2 VIEW  Comparison: 01/01/2012  Findings: No vertebral compression deformity.  Anatomic alignment. Mild degenerative change throughout the thoracic spine.  Disc height is relatively maintained.  IMPRESSION: No acute bony injury.   Original Report Authenticated By: Jolaine Click, M.D.    Dg Hip Bilateral Vito Berger  04/24/2012  *RADIOLOGY REPORT*  Clinical Data: Back pain, limping.  BILATERAL HIP WITH PELVIS - 4+ VIEW  Comparison: None.  Findings: No acute bony abnormality.  Specifically, no fracture, subluxation, or dislocation.  Soft tissues are intact.  SI joints and hip joints are symmetric and unremarkable.  IMPRESSION: No acute bony abnormality.   Original Report Authenticated By: Charlett Nose, M.D.      1. Right shoulder pain       MDM  Pt is schizoaffective. Family members brought in because he has been complaining of right shoulder pain and "walking funny". The patient denies any pain in his hips or lower extremities but does have an altered gait. Physical exam is normal. Xrays of the hips are normal. I am unsure of why the limping. No weakness when sitting down. May be due to psychotic status. Was just discharged from [facility] this morning. Otherwise acting at baseline.    Pt has been advised of the symptoms that warrant their return to the ED. Patient has voiced understanding and has agreed to follow-up with the PCP or specialist.  I personally performed the services described in this documentation, which was scribed in my presence. The recorded information has been reviewed and is accurate.      Dorthula Matas, PA-C 04/24/12 2012

## 2012-04-24 NOTE — ED Notes (Signed)
MD notified of pt crying and in pain. Pt has been vomiting since 7 pm after he ate dinner. Pt fiven zofran earlier and it is effective. Pt complain of back pain. Md gave orders for pain medication.

## 2012-04-24 NOTE — ED Notes (Signed)
Notified [employer] of [facility] needed for transportation of patient to his home.

## 2012-04-24 NOTE — ED Provider Notes (Signed)
ACT staff states patient has been calm and cooperative the past two days. Pt's roommate who also has MR had put this patients pants on by mistake but the pants had this patients money in it and that was why he was so upset. His facility is willing to take him back and he is willing to go back.   IVC papers rescinded by me.    Devoria Albe, MD, Armando Gang   Ward Givens, MD 04/24/12 [phone]

## 2012-04-24 NOTE — ED Notes (Signed)
D/C'd home via [facility] transportation. Belongings x2 bags returned. Denies pain and is ambulatory.

## 2012-04-24 NOTE — ED Notes (Signed)
Pt with unsteady gait. Poor historian but denies injury. Caregiver at bedside.

## 2012-04-24 NOTE — ED Notes (Signed)
Asked patient about his hearing aids and he stated "they are probably at home".

## 2012-04-24 NOTE — ED Notes (Signed)
Spoke with Larkin Ina (supervisor) at [facility] [phone]) regarding his hearing aids. Unable to locate in his belongings.

## 2012-04-24 NOTE — ED Provider Notes (Signed)
Pt sent from [facility] from his group home for making threats and aggressive behaviors. Has had telepsych consult recommending inpatient treatment.  Pt per notes had vomiting last night controlled by zofran.   Pt sleeping, easily awakened, smiling, states he is fine.   Waiting placement.   Ward Givens, MD 04/24/12 [phone]

## 2012-04-25 NOTE — ED Provider Notes (Signed)
Medical screening examination/treatment/procedure(s) were performed by non-physician practitioner and as supervising physician I was immediately available for consultation/collaboration.   , MD 04/25/12 0026 

## 2012-05-18 ENCOUNTER — Emergency Department (HOSPITAL_COMMUNITY)
Admission: EM | Admit: 2012-05-18 | Discharge: 2012-05-18 | Disposition: A | Discharge status: Home / Self Care | Payer: Medicare Other | Attending: Emergency Medicine | Admitting: Emergency Medicine

## 2012-05-18 ENCOUNTER — Encounter (HOSPITAL_COMMUNITY): Payer: Self-pay | Admitting: *Deleted

## 2012-05-18 DIAGNOSIS — F79 Unspecified intellectual disabilities: Secondary | ICD-10-CM

## 2012-05-18 DIAGNOSIS — Z794 Long term (current) use of insulin: Secondary | ICD-10-CM | POA: Insufficient documentation

## 2012-05-18 DIAGNOSIS — F411 Generalized anxiety disorder: Secondary | ICD-10-CM | POA: Insufficient documentation

## 2012-05-18 DIAGNOSIS — F489 Nonpsychotic mental disorder, unspecified: Secondary | ICD-10-CM | POA: Insufficient documentation

## 2012-05-18 DIAGNOSIS — F919 Conduct disorder, unspecified: Secondary | ICD-10-CM

## 2012-05-18 DIAGNOSIS — F209 Schizophrenia, unspecified: Secondary | ICD-10-CM

## 2012-05-18 DIAGNOSIS — Z859 Personal history of malignant neoplasm, unspecified: Secondary | ICD-10-CM | POA: Insufficient documentation

## 2012-05-18 DIAGNOSIS — F71 Moderate intellectual disabilities: Secondary | ICD-10-CM | POA: Insufficient documentation

## 2012-05-18 DIAGNOSIS — F259 Schizoaffective disorder, unspecified: Secondary | ICD-10-CM

## 2012-05-18 DIAGNOSIS — E785 Hyperlipidemia, unspecified: Secondary | ICD-10-CM | POA: Insufficient documentation

## 2012-05-18 DIAGNOSIS — Z8719 Personal history of other diseases of the digestive system: Secondary | ICD-10-CM | POA: Insufficient documentation

## 2012-05-18 DIAGNOSIS — F29 Unspecified psychosis not due to a substance or known physiological condition: Secondary | ICD-10-CM | POA: Insufficient documentation

## 2012-05-18 DIAGNOSIS — E119 Type 2 diabetes mellitus without complications: Secondary | ICD-10-CM | POA: Insufficient documentation

## 2012-05-18 DIAGNOSIS — Z8669 Personal history of other diseases of the nervous system and sense organs: Secondary | ICD-10-CM | POA: Insufficient documentation

## 2012-05-18 DIAGNOSIS — I10 Essential (primary) hypertension: Secondary | ICD-10-CM | POA: Insufficient documentation

## 2012-05-18 DIAGNOSIS — Z7982 Long term (current) use of aspirin: Secondary | ICD-10-CM | POA: Insufficient documentation

## 2012-05-18 DIAGNOSIS — Z79899 Other long term (current) drug therapy: Secondary | ICD-10-CM | POA: Insufficient documentation

## 2012-05-18 LAB — RAPID URINE DRUG SCREEN, HOSP PERFORMED
Amphetamines: NOT DETECTED
Cocaine: NOT DETECTED
Opiates: NOT DETECTED
Tetrahydrocannabinol: NOT DETECTED

## 2012-05-18 LAB — CBC
MCH: 29 pg (ref 26.0–34.0)
MCHC: 34.1 g/dL (ref 30.0–36.0)
Platelets: 238 10*3/uL (ref 150–400)
RBC: 5.31 MIL/uL (ref 4.22–5.81)
RDW: 14.5 % (ref 11.5–15.5)

## 2012-05-18 LAB — COMPREHENSIVE METABOLIC PANEL
ALT: 23 U/L (ref 0–53)
AST: 19 U/L (ref 0–37)
Albumin: 3.7 g/dL (ref 3.5–5.2)
Calcium: 9.7 mg/dL (ref 8.4–10.5)
Sodium: 138 mEq/L (ref 135–145)
Total Protein: 7.3 g/dL (ref 6.0–8.3)

## 2012-05-18 MED ORDER — SIMVASTATIN 10 MG PO TABS
10.0000 mg | ORAL_TABLET | Freq: Every day | ORAL | Status: DC
  Filled 2012-05-18: qty 1

## 2012-05-18 MED ORDER — VITAMIN D3 25 MCG (1000 UNIT) PO TABS
1000.0000 [IU] | ORAL_TABLET | Freq: Every day | ORAL | Status: DC
  Administered 2012-05-18: 1000 [IU] via ORAL
  Filled 2012-05-18: qty 1

## 2012-05-18 MED ORDER — ONDANSETRON 4 MG PO TBDP: 4.0000 mg | ORAL_TABLET | Freq: Three times a day (TID) | ORAL | Status: DC | PRN

## 2012-05-18 MED ORDER — PANTOPRAZOLE SODIUM 40 MG PO TBEC
40.0000 mg | DELAYED_RELEASE_TABLET | Freq: Two times a day (BID) | ORAL | Status: DC
  Administered 2012-05-18: 40 mg via ORAL
  Filled 2012-05-18: qty 1

## 2012-05-18 MED ORDER — METOCLOPRAMIDE HCL 10 MG PO TABS
5.0000 mg | ORAL_TABLET | Freq: Three times a day (TID) | ORAL | Status: DC
  Administered 2012-05-18 (×2): 5 mg via ORAL
  Filled 2012-05-18 (×2): qty 1

## 2012-05-18 MED ORDER — LORAZEPAM 1 MG PO TABS: 1.0000 mg | ORAL_TABLET | Freq: Three times a day (TID) | ORAL | Status: DC | PRN

## 2012-05-18 MED ORDER — QUETIAPINE FUMARATE 300 MG PO TABS
600.0000 mg | ORAL_TABLET | Freq: Every day | ORAL | Status: DC
  Administered 2012-05-18: 600 mg via ORAL
  Filled 2012-05-18: qty 2

## 2012-05-18 MED ORDER — PANTOPRAZOLE SODIUM 40 MG PO TBEC: 40.0000 mg | DELAYED_RELEASE_TABLET | Freq: Every day | ORAL | Status: DC

## 2012-05-18 MED ORDER — FAMOTIDINE 20 MG PO TABS: 20.0000 mg | ORAL_TABLET | Freq: Two times a day (BID) | ORAL | Status: DC

## 2012-05-18 MED ORDER — ALUM & MAG HYDROXIDE-SIMETH 200-200-20 MG/5ML PO SUSP: 30.0000 mL | ORAL | Status: DC | PRN

## 2012-05-18 MED ORDER — AMLODIPINE BESYLATE 5 MG PO TABS
5.0000 mg | ORAL_TABLET | Freq: Every day | ORAL | Status: DC
  Administered 2012-05-18: 5 mg via ORAL
  Filled 2012-05-18: qty 1

## 2012-05-18 MED ORDER — ONDANSETRON HCL 4 MG PO TABS: 4.0000 mg | ORAL_TABLET | Freq: Three times a day (TID) | ORAL | Status: DC | PRN

## 2012-05-18 MED ORDER — IBUPROFEN 600 MG PO TABS: 600.0000 mg | ORAL_TABLET | Freq: Three times a day (TID) | ORAL | Status: DC | PRN

## 2012-05-18 MED ORDER — POLYETHYLENE GLYCOL 3350 17 G PO PACK
17.0000 g | PACK | Freq: Every morning | ORAL | Status: DC
  Administered 2012-05-18: 17 g via ORAL
  Filled 2012-05-18: qty 1

## 2012-05-18 MED ORDER — METFORMIN HCL 500 MG PO TABS: 500.0000 mg | ORAL_TABLET | Freq: Every day | ORAL | Status: DC

## 2012-05-18 MED ORDER — ACETAMINOPHEN 325 MG PO TABS: 650.0000 mg | ORAL_TABLET | ORAL | Status: DC | PRN

## 2012-05-18 MED ORDER — ZOLPIDEM TARTRATE 5 MG PO TABS: 5.0000 mg | ORAL_TABLET | Freq: Every evening | ORAL | Status: DC | PRN

## 2012-05-18 MED ORDER — ASPIRIN EC 81 MG PO TBEC
81.0000 mg | DELAYED_RELEASE_TABLET | Freq: Every day | ORAL | Status: DC
  Administered 2012-05-18: 81 mg via ORAL
  Filled 2012-05-18: qty 1

## 2012-05-18 MED ORDER — QUETIAPINE FUMARATE 100 MG PO TABS
100.0000 mg | ORAL_TABLET | Freq: Every day | ORAL | Status: DC
  Administered 2012-05-18: 100 mg via ORAL
  Filled 2012-05-18: qty 1

## 2012-05-18 MED ORDER — METOPROLOL TARTRATE 25 MG PO TABS
12.5000 mg | ORAL_TABLET | Freq: Two times a day (BID) | ORAL | Status: DC
Start: 2012-05-18 — End: 2012-05-18
  Administered 2012-05-18: 12.5 mg via ORAL
  Filled 2012-05-18: qty 1

## 2012-05-18 MED ORDER — BUPROPION HCL ER (XL) 300 MG PO TB24
300.0000 mg | ORAL_TABLET | Freq: Every day | ORAL | Status: DC
  Administered 2012-05-18: 300 mg via ORAL
  Filled 2012-05-18: qty 1

## 2012-05-18 MED ORDER — TESTOSTERONE CYPIONATE 200 MG/ML IM SOLN: 100.0000 mg | INTRAMUSCULAR | Status: DC

## 2012-05-18 MED ORDER — LORATADINE 10 MG PO TABS
10.0000 mg | ORAL_TABLET | Freq: Every day | ORAL | Status: DC
Start: 2012-05-18 — End: 2012-05-18
  Administered 2012-05-18: 10 mg via ORAL
  Filled 2012-05-18: qty 1

## 2012-05-18 MED ORDER — ZIPRASIDONE MESYLATE 20 MG IM SOLR: 20.0000 mg | Freq: Once | INTRAMUSCULAR | Status: DC

## 2012-05-18 MED ORDER — INSULIN ASPART 100 UNIT/ML ~~LOC~~ SOLN: 2.0000 [IU] | Freq: Three times a day (TID) | SUBCUTANEOUS | Status: DC

## 2012-05-18 MED ORDER — VITAMIN B-12 1000 MCG PO TABS
1000.0000 ug | ORAL_TABLET | Freq: Every morning | ORAL | Status: DC
  Administered 2012-05-18: 1000 ug via ORAL
  Filled 2012-05-18: qty 1

## 2012-05-18 MED ORDER — DICYCLOMINE HCL 20 MG PO TABS
20.0000 mg | ORAL_TABLET | Freq: Two times a day (BID) | ORAL | Status: DC
  Administered 2012-05-18: 20 mg via ORAL
  Filled 2012-05-18: qty 1

## 2012-05-18 NOTE — ED Notes (Signed)
PT to ER via [employer]; pt is being brought in under IVC; pt lives at a group home and they are reporting that he has been "out of control" with his behavior; pt with impulse control disorder and report that the pt is a danger to himself and others.

## 2012-05-18 NOTE — ED Provider Notes (Signed)
History     CSN: [id number]  Arrival date & time 05/18/12  0013   First MD Initiated Contact with Patient 05/18/12 0059      Chief Complaint  Patient presents with  . Medical Clearance    (Consider location/radiation/quality/duration/timing/severity/associated sxs/prior treatment) HPI Comments: Patient is a 52 year old male with history of MR, schizoaffective disorder, "mental disorder". He is well-known to the ED. He presents today brought in by the police after they were called from his group home. He was yelling he will "blow your brains out". He states that he has suicidal ideations he "wants to blow his own brains out". He talks about how he is upset with his mother. His mother is deceased. He is a poor historian and history is limited by his condition. he states he is in no pain. He frequently comes to the ED for psychosis and disruptive behavior.    Past Medical History  Diagnosis Date  . Diabetes mellitus   . Anxiety   . Hypertension   . Hyperlipemia   . Mental disorder   . Schizoaffective disorder   . Impulsive control disorder  . Pancreatitis   . Otitis media   . Cancer   . HOH (hard of hearing)   . Tourette's disease   . Mental retardation     Past Surgical History  Procedure Laterality Date  . 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 High blood pressure Sister   . High Cholesterol Sister   . Heart disease Mother   . Stroke Mother     History  Substance Use Topics  . Smoking status: Never Smoker   . Smokeless tobacco: Never Used  . Alcohol Use: No      Review of Systems  Unable to perform ROS: Psychiatric disorder    Allergies  Review of patient's allergies indicates no known allergies.  Home Medications   Current Outpatient Rx  Name  Route  Sig  Dispense  Refill  . amLODipine (NORVASC) 5 MG tablet   Oral   Take 5 mg by mouth daily.         Marland Kitchen aspirin EC 81 MG  tablet   Oral   Take 81 mg by mouth daily.         Marland Kitchen buPROPion (WELLBUTRIN XL) 150 MG 24 hr tablet   Oral   Take 300 mg by mouth daily.         . cholecalciferol (VITAMIN D) 1000 UNITS tablet   Oral   Take 1,000 Units by mouth daily.         Marland Kitchen dicyclomine (BENTYL) 20 MG tablet   Oral   Take 20 mg by mouth 2 (two) times daily.          . famotidine (PEPCID) 20 MG tablet   Oral   Take 20 mg by mouth 2 (two) times daily.         Marland Kitchen HYDROcodone-acetaminophen (NORCO/VICODIN) 5-325 MG per tablet   Oral   Take 1-2 tablets by mouth every 4 (four) hours as needed for pain.         Marland Kitchen insulin regular (HUMULIN R) 100 units/mL injection   Subcutaneous   Inject 2-12 Units into the skin 3 (three) times daily before meals. Sliding scale         . loratadine (CLARITIN) 10 MG tablet   Oral   Take  10 mg by mouth daily.         . metFORMIN (GLUCOPHAGE) 500 MG tablet   Oral   Take 500 mg by mouth daily with breakfast.         . metoCLOPramide (REGLAN) 5 MG tablet   Oral   Take 1 tablet (5 mg total) by mouth 4 (four) times daily -  before meals and at bedtime.   120 tablet   0   . metoprolol tartrate (LOPRESSOR) 12.5 mg TABS   Oral   Take 0.5 tablets (12.5 mg total) by mouth 2 (two) times daily.   30 tablet   0   . omeprazole (PRILOSEC) 20 MG capsule   Oral   Take 20 mg by mouth daily.         . ondansetron (ZOFRAN-ODT) 4 MG disintegrating tablet   Oral   Take 4 mg by mouth every 8 (eight) hours as needed for nausea.         . pantoprazole (PROTONIX) 40 MG tablet   Oral   Take 40 mg by mouth 2 (two) times daily.         . polyethylene glycol (MIRALAX / GLYCOLAX) packet   Oral   Take 17 g by mouth every morning.         . pravastatin (PRAVACHOL) 20 MG tablet   Oral   Take 20 mg by mouth at bedtime.          Marland Kitchen QUEtiapine (SEROQUEL) 100 MG tablet   Oral   Take 100 mg by mouth at bedtime.         Marland Kitchen QUEtiapine (SEROQUEL) 200 MG tablet    Oral   Take 600 mg by mouth at bedtime.         . sennosides-docusate sodium (SENOKOT-S) 8.6-50 MG tablet   Oral   Take 1 tablet by mouth at bedtime as needed for constipation.         Marland Kitchen testosterone cypionate (DEPO-TESTOSTERONE) 200 MG/ML injection   Intramuscular   Inject 100 mg into the muscle every 7 (seven) days.          . traMADol (ULTRAM) 50 MG tablet   Oral   Take 50 mg by mouth 3 (three) times daily as needed for pain.         . vitamin B-12 (CYANOCOBALAMIN) 1000 MCG tablet   Oral   Take 1,000 mcg by mouth every morning.          . zolpidem (AMBIEN) 5 MG tablet   Oral   Take 5 mg by mouth at bedtime as needed for sleep.           BP 139/97  Pulse 99  Temp(Src) 97.5 F (36.4 C) (Oral)  Resp 20  SpO2 100%  Physical Exam  Nursing note and vitals reviewed. Constitutional: He is oriented to person, place, and time. He appears well-developed and well-nourished. No distress.  HENT:  Head: Normocephalic and atraumatic.  Right Ear: External ear normal.  Left Ear: External ear normal.  Nose: Nose normal.  Eyes: Conjunctivae are normal.  Neck: Normal range of motion. No tracheal deviation present.  Cardiovascular: Normal rate, regular rhythm and normal heart sounds.   Pulmonary/Chest: Effort normal and breath sounds normal. No stridor.  Abdominal: Soft. He exhibits no distension. There is no tenderness.  Musculoskeletal: Normal range of motion.  Neurological: He is alert and oriented to person, place, and time.  Skin: Skin is warm and dry. He is not  diaphoretic.  Psychiatric: His affect is labile. His speech is tangential. He expresses impulsivity.    ED Course  Procedures (including critical care time)  Labs Reviewed  COMPREHENSIVE METABOLIC PANEL - Abnormal; Notable for the following:    Creatinine, Ser 1.88 (*)    Total Bilirubin 0.2 (*)    GFR calc non Af Amer 39 (*)    GFR calc Af Amer 46 (*)    All other components within normal limits   SALICYLATE LEVEL - Abnormal; Notable for the following:    Salicylate Lvl <2.0 (*)    All other components within normal limits  ACETAMINOPHEN LEVEL  CBC  ETHANOL  URINE RAPID DRUG SCREEN (HOSP PERFORMED)   No results found.   1. Mental retardation, moderate (I.Q. 35-49)   2. Schizophrenia   3. Schizoaffective disorder       MDM  Patient presents today after disruptive behavior at his group home. He was brought in by the police who sit at his doorway. He sings and yells about blowing peoples brains out with accompanied sound effects and hand motions of shooting guns. The ACT team with consulted. He has plans to go to the psych ED. 20mg  of geodon was given for his disruptive behavior. Vital signs stable for transfer.         Mora Bellman, PA-C 05/18/12 1358

## 2012-05-18 NOTE — ED Provider Notes (Addendum)
Patient arrives to the emergency department under IVC paperwork from his group home.  Patient has MR, impulse control disorder.  He has been having behavioral issues at the group home.  He has told staff here he wants to blow himself up.  He is seen often in the emergency department for behavioral issues, as well as stomach complaints.  Plan is for telepsych.  [name], MD 05/18/12 6213  [name], MD 05/18/12 0400

## 2012-05-18 NOTE — ED Notes (Signed)
Shawn Baird-from group home-called to check on patient. She is his primary care and was able to explain what happened prior to his admission. With no precipitating factors, he began to tear up his hearing aids, when she tried to find out why he was doing this-he started crying, trying to leave. Shawn stated he thinks he should have special privileges from the other residents.Stated he has never acted like this before. Gave him a PRN med that did help. Also stated he has been seeing Dr.Heading- who they like very much. Shawn Baird can be reached at [phone](group home), or her cell number is [phone].

## 2012-05-18 NOTE — Progress Notes (Signed)
Per discussion with psychiatrist pt is psychiatrically stable for discharge back to group home. CSW spoke with pt group home owner, Larkin Ina, who confirmed patient can return. Per Larkin Ina, patient tore his hearing aids apart, and got upset at 830 pm after receiving meds at 8am. Per Ms. White, patient will get upset to get his way. Ms. Cliffton Asters states that they are trying to work on desecelation. Per Ms. White, patient is requesting to have family to visit more, however family never shows up. Patient goes to a day program at [facility] in [place]. Ms. Cliffton Asters is also assisting with getting his medicaid transferred to ensure patient being able to stay in the day program. Patient is followed by Dr. Penelope Galas. Pt group home is arranging transportation for patient to return and will call CSW back with transportation time.   Catha Gosselin, LCSWA  [phone] .05/18/2012 11:10am

## 2012-05-18 NOTE — BHH Counselor (Signed)
Per nursing Psych ED nursing staff, someone from patients group home called stating they are in route to pick patient up.   Patients IVC was rescinded by psychiatrist-Dr. Elsie Saas and placed in patients chart.

## 2012-05-18 NOTE — Progress Notes (Signed)
PHARMACIST - PHYSICIAN COMMUNICATION DR:   CONCERNING:  METFORMIN SAFE ADMINISTRATION POLICY  RECOMMENDATION: Metformin has been placed on DISCONTINUE (rejected order) STATUS and should be reordered only after any of the conditions below are ruled out.  Current safety recommendations include avoiding metformin for a minimum of 48 hours after the patient's exposure to intravenous contrast media.  DESCRIPTION:  The Pharmacy Committee has adopted a policy that restricts the use of metformin in hospitalized patients until all the contraindications to administration have been ruled out. Specific contraindications are: [x]  Serum creatinine ? 1.5 for males []  Serum creatinine ? 1.4 for females []  Shock, acute MI, sepsis, hypoxemia, dehydration []  Planned administration of intravenous iodinated contrast media []  Heart Failure patients with low EF []  Acute or chronic metabolic acidosis (including DKA)      Luetta Nutting PharmD, BCPS  05/18/2012, 1:30 AM

## 2012-05-18 NOTE — ED Provider Notes (Signed)
7:13 AM Filed Vitals:   05/18/12 0638  BP: 117/78  Pulse: 68  Temp: 97.6 F (36.4 C)  Resp: 18    Dr Shela Commons to see in AM for assistance with disposition. Well known to this system. No complaints this AM  Lyanne Co, MD 05/18/12 [phone]

## 2012-05-18 NOTE — ED Notes (Signed)
Pt arrived to unit; no s/s of distress noted. Pt pleasant and cooperative with staff.

## 2012-05-18 NOTE — ED Notes (Signed)
Elease Hashimoto Herandez-patient's sister and guardian-here to transport him back to group home. D/C instructions given to sister with understanding stated. Denies pain, ambulatory. Belongings returned to sister.

## 2012-05-18 NOTE — BHH Suicide Risk Assessment (Signed)
Suicide Risk Assessment  Discharge Assessment     Demographic Factors:  Male, Adolescent or young adult, Low socioeconomic status and Unemployed  Mental Status Per Nursing Assessment::   On Admission:     Current Mental Status by Physician: NA  Loss Factors: Financial problems/change in socioeconomic status and NA  Historical Factors: Impulsivity  Risk Reduction Factors:   Living with another person, especially a relative, Positive social support and Positive therapeutic relationship  Continued Clinical Symptoms:  Depression:   Aggression Impulsivity Insomnia Recent sense of peace/wellbeing Schizophrenia:   Depressive state Paranoid or undifferentiated type Unstable or Poor Therapeutic Relationship Previous Psychiatric Diagnoses and Treatments Medical Diagnoses and Treatments/Surgeries  Cognitive Features That Contribute To Risk:  Closed-mindedness Polarized thinking    Suicide Risk:  Minimal: No identifiable suicidal ideation.  Patients presenting with no risk factors but with morbid ruminations; may be classified as minimal risk based on the severity of the depressive symptoms  Discharge Diagnoses:   AXIS I:  Schizoaffective Disorder and Mental retardation AXIS II:  Mental retardation, severity unknown AXIS III:   Past Medical History  Diagnosis Date  . Diabetes mellitus   . Anxiety   . Hypertension   . Hyperlipemia   . Mental disorder   . Schizoaffective disorder   . Impulsive control disorder  . Pancreatitis   . Otitis media   . Cancer   . HOH (hard of hearing)   . Tourette's disease   . Mental retardation    AXIS IV:  economic problems, educational problems, occupational problems, other psychosocial or environmental problems and problems related to social environment AXIS V:  41-50 serious symptoms  Plan Of Care/Follow-up recommendations:  Activity:  As tolerated Diet:  Regular  Is patient on multiple antipsychotic therapies at discharge:  No    Has Patient had three or more failed trials of antipsychotic monotherapy by history:  No  Recommended Plan for Multiple Antipsychotic Therapies: Not applicable  ,[name] 05/18/2012, 11:32 AM

## 2012-05-18 NOTE — Consult Note (Signed)
Reason for Consult: Aggressive behavioral problems and mental retardation Referring Physician: Dr. Carlynn Herald Baird is an 52 y.o. male.  HPI: Patient was seen and chart reviewed. Patient was known to this provider from his multiple previous emergency room visits for this similar behavior problems. Patient has been feeling well and doing well without symptoms of mania and depression/ psychosis or aggressive behaviors. He is a resident of a group home and his mother is his legal guardian. She has been compliant with his medication without adverse effects  MSE: Patient appeared staying in his bed, quite cooperative he is awake alert and oriented. Patient has good mood and his affect was constricted. He has normal rate, rhythm, and volume of speech. His thought process is concrete and limited Patient has denied suicidal, homicidal ideations, intentions or plans. Patient has no evidence of auditory or visual hallucinations, delusions, and paranoia. Patient has fair insight judgment and impulse control.  Past Medical History  Diagnosis Date  . Diabetes mellitus   . Anxiety   . Hypertension   . Hyperlipemia   . Mental disorder   . Schizoaffective disorder   . Impulsive control disorder  . Pancreatitis   . Otitis media   . Cancer   . HOH (hard of hearing)   . Tourette's disease   . Mental retardation     Past Surgical History  Procedure Laterality Date  . 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 High blood pressure Sister   . High Cholesterol Sister   . Heart disease Mother   . Stroke Mother     Social History:  reports that he has never smoked. He has never used smokeless tobacco. He reports that he does not drink alcohol or use illicit drugs.  Allergies: No Known Allergies  Medications: I have reviewed the patient's current medications.  Results for orders placed during the hospital encounter of 05/18/12  (from the past 48 hour(s))  URINE RAPID DRUG SCREEN (HOSP PERFORMED)     Status: None   Collection Time    05/18/12 12:30 AM      Result Value Range   Opiates NONE DETECTED  NONE DETECTED   Cocaine NONE DETECTED  NONE DETECTED   Benzodiazepines NONE DETECTED  NONE DETECTED   Amphetamines NONE DETECTED  NONE DETECTED   Tetrahydrocannabinol NONE DETECTED  NONE DETECTED   Barbiturates NONE DETECTED  NONE DETECTED   Comment:            DRUG SCREEN FOR MEDICAL PURPOSES     ONLY.  IF CONFIRMATION IS NEEDED     FOR ANY PURPOSE, NOTIFY LAB     WITHIN 5 DAYS.                LOWEST DETECTABLE LIMITS     FOR URINE DRUG SCREEN     Drug Class       Cutoff (ng/mL)     Amphetamine      1000     Barbiturate      200     Benzodiazepine   200     Tricyclics       300     Opiates          300     Cocaine          300     THC  50  ACETAMINOPHEN LEVEL     Status: None   Collection Time    05/18/12 12:49 AM      Result Value Range   Acetaminophen (Tylenol), Serum <15.0  10 - 30 ug/mL   Comment:            THERAPEUTIC CONCENTRATIONS VARY     SIGNIFICANTLY. A RANGE OF 10-30     ug/mL MAY BE AN EFFECTIVE     CONCENTRATION FOR MANY PATIENTS.     HOWEVER, SOME ARE BEST TREATED     AT CONCENTRATIONS OUTSIDE THIS     RANGE.     ACETAMINOPHEN CONCENTRATIONS     >150 ug/mL AT 4 HOURS AFTER     INGESTION AND >50 ug/mL AT 12     HOURS AFTER INGESTION ARE     OFTEN ASSOCIATED WITH TOXIC     REACTIONS.  CBC     Status: None   Collection Time    05/18/12 12:49 AM      Result Value Range   WBC 6.2  4.0 - 10.5 K/uL   RBC 5.31  4.22 - 5.81 MIL/uL   Hemoglobin 15.4  13.0 - 17.0 g/dL   HCT 16.1  09.6 - 04.5 %   MCV 84.9  78.0 - 100.0 fL   MCH 29.0  26.0 - 34.0 pg   MCHC 34.1  30.0 - 36.0 g/dL   RDW 40.9  81.1 - 91.4 %   Platelets 238  150 - 400 K/uL  COMPREHENSIVE METABOLIC PANEL     Status: Abnormal   Collection Time    05/18/12 12:49 AM      Result Value Range   Sodium 138  135 -  145 mEq/L   Potassium 3.6  3.5 - 5.1 mEq/L   Chloride 103  96 - 112 mEq/L   CO2 27  19 - 32 mEq/L   Glucose, Bld 90  70 - 99 mg/dL   BUN 19  6 - 23 mg/dL   Creatinine, Ser 7.82 (*) 0.50 - 1.35 mg/dL   Calcium 9.7  8.4 - 95.6 mg/dL   Total Protein 7.3  6.0 - 8.3 g/dL   Albumin 3.7  3.5 - 5.2 g/dL   AST 19  0 - 37 U/L   ALT 23  0 - 53 U/L   Alkaline Phosphatase 92  39 - 117 U/L   Total Bilirubin 0.2 (*) 0.3 - 1.2 mg/dL   GFR calc non Af Amer 39 (*) >90 mL/min   GFR calc Af Amer 46 (*) >90 mL/min   Comment:            The eGFR has been calculated     using the CKD EPI equation.     This calculation has not been     validated in all clinical     situations.     eGFR's persistently     <90 mL/min signify     possible Chronic Kidney Disease.  ETHANOL     Status: None   Collection Time    05/18/12 12:49 AM      Result Value Range   Alcohol, Ethyl (B) <11  0 - 11 mg/dL   Comment:            LOWEST DETECTABLE LIMIT FOR     SERUM ALCOHOL IS 11 mg/dL     FOR MEDICAL PURPOSES ONLY  SALICYLATE LEVEL     Status: Abnormal   Collection Time    05/18/12 12:49  AM      Result Value Range   Salicylate Lvl <2.0 (*) 2.8 - 20.0 mg/dL    No results found.  Positive for aggressive behavior, behavior problems and learning difficulty Blood pressure 117/78, pulse 68, temperature 97.6 F (36.4 C), temperature source Oral, resp. rate 18, SpO2 100.00%.   Assessment/Plan: Mental retardation unspecified Behavioral problems at group home  Recommendation: Patient will be referred back to his placement and to followup with outpatient psychiatric services including medication management. Patient has been followed by Dr. Penelope Galas psychiatrist who provided Seroquel as needed for treatment, which seems to be helping to calm him down. No medication changes made during this visit.    ,Shawn R. 05/18/2012, 11:26 AM

## 2012-05-19 NOTE — ED Provider Notes (Signed)
Medical screening examination/treatment/procedure(s) were performed by non-physician practitioner and as supervising physician I was immediately available for consultation/collaboration.    , MD 05/19/12 2243 

## 2012-06-24 ENCOUNTER — Other Ambulatory Visit: Payer: Self-pay | Admitting: Nephrology

## 2012-06-27 ENCOUNTER — Encounter (HOSPITAL_COMMUNITY): Payer: Self-pay | Admitting: Emergency Medicine

## 2012-06-27 ENCOUNTER — Encounter (HOSPITAL_COMMUNITY): Payer: Self-pay

## 2012-06-27 ENCOUNTER — Emergency Department (HOSPITAL_COMMUNITY)
Admission: EM | Admit: 2012-06-27 | Discharge: 2012-06-29 | Payer: Medicare Other | Attending: Emergency Medicine | Admitting: Emergency Medicine

## 2012-06-27 ENCOUNTER — Emergency Department (HOSPITAL_COMMUNITY)
Admission: EM | Admit: 2012-06-27 | Discharge: 2012-06-27 | Disposition: A | Discharge status: Nursing Home (Includes ICF) | Payer: Medicare Other | Source: Home / Self Care | Attending: Emergency Medicine | Admitting: Emergency Medicine

## 2012-06-27 DIAGNOSIS — K3184 Gastroparesis: Secondary | ICD-10-CM | POA: Insufficient documentation

## 2012-06-27 DIAGNOSIS — I10 Essential (primary) hypertension: Secondary | ICD-10-CM

## 2012-06-27 DIAGNOSIS — K219 Gastro-esophageal reflux disease without esophagitis: Secondary | ICD-10-CM | POA: Insufficient documentation

## 2012-06-27 DIAGNOSIS — F639 Impulse disorder, unspecified: Secondary | ICD-10-CM | POA: Insufficient documentation

## 2012-06-27 DIAGNOSIS — F79 Unspecified intellectual disabilities: Secondary | ICD-10-CM | POA: Insufficient documentation

## 2012-06-27 DIAGNOSIS — F411 Generalized anxiety disorder: Secondary | ICD-10-CM | POA: Insufficient documentation

## 2012-06-27 DIAGNOSIS — R45851 Suicidal ideations: Secondary | ICD-10-CM | POA: Insufficient documentation

## 2012-06-27 DIAGNOSIS — Z8719 Personal history of other diseases of the digestive system: Secondary | ICD-10-CM | POA: Insufficient documentation

## 2012-06-27 DIAGNOSIS — R4689 Other symptoms and signs involving appearance and behavior: Secondary | ICD-10-CM | POA: Diagnosis present

## 2012-06-27 DIAGNOSIS — H9190 Unspecified hearing loss, unspecified ear: Secondary | ICD-10-CM | POA: Insufficient documentation

## 2012-06-27 DIAGNOSIS — E119 Type 2 diabetes mellitus without complications: Secondary | ICD-10-CM

## 2012-06-27 DIAGNOSIS — Z7982 Long term (current) use of aspirin: Secondary | ICD-10-CM | POA: Insufficient documentation

## 2012-06-27 DIAGNOSIS — Z8659 Personal history of other mental and behavioral disorders: Secondary | ICD-10-CM | POA: Insufficient documentation

## 2012-06-27 DIAGNOSIS — Z859 Personal history of malignant neoplasm, unspecified: Secondary | ICD-10-CM | POA: Insufficient documentation

## 2012-06-27 DIAGNOSIS — R7402 Elevation of levels of lactic acid dehydrogenase (LDH): Secondary | ICD-10-CM | POA: Insufficient documentation

## 2012-06-27 DIAGNOSIS — K5909 Other constipation: Secondary | ICD-10-CM

## 2012-06-27 DIAGNOSIS — IMO0002 Reserved for concepts with insufficient information to code with codable children: Secondary | ICD-10-CM | POA: Insufficient documentation

## 2012-06-27 DIAGNOSIS — F911 Conduct disorder, childhood-onset type: Secondary | ICD-10-CM | POA: Insufficient documentation

## 2012-06-27 DIAGNOSIS — N2889 Other specified disorders of kidney and ureter: Secondary | ICD-10-CM | POA: Insufficient documentation

## 2012-06-27 DIAGNOSIS — Z794 Long term (current) use of insulin: Secondary | ICD-10-CM | POA: Insufficient documentation

## 2012-06-27 DIAGNOSIS — E876 Hypokalemia: Secondary | ICD-10-CM | POA: Insufficient documentation

## 2012-06-27 DIAGNOSIS — E1149 Type 2 diabetes mellitus with other diabetic neurological complication: Secondary | ICD-10-CM | POA: Insufficient documentation

## 2012-06-27 DIAGNOSIS — R112 Nausea with vomiting, unspecified: Secondary | ICD-10-CM | POA: Insufficient documentation

## 2012-06-27 DIAGNOSIS — F259 Schizoaffective disorder, unspecified: Secondary | ICD-10-CM | POA: Insufficient documentation

## 2012-06-27 DIAGNOSIS — R7401 Elevation of levels of liver transaminase levels: Secondary | ICD-10-CM | POA: Insufficient documentation

## 2012-06-27 DIAGNOSIS — E785 Hyperlipidemia, unspecified: Secondary | ICD-10-CM | POA: Insufficient documentation

## 2012-06-27 DIAGNOSIS — K5900 Constipation, unspecified: Secondary | ICD-10-CM | POA: Insufficient documentation

## 2012-06-27 DIAGNOSIS — R4789 Other speech disturbances: Secondary | ICD-10-CM | POA: Insufficient documentation

## 2012-06-27 DIAGNOSIS — Z8669 Personal history of other diseases of the nervous system and sense organs: Secondary | ICD-10-CM | POA: Insufficient documentation

## 2012-06-27 DIAGNOSIS — Z79899 Other long term (current) drug therapy: Secondary | ICD-10-CM | POA: Insufficient documentation

## 2012-06-27 DIAGNOSIS — R45850 Homicidal ideations: Secondary | ICD-10-CM | POA: Insufficient documentation

## 2012-06-27 DIAGNOSIS — R109 Unspecified abdominal pain: Secondary | ICD-10-CM | POA: Insufficient documentation

## 2012-06-27 DIAGNOSIS — F29 Unspecified psychosis not due to a substance or known physiological condition: Secondary | ICD-10-CM | POA: Insufficient documentation

## 2012-06-27 DIAGNOSIS — F489 Nonpsychotic mental disorder, unspecified: Secondary | ICD-10-CM | POA: Insufficient documentation

## 2012-06-27 LAB — COMPREHENSIVE METABOLIC PANEL
AST: 23 U/L (ref 0–37)
Albumin: 4.1 g/dL (ref 3.5–5.2)
Alkaline Phosphatase: 96 U/L (ref 39–117)
BUN: 15 mg/dL (ref 6–23)
Creatinine, Ser: 1.73 mg/dL — ABNORMAL HIGH (ref 0.50–1.35)
Potassium: 3.7 mEq/L (ref 3.5–5.1)
Total Protein: 8.1 g/dL (ref 6.0–8.3)

## 2012-06-27 LAB — SALICYLATE LEVEL: Salicylate Lvl: <2 mg/dL — ABNORMAL LOW (ref 2.8–20.0)

## 2012-06-27 LAB — CBC
HCT: 47.1 % (ref 39.0–52.0)
MCHC: 34.8 g/dL (ref 30.0–36.0)
Platelets: 303 10*3/uL (ref 150–400)
Platelets: 294 10*3/uL (ref 150–400)
RBC: 5.66 MIL/uL (ref 4.22–5.81)
RDW: 14.5 % (ref 11.5–15.5)
WBC: 6.5 10*3/uL (ref 4.0–10.5)

## 2012-06-27 LAB — RAPID URINE DRUG SCREEN, HOSP PERFORMED
Benzodiazepines: NOT DETECTED
Cocaine: NOT DETECTED

## 2012-06-27 LAB — ETHANOL: Alcohol, Ethyl (B): <11 mg/dL (ref 0–11)

## 2012-06-27 LAB — ACETAMINOPHEN LEVEL: Acetaminophen (Tylenol), Serum: <15 ug/mL (ref 10–30)

## 2012-06-27 MED ORDER — ASPIRIN 81 MG PO CHEW: 81.0000 mg | CHEWABLE_TABLET | Freq: Every day | ORAL | Status: DC

## 2012-06-27 MED ORDER — DICYCLOMINE HCL 10 MG PO CAPS: 20.0000 mg | ORAL_CAPSULE | Freq: Two times a day (BID) | ORAL | Status: DC

## 2012-06-27 MED ORDER — AMLODIPINE BESYLATE 5 MG PO TABS: 5.0000 mg | ORAL_TABLET | Freq: Every day | ORAL | Status: DC

## 2012-06-27 MED ORDER — METOPROLOL TARTRATE 25 MG PO TABS: 12.5000 mg | ORAL_TABLET | Freq: Two times a day (BID) | ORAL | Status: DC

## 2012-06-27 MED ORDER — LORAZEPAM 1 MG PO TABS: 1.0000 mg | ORAL_TABLET | Freq: Three times a day (TID) | ORAL | Status: DC | PRN

## 2012-06-27 MED ORDER — QUETIAPINE FUMARATE 300 MG PO TABS: 600.0000 mg | ORAL_TABLET | Freq: Every day | ORAL | Status: DC

## 2012-06-27 MED ORDER — ZOLPIDEM TARTRATE 5 MG PO TABS: 5.0000 mg | ORAL_TABLET | Freq: Every evening | ORAL | Status: DC | PRN

## 2012-06-27 MED ORDER — LORATADINE 10 MG PO TABS: 10.0000 mg | ORAL_TABLET | Freq: Once | ORAL | Status: DC

## 2012-06-27 MED ORDER — QUETIAPINE FUMARATE 100 MG PO TABS: 100.0000 mg | ORAL_TABLET | Freq: Two times a day (BID) | ORAL | Status: DC

## 2012-06-27 MED ORDER — METOCLOPRAMIDE HCL 10 MG PO TABS: 10.0000 mg | ORAL_TABLET | Freq: Four times a day (QID) | ORAL | Status: DC | PRN

## 2012-06-27 MED ORDER — ACETAMINOPHEN 325 MG PO TABS: 650.0000 mg | ORAL_TABLET | ORAL | Status: DC | PRN

## 2012-06-27 MED ORDER — PANTOPRAZOLE SODIUM 40 MG PO TBEC: 40.0000 mg | DELAYED_RELEASE_TABLET | Freq: Every day | ORAL | Status: DC

## 2012-06-27 MED ORDER — VITAMIN D3 25 MCG (1000 UNIT) PO TABS: 1000.0000 [IU] | ORAL_TABLET | Freq: Every day | ORAL | Status: DC

## 2012-06-27 MED ORDER — IBUPROFEN 600 MG PO TABS: 600.0000 mg | ORAL_TABLET | Freq: Three times a day (TID) | ORAL | Status: DC | PRN

## 2012-06-27 MED ORDER — VITAMIN B-12 1000 MCG PO TABS: 1000.0000 ug | ORAL_TABLET | Freq: Every day | ORAL | Status: DC

## 2012-06-27 MED ORDER — BUPROPION HCL ER (XL) 300 MG PO TB24: 300.0000 mg | ORAL_TABLET | Freq: Every day | ORAL | Status: DC

## 2012-06-27 MED ORDER — ONDANSETRON HCL 4 MG PO TABS: 4.0000 mg | ORAL_TABLET | Freq: Three times a day (TID) | ORAL | Status: DC | PRN

## 2012-06-27 MED ORDER — SIMVASTATIN 10 MG PO TABS: 10.0000 mg | ORAL_TABLET | Freq: Every day | ORAL | Status: DC

## 2012-06-27 NOTE — ED Notes (Signed)
Pt here with [employer], IVC papers,  Was here earlier today for medical clearance and then was discharged back to the group home, [employer] was called out tonight because he was threatening to kill himself with a gun, [employer] took out IVC papers.

## 2012-06-27 NOTE — BHH Counselor (Signed)
Patient evaluated by Julieanne Cotton. Per Josephine's recommendations, patient will be discharged home.   Contacted patient's group home  [phone] to speak to staff regarding patients disposition; no answer; left a voicemail.   Also contacted patients caregiver at the group home Larkin Ina [phone]; left a voicemail.  Contacted patients legal guardian/sister, Tylan Kinn [phone]. She was informed of patient's disposition to discharge back to group home. Informed her that group home staff had been contacted but no return calls at this time. Asked for an alternative #. She sts that she will call and have someone call regarding his disposition and/or pick up arrangements from the ED.   Patient remains in the ED awaiting pick up from group home staff or sister/legal guardian.

## 2012-06-27 NOTE — Consult Note (Signed)
Reason for Consult : Aggression Referring Physician: [name] is an 52 y.o. male.  HPI: Male AA 52 years old brought in from his group home for agitation and some aggressive behavior.  Patient tossed his medication across his room and the group home staff brought him for evaluation.  Patient answered all of my question by saying his mother is not visiting him and she does not say bye to him.  Patient's mother is deceased and he attended the funeral but has not yet accepted the fact that she is no longer life.  He, every now and then gets angry that his mom is not visiting him.  He reports to this Clinical research associate that he want his "mama" to visit.  Here in the ER, he is calm and cooperative with his arms clutched across his chest.  He denies SI/HI although he may not quite understand fully well the question.  My recommendation is to d/c him home to the group home.  He does not need medication changes at this time.   Past Medical History  Diagnosis Date  . Diabetes mellitus   . Anxiety   . Hypertension   . Hyperlipemia   . Mental disorder   . Schizoaffective disorder   . Impulsive control disorder  . Pancreatitis   . Otitis media   . Cancer   . HOH (hard of hearing)   . Tourette's disease   . Mental retardation     Past Surgical History  Procedure Laterality Date  . 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 High blood pressure Sister   . High Cholesterol Sister   . Heart disease Mother   . Stroke Mother     Social History:  reports that he has never smoked. He has never used smokeless tobacco. He reports that he does not drink alcohol or use illicit drugs.  Allergies: No Known Allergies  Medications: I have reviewed the patient's current medications.  Results for orders placed during the hospital encounter of 06/27/12 (from the past 48 hour(s))  ACETAMINOPHEN LEVEL     Status: None   Collection Time   06/27/12 12:21 PM      Result Value Range   Acetaminophen (Tylenol), Serum <15.0  10 - 30 ug/mL   Comment:            THERAPEUTIC CONCENTRATIONS VARY     SIGNIFICANTLY. A RANGE OF 10-30     ug/mL MAY BE AN EFFECTIVE     CONCENTRATION FOR MANY PATIENTS.     HOWEVER, SOME ARE BEST TREATED     AT CONCENTRATIONS OUTSIDE THIS     RANGE.     ACETAMINOPHEN CONCENTRATIONS     >150 ug/mL AT 4 HOURS AFTER     INGESTION AND >50 ug/mL AT 12     HOURS AFTER INGESTION ARE     OFTEN ASSOCIATED WITH TOXIC     REACTIONS.  CBC     Status: None   Collection Time    06/27/12 12:21 PM      Result Value Range   WBC 4.9  4.0 - 10.5 K/uL   RBC 5.54  4.22 - 5.81 MIL/uL   Hemoglobin 16.4  13.0 - 17.0 g/dL   HCT 81.1  91.4 - 78.2 %   MCV 85.0  78.0 - 100.0 fL   MCH 29.6  26.0 - 34.0  pg   MCHC 34.8  30.0 - 36.0 g/dL   RDW 16.1  09.6 - 04.5 %   Platelets 303  150 - 400 K/uL  COMPREHENSIVE METABOLIC PANEL     Status: Abnormal   Collection Time    06/27/12 12:21 PM      Result Value Range   Sodium 141  135 - 145 mEq/L   Potassium 3.7  3.5 - 5.1 mEq/L   Chloride 104  96 - 112 mEq/L   CO2 25  19 - 32 mEq/L   Glucose, Bld 135 (*) 70 - 99 mg/dL   BUN 15  6 - 23 mg/dL   Creatinine, Ser 4.09 (*) 0.50 - 1.35 mg/dL   Calcium 9.6  8.4 - 81.1 mg/dL   Total Protein 8.1  6.0 - 8.3 g/dL   Albumin 4.1  3.5 - 5.2 g/dL   AST 23  0 - 37 U/L   ALT 23  0 - 53 U/L   Alkaline Phosphatase 96  39 - 117 U/L   Total Bilirubin 0.3  0.3 - 1.2 mg/dL   GFR calc non Af Amer 44 (*) >90 mL/min   GFR calc Af Amer 51 (*) >90 mL/min   Comment:            The eGFR has been calculated     using the CKD EPI equation.     This calculation has not been     validated in all clinical     situations.     eGFR's persistently     <90 mL/min signify     possible Chronic Kidney Disease.  Shawn Baird     Status: None   Collection Time    06/27/12 12:21 PM      Result Value Range   Alcohol, Ethyl (B) <11  0 - 11 mg/dL   Comment:             LOWEST DETECTABLE LIMIT FOR     SERUM ALCOHOL IS 11 mg/dL     FOR MEDICAL PURPOSES ONLY  SALICYLATE LEVEL     Status: Abnormal   Collection Time    06/27/12 12:21 PM      Result Value Range   Salicylate Lvl <2.0 (*) 2.8 - 20.0 mg/dL  URINE RAPID DRUG SCREEN (HOSP PERFORMED)     Status: None   Collection Time    06/27/12 12:57 PM      Result Value Range   Opiates NONE DETECTED  NONE DETECTED   Cocaine NONE DETECTED  NONE DETECTED   Benzodiazepines NONE DETECTED  NONE DETECTED   Amphetamines NONE DETECTED  NONE DETECTED   Tetrahydrocannabinol NONE DETECTED  NONE DETECTED   Barbiturates NONE DETECTED  NONE DETECTED   Comment:            DRUG SCREEN FOR MEDICAL PURPOSES     ONLY.  IF CONFIRMATION IS NEEDED     FOR ANY PURPOSE, NOTIFY LAB     WITHIN 5 DAYS.                LOWEST DETECTABLE LIMITS     FOR URINE DRUG SCREEN     Drug Class       Cutoff (ng/mL)     Amphetamine      1000     Barbiturate      200     Benzodiazepine   200     Tricyclics       300     Opiates  300     Cocaine          300     THC              50    No results found.  Review of Systems  Unable to perform ROS: other   Blood pressure 142/99, pulse 96, temperature 97.6 F (36.4 C), temperature source Oral, resp. rate 18, SpO2 100.00%. Physical Exam : Patient does not want to be touched or bothered at this time.  He is angry his mother did not come to visit or say bye to him.  Patient is well known to [facility] with hx of MR.  Assessment/Plan:  We will send him back to  His group home.  Patient does not meet criteria for admission. Group home staff is willing to take patient back.   Dahlia Byes, C  PMHNP-Bc  06/27/2012, 5:06 PM     I am agreed with the findings and involve in the treatment plan.

## 2012-06-27 NOTE — BH Assessment (Signed)
Assessment Note   Shawn Baird is an 52 y.o. male. Patient was sent here to [facility] from [facility]. Patient is here under IVC and papers were taken out by [employer]. She is a caregiver for patient at his current group home.   Writer contacted patients group home  [phone] to speak with Dara for collateral information. No answer at that #. Called next contact number Larkin Ina [phone], care giver at group home. Patient is diagnosed with Schizoaffective Disorder, Mental Retardation, Impulsive issues. Apparently today patient became irritated, angry, and upset.  Sts that patient has on-going issues with the death of his mother yrs ago. Sts that the anniversary of her death is approaching. Patient has difficulty understanding death often stating, "I am mad at my mother for not saying goodbye and never coming to see me".  Verlee Monte also explains that patient is often irritated by another resident that has multiple behavioral problems. This other residents issues causes Mr. Shawn Baird to often become upset and irritable. Sts they are trying to get this resident out of the group home but this is not a quick process and will take some time. Patient is typically compliant with medications. However, today he refused to take all medications including PRN medications. Patient threw his medications on the floor and across the kitchen.   Today patient denies current SI, HI, and AVH's. According to [facility], patient made threats to shoot himself with a gun. She says that patient does not have access to guns. Writer asked patient about these comments to shoot himself and he reports that he was anger with another resident.  He says that the other resident  "Get on my nerves". Per ED, notes patient has a history of admissions to [facility] for similar episodes. Apparently, in the past he has became just as angry often triggered by other residents, family, his mothers death, etc.  Patient reports a previous hospitalization at [facility] in [place]. Patient also receives outpatient treatment with Dr. Penelope Galas and participates in a day program "PSR".   Dori White informed that patient would be evaluated and recommendations for level of care would be given. She sts, that if patient is discharged the group home is more than willing to take him back.     Axis I: Mood Disorder NOS Axis II: Deferred Axis III:  Past Medical History  Diagnosis Date  . Diabetes mellitus   . Anxiety   . Hypertension   . Hyperlipemia   . Mental disorder   . Schizoaffective disorder   . Impulsive control disorder  . Pancreatitis   . Otitis media   . Cancer   . HOH (hard of hearing)   . Tourette's disease   . Mental retardation    Axis IV: other psychosocial or environmental problems and problems related to social environment Axis V: 31-40 impairment in reality testing  Past Medical History:  Past Medical History  Diagnosis Date  . Diabetes mellitus   . Anxiety   . Hypertension   . Hyperlipemia   . Mental disorder   . Schizoaffective disorder   . Impulsive control disorder  . Pancreatitis   . Otitis media   . Cancer   . HOH (hard of hearing)   . Tourette's disease   . Mental retardation     Past Surgical History  Procedure Laterality Date  . 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 High blood pressure Sister   . High Cholesterol Sister   . Heart disease Mother   . Stroke Mother     Social History:  reports that he has never smoked. He has never used smokeless tobacco. He reports that he does not drink alcohol or use illicit drugs.  Additional Social History:  Alcohol / Drug Use Pain Medications: SEE MAR Prescriptions: SEE MAR Over the Counter: SEE MAR History of alcohol / drug use?: No history of alcohol / drug abuse  CIWA: CIWA-Ar BP: 142/99 mmHg Pulse Rate: 96 COWS:    Allergies: No Known Allergies  Home  Medications:  (Not in a hospital admission)  OB/GYN Status:  No LMP for male patient.  General Assessment Data Location of Assessment: [facility] ED Living Arrangements: Other (Comment) (lives in a group home) Can pt return to current living arrangement?: Yes Admission Status: Involuntary Is patient capable of signing voluntary admission?: Yes Transfer from: Acute Hospital Referral Source: Self/Family/Friend     Risk to self Suicidal Ideation: No Suicidal Intent: No Is patient at risk for suicide?: No Suicidal Plan?: No Access to Means: No What has been your use of drugs/alcohol within the last 12 months?:  (n/a) Previous Attempts/Gestures: No How many times?:  (0) Other Self Harm Risks:  (n/a) Triggers for Past Attempts:  (no previous attempts and/or gestures) Intentional Self Injurious Behavior: None Family Suicide History: No Recent stressful life event(s): Other (Comment) ("People at that group home get on my nerves") Persecutory voices/beliefs?: No Depression: Yes Depression Symptoms: Feeling angry/irritable Substance abuse history and/or treatment for substance abuse?: No Suicide prevention information given to non-admitted patients: Not applicable  Risk to Others Homicidal Ideation: No Thoughts of Harm to Others: No Current Homicidal Intent: No Current Homicidal Plan: No Access to Homicidal Means: No Identified Victim:  (n/a) History of harm to others?: No Assessment of Violence: None Noted Violent Behavior Description:  (patient calm and coopertive) Does patient have access to weapons?: No Criminal Charges Pending?: No Does patient have a court date: No  Psychosis Hallucinations: None noted Delusions: None noted  Mental Status Report Appear/Hygiene: Disheveled Eye Contact: Fair Motor Activity: Freedom of movement Speech: Logical/coherent Level of Consciousness: Alert Mood: Anxious;Irritable Affect: Anxious;Apprehensive Anxiety Level: None Thought  Processes: Coherent;Relevant Judgement: Impaired Orientation: Person;Time;Place;Situation Obsessive Compulsive Thoughts/Behaviors: None  Cognitive Functioning Concentration: Decreased Memory: Recent Intact;Remote Impaired IQ: Below Average Level of Function:  (patient has a MR diagnosis) Insight: Poor Impulse Control: Poor Appetite: Fair Weight Loss:  (none reported) Weight Gain:  (none reported) Sleep: No Change Vegetative Symptoms: Staying in bed;Not bathing;Decreased grooming  ADLScreening [facility] Assessment Services) Patient's cognitive ability adequate to safely complete daily activities?: Yes Patient able to express need for assistance with ADLs?: Yes Independently performs ADLs?: Yes (appropriate for developmental age)  Abuse/Neglect [facility] Physical Abuse: Denies Verbal Abuse: Denies Sexual Abuse: Denies  Prior Inpatient Therapy Prior Inpatient Therapy: Yes Prior Therapy Dates:  (past) Prior Therapy Facilty/Provider(s):  [facility]) Reason for Treatment:  (behavioral problems)  Prior Outpatient Therapy Prior Outpatient Therapy: Yes Prior Therapy Dates:  (currently) Prior Therapy Facilty/Provider(s):  [facility]) Reason for Treatment:  (medication managment)  ADL Screening (condition at time of admission) Patient's cognitive ability adequate to safely complete daily activities?: Yes Patient able to express need for assistance with ADLs?: Yes Independently performs ADLs?: Yes (appropriate for developmental age) Weakness of Legs: None Weakness of Arms/Hands: None  Home Assistive Devices/Equipment Home Assistive Devices/Equipment: None  Abuse/Neglect Assessment (Assessment to be complete while patient is alone) Physical Abuse: Denies Verbal Abuse: Denies Sexual Abuse: Denies Exploitation of patient/patient's resources: Denies Self-Neglect: Denies Values / Beliefs Cultural Requests During Hospitalization: None Spiritual Requests During  Hospitalization: None   Advance Directives (For Healthcare) Advance Directive: Patient does not have advance directive Nutrition Screen- [facility] Adult/[facility] Patient's home diet: Regular  Additional Information 1:1 In Past 12 Months?: No CIRT Risk: No Elopement Risk: No Does patient have medical clearance?: Yes     Disposition:  Disposition Initial Assessment Completed for this Encounter: Yes Disposition of Patient: Other dispositions (Disposition pending evaluation by Julieanne Cotton)  On Site Evaluation by:   Reviewed with Physician:     Octaviano Batty 06/27/2012 2:24 PM

## 2012-06-27 NOTE — ED Provider Notes (Signed)
History     CSN: [id number]  Arrival date & time 06/27/12  1155   First MD Initiated Contact with Patient 06/27/12 1209      Chief Complaint  Patient presents with  . Medical Clearance    (Consider location/radiation/quality/duration/timing/severity/associated sxs/prior treatment) HPI  Patient is a 52 year old male past medical history significant for mental retardation, anxiety, schizoaffective disorder, hypertension, pancreatitis, mental disorder presenting from group home IVC'd for aggressive behavior including throwing things and homicidal ideations. Patient states he wishes he could take the police officers gun so he was able to kill people and himself. Patient was brought in by [facility]. Denies auditory/visual hallucinations. Denies CP, SOB, abdominal pain.   Past Medical History  Diagnosis Date  . Diabetes mellitus   . Anxiety   . Hypertension   . Hyperlipemia   . Mental disorder   . Schizoaffective disorder   . Impulsive control disorder  . Pancreatitis   . Otitis media   . Cancer   . HOH (hard of hearing)   . Tourette's disease   . Mental retardation     Past Surgical History  Procedure Laterality Date  . 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 High blood pressure Sister   . High Cholesterol Sister   . Heart disease Mother   . Stroke Mother     History  Substance Use Topics  . Smoking status: Never Smoker   . Smokeless tobacco: Never Used  . Alcohol Use: No      Review of Systems  Constitutional: Negative for fever and chills.  HENT: Negative for neck pain.   Eyes: Negative for pain.  Respiratory: Negative for shortness of breath.   Cardiovascular: Negative for chest pain.  Gastrointestinal: Negative for abdominal pain.  Musculoskeletal: Negative for back pain.  Skin: Negative.   Neurological: Negative for headaches.  Psychiatric/Behavioral: Positive for  suicidal ideas and agitation.    Allergies  Review of patient's allergies indicates no known allergies.  Home Medications   Current Outpatient Rx  Name  Route  Sig  Dispense  Refill  . amLODipine (NORVASC) 5 MG tablet   Oral   Take 5 mg by mouth daily.         Marland Kitchen aspirin EC 81 MG tablet   Oral   Take 81 mg by mouth daily.         Marland Kitchen buPROPion (WELLBUTRIN XL) 150 MG 24 hr tablet   Oral   Take 300 mg by mouth daily.         . cholecalciferol (VITAMIN D) 1000 UNITS tablet   Oral   Take 1,000 Units by mouth daily.         Marland Kitchen dicyclomine (BENTYL) 20 MG tablet   Oral   Take 20 mg by mouth 2 (two) times daily.          . insulin regular (HUMULIN R) 100 units/mL injection   Subcutaneous   Inject 2-12 Units into the skin 3 (three) times daily before meals. Sliding scale         . loratadine (CLARITIN) 10 MG tablet   Oral   Take 10 mg by mouth daily.         . metoCLOPramide (REGLAN) 10 MG tablet   Oral   Take 10 mg by mouth 4 (four) times daily.         Marland Kitchen  metoprolol tartrate (LOPRESSOR) 25 MG tablet   Oral   Take 12.5 mg by mouth 2 (two) times daily.         Marland Kitchen omeprazole (PRILOSEC) 20 MG capsule   Oral   Take 20 mg by mouth daily.         . ondansetron (ZOFRAN-ODT) 4 MG disintegrating tablet   Oral   Take 4 mg by mouth every 8 (eight) hours as needed for nausea.         . polyethylene glycol (MIRALAX / GLYCOLAX) packet   Oral   Take 17 g by mouth daily.         . pravastatin (PRAVACHOL) 20 MG tablet   Oral   Take 20 mg by mouth at bedtime.          Marland Kitchen QUEtiapine (SEROQUEL) 100 MG tablet   Oral   Take 100 mg by mouth 2 (two) times daily.         . QUEtiapine (SEROQUEL) 200 MG tablet   Oral   Take 600 mg by mouth at bedtime.         . sennosides-docusate sodium (SENOKOT-S) 8.6-50 MG tablet   Oral   Take 1 tablet by mouth at bedtime as needed for constipation.         Marland Kitchen testosterone cypionate (DEPOTESTOTERONE CYPIONATE) 100  MG/ML injection   Intramuscular   Inject 100 mg into the muscle every 28 (twenty-eight) days. For IM use only         . traMADol (ULTRAM) 50 MG tablet   Oral   Take 50 mg by mouth 3 (three) times daily as needed for pain.         . vitamin B-12 (CYANOCOBALAMIN) 1000 MCG tablet   Oral   Take 1,000 mcg by mouth every morning.          . zolpidem (AMBIEN) 5 MG tablet   Oral   Take 5 mg by mouth at bedtime as needed for sleep.           BP 118/82  Pulse 76  Temp(Src) 97.3 F (36.3 C) (Oral)  Resp 24  SpO2 98%  Physical Exam  Constitutional: He is oriented to person, place, and time. He appears well-developed and well-nourished. No distress.  HENT:  Head: Normocephalic and atraumatic.  Mouth/Throat: Oropharynx is clear and moist.  Eyes: Conjunctivae are normal.  Neck: Neck supple.  Cardiovascular: Normal rate, regular rhythm and normal heart sounds.   Pulmonary/Chest: Effort normal and breath sounds normal.  Abdominal: Soft.  Neurological: He is alert and oriented to person, place, and time.  Skin: Skin is warm and dry. He is not diaphoretic.  Psychiatric: He has a normal mood and affect. He is agitated. He expresses homicidal and suicidal ideation. He expresses suicidal plans and homicidal plans.    ED Course  Procedures (including critical care time)  Labs Reviewed  COMPREHENSIVE METABOLIC PANEL - Abnormal; Notable for the following:    Glucose, Bld 135 (*)    Creatinine, Ser 1.73 (*)    GFR calc non Af Amer 44 (*)    GFR calc Af Amer 51 (*)    All other components within normal limits  SALICYLATE LEVEL - Abnormal; Notable for the following:    Salicylate Lvl <2.0 (*)    All other components within normal limits  ACETAMINOPHEN LEVEL  CBC  ETHANOL  URINE RAPID DRUG SCREEN (HOSP PERFORMED)   No results found.   1. Impulse control disorder  MDM  Patient presents to the ER with a number of risk factors for suicide for example the patient has a  history of mood disorder, impulse disorder, anxiety, and was brought in by [employer] from Group Home for aggressive behavior and suicidal ideations today. In addition the patient has a number of protective factors for example the patient does not appear to be psychotic. Current Plan is to have patient be evaluated by [facility] for further assessment on weather or not to be placed inpatient. Psych hold orders placed. ACT and telepsych consulted. Home medications ordered. Patient has been cleared to move to [facility] pending further assessment. Stable at time of shift change.          Jeannetta Ellis, PA-C 06/28/12 1527

## 2012-06-27 NOTE — ED Notes (Signed)
Pt was brought from group home pt was ivc for aggressive behavior and throwing things. Pt states that he wants a gun to kill everyone. Pt brought in by [facility].

## 2012-06-28 ENCOUNTER — Encounter (HOSPITAL_COMMUNITY): Payer: Self-pay | Admitting: Registered Nurse

## 2012-06-28 DIAGNOSIS — F911 Conduct disorder, childhood-onset type: Secondary | ICD-10-CM

## 2012-06-28 DIAGNOSIS — F411 Generalized anxiety disorder: Secondary | ICD-10-CM

## 2012-06-28 DIAGNOSIS — R4689 Other symptoms and signs involving appearance and behavior: Secondary | ICD-10-CM | POA: Diagnosis present

## 2012-06-28 LAB — RAPID URINE DRUG SCREEN, HOSP PERFORMED
Barbiturates: NOT DETECTED
Tetrahydrocannabinol: NOT DETECTED

## 2012-06-28 LAB — COMPREHENSIVE METABOLIC PANEL
ALT: 22 U/L (ref 0–53)
AST: 22 U/L (ref 0–37)
CO2: 25 mEq/L (ref 19–32)
Chloride: 102 mEq/L (ref 96–112)
GFR calc non Af Amer: 45 mL/min — ABNORMAL LOW (ref >90)
Sodium: 140 mEq/L (ref 135–145)
Total Bilirubin: 0.4 mg/dL (ref 0.3–1.2)

## 2012-06-28 LAB — GLUCOSE, CAPILLARY

## 2012-06-28 MED ORDER — ACETAMINOPHEN 325 MG PO TABS: 650.0000 mg | ORAL_TABLET | ORAL | Status: DC | PRN

## 2012-06-28 MED ORDER — LORAZEPAM 1 MG PO TABS
1.0000 mg | ORAL_TABLET | Freq: Once | ORAL | Status: AC
  Administered 2012-06-28: 1 mg via ORAL
  Filled 2012-06-28 (×2): qty 1

## 2012-06-28 MED ORDER — DIVALPROEX SODIUM 250 MG PO DR TAB
250.0000 mg | DELAYED_RELEASE_TABLET | Freq: Two times a day (BID) | ORAL | Status: DC
  Administered 2012-06-28 – 2012-06-29 (×3): 250 mg via ORAL
  Filled 2012-06-28 (×3): qty 1

## 2012-06-28 MED ORDER — ONDANSETRON HCL 4 MG PO TABS
4.0000 mg | ORAL_TABLET | Freq: Three times a day (TID) | ORAL | Status: DC | PRN
  Administered 2012-06-28: 4 mg via ORAL
  Filled 2012-06-28: qty 1

## 2012-06-28 MED ORDER — IBUPROFEN 600 MG PO TABS: 600.0000 mg | ORAL_TABLET | Freq: Three times a day (TID) | ORAL | Status: DC | PRN

## 2012-06-28 NOTE — ED Notes (Signed)
Pt given a sandwich and peanut butter crackers

## 2012-06-28 NOTE — Consult Note (Signed)
Reason for Consult:  Patient brought back in to the ED by group home with complaints of aggressive behavior Referring Physician: EDP  Shawn Baird is an 52 y.o. male.  HPI: Patient face to face interview.  Patient stating that staff is getting "on my nerves; always asking me to take out the trash; I'm not a garbage man.  They treat me like a child; I'm a grown man."  When asked patient about his mother he stated "she gets on my nerves.  When asked the last time he saw his mother patient stated "it's been a long time."  Patient denies SI "I don't want to hurt my self."  Denies HI stating "I don't want to hurt no body; never have."  No active psychosis noted at this time.  CSW spoke with group home staff and was informed that there were some staffing issues.    Past Medical History  Diagnosis Date  . Diabetes mellitus   . Anxiety   . Hypertension   . Hyperlipemia   . Mental disorder   . Schizoaffective disorder   . Impulsive control disorder  . Pancreatitis   . Otitis media   . Cancer   . HOH (hard of hearing)   . Tourette's disease   . Mental retardation     Past Surgical History  Procedure Laterality Date  . 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 High blood pressure Sister   . High Cholesterol Sister   . Heart disease Mother   . Stroke Mother     Social History:  reports that he has never smoked. He has never used smokeless tobacco. He reports that he does not drink alcohol or use illicit drugs.  Allergies: No Known Allergies  Medications: I have reviewed the patient's current medications.  Results for orders placed during the hospital encounter of 06/27/12 (from the past 48 hour(s))  CBC     Status: None   Collection Time    06/27/12 11:20 PM      Result Value Range   WBC 6.5  4.0 - 10.5 K/uL   RBC 5.66  4.22 - 5.81 MIL/uL   Hemoglobin 16.7  13.0 - 17.0 g/dL   HCT 16.1  09.6 - 04.5 %   MCV 84.1   78.0 - 100.0 fL   MCH 29.5  26.0 - 34.0 pg   MCHC 35.1  30.0 - 36.0 g/dL   RDW 40.9  81.1 - 91.4 %   Platelets 294  150 - 400 K/uL  COMPREHENSIVE METABOLIC PANEL     Status: Abnormal   Collection Time    06/27/12 11:20 PM      Result Value Range   Sodium 140  135 - 145 mEq/L   Potassium 3.7  3.5 - 5.1 mEq/L   Chloride 102  96 - 112 mEq/L   CO2 25  19 - 32 mEq/L   Glucose, Bld 125 (*) 70 - 99 mg/dL   BUN 15  6 - 23 mg/dL   Creatinine, Ser 7.82 (*) 0.50 - 1.35 mg/dL   Calcium 9.8  8.4 - 95.6 mg/dL   Total Protein 8.3  6.0 - 8.3 g/dL   Albumin 4.4  3.5 - 5.2 g/dL   AST 22  0 - 37 U/L   ALT 22  0 - 53 U/L   Alkaline Phosphatase 98  39 - 117 U/L  Total Bilirubin 0.4  0.3 - 1.2 mg/dL   GFR calc non Af Amer 45 (*) >90 mL/min   GFR calc Af Amer 52 (*) >90 mL/min   Comment:            The eGFR has been calculated     using the CKD EPI equation.     This calculation has not been     validated in all clinical     situations.     eGFR's persistently     <90 mL/min signify     possible Chronic Kidney Disease.  ETHANOL     Status: None   Collection Time    06/27/12 11:20 PM      Result Value Range   Alcohol, Ethyl (B) <11  0 - 11 mg/dL   Comment:            LOWEST DETECTABLE LIMIT FOR     SERUM ALCOHOL IS 11 mg/dL     FOR MEDICAL PURPOSES ONLY  URINE RAPID DRUG SCREEN (HOSP PERFORMED)     Status: None   Collection Time    06/28/12  2:01 AM      Result Value Range   Opiates NONE DETECTED  NONE DETECTED   Cocaine NONE DETECTED  NONE DETECTED   Benzodiazepines NONE DETECTED  NONE DETECTED   Amphetamines NONE DETECTED  NONE DETECTED   Tetrahydrocannabinol NONE DETECTED  NONE DETECTED   Barbiturates NONE DETECTED  NONE DETECTED   Comment:            DRUG SCREEN FOR MEDICAL PURPOSES     ONLY.  IF CONFIRMATION IS NEEDED     FOR ANY PURPOSE, NOTIFY LAB     WITHIN 5 DAYS.                LOWEST DETECTABLE LIMITS     FOR URINE DRUG SCREEN     Drug Class       Cutoff (ng/mL)      Amphetamine      1000     Barbiturate      200     Benzodiazepine   200     Tricyclics       300     Opiates          300     Cocaine          300     THC              50    No results found.  Review of Systems  Psychiatric/Behavioral: Positive for memory loss. Negative for depression, suicidal ideas, hallucinations and substance abuse. The patient is not nervous/anxious.   All other systems reviewed and are negative.   Blood pressure 152/94, pulse 95, temperature 98.5 F (36.9 C), temperature source Oral, resp. rate 18, SpO2 97.00%. Physical Exam  Constitutional: He appears well-developed and well-nourished.  HENT:  Head: Normocephalic and atraumatic.  Eyes: Pupils are equal, round, and reactive to light.  Neck: Normal range of motion. Neck supple.  Cardiovascular: Normal rate, regular rhythm and normal heart sounds.   Respiratory: Effort normal.  GI: Soft.  Musculoskeletal: Normal range of motion.  Neurological: He is alert.  Skin: Skin is warm and dry.  Psychiatric: His behavior is normal. His mood appears anxious. His speech is rapid and/or pressured. Cognition and memory are impaired. He expresses no homicidal and no suicidal ideation.    Assessment/Plan:  Consulted with Shawn Baird Recommendations:  Start patient  on Depakote 250 mg  now and 250 mg at bedtime.  Monitor the patient over night and discharge home in the am.  Shawn Baird 06/28/2012, 2:07 PM     I am agreed with the findings and involve in the treatment plan.

## 2012-06-28 NOTE — Progress Notes (Signed)
Per discussion with treatment team, patient to observed and reevaluated in the morning for disposition.   Catha Gosselin, LCSWA  [phone] 06/28/2012 1733pm

## 2012-06-28 NOTE — ED Provider Notes (Signed)
  Medical screening examination/treatment/procedure(s) were performed by non-physician practitioner and as supervising physician I was immediately available for consultation/collaboration.    Gerhard Munch, MD 06/28/12 1538

## 2012-06-28 NOTE — ED Provider Notes (Signed)
History     CSN: [id number]  Arrival date & time 06/27/12  2233   First MD Initiated Contact with Patient 06/28/12 [phone]      Chief Complaint  Patient presents with  . Medical Clearance    (Consider location/radiation/quality/duration/timing/severity/associated sxs/prior treatment) Patient is a 52 y.o. male presenting with mental health disorder. The history is provided by the police and medical records. The history is limited by the condition of the patient. No language interpreter was used.  Mental Health Problem Presenting symptoms: disorganized speech, homicidal ideas and suicide attempt   Patient accompanied by:  Law enforcement Degree of incapacity (severity):  Severe Onset quality:  Unable to specify Timing:  Constant Progression:  Unchanged Chronicity:  Recurrent Context: not alcohol use   Treatment compliance:  Unable to specify Relieved by:  Nothing Worsened by:  Nothing tried Ineffective treatments:  None tried Associated symptoms: no euphoric mood   Risk factors: hx of mental illness     Past Medical History  Diagnosis Date  . Diabetes mellitus   . Anxiety   . Hypertension   . Hyperlipemia   . Mental disorder   . Schizoaffective disorder   . Impulsive control disorder  . Pancreatitis   . Otitis media   . Cancer   . HOH (hard of hearing)   . Tourette's disease   . Mental retardation     Past Surgical History  Procedure Laterality Date  . 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 High blood pressure Sister   . High Cholesterol Sister   . Heart disease Mother   . Stroke Mother     History  Substance Use Topics  . Smoking status: Never Smoker   . Smokeless tobacco: Never Used  . Alcohol Use: No      Review of Systems  Unable to perform ROS Psychiatric/Behavioral: Positive for homicidal ideas.    Allergies  Review of patient's allergies indicates no known  allergies.  Home Medications   Current Outpatient Rx  Name  Route  Sig  Dispense  Refill  . amLODipine (NORVASC) 5 MG tablet   Oral   Take 5 mg by mouth daily.         Marland Kitchen aspirin EC 81 MG tablet   Oral   Take 81 mg by mouth daily.         Marland Kitchen buPROPion (WELLBUTRIN XL) 150 MG 24 hr tablet   Oral   Take 300 mg by mouth daily.         . cholecalciferol (VITAMIN D) 1000 UNITS tablet   Oral   Take 1,000 Units by mouth daily.         Marland Kitchen dicyclomine (BENTYL) 20 MG tablet   Oral   Take 20 mg by mouth 2 (two) times daily.          . insulin regular (HUMULIN R) 100 units/mL injection   Subcutaneous   Inject 2-12 Units into the skin 3 (three) times daily before meals. Sliding scale         . loratadine (CLARITIN) 10 MG tablet   Oral   Take 10 mg by mouth daily.         . metoCLOPramide (REGLAN) 10 MG tablet   Oral   Take 10 mg by mouth 4 (four) times daily.         . metoprolol tartrate (LOPRESSOR)  25 MG tablet   Oral   Take 12.5 mg by mouth 2 (two) times daily.         Marland Kitchen omeprazole (PRILOSEC) 20 MG capsule   Oral   Take 20 mg by mouth daily.         . ondansetron (ZOFRAN-ODT) 4 MG disintegrating tablet   Oral   Take 4 mg by mouth every 8 (eight) hours as needed for nausea.         . polyethylene glycol (MIRALAX / GLYCOLAX) packet   Oral   Take 17 g by mouth daily.         . pravastatin (PRAVACHOL) 20 MG tablet   Oral   Take 20 mg by mouth at bedtime.          Marland Kitchen QUEtiapine (SEROQUEL) 100 MG tablet   Oral   Take 100 mg by mouth 2 (two) times daily.         . QUEtiapine (SEROQUEL) 200 MG tablet   Oral   Take 600 mg by mouth at bedtime.         . sennosides-docusate sodium (SENOKOT-S) 8.6-50 MG tablet   Oral   Take 1 tablet by mouth at bedtime as needed for constipation.         Marland Kitchen testosterone cypionate (DEPOTESTOTERONE CYPIONATE) 100 MG/ML injection   Intramuscular   Inject 100 mg into the muscle every 28 (twenty-eight) days. For  IM use only         . traMADol (ULTRAM) 50 MG tablet   Oral   Take 50 mg by mouth 3 (three) times daily as needed for pain.         . vitamin B-12 (CYANOCOBALAMIN) 1000 MCG tablet   Oral   Take 1,000 mcg by mouth every morning.          . zolpidem (AMBIEN) 5 MG tablet   Oral   Take 5 mg by mouth at bedtime as needed for sleep.           BP 136/96  Pulse 91  Temp(Src) 98.4 F (36.9 C) (Oral)  Resp 20  SpO2 98%  Physical Exam  Constitutional: He appears well-developed and well-nourished. No distress.  HENT:  Head: Normocephalic and atraumatic.  Eyes: Conjunctivae are normal. Pupils are equal, round, and reactive to light.  Neck: Normal range of motion. Neck supple.  Cardiovascular: Normal rate, regular rhythm and intact distal pulses.   Pulmonary/Chest: Effort normal and breath sounds normal. He has no wheezes. He has no rales.  Abdominal: Soft. Bowel sounds are normal. There is no tenderness. There is no rebound and no guarding.  Musculoskeletal: Normal range of motion.  Neurological: He is alert. He has normal reflexes.  Skin: Skin is warm and dry.  Psychiatric:  psychotic    ED Course  Procedures (including critical care time)  Labs Reviewed  COMPREHENSIVE METABOLIC PANEL - Abnormal; Notable for the following:    Glucose, Bld 125 (*)    Creatinine, Ser 1.70 (*)    GFR calc non Af Amer 45 (*)    GFR calc Af Amer 52 (*)    All other components within normal limits  CBC  ETHANOL  URINE RAPID DRUG SCREEN (HOSP PERFORMED)   No results found.   No diagnosis found.    MDM  Under IVC for attempting to kill self with gun will send to psych ed         K -Rasch, MD 06/28/12 1610

## 2012-06-29 ENCOUNTER — Encounter (HOSPITAL_COMMUNITY): Payer: Self-pay | Admitting: Emergency Medicine

## 2012-06-29 ENCOUNTER — Emergency Department (HOSPITAL_COMMUNITY)
Admission: EM | Admit: 2012-06-29 | Discharge: 2012-06-29 | Payer: Medicare Other | Attending: Emergency Medicine | Admitting: Emergency Medicine

## 2012-06-29 DIAGNOSIS — E119 Type 2 diabetes mellitus without complications: Secondary | ICD-10-CM | POA: Insufficient documentation

## 2012-06-29 DIAGNOSIS — R300 Dysuria: Secondary | ICD-10-CM | POA: Insufficient documentation

## 2012-06-29 DIAGNOSIS — R61 Generalized hyperhidrosis: Secondary | ICD-10-CM | POA: Insufficient documentation

## 2012-06-29 DIAGNOSIS — Z794 Long term (current) use of insulin: Secondary | ICD-10-CM | POA: Insufficient documentation

## 2012-06-29 DIAGNOSIS — F639 Impulse disorder, unspecified: Secondary | ICD-10-CM | POA: Insufficient documentation

## 2012-06-29 DIAGNOSIS — F952 Tourette's disorder: Secondary | ICD-10-CM | POA: Insufficient documentation

## 2012-06-29 DIAGNOSIS — H9190 Unspecified hearing loss, unspecified ear: Secondary | ICD-10-CM | POA: Insufficient documentation

## 2012-06-29 DIAGNOSIS — F411 Generalized anxiety disorder: Secondary | ICD-10-CM | POA: Insufficient documentation

## 2012-06-29 DIAGNOSIS — Z8719 Personal history of other diseases of the digestive system: Secondary | ICD-10-CM | POA: Insufficient documentation

## 2012-06-29 DIAGNOSIS — R000 Tachycardia, unspecified: Secondary | ICD-10-CM | POA: Insufficient documentation

## 2012-06-29 DIAGNOSIS — F79 Unspecified intellectual disabilities: Secondary | ICD-10-CM | POA: Insufficient documentation

## 2012-06-29 DIAGNOSIS — Z859 Personal history of malignant neoplasm, unspecified: Secondary | ICD-10-CM | POA: Insufficient documentation

## 2012-06-29 DIAGNOSIS — F259 Schizoaffective disorder, unspecified: Secondary | ICD-10-CM | POA: Insufficient documentation

## 2012-06-29 DIAGNOSIS — Z79899 Other long term (current) drug therapy: Secondary | ICD-10-CM | POA: Insufficient documentation

## 2012-06-29 DIAGNOSIS — Z7982 Long term (current) use of aspirin: Secondary | ICD-10-CM | POA: Insufficient documentation

## 2012-06-29 DIAGNOSIS — R1084 Generalized abdominal pain: Secondary | ICD-10-CM | POA: Insufficient documentation

## 2012-06-29 DIAGNOSIS — I10 Essential (primary) hypertension: Secondary | ICD-10-CM | POA: Insufficient documentation

## 2012-06-29 DIAGNOSIS — R112 Nausea with vomiting, unspecified: Secondary | ICD-10-CM | POA: Insufficient documentation

## 2012-06-29 DIAGNOSIS — E785 Hyperlipidemia, unspecified: Secondary | ICD-10-CM | POA: Insufficient documentation

## 2012-06-29 LAB — COMPREHENSIVE METABOLIC PANEL
AST: 28 U/L (ref 0–37)
Albumin: 4.3 g/dL (ref 3.5–5.2)
Chloride: 100 mEq/L (ref 96–112)
Creatinine, Ser: 1.68 mg/dL — ABNORMAL HIGH (ref 0.50–1.35)
Total Bilirubin: 0.4 mg/dL (ref 0.3–1.2)
Total Protein: 8.7 g/dL — ABNORMAL HIGH (ref 6.0–8.3)

## 2012-06-29 LAB — CBC WITH DIFFERENTIAL/PLATELET
Basophils Absolute: 0 10*3/uL (ref 0.0–0.1)
Basophils Relative: 1 % (ref 0–1)
HCT: 50.4 % (ref 39.0–52.0)
MCHC: 34.9 g/dL (ref 30.0–36.0)
Monocytes Absolute: 0.8 10*3/uL (ref 0.1–1.0)
Neutro Abs: 4.6 10*3/uL (ref 1.7–7.7)
Neutrophils Relative %: 62 % (ref 43–77)
RDW: 14.4 % (ref 11.5–15.5)

## 2012-06-29 LAB — URINALYSIS, ROUTINE W REFLEX MICROSCOPIC
Glucose, UA: NEGATIVE mg/dL
Leukocytes, UA: NEGATIVE
Specific Gravity, Urine: 1.021 (ref 1.005–1.030)
pH: 6 (ref 5.0–8.0)

## 2012-06-29 MED ORDER — HYDROMORPHONE HCL PF 2 MG/ML IJ SOLN
2.0000 mg | Freq: Once | INTRAMUSCULAR | Status: AC
  Administered 2012-06-29: 2 mg via INTRAVENOUS
  Filled 2012-06-29: qty 1

## 2012-06-29 MED ORDER — DIVALPROEX SODIUM 250 MG PO DR TAB: 250.0000 mg | DELAYED_RELEASE_TABLET | Freq: Two times a day (BID) | ORAL | Status: DC

## 2012-06-29 MED ORDER — PROMETHAZINE HCL 25 MG PO TABS: 25.0000 mg | ORAL_TABLET | Freq: Four times a day (QID) | ORAL | Status: DC | PRN

## 2012-06-29 MED ORDER — SODIUM CHLORIDE 0.9 % IV SOLN
1000.0000 mL | Freq: Once | INTRAVENOUS | Status: AC
  Administered 2012-06-29: 1000 mL via INTRAVENOUS

## 2012-06-29 MED ORDER — ONDANSETRON HCL 4 MG/2ML IJ SOLN
4.0000 mg | Freq: Once | INTRAMUSCULAR | Status: AC
  Administered 2012-06-29: 4 mg via INTRAVENOUS
  Filled 2012-06-29: qty 2

## 2012-06-29 MED ORDER — SODIUM CHLORIDE 0.9 % IV SOLN
1000.0000 mL | INTRAVENOUS | Status: DC
  Administered 2012-06-29: 1000 mL via INTRAVENOUS

## 2012-06-29 MED ORDER — HYDROMORPHONE HCL PF 1 MG/ML IJ SOLN
1.0000 mg | Freq: Once | INTRAMUSCULAR | Status: AC
  Administered 2012-06-29: 1 mg via INTRAVENOUS
  Filled 2012-06-29: qty 1

## 2012-06-29 NOTE — ED Provider Notes (Signed)
History     CSN: [id number]  Arrival date & time 06/29/12  1722   First MD Initiated Contact with Patient 06/29/12 1729      Chief Complaint  Patient presents with  . Abdominal Pain    (Consider location/radiation/quality/duration/timing/severity/associated sxs/prior treatment) Patient is a 52 y.o. male presenting with abdominal pain. The history is provided by the patient, medical records and the EMS personnel. No language interpreter was used.  Abdominal Pain This is a new problem. The current episode started today. The problem occurs constantly. The problem has been gradually worsening. Associated symptoms include abdominal pain, diaphoresis, nausea, urinary symptoms (pain with urination) and vomiting. Pertinent negatives include no anorexia, arthralgias, change in bowel habit, chest pain, chills, congestion, coughing, fatigue, fever, headaches, joint swelling, myalgias, neck pain, numbness, rash, sore throat, swollen glands, vertigo, visual change or weakness. The symptoms are aggravated by eating. He has tried lying down for the symptoms. The treatment provided no relief.    Shawn Baird is a 52 y.o. male  with a hx of gastroparesis, pancreatitis, DM, HTN, anxiety, schizoaffective disorder, MR, tourette's disease presents to the Emergency Department complaining of gradual, persistent, progressively worsening generalized abdominal pain, worse in the lower quadrants beginning earlier in the day after eating.  Unclear if this was breakfast or lunch that he ate.  He then began having abdominal pain with associated vomiting x9 before EMS arrival, x2 with EMS and it persists here in the department in spite of the Zofran 4mg  given by EMS. Nothing makes the pain better or worse and pt is in significant distress.  Pt denies fever, chills, chest pain, diarrhea.  Pt is a difficult historian 2/2 to his psychiatric and developmental delay Hx; level 5 caveat.  Pt was discharged from [facility] this morning  after a psychiatric admission to a group home who sent him here this afternoon.  Past Medical History  Diagnosis Date  . Diabetes mellitus   . Anxiety   . Hypertension   . Hyperlipemia   . Mental disorder   . Schizoaffective disorder   . Impulsive control disorder  . Pancreatitis   . Otitis media   . Cancer   . HOH (hard of hearing)   . Tourette's disease   . Mental retardation     Past Surgical History  Procedure Laterality Date  . 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 High blood pressure Sister   . High Cholesterol Sister   . Heart disease Mother   . Stroke Mother     History  Substance Use Topics  . Smoking status: Never Smoker   . Smokeless tobacco: Never Used  . Alcohol Use: No      Review of Systems  Unable to perform ROS: Other  Constitutional: Positive for diaphoresis. Negative for fever, chills, appetite change, fatigue and unexpected weight change.  HENT: Negative for congestion, sore throat, mouth sores, trouble swallowing, neck pain and neck stiffness.   Respiratory: Negative for cough, chest tightness, shortness of breath, wheezing and stridor.   Cardiovascular: Negative for chest pain and palpitations.  Gastrointestinal: Positive for nausea, vomiting and abdominal pain. Negative for diarrhea, constipation, blood in stool, abdominal distention, rectal pain, anorexia and change in bowel habit.  Genitourinary: Negative for dysuria, urgency, frequency, hematuria, flank pain and difficulty urinating.  Musculoskeletal: Negative for myalgias, back pain, joint swelling and arthralgias.  Skin: Negative  for rash.  Neurological: Negative for vertigo, weakness, numbness and headaches.  Hematological: Negative for adenopathy.  Psychiatric/Behavioral: Negative for confusion.  All other systems reviewed and are negative.    Allergies  Review of patient's allergies indicates no known  allergies.  Home Medications   Current Outpatient Rx  Name  Route  Sig  Dispense  Refill  . amLODipine (NORVASC) 5 MG tablet   Oral   Take 5 mg by mouth daily.         Marland Kitchen aspirin EC 81 MG tablet   Oral   Take 81 mg by mouth daily.         Marland Kitchen buPROPion (WELLBUTRIN XL) 150 MG 24 hr tablet   Oral   Take 300 mg by mouth daily.         . cholecalciferol (VITAMIN D) 1000 UNITS tablet   Oral   Take 1,000 Units by mouth daily.         Marland Kitchen dicyclomine (BENTYL) 20 MG tablet   Oral   Take 20 mg by mouth 2 (two) times daily.          . divalproex (DEPAKOTE) 250 MG DR tablet   Oral   Take 1 tablet (250 mg total) by mouth 2 (two) times daily.   30 tablet   0     Will need valproic acid level in 3 days.  Follow u ...   . insulin regular (HUMULIN R) 100 units/mL injection   Subcutaneous   Inject 2-12 Units into the skin 3 (three) times daily before meals. Sliding scale         . loratadine (CLARITIN) 10 MG tablet   Oral   Take 10 mg by mouth daily.         . metoCLOPramide (REGLAN) 10 MG tablet   Oral   Take 10 mg by mouth 4 (four) times daily.         . metoprolol tartrate (LOPRESSOR) 25 MG tablet   Oral   Take 12.5 mg by mouth 2 (two) times daily.         Marland Kitchen omeprazole (PRILOSEC) 20 MG capsule   Oral   Take 20 mg by mouth daily.         . ondansetron (ZOFRAN-ODT) 4 MG disintegrating tablet   Oral   Take 4 mg by mouth every 8 (eight) hours as needed for nausea.         . polyethylene glycol (MIRALAX / GLYCOLAX) packet   Oral   Take 17 g by mouth daily.         . pravastatin (PRAVACHOL) 20 MG tablet   Oral   Take 20 mg by mouth at bedtime.          Marland Kitchen QUEtiapine (SEROQUEL) 100 MG tablet   Oral   Take 100 mg by mouth 2 (two) times daily.         . QUEtiapine (SEROQUEL) 200 MG tablet   Oral   Take 600 mg by mouth at bedtime.         . sennosides-docusate sodium (SENOKOT-S) 8.6-50 MG tablet   Oral   Take 1 tablet by mouth at bedtime as  needed for constipation.         Marland Kitchen testosterone cypionate (DEPOTESTOTERONE CYPIONATE) 100 MG/ML injection   Intramuscular   Inject 100 mg into the muscle every 28 (twenty-eight) days. For IM use only         . traMADol (ULTRAM) 50 MG tablet  Oral   Take 50 mg by mouth 3 (three) times daily as needed for pain.         . vitamin B-12 (CYANOCOBALAMIN) 1000 MCG tablet   Oral   Take 1,000 mcg by mouth every morning.          . zolpidem (AMBIEN) 5 MG tablet   Oral   Take 5 mg by mouth at bedtime as needed for sleep.         . promethazine (PHENERGAN) 25 MG tablet   Oral   Take 1 tablet (25 mg total) by mouth every 6 (six) hours as needed for nausea.   12 tablet   0     BP 128/95  Pulse 90  Resp 16  SpO2 96%  Physical Exam  Nursing note and vitals reviewed. Constitutional: He appears well-developed and well-nourished. He appears distressed.  Pt crying uncontrollably and diaphoretic  HENT:  Head: Normocephalic and atraumatic.  Mouth/Throat: Oropharynx is clear and moist.  Eyes: Conjunctivae are normal. Pupils are equal, round, and reactive to light. No scleral icterus.  Neck: Normal range of motion.  Cardiovascular: Regular rhythm, normal heart sounds and intact distal pulses.  Tachycardia present.   Pulmonary/Chest: Effort normal and breath sounds normal. No respiratory distress. He has no wheezes. He has no rales.  Abdominal: Soft. Normal appearance and bowel sounds are normal. He exhibits no distension and no mass. There is generalized tenderness. There is guarding. There is no rigidity, no rebound, no CVA tenderness, no tenderness at McBurney's point and negative Murphy's sign.  Lymphadenopathy:    He has no cervical adenopathy.  Neurological: He is alert. He exhibits normal muscle tone. Coordination normal.  Skin: Skin is warm. No rash noted. He is diaphoretic. No erythema.  Psychiatric: He has a normal mood and affect.    ED Course  Procedures (including  critical care time)  Labs Reviewed  CBC WITH DIFFERENTIAL - Abnormal; Notable for the following:    RBC 5.95 (*)    Hemoglobin 17.6 (*)    All other components within normal limits  COMPREHENSIVE METABOLIC PANEL - Abnormal; Notable for the following:    Potassium 3.4 (*)    Creatinine, Ser 1.68 (*)    Total Protein 8.7 (*)    GFR calc non Af Amer 45 (*)    GFR calc Af Amer 52 (*)    All other components within normal limits  LIPASE, BLOOD - Abnormal; Notable for the following:    Lipase 65 (*)    All other components within normal limits  GLUCOSE, CAPILLARY - Abnormal; Notable for the following:    Glucose-Capillary 144 (*)    All other components within normal limits  URINALYSIS, ROUTINE W REFLEX MICROSCOPIC   No results found.   1. Abdominal pain, generalized   2. Nausea and vomiting       MDM  Boysie Bonebrake presents with abdominal pain.  Patient is nontoxic, nonseptic appearing, in no apparent distress after pain control.  Patient's pain and other symptoms adequately managed in emergency department.  Fluid bolus given.  Labs and vitals reviewed.  Patient does not meet the SIRS or Sepsis criteria.  On repeat exam patient does not have a surgical abdomin and there are no peritoneal signs and pt is no longer TTP.  No indication of appendicitis, bowel obstruction, bowel perforation, cholecystitis, diverticulitis.  Pt tolerating PO > 6 oz without difficulty and states that he feels much better and is ready to go home.  Patient discharged home with symptomatic treatment and given strict instructions for follow-up with their primary care physician.  I have also discussed reasons to return immediately to the ER.  Patient expresses understanding and agrees with plan.           Dahlia Client , PA-C 06/29/12 2203

## 2012-06-29 NOTE — ED Notes (Signed)
ZOX:[id number]<AV> Expected date:<BR> Expected time:<BR> Means of arrival:<BR> Comments:<BR> ems- dc from here 2 hours ago, seen for behavorial, now abdominal pain and vomiting.

## 2012-06-29 NOTE — ED Notes (Signed)
D/C instructions and prescription given to staff of [facility]. Stated understanding. Ambulatory without difficulty. No complaint of pain. Belongings returned. Escorted to front of hospital by mental health tech and security.

## 2012-06-29 NOTE — ED Provider Notes (Signed)
7:42 AM Filed Vitals:   06/29/12 0346  BP: 126/90  Pulse: 82  Temp: 98.8 F (37.1 C)  Resp: 18    Likely to be placed in a group home today by social work. No complaints this AM  Lyanne Co, MD 06/29/12 [phone]

## 2012-06-29 NOTE — Consult Note (Signed)
Reason for Consult: Follow up consult for in patient treatment for aggressive behavior Referring Physician: EDP  Shawn Baird is an 52 y.o. male.  HPI: Patient appears to be doing better.  Patient states "I feel good.  I doing pretty good."  Patient denies thoughts of want to hurt himself or other. Patient states that he is feeling calm.    Past Medical History  Diagnosis Date  . Diabetes mellitus   . Anxiety   . Hypertension   . Hyperlipemia   . Mental disorder   . Schizoaffective disorder   . Impulsive control disorder  . Pancreatitis   . Otitis media   . Cancer   . HOH (hard of hearing)   . Tourette's disease   . Mental retardation     Past Surgical History  Procedure Laterality Date  . 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 High blood pressure Sister   . High Cholesterol Sister   . Heart disease Mother   . Stroke Mother     Social History:  reports that he has never smoked. He has never used smokeless tobacco. He reports that he does not drink alcohol or use illicit drugs.  Allergies: No Known Allergies  Medications: I have reviewed the patient's current medications.  Results for orders placed during the hospital encounter of 06/27/12 (from the past 48 hour(s))  CBC     Status: None   Collection Time    06/27/12 11:20 PM      Result Value Range   WBC 6.5  4.0 - 10.5 K/uL   RBC 5.66  4.22 - 5.81 MIL/uL   Hemoglobin 16.7  13.0 - 17.0 g/dL   HCT 14.7  82.9 - 56.2 %   MCV 84.1  78.0 - 100.0 fL   MCH 29.5  26.0 - 34.0 pg   MCHC 35.1  30.0 - 36.0 g/dL   RDW 13.0  86.5 - 78.4 %   Platelets 294  150 - 400 K/uL  COMPREHENSIVE METABOLIC PANEL     Status: Abnormal   Collection Time    06/27/12 11:20 PM      Result Value Range   Sodium 140  135 - 145 mEq/L   Potassium 3.7  3.5 - 5.1 mEq/L   Chloride 102  96 - 112 mEq/L   CO2 25  19 - 32 mEq/L   Glucose, Bld 125 (*) 70 - 99 mg/dL   BUN 15  6  - 23 mg/dL   Creatinine, Ser 6.96 (*) 0.50 - 1.35 mg/dL   Calcium 9.8  8.4 - 29.5 mg/dL   Total Protein 8.3  6.0 - 8.3 g/dL   Albumin 4.4  3.5 - 5.2 g/dL   AST 22  0 - 37 U/L   ALT 22  0 - 53 U/L   Alkaline Phosphatase 98  39 - 117 U/L   Total Bilirubin 0.4  0.3 - 1.2 mg/dL   GFR calc non Af Amer 45 (*) >90 mL/min   GFR calc Af Amer 52 (*) >90 mL/min   Comment:            The eGFR has been calculated     using the CKD EPI equation.     This calculation has not been     validated in all clinical     situations.     eGFR's persistently     <  90 mL/min signify     possible Chronic Kidney Disease.  Shawn Baird     Status: None   Collection Time    06/27/12 11:20 PM      Result Value Range   Alcohol, Ethyl (B) <11  0 - 11 mg/dL   Comment:            LOWEST DETECTABLE LIMIT FOR     SERUM ALCOHOL IS 11 mg/dL     FOR MEDICAL PURPOSES ONLY  URINE RAPID DRUG SCREEN (HOSP PERFORMED)     Status: None   Collection Time    06/28/12  2:01 AM      Result Value Range   Opiates NONE DETECTED  NONE DETECTED   Cocaine NONE DETECTED  NONE DETECTED   Benzodiazepines NONE DETECTED  NONE DETECTED   Amphetamines NONE DETECTED  NONE DETECTED   Tetrahydrocannabinol NONE DETECTED  NONE DETECTED   Barbiturates NONE DETECTED  NONE DETECTED   Comment:            DRUG SCREEN FOR MEDICAL PURPOSES     ONLY.  IF CONFIRMATION IS NEEDED     FOR ANY PURPOSE, NOTIFY LAB     WITHIN 5 DAYS.                LOWEST DETECTABLE LIMITS     FOR URINE DRUG SCREEN     Drug Class       Cutoff (ng/mL)     Amphetamine      1000     Barbiturate      200     Benzodiazepine   200     Tricyclics       300     Opiates          300     Cocaine          300     THC              50  GLUCOSE, CAPILLARY     Status: Abnormal   Collection Time    06/28/12 10:21 PM      Result Value Range   Glucose-Capillary 116 (*) 70 - 99 mg/dL   Comment 1 Notify RN     Comment 2 Documented in Chart      No results found.  Review of  Systems  Psychiatric/Behavioral: Positive for memory loss. Negative for depression, suicidal ideas, hallucinations and substance abuse. The patient is not nervous/anxious.   All other systems reviewed and are negative.   Blood pressure 126/90, pulse 82, temperature 98.8 F (37.1 C), temperature source Oral, resp. rate 18, SpO2 99.00%. Physical Exam  Constitutional: He appears well-developed and well-nourished.  HENT:  Head: Normocephalic and atraumatic.  Eyes: Pupils are equal, round, and reactive to light.  Neck: Normal range of motion. Neck supple.  Cardiovascular: Normal rate, regular rhythm and normal heart sounds.   Respiratory: Effort normal.  GI: Soft.  Musculoskeletal: Normal range of motion.  Neurological: He is alert.  Skin: Skin is warm and dry.  Psychiatric: His behavior is normal. His mood appears anxious. His speech is rapid and/or pressured. Cognition and memory are impaired. He expresses no homicidal and no suicidal ideation.    Assessment/Plan:  Consulted with Shawn Baird Discharge to group home Patient will need Valproic acid level in 3 days will need to be followed up by group home.  Shawn B. Rankin FNP-BC Family Nurse Practitioner, Board Certified   Baird, Shawn 06/29/2012, 11:07 AM  I am agreed with the findings and involve in the treatment plan.

## 2012-06-29 NOTE — Progress Notes (Signed)
CSW spoke with dara white, caregiver at [facility] along with Joselyn Glassman regarding patient anticipated discharge. CSW and caregivers discussed patient medication adjustments and the recommended depakote levels to be check in three days. Patient caregivers verbalized understanding. CSW and caregivers discussed that patient will need to follow up with outpatient providers to continue to manage pt medications and to continue to discuss pt  Behaviors with outpatient provider for proper medication management. Patient caregivers verbalized understanding. Pt transportation to be arranged and will be provided around 1pm.   .Catha Gosselin, LCSWA  [phone] .06/29/2012 1133am

## 2012-06-29 NOTE — ED Notes (Signed)
Pt was give a glass of ice water--- tolerated well, denied nausea at this time.

## 2012-06-29 NOTE — ED Notes (Signed)
Gloria Lambertson is pt's sister and pt's legal guardian, can be reached if need/update on pt's [phone] is home and [phone] is her cell.

## 2012-06-29 NOTE — ED Notes (Addendum)
[facility] reports that patient was at [facility] this morning for behavioral disturbances and ate something in the ED which he believes led to his current stomach pain. Patient has vomited 9 times per EMS and has vomited twice more since arriving. Per EMS report, EMS administered 4mg  Zofran in the ambulance which did not appear to effect the patient, who continued to vomit and feel stomach pain that was worse with palpation. Patient has a history of gastroparesis. Patient is very tearful, sobbing, vomiting, and in great distress.

## 2012-06-30 ENCOUNTER — Encounter (HOSPITAL_COMMUNITY): Payer: Self-pay | Admitting: Emergency Medicine

## 2012-06-30 ENCOUNTER — Telehealth (HOSPITAL_COMMUNITY): Payer: Self-pay | Admitting: *Deleted

## 2012-06-30 ENCOUNTER — Emergency Department (HOSPITAL_COMMUNITY)
Admission: EM | Admit: 2012-06-30 | Discharge: 2012-06-30 | Payer: Medicare Other | Attending: Emergency Medicine | Admitting: Emergency Medicine

## 2012-06-30 DIAGNOSIS — R109 Unspecified abdominal pain: Secondary | ICD-10-CM

## 2012-06-30 DIAGNOSIS — E785 Hyperlipidemia, unspecified: Secondary | ICD-10-CM | POA: Insufficient documentation

## 2012-06-30 DIAGNOSIS — Z7982 Long term (current) use of aspirin: Secondary | ICD-10-CM | POA: Insufficient documentation

## 2012-06-30 DIAGNOSIS — R259 Unspecified abnormal involuntary movements: Secondary | ICD-10-CM | POA: Insufficient documentation

## 2012-06-30 DIAGNOSIS — I10 Essential (primary) hypertension: Secondary | ICD-10-CM | POA: Insufficient documentation

## 2012-06-30 DIAGNOSIS — F952 Tourette's disorder: Secondary | ICD-10-CM | POA: Insufficient documentation

## 2012-06-30 DIAGNOSIS — Z794 Long term (current) use of insulin: Secondary | ICD-10-CM | POA: Insufficient documentation

## 2012-06-30 DIAGNOSIS — Z859 Personal history of malignant neoplasm, unspecified: Secondary | ICD-10-CM | POA: Insufficient documentation

## 2012-06-30 DIAGNOSIS — Z8719 Personal history of other diseases of the digestive system: Secondary | ICD-10-CM | POA: Insufficient documentation

## 2012-06-30 DIAGNOSIS — F79 Unspecified intellectual disabilities: Secondary | ICD-10-CM | POA: Insufficient documentation

## 2012-06-30 DIAGNOSIS — F411 Generalized anxiety disorder: Secondary | ICD-10-CM | POA: Insufficient documentation

## 2012-06-30 DIAGNOSIS — F259 Schizoaffective disorder, unspecified: Secondary | ICD-10-CM | POA: Insufficient documentation

## 2012-06-30 DIAGNOSIS — Z79899 Other long term (current) drug therapy: Secondary | ICD-10-CM | POA: Insufficient documentation

## 2012-06-30 DIAGNOSIS — R1110 Vomiting, unspecified: Secondary | ICD-10-CM

## 2012-06-30 DIAGNOSIS — H9190 Unspecified hearing loss, unspecified ear: Secondary | ICD-10-CM | POA: Insufficient documentation

## 2012-06-30 DIAGNOSIS — E119 Type 2 diabetes mellitus without complications: Secondary | ICD-10-CM | POA: Insufficient documentation

## 2012-06-30 DIAGNOSIS — F639 Impulse disorder, unspecified: Secondary | ICD-10-CM | POA: Insufficient documentation

## 2012-06-30 LAB — GLUCOSE, CAPILLARY: Glucose-Capillary: 167 mg/dL — ABNORMAL HIGH (ref 70–99)

## 2012-06-30 MED ORDER — GI COCKTAIL ~~LOC~~
30.0000 mL | Freq: Once | ORAL | Status: AC
  Administered 2012-06-30: 30 mL via ORAL
  Filled 2012-06-30: qty 30

## 2012-06-30 MED ORDER — OXYCODONE-ACETAMINOPHEN 5-325 MG PO TABS
1.0000 | ORAL_TABLET | Freq: Once | ORAL | Status: AC
  Administered 2012-06-30: 1 via ORAL
  Filled 2012-06-30: qty 1

## 2012-06-30 MED ORDER — ONDANSETRON 8 MG PO TBDP
8.0000 mg | ORAL_TABLET | Freq: Once | ORAL | Status: AC
  Administered 2012-06-30: 8 mg via ORAL
  Filled 2012-06-30: qty 1

## 2012-06-30 NOTE — ED Provider Notes (Signed)
History     CSN: [id number]  Arrival date & time 06/30/12  0458   First MD Initiated Contact with Patient 06/30/12 0502      Chief Complaint  Patient presents with  . Abdominal Pain  . Emesis    (Consider location/radiation/quality/duration/timing/severity/associated sxs/prior treatment) HPI History provided by pt and prior chart.  Pt from Group home.  Has h/o gastroparesis, pancreatitis, DM, HTN, anxiety, schizoaffective disorder, MR, tourette's. Per prior chart, admitted 3 days ago for aggressive behavior and HI.  Came to ED last night w/ c/o abd pain and N/V/D.  Sx controlled and abd benign at time of discharge.  Labs sig for elevated lipase.  D/c'd home w/ phenergan.  Returns w/ same.  Pain located in center of abd.  Has vomited three times since discharge, despite taking phenergan.  EMS reported that patient had not filled this medication.  Past Medical History  Diagnosis Date  . Diabetes mellitus   . Anxiety   . Hypertension   . Hyperlipemia   . Mental disorder   . Schizoaffective disorder   . Impulsive control disorder  . Pancreatitis   . Otitis media   . Cancer   . HOH (hard of hearing)   . Tourette's disease   . Mental retardation     Past Surgical History  Procedure Laterality Date  . 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 High blood pressure Sister   . High Cholesterol Sister   . Heart disease Mother   . Stroke Mother     History  Substance Use Topics  . Smoking status: Never Smoker   . Smokeless tobacco: Never Used  . Alcohol Use: No      Review of Systems  All other systems reviewed and are negative.    Allergies  Review of patient's allergies indicates no known allergies.  Home Medications   Current Outpatient Rx  Name  Route  Sig  Dispense  Refill  . amLODipine (NORVASC) 5 MG tablet   Oral   Take 5 mg by mouth daily.         Marland Kitchen aspirin EC 81 MG tablet   Oral  Take 81 mg by mouth daily.         Marland Kitchen buPROPion (WELLBUTRIN XL) 150 MG 24 hr tablet   Oral   Take 300 mg by mouth daily.         . cholecalciferol (VITAMIN D) 1000 UNITS tablet   Oral   Take 1,000 Units by mouth daily.         Marland Kitchen dicyclomine (BENTYL) 20 MG tablet   Oral   Take 20 mg by mouth 2 (two) times daily.          . divalproex (DEPAKOTE) 250 MG DR tablet   Oral   Take 1 tablet (250 mg total) by mouth 2 (two) times daily.   30 tablet   0     Will need valproic acid level in 3 days.  Follow u ...   . insulin regular (HUMULIN R) 100 units/mL injection   Subcutaneous   Inject 2-12 Units into the skin 3 (three) times daily before meals. Sliding scale         . loratadine (CLARITIN) 10 MG tablet   Oral   Take 10 mg by mouth daily.         . metoCLOPramide (REGLAN)  10 MG tablet   Oral   Take 10 mg by mouth 4 (four) times daily.         . metoprolol tartrate (LOPRESSOR) 25 MG tablet   Oral   Take 12.5 mg by mouth 2 (two) times daily.         Marland Kitchen omeprazole (PRILOSEC) 20 MG capsule   Oral   Take 20 mg by mouth daily.         . ondansetron (ZOFRAN-ODT) 4 MG disintegrating tablet   Oral   Take 4 mg by mouth every 8 (eight) hours as needed for nausea.         . polyethylene glycol (MIRALAX / GLYCOLAX) packet   Oral   Take 17 g by mouth daily.         . pravastatin (PRAVACHOL) 20 MG tablet   Oral   Take 20 mg by mouth at bedtime.          . promethazine (PHENERGAN) 25 MG tablet   Oral   Take 1 tablet (25 mg total) by mouth every 6 (six) hours as needed for nausea.   12 tablet   0   . QUEtiapine (SEROQUEL) 100 MG tablet   Oral   Take 100 mg by mouth 2 (two) times daily.         . QUEtiapine (SEROQUEL) 200 MG tablet   Oral   Take 600 mg by mouth at bedtime.         . sennosides-docusate sodium (SENOKOT-S) 8.6-50 MG tablet   Oral   Take 1 tablet by mouth at bedtime as needed for constipation.         Marland Kitchen testosterone cypionate  (DEPOTESTOTERONE CYPIONATE) 100 MG/ML injection   Intramuscular   Inject 100 mg into the muscle every 28 (twenty-eight) days. For IM use only         . traMADol (ULTRAM) 50 MG tablet   Oral   Take 50 mg by mouth 3 (three) times daily as needed for pain.         . vitamin B-12 (CYANOCOBALAMIN) 1000 MCG tablet   Oral   Take 1,000 mcg by mouth every morning.          . zolpidem (AMBIEN) 5 MG tablet   Oral   Take 5 mg by mouth at bedtime as needed for sleep.           BP 157/104  Pulse 104  Temp(Src) 98.7 F (37.1 C) (Oral)  Resp 20  SpO2 93%  Physical Exam  Nursing note and vitals reviewed. Constitutional: He is oriented to person, place, and time. He appears well-developed and well-nourished. No distress.  Vomit on shirt.    HENT:  Head: Normocephalic and atraumatic.  Eyes:  Normal appearance  Neck: Normal range of motion.  Cardiovascular: Normal rate and regular rhythm.   HR 92  Pulmonary/Chest: Effort normal and breath sounds normal. No respiratory distress.  Abdominal: Soft. Bowel sounds are normal. He exhibits no distension and no mass. There is no rebound and no guarding.  Reports that entire abd is tender  Genitourinary:  No CVA tenderness  Musculoskeletal: Normal range of motion.  Neurological: He is alert and oriented to person, place, and time.  Generalized tremor  Skin: Skin is warm and dry. No rash noted.  Psychiatric: He has a normal mood and affect. His behavior is normal.    ED Course  Procedures (including critical care time)  Labs Reviewed - No data to display  No results found.   1. Abdominal pain   2. Vomiting       MDM  52yo M presents w/ abd pain and vomiting.  Was seen for same in ED last night, symptoms controlled, abd benign at time of discharge, labs sig for elevated lipase, d/c'd back to group home w/ phenergan.  Per EMS, this medication was not filled and pt reports 3 episodes of vomiting this morning.  On exam, afebrile,  non-toxic appearance, VS w/in nml range, vomit on shirt, abd soft/non-distended, diffusely ttp.  Will treat w/ po zofran/percocet and GI cocktail, po challenge and hopefully d/c home.  5:26 AM   Pt is no longer tremulous.  Explained the cause of his symptoms and the importance of taking his anti-emetics.  Recommended that he continue daily prilosec as well in case this is gastroenteritis.  Return precautions discussed. 6:16 AM      Otilio Miu, PA-C 06/30/12 [phone]

## 2012-06-30 NOTE — ED Notes (Signed)
[id number]<AV> Expected date:<BR> Expected time:<BR> Means of arrival:<BR> Comments:<BR> From Group Home/abdominal pain

## 2012-06-30 NOTE — ED Notes (Signed)
Pt has had a drink of approximately 8 oz of water with his Percocet at 0532---tolerating so far, pt exhibits no s/s vomiting at this time.

## 2012-06-30 NOTE — ED Notes (Signed)
Brought in by EMS from a group home ([facility]) with c/o abdominal pain with nausea and vomiting.  Pt presents to ED with active emesis--- noted undigested noodle soup and rice in the vomitus.  Pt was seen here last night and yesterday morning for same.  Per EMS, pt's discharge prescriptions were not filled.

## 2012-06-30 NOTE — ED Provider Notes (Signed)
Medical screening examination/treatment/procedure(s) were performed by non-physician practitioner and as supervising physician I was immediately available for consultation/collaboration.   , MD 06/30/12 0635 

## 2012-07-01 ENCOUNTER — Other Ambulatory Visit: Payer: Medicare Other

## 2012-07-02 NOTE — ED Provider Notes (Signed)
Medical screening examination/treatment/procedure(s) were performed by non-physician practitioner and as supervising physician I was immediately available for consultation/collaboration.     R , MD 07/02/12 1512 

## 2012-07-05 ENCOUNTER — Ambulatory Visit
Admission: RE | Admit: 2012-07-05 | Discharge: 2012-07-05 | Disposition: A | Discharge status: Home / Self Care | Payer: Medicare Other | Source: Ambulatory Visit | Attending: Nephrology | Admitting: Nephrology

## 2012-07-08 ENCOUNTER — Emergency Department (HOSPITAL_COMMUNITY)
Admission: EM | Admit: 2012-07-08 | Discharge: 2012-07-09 | Disposition: A | Discharge status: Home / Self Care | Payer: Medicare Other | Attending: Emergency Medicine | Admitting: Emergency Medicine

## 2012-07-08 ENCOUNTER — Emergency Department (HOSPITAL_COMMUNITY): Payer: Medicare Other

## 2012-07-08 ENCOUNTER — Encounter (HOSPITAL_COMMUNITY): Payer: Self-pay | Admitting: Emergency Medicine

## 2012-07-08 DIAGNOSIS — F411 Generalized anxiety disorder: Secondary | ICD-10-CM | POA: Insufficient documentation

## 2012-07-08 DIAGNOSIS — R000 Tachycardia, unspecified: Secondary | ICD-10-CM | POA: Insufficient documentation

## 2012-07-08 DIAGNOSIS — F952 Tourette's disorder: Secondary | ICD-10-CM | POA: Insufficient documentation

## 2012-07-08 DIAGNOSIS — E119 Type 2 diabetes mellitus without complications: Secondary | ICD-10-CM | POA: Insufficient documentation

## 2012-07-08 DIAGNOSIS — Z8669 Personal history of other diseases of the nervous system and sense organs: Secondary | ICD-10-CM | POA: Insufficient documentation

## 2012-07-08 DIAGNOSIS — Z8659 Personal history of other mental and behavioral disorders: Secondary | ICD-10-CM | POA: Insufficient documentation

## 2012-07-08 DIAGNOSIS — F79 Unspecified intellectual disabilities: Secondary | ICD-10-CM | POA: Insufficient documentation

## 2012-07-08 DIAGNOSIS — Z8719 Personal history of other diseases of the digestive system: Secondary | ICD-10-CM | POA: Insufficient documentation

## 2012-07-08 DIAGNOSIS — F259 Schizoaffective disorder, unspecified: Secondary | ICD-10-CM | POA: Insufficient documentation

## 2012-07-08 DIAGNOSIS — R0602 Shortness of breath: Secondary | ICD-10-CM | POA: Insufficient documentation

## 2012-07-08 DIAGNOSIS — Z862 Personal history of diseases of the blood and blood-forming organs and certain disorders involving the immune mechanism: Secondary | ICD-10-CM | POA: Insufficient documentation

## 2012-07-08 DIAGNOSIS — Z794 Long term (current) use of insulin: Secondary | ICD-10-CM | POA: Insufficient documentation

## 2012-07-08 DIAGNOSIS — I10 Essential (primary) hypertension: Secondary | ICD-10-CM | POA: Insufficient documentation

## 2012-07-08 DIAGNOSIS — Z8639 Personal history of other endocrine, nutritional and metabolic disease: Secondary | ICD-10-CM | POA: Insufficient documentation

## 2012-07-08 DIAGNOSIS — Z7982 Long term (current) use of aspirin: Secondary | ICD-10-CM | POA: Insufficient documentation

## 2012-07-08 DIAGNOSIS — J029 Acute pharyngitis, unspecified: Secondary | ICD-10-CM | POA: Insufficient documentation

## 2012-07-08 DIAGNOSIS — Z79899 Other long term (current) drug therapy: Secondary | ICD-10-CM | POA: Insufficient documentation

## 2012-07-08 LAB — BASIC METABOLIC PANEL
BUN: 11 mg/dL (ref 6–23)
BUN: 12 mg/dL (ref 6–23)
CO2: 20 mEq/L (ref 19–32)
CO2: 25 mEq/L (ref 19–32)
Chloride: 103 mEq/L (ref 96–112)
Chloride: 105 mEq/L (ref 96–112)
Creatinine, Ser: 1.64 mg/dL — ABNORMAL HIGH (ref 0.50–1.35)
Creatinine, Ser: 1.58 mg/dL — ABNORMAL HIGH (ref 0.50–1.35)
Glucose, Bld: 151 mg/dL — ABNORMAL HIGH (ref 70–99)
Glucose, Bld: 146 mg/dL — ABNORMAL HIGH (ref 70–99)

## 2012-07-08 LAB — CBC
HCT: 44.2 % (ref 39.0–52.0)
Hemoglobin: 14.7 g/dL (ref 13.0–17.0)
MCH: 28.4 pg (ref 26.0–34.0)
MCV: 85.5 fL (ref 78.0–100.0)
RBC: 5.17 MIL/uL (ref 4.22–5.81)
WBC: 7.9 10*3/uL (ref 4.0–10.5)

## 2012-07-08 LAB — RAPID STREP SCREEN (MED CTR MEBANE ONLY): Streptococcus, Group A Screen (Direct): NEGATIVE

## 2012-07-08 MED ORDER — HYDROCODONE-ACETAMINOPHEN 7.5-325 MG/15ML PO SOLN
10.0000 mL | Freq: Once | ORAL | Status: AC
  Administered 2012-07-09: 10 mL via ORAL
  Filled 2012-07-08: qty 15

## 2012-07-08 MED ORDER — NAPROXEN 500 MG PO TABS: 500.0000 mg | ORAL_TABLET | Freq: Two times a day (BID) | ORAL | Status: DC

## 2012-07-08 NOTE — ED Notes (Signed)
PER EMS- pt picked up from home with c/o sore throat x2 hours.  Caregiver called to report it.  Denies SOB, difficulty breathing, or injury to throat.  Pt alert and oriented per baseline.

## 2012-07-08 NOTE — ED Notes (Signed)
[id number]<AV> Expected date:07/08/12<BR> Expected time: 7:49 PM<BR> Means of arrival:Ambulance<BR> Comments:<BR> EMS

## 2012-07-08 NOTE — ED Notes (Signed)
Patient transported to X-ray 

## 2012-07-08 NOTE — ED Provider Notes (Signed)
History     CSN: [id number]  Arrival date & time 07/08/12  2002   First MD Initiated Contact with Patient 07/08/12 2027      Chief Complaint  Patient presents with  . Sore Throat  . Tachycardia  . Abdominal Pain    (Consider location/radiation/quality/duration/timing/severity/associated sxs/prior treatment) HPI Comments: 52 year old male, history of mental retardation this, pancreatitis, diabetes and schizoaffective disorder. He presents with a complaint of a sore throat, when asked what is bothering him he says his throat hurts, and this has been present for 2 days, he has associated occasional shortness of breath but denies any pain to me. He denies abdominal pain, denies chest pain, denies leg pain.  Due to mental retardation a level V caveat applies  The history is provided by the patient.    Past Medical History  Diagnosis Date  . Diabetes mellitus   . Anxiety   . Hypertension   . Hyperlipemia   . Mental disorder   . Schizoaffective disorder   . Impulsive control disorder  . Pancreatitis   . Otitis media   . Cancer   . HOH (hard of hearing)   . Tourette's disease   . Mental retardation     Past Surgical History  Procedure Laterality Date  . 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 High blood pressure Sister   . High Cholesterol Sister   . Heart disease Mother   . Stroke Mother     History  Substance Use Topics  . Smoking status: Never Smoker   . Smokeless tobacco: Never Used  . Alcohol Use: No      Review of Systems  Unable to perform ROS: Other    Allergies  Review of patient's allergies indicates no known allergies.  Home Medications   Current Outpatient Rx  Name  Route  Sig  Dispense  Refill  . amLODipine (NORVASC) 5 MG tablet   Oral   Take 5 mg by mouth every morning.          Marland Kitchen aspirin EC 81 MG tablet   Oral   Take 81 mg by mouth every morning.          Marland Kitchen  buPROPion (WELLBUTRIN XL) 150 MG 24 hr tablet   Oral   Take 300 mg by mouth every morning.          . cholecalciferol (VITAMIN D) 1000 UNITS tablet   Oral   Take 1,000 Units by mouth every morning.          . dicyclomine (BENTYL) 20 MG tablet   Oral   Take 20 mg by mouth 2 (two) times daily.          . divalproex (DEPAKOTE ER) 500 MG 24 hr tablet   Oral   Take 500 mg by mouth 2 (two) times daily.         . divalproex (DEPAKOTE) 250 MG DR tablet   Oral   Take 250 mg by mouth 2 (two) times daily. Take with 500mg  tablet. Total dose 750mg  twice daily         . insulin regular (HUMULIN R) 100 units/mL injection   Subcutaneous   Inject 2-12 Units into the skin 3 (three) times daily as needed for high blood sugar. Sliding scale         . loratadine (CLARITIN) 10 MG tablet  Oral   Take 10 mg by mouth every morning.          . metoCLOPramide (REGLAN) 10 MG tablet   Oral   Take 10 mg by mouth 4 (four) times daily -  before meals and at bedtime.          . metoprolol tartrate (LOPRESSOR) 25 MG tablet   Oral   Take 12.5 mg by mouth 2 (two) times daily.         Marland Kitchen omeprazole (PRILOSEC) 20 MG capsule   Oral   Take 20 mg by mouth every morning.          . polyethylene glycol (MIRALAX / GLYCOLAX) packet   Oral   Take 17 g by mouth every morning.          . pravastatin (PRAVACHOL) 20 MG tablet   Oral   Take 20 mg by mouth at bedtime.          Marland Kitchen QUEtiapine (SEROQUEL) 100 MG tablet   Oral   Take 100 mg by mouth 2 (two) times daily.         . QUEtiapine (SEROQUEL) 200 MG tablet   Oral   Take 600 mg by mouth at bedtime.         Marland Kitchen testosterone cypionate (DEPOTESTOTERONE CYPIONATE) 100 MG/ML injection   Intramuscular   Inject 100 mg into the muscle every 28 (twenty-eight) days. For IM use only         . vitamin B-12 (CYANOCOBALAMIN) 1000 MCG tablet   Oral   Take 1,000 mcg by mouth every morning.          . naproxen (NAPROSYN) 500 MG tablet    Oral   Take 1 tablet (500 mg total) by mouth 2 (two) times daily with a meal.   30 tablet   0     BP 124/92  Pulse 97  Temp(Src) 98.7 F (37.1 C) (Oral)  Resp 20  SpO2 99%  Physical Exam  Nursing note and vitals reviewed. Constitutional: He appears well-developed and well-nourished. No distress.  HENT:  Head: Normocephalic and atraumatic.  Mouth/Throat: Oropharynx is clear and moist. No oropharyngeal exudate.  No asymmetry exudate or hypertrophy. The posterior for headaches is erythematous. Mucous membranes are moist  Eyes: Conjunctivae and EOM are normal. Pupils are equal, round, and reactive to light. Right eye exhibits no discharge. Left eye exhibits no discharge. No scleral icterus.  Neck: Normal range of motion. Neck supple. No JVD present. No thyromegaly present.  Very supple neck, no lymphadenopathy  Cardiovascular: Regular rhythm, normal heart sounds and intact distal pulses.  Exam reveals no gallop and no friction rub.   No murmur heard. Mild tachycardia to 105 beats per minute  Pulmonary/Chest: Effort normal and breath sounds normal. No respiratory distress. He has no wheezes. He has no rales.  Though the patient has short shallow breaths at a rate of approximately 40 breaths per minute he is able to speak in full sentences when prompted and appears in no distress. He has no rales, no wheezing  Abdominal: Soft. Bowel sounds are normal. He exhibits no distension and no mass. There is no tenderness.  Abdomen is minimally distended, there is tympanitic sounds in the upper abdomen but not tense, nontender, no guarding, no abdominal tenderness anywhere.  Musculoskeletal: Normal range of motion. He exhibits no edema and no tenderness.  Lymphadenopathy:    He has no cervical adenopathy.  Neurological: He is alert. Coordination normal.  Follows commands  Skin: Skin is warm and dry. No rash noted. No erythema.  Psychiatric: He has a normal mood and affect. His behavior is  normal.    ED Course  Procedures (including critical care time)  Labs Reviewed  BASIC METABOLIC PANEL - Abnormal; Notable for the following:    Potassium 6.4 (*)    Glucose, Bld 151 (*)    Creatinine, Ser 1.64 (*)    GFR calc non Af Amer 46 (*)    GFR calc Af Amer 54 (*)    All other components within normal limits  BASIC METABOLIC PANEL - Abnormal; Notable for the following:    Glucose, Bld 146 (*)    Creatinine, Ser 1.58 (*)    GFR calc non Af Amer 48 (*)    GFR calc Af Amer 56 (*)    All other components within normal limits  RAPID STREP SCREEN  CULTURE, GROUP A STREP  CBC   Dg Chest 2 View  07/08/2012   *RADIOLOGY REPORT*  Clinical Data: Sore throat, tachycardia, abdominal pain  CHEST - 2 VIEW  Comparison: 03/08/2012  Findings: Study is limited by poor inspiration.  No acute infiltrate or pulmonary edema.  Mild basilar atelectasis.  There is gaseous distention of the stomach with fluid and air fluid level. Mild colonic distention.  No free abdominal air.  IMPRESSION: No acute infiltrate or pulmonary edema.  Mild basilar atelectasis. There is gaseous distention of the stomach with fluid and air fluid level.  Mild colonic distention.  No free abdominal air.   Original Report Authenticated By: Natasha Mead, M.D.     1. Pharyngitis       MDM  Vital signs reveal a borderline fever, mild tachycardia and the patient complains of a sore throat but does not appear to have an exudative pharyngitis.  Strep test is negative  CBC is normal  Basic metabolic panel is normal.  I discussed the care with the person at the group home where the patient lives he states that Mr. Jupin was complaining of a sore throat on and off throughout the day. On my exam the patient appears very comfortable, he is not in distress, he has no change in phonation, no difficulty with tolerating secretions and now has a pulse of 95, no fever, stable for discharge.   Meds given in ED:  Medications   HYDROcodone-acetaminophen (HYCET) 7.5-325 mg/15 ml solution 10 mL (not administered)    New Prescriptions   NAPROXEN (NAPROSYN) 500 MG TABLET    Take 1 tablet (500 mg total) by mouth 2 (two) times daily with a meal.            Vida Roller, MD 07/08/12 2354

## 2012-07-09 NOTE — ED Notes (Signed)
[facility] called and made aware pt is returning, states I will need to call [facility] they do not have transportation. [facility] called.

## 2012-07-10 LAB — CULTURE, GROUP A STREP

## 2012-07-20 ENCOUNTER — Emergency Department (HOSPITAL_COMMUNITY)
Admission: EM | Admit: 2012-07-20 | Discharge: 2012-07-20 | Disposition: A | Discharge status: Home / Self Care | Payer: Medicare Other | Attending: Emergency Medicine | Admitting: Emergency Medicine

## 2012-07-20 ENCOUNTER — Encounter (HOSPITAL_COMMUNITY): Payer: Self-pay | Admitting: Emergency Medicine

## 2012-07-20 DIAGNOSIS — E785 Hyperlipidemia, unspecified: Secondary | ICD-10-CM | POA: Insufficient documentation

## 2012-07-20 DIAGNOSIS — Z8589 Personal history of malignant neoplasm of other organs and systems: Secondary | ICD-10-CM | POA: Insufficient documentation

## 2012-07-20 DIAGNOSIS — F411 Generalized anxiety disorder: Secondary | ICD-10-CM | POA: Insufficient documentation

## 2012-07-20 DIAGNOSIS — Z79899 Other long term (current) drug therapy: Secondary | ICD-10-CM | POA: Insufficient documentation

## 2012-07-20 DIAGNOSIS — IMO0002 Reserved for concepts with insufficient information to code with codable children: Secondary | ICD-10-CM | POA: Insufficient documentation

## 2012-07-20 DIAGNOSIS — Z7982 Long term (current) use of aspirin: Secondary | ICD-10-CM | POA: Insufficient documentation

## 2012-07-20 DIAGNOSIS — F259 Schizoaffective disorder, unspecified: Secondary | ICD-10-CM | POA: Insufficient documentation

## 2012-07-20 DIAGNOSIS — Z8719 Personal history of other diseases of the digestive system: Secondary | ICD-10-CM | POA: Insufficient documentation

## 2012-07-20 DIAGNOSIS — E119 Type 2 diabetes mellitus without complications: Secondary | ICD-10-CM

## 2012-07-20 DIAGNOSIS — I10 Essential (primary) hypertension: Secondary | ICD-10-CM | POA: Insufficient documentation

## 2012-07-20 DIAGNOSIS — Z794 Long term (current) use of insulin: Secondary | ICD-10-CM | POA: Insufficient documentation

## 2012-07-20 NOTE — ED Notes (Signed)
ZOX:[id number]<AV> Expected date:<BR> Expected time:<BR> Means of arrival:<BR> Comments:<BR> Strep throat

## 2012-07-20 NOTE — ED Notes (Signed)
Group home called x2, states that someone would be on the way to pick up pt.

## 2012-07-20 NOTE — ED Notes (Signed)
Per EMS-pt c/o of sore throat, seen here a couple of days ago for same. Reports that he has been taking medication with no relief. NAD at this time.

## 2012-07-20 NOTE — ED Provider Notes (Signed)
History    CSN: [id number] Arrival date & time 07/20/12  0805  First MD Initiated Contact with Patient 07/20/12 [phone]     Chief Complaint  Patient presents with  . Sore Throat   (Consider location/radiation/quality/duration/timing/severity/associated sxs/prior Treatment) Patient is a 52 y.o. male presenting with pharyngitis. The history is provided by the patient (pt states he has had a sorethroat off and on.  no pain now).  Sore Throat This is a recurrent problem. The current episode started 1 to 2 hours ago. The problem occurs rarely. The problem has been resolved. Pertinent negatives include no chest pain, no abdominal pain and no headaches. Nothing aggravates the symptoms. Nothing relieves the symptoms.   Past Medical History  Diagnosis Date  . Diabetes mellitus   . Anxiety   . Hypertension   . Hyperlipemia   . Mental disorder   . Schizoaffective disorder   . Impulsive control disorder  . Pancreatitis   . Otitis media   . Cancer   . HOH (hard of hearing)   . Tourette's disease   . Mental retardation    Past Surgical History  Procedure Laterality Date  . 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 High blood pressure Sister   . High Cholesterol Sister   . Heart disease Mother   . Stroke Mother    History  Substance Use Topics  . Smoking status: Never Smoker   . Smokeless tobacco: Never Used  . Alcohol Use: No    Review of Systems  Constitutional: Negative for appetite change and fatigue.  HENT: Negative for congestion, sinus pressure and ear discharge.   Eyes: Negative for discharge.  Respiratory: Negative for cough.   Cardiovascular: Negative for chest pain.  Gastrointestinal: Negative for abdominal pain and diarrhea.  Genitourinary: Negative for frequency and hematuria.  Musculoskeletal: Negative for back pain.  Skin: Negative for rash.  Neurological: Negative for seizures and headaches.   Psychiatric/Behavioral: Positive for agitation. Negative for hallucinations.    Allergies  Review of patient's allergies indicates no known allergies.  Home Medications   Current Outpatient Rx  Name  Route  Sig  Dispense  Refill  . amLODipine (NORVASC) 5 MG tablet   Oral   Take 5 mg by mouth every morning.          Marland Kitchen amoxicillin (AMOXIL) 500 MG capsule   Oral   Take 500 mg by mouth 3 (three) times daily.         Marland Kitchen aspirin EC 81 MG tablet   Oral   Take 81 mg by mouth every morning.          Marland Kitchen buPROPion (WELLBUTRIN XL) 150 MG 24 hr tablet   Oral   Take 300 mg by mouth every morning.          . cholecalciferol (VITAMIN D) 1000 UNITS tablet   Oral   Take 1,000 Units by mouth every morning.          . dicyclomine (BENTYL) 20 MG tablet   Oral   Take 20 mg by mouth 2 (two) times daily.          . divalproex (DEPAKOTE ER) 500 MG 24 hr tablet   Oral   Take 500 mg by mouth 2 (two) times daily.         Marland Kitchen gabapentin (NEURONTIN) 300 MG capsule   Oral  Take 300 mg by mouth at bedtime.         . insulin regular (HUMULIN R) 100 units/mL injection   Subcutaneous   Inject 2-12 Units into the skin 3 (three) times daily as needed for high blood sugar. Sliding scale         . loratadine (CLARITIN) 10 MG tablet   Oral   Take 10 mg by mouth every morning.          . metoCLOPramide (REGLAN) 10 MG tablet   Oral   Take 10 mg by mouth 4 (four) times daily -  before meals and at bedtime.          . metoprolol tartrate (LOPRESSOR) 25 MG tablet   Oral   Take 12.5 mg by mouth 2 (two) times daily.         . naproxen (NAPROSYN) 500 MG tablet   Oral   Take 1 tablet (500 mg total) by mouth 2 (two) times daily with a meal.   30 tablet   0   . omeprazole (PRILOSEC) 20 MG capsule   Oral   Take 20 mg by mouth every morning.          . polyethylene glycol (MIRALAX / GLYCOLAX) packet   Oral   Take 17 g by mouth every morning.          . pravastatin  (PRAVACHOL) 20 MG tablet   Oral   Take 20 mg by mouth at bedtime.          Marland Kitchen QUEtiapine (SEROQUEL) 100 MG tablet   Oral   Take 100 mg by mouth 2 (two) times daily.         . QUEtiapine (SEROQUEL) 200 MG tablet   Oral   Take 600 mg by mouth at bedtime.         Marland Kitchen testosterone cypionate (DEPOTESTOTERONE CYPIONATE) 100 MG/ML injection   Intramuscular   Inject 100 mg into the muscle every 28 (twenty-eight) days. For IM use only         . vitamin B-12 (CYANOCOBALAMIN) 1000 MCG tablet   Oral   Take 1,000 mcg by mouth every morning.           BP 135/100  Pulse 98  Temp(Src) 98.3 F (36.8 C) (Oral)  Resp 20  SpO2 100% Physical Exam  Constitutional: He is oriented to person, place, and time. He appears well-developed.  HENT:  Head: Normocephalic.  Eyes: Conjunctivae and EOM are normal. No scleral icterus.  Neck: Neck supple. No thyromegaly present.  Cardiovascular: Normal rate and regular rhythm.  Exam reveals no gallop and no friction rub.   No murmur heard. Pulmonary/Chest: No stridor. He has no wheezes. He has no rales. He exhibits no tenderness.  Abdominal: He exhibits no distension. There is no tenderness. There is no rebound.  Musculoskeletal: Normal range of motion. He exhibits no edema.  Lymphadenopathy:    He has no cervical adenopathy.  Neurological: He is oriented to person, place, and time. Coordination normal.  Skin: No rash noted. No erythema.  Psychiatric:  Pt states he occasionally has auditory hallucinations.  None now    ED Course  Procedures (including critical care time) Labs Reviewed  GLUCOSE, CAPILLARY - Abnormal; Notable for the following:    Glucose-Capillary 117 (*)    All other components within normal limits   No results found. 1. Diabetes   2. Schizo-affective schizophrenia     MDM  Pt with normal throat.  Will  d/c home for follow up prn  Benny Lennert, MD 07/20/12 [phone]

## 2012-07-20 NOTE — ED Notes (Signed)
[facility] called  

## 2012-08-24 ENCOUNTER — Emergency Department (HOSPITAL_COMMUNITY)
Admission: EM | Admit: 2012-08-24 | Discharge: 2012-08-24 | Disposition: A | Discharge status: Home / Self Care | Payer: Medicare Other | Attending: Emergency Medicine | Admitting: Emergency Medicine

## 2012-08-24 ENCOUNTER — Encounter (HOSPITAL_COMMUNITY): Payer: Self-pay

## 2012-08-24 DIAGNOSIS — Z8659 Personal history of other mental and behavioral disorders: Secondary | ICD-10-CM | POA: Insufficient documentation

## 2012-08-24 DIAGNOSIS — E785 Hyperlipidemia, unspecified: Secondary | ICD-10-CM | POA: Insufficient documentation

## 2012-08-24 DIAGNOSIS — Z8719 Personal history of other diseases of the digestive system: Secondary | ICD-10-CM | POA: Insufficient documentation

## 2012-08-24 DIAGNOSIS — F259 Schizoaffective disorder, unspecified: Secondary | ICD-10-CM

## 2012-08-24 DIAGNOSIS — Z7982 Long term (current) use of aspirin: Secondary | ICD-10-CM | POA: Insufficient documentation

## 2012-08-24 DIAGNOSIS — Z794 Long term (current) use of insulin: Secondary | ICD-10-CM | POA: Insufficient documentation

## 2012-08-24 DIAGNOSIS — R413 Other amnesia: Secondary | ICD-10-CM | POA: Insufficient documentation

## 2012-08-24 DIAGNOSIS — I10 Essential (primary) hypertension: Secondary | ICD-10-CM | POA: Insufficient documentation

## 2012-08-24 DIAGNOSIS — Z8669 Personal history of other diseases of the nervous system and sense organs: Secondary | ICD-10-CM | POA: Insufficient documentation

## 2012-08-24 DIAGNOSIS — F489 Nonpsychotic mental disorder, unspecified: Secondary | ICD-10-CM | POA: Insufficient documentation

## 2012-08-24 DIAGNOSIS — Z859 Personal history of malignant neoplasm, unspecified: Secondary | ICD-10-CM | POA: Insufficient documentation

## 2012-08-24 DIAGNOSIS — Z79899 Other long term (current) drug therapy: Secondary | ICD-10-CM | POA: Insufficient documentation

## 2012-08-24 DIAGNOSIS — E1169 Type 2 diabetes mellitus with other specified complication: Secondary | ICD-10-CM | POA: Insufficient documentation

## 2012-08-24 DIAGNOSIS — F79 Unspecified intellectual disabilities: Secondary | ICD-10-CM | POA: Insufficient documentation

## 2012-08-24 DIAGNOSIS — F411 Generalized anxiety disorder: Secondary | ICD-10-CM | POA: Insufficient documentation

## 2012-08-24 LAB — CBC WITH DIFFERENTIAL/PLATELET
Basophils Absolute: 0 10*3/uL (ref 0.0–0.1)
Basophils Relative: 1 % (ref 0–1)
Eosinophils Absolute: 0.5 10*3/uL (ref 0.0–0.7)
HCT: 46.6 % (ref 39.0–52.0)
MCHC: 33.5 g/dL (ref 30.0–36.0)
Monocytes Absolute: 0.6 10*3/uL (ref 0.1–1.0)
Neutro Abs: 3.5 10*3/uL (ref 1.7–7.7)
RDW: 13.7 % (ref 11.5–15.5)

## 2012-08-24 LAB — URINALYSIS, ROUTINE W REFLEX MICROSCOPIC
Bilirubin Urine: NEGATIVE
Hgb urine dipstick: NEGATIVE
Nitrite: NEGATIVE
Specific Gravity, Urine: 1.021 (ref 1.005–1.030)
pH: 7 (ref 5.0–8.0)

## 2012-08-24 LAB — BASIC METABOLIC PANEL
Calcium: 9.2 mg/dL (ref 8.4–10.5)
Chloride: 104 mEq/L (ref 96–112)
Creatinine, Ser: 1.86 mg/dL — ABNORMAL HIGH (ref 0.50–1.35)
GFR calc Af Amer: 46 mL/min — ABNORMAL LOW (ref >90)
GFR calc non Af Amer: 40 mL/min — ABNORMAL LOW (ref >90)

## 2012-08-24 LAB — RAPID URINE DRUG SCREEN, HOSP PERFORMED
Barbiturates: NOT DETECTED
Benzodiazepines: NOT DETECTED
Cocaine: NOT DETECTED
Tetrahydrocannabinol: NOT DETECTED

## 2012-08-24 NOTE — ED Notes (Signed)
[id number]<AV> Expected date:<BR> Expected time:<BR> Means of arrival:<BR> Comments:<BR> EMS: 53y/o hypoglycemic

## 2012-08-24 NOTE — ED Provider Notes (Signed)
CSN: [id number]     Arrival date & time 08/24/12  1942 History     First MD Initiated Contact with Patient 08/24/12 2011     Chief Complaint  Patient presents with  . Altered Mental Status   (Consider location/radiation/quality/duration/timing/severity/associated sxs/prior Treatment) HPI Comments: Patient is a 52 year old male with a history of mental retardation and schizoaffective disorder brought in today by EMS from his group home for an episode of hypoglycemia. It was found by EMS that the group home had not measured his blood sugar, but he was reportedly not eating all day and refusing all of his medications. EMS found a blood sugar of 108. His blood sugar here is 143. He is happily eating and drinking in his room currently. He does not complain of any pain. On interview he states "CJ is really getting on my nerves". He wants to pack up all his belongings in move out of his group home. He denies any suicidal or homicidal ideations.   The history is provided by the patient. No language interpreter was used.    Past Medical History  Diagnosis Date  . Diabetes mellitus   . Anxiety   . Hypertension   . Hyperlipemia   . Mental disorder   . Schizoaffective disorder   . Impulsive control disorder  . Pancreatitis   . Otitis media   . Cancer   . HOH (hard of hearing)   . Tourette's disease   . Mental retardation    Past Surgical History  Procedure Laterality Date  . 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 High blood pressure Sister   . High Cholesterol Sister   . Heart disease Mother   . Stroke Mother    History  Substance Use Topics  . Smoking status: Never Smoker   . Smokeless tobacco: Never Used  . Alcohol Use: No    Review of Systems  Unable to perform ROS: Psychiatric disorder    Allergies  Review of patient's allergies indicates no known allergies.  Home Medications   Current Outpatient Rx   Name  Route  Sig  Dispense  Refill  . amLODipine (NORVASC) 5 MG tablet   Oral   Take 5 mg by mouth every morning.          Marland Kitchen amoxicillin (AMOXIL) 500 MG capsule   Oral   Take 500 mg by mouth 3 (three) times daily.         Marland Kitchen aspirin EC 81 MG tablet   Oral   Take 81 mg by mouth every morning.          Marland Kitchen buPROPion (WELLBUTRIN XL) 150 MG 24 hr tablet   Oral   Take 300 mg by mouth every morning.          . cholecalciferol (VITAMIN D) 1000 UNITS tablet   Oral   Take 1,000 Units by mouth every morning.          . dicyclomine (BENTYL) 20 MG tablet   Oral   Take 20 mg by mouth 2 (two) times daily.          . divalproex (DEPAKOTE ER) 500 MG 24 hr tablet   Oral   Take 500 mg by mouth 2 (two) times daily.         Marland Kitchen gabapentin (NEURONTIN) 300 MG capsule   Oral   Take 300 mg by  mouth at bedtime.         . insulin regular (HUMULIN R) 100 units/mL injection   Subcutaneous   Inject 2-12 Units into the skin 3 (three) times daily as needed for high blood sugar. Sliding scale         . loratadine (CLARITIN) 10 MG tablet   Oral   Take 10 mg by mouth every morning.          . metoCLOPramide (REGLAN) 10 MG tablet   Oral   Take 10 mg by mouth 4 (four) times daily -  before meals and at bedtime.          . metoprolol tartrate (LOPRESSOR) 25 MG tablet   Oral   Take 12.5 mg by mouth 2 (two) times daily.         . naproxen (NAPROSYN) 500 MG tablet   Oral   Take 1 tablet (500 mg total) by mouth 2 (two) times daily with a meal.   30 tablet   0   . omeprazole (PRILOSEC) 20 MG capsule   Oral   Take 20 mg by mouth every morning.          . polyethylene glycol (MIRALAX / GLYCOLAX) packet   Oral   Take 17 g by mouth every morning.          . pravastatin (PRAVACHOL) 20 MG tablet   Oral   Take 20 mg by mouth at bedtime.          Marland Kitchen QUEtiapine (SEROQUEL) 100 MG tablet   Oral   Take 100 mg by mouth 2 (two) times daily.         . QUEtiapine (SEROQUEL)  200 MG tablet   Oral   Take 600 mg by mouth at bedtime.         Marland Kitchen testosterone cypionate (DEPOTESTOTERONE CYPIONATE) 100 MG/ML injection   Intramuscular   Inject 100 mg into the muscle every 28 (twenty-eight) days. For IM use only         . vitamin B-12 (CYANOCOBALAMIN) 1000 MCG tablet   Oral   Take 1,000 mcg by mouth every morning.           BP 121/93  Pulse 87  Temp(Src) 98.3 F (36.8 C) (Oral)  Resp 20  SpO2 100% Physical Exam  Nursing note and vitals reviewed. Constitutional: He is oriented to person, place, and time. He appears well-developed and well-nourished. He does not appear ill. No distress.  Eating and drinking happily in department Laying in bed comfortably  HENT:  Head: Normocephalic and atraumatic.  Right Ear: External ear normal.  Left Ear: External ear normal.  Nose: Nose normal.  Eyes: Conjunctivae are normal.  Neck: Normal range of motion. No tracheal deviation present.  Cardiovascular: Normal rate, regular rhythm and normal heart sounds.   Pulmonary/Chest: Effort normal and breath sounds normal. No stridor.  Abdominal: Soft. He exhibits no distension. There is no tenderness.  Musculoskeletal: Normal range of motion.  Neurological: He is alert and oriented to person, place, and time.  Skin: Skin is warm and dry. He is not diaphoretic.  Psychiatric: He is not actively hallucinating. Cognition and memory are impaired. He expresses no homicidal and no suicidal ideation.  The patient is irritable, but cooperative    ED Course   Procedures (including critical care time)  9:41 PM Discussed case with [facility] who state the reason they sent him over was because he was "ranting and raving" and  refusing to eat. They wanted to be sure he was not getting sick. They are willing to take him back as soon as he is medically cleared.    Labs Reviewed  CBC WITH DIFFERENTIAL - Abnormal; Notable for the following:    Eosinophils Relative 9  (*)    All other components within normal limits  BASIC METABOLIC PANEL - Abnormal; Notable for the following:    Glucose, Bld 142 (*)    Creatinine, Ser 1.86 (*)    GFR calc non Af Amer 40 (*)    GFR calc Af Amer 46 (*)    All other components within normal limits  URINALYSIS, ROUTINE W REFLEX MICROSCOPIC  URINE RAPID DRUG SCREEN (HOSP PERFORMED)   No results found. 1. Mental retardation   2. Schizoaffective disorder     MDM  Patient brought in from group home for medical clearance. History of mental retardation and schizoaffective disorder. Small bump in creatinine today. Labs are otherwise unremarkable. Patient is medically cleared for return to group home. He denies suicidal and homicidal ideations. He is cooperative in the department.  Discussed case with Dr. Fredderick Phenix who agrees with plan. Vital signs stable for transfer. Patient / Family / Caregiver informed of clinical course, understand medical decision-making process, and agree with plan.   Mora Bellman, PA-C 08/25/12 [phone]

## 2012-08-24 NOTE — ED Notes (Signed)
Per EMS, pt from [facility].  Pt has MR, schizophrenia, bipolar.  Per facility, pt has been more combative today and it was d/t hypoglycemia. Pt hasn't eaten all day.  No treatment for low sugar mentioned at facility.  Pt refused all meds.   EMS states pt not combative in route.  CBG 108.  Pt did make statement about "killing himself" in route.  States he does not like the group home that he is in.  Vitals: 128/88, hr 96, resp 18.

## 2012-08-24 NOTE — ED Notes (Signed)
Pt given sandwich, crackers and soda to eat.

## 2012-08-24 NOTE — ED Notes (Signed)
Per EMS, pt expressed SI in route.  In assessment, pt denies and says he wants to live.  Upset with facility and wants to go home.  Mother died and he can't live at home.  Has a sister.  Mentioned visiting her and staying with her.

## 2012-08-25 ENCOUNTER — Encounter (HOSPITAL_COMMUNITY): Payer: Self-pay

## 2012-08-25 ENCOUNTER — Emergency Department (HOSPITAL_COMMUNITY)
Admission: EM | Admit: 2012-08-25 | Discharge: 2012-08-25 | Disposition: A | Discharge status: Home / Self Care | Payer: Medicare Other | Attending: Emergency Medicine | Admitting: Emergency Medicine

## 2012-08-25 DIAGNOSIS — E785 Hyperlipidemia, unspecified: Secondary | ICD-10-CM | POA: Insufficient documentation

## 2012-08-25 DIAGNOSIS — F79 Unspecified intellectual disabilities: Secondary | ICD-10-CM | POA: Insufficient documentation

## 2012-08-25 DIAGNOSIS — H9190 Unspecified hearing loss, unspecified ear: Secondary | ICD-10-CM | POA: Insufficient documentation

## 2012-08-25 DIAGNOSIS — F411 Generalized anxiety disorder: Secondary | ICD-10-CM | POA: Insufficient documentation

## 2012-08-25 DIAGNOSIS — K5900 Constipation, unspecified: Secondary | ICD-10-CM | POA: Insufficient documentation

## 2012-08-25 DIAGNOSIS — Z8589 Personal history of malignant neoplasm of other organs and systems: Secondary | ICD-10-CM | POA: Insufficient documentation

## 2012-08-25 DIAGNOSIS — E119 Type 2 diabetes mellitus without complications: Secondary | ICD-10-CM | POA: Insufficient documentation

## 2012-08-25 DIAGNOSIS — Z7982 Long term (current) use of aspirin: Secondary | ICD-10-CM | POA: Insufficient documentation

## 2012-08-25 DIAGNOSIS — Z79899 Other long term (current) drug therapy: Secondary | ICD-10-CM | POA: Insufficient documentation

## 2012-08-25 DIAGNOSIS — H6690 Otitis media, unspecified, unspecified ear: Secondary | ICD-10-CM | POA: Insufficient documentation

## 2012-08-25 DIAGNOSIS — Z8659 Personal history of other mental and behavioral disorders: Secondary | ICD-10-CM | POA: Insufficient documentation

## 2012-08-25 DIAGNOSIS — F489 Nonpsychotic mental disorder, unspecified: Secondary | ICD-10-CM | POA: Insufficient documentation

## 2012-08-25 DIAGNOSIS — F259 Schizoaffective disorder, unspecified: Secondary | ICD-10-CM

## 2012-08-25 DIAGNOSIS — Z8719 Personal history of other diseases of the digestive system: Secondary | ICD-10-CM | POA: Insufficient documentation

## 2012-08-25 LAB — COMPREHENSIVE METABOLIC PANEL
AST: 22 U/L (ref 0–37)
Albumin: 4.1 g/dL (ref 3.5–5.2)
Alkaline Phosphatase: 69 U/L (ref 39–117)
Chloride: 101 mEq/L (ref 96–112)
Potassium: 3.5 mEq/L (ref 3.5–5.1)
Sodium: 139 mEq/L (ref 135–145)
Total Bilirubin: 0.3 mg/dL (ref 0.3–1.2)
Total Protein: 7.9 g/dL (ref 6.0–8.3)

## 2012-08-25 LAB — RAPID URINE DRUG SCREEN, HOSP PERFORMED
Amphetamines: NOT DETECTED
Barbiturates: NOT DETECTED
Tetrahydrocannabinol: NOT DETECTED

## 2012-08-25 LAB — CBC WITH DIFFERENTIAL/PLATELET
Basophils Absolute: 0 10*3/uL (ref 0.0–0.1)
Basophils Relative: 0 % (ref 0–1)
Eosinophils Absolute: 0.2 10*3/uL (ref 0.0–0.7)
Hemoglobin: 16.2 g/dL (ref 13.0–17.0)
MCHC: 34.3 g/dL (ref 30.0–36.0)
Neutro Abs: 5.5 10*3/uL (ref 1.7–7.7)
Neutrophils Relative %: 72 % (ref 43–77)
Platelets: 280 10*3/uL (ref 150–400)
RDW: 13.9 % (ref 11.5–15.5)

## 2012-08-25 LAB — GLUCOSE, CAPILLARY
Glucose-Capillary: 103 mg/dL — ABNORMAL HIGH (ref 70–99)
Glucose-Capillary: 121 mg/dL — ABNORMAL HIGH (ref 70–99)

## 2012-08-25 MED ORDER — LORAZEPAM 1 MG PO TABS: 1.0000 mg | ORAL_TABLET | Freq: Three times a day (TID) | ORAL | Status: DC | PRN

## 2012-08-25 MED ORDER — ACETAMINOPHEN 325 MG PO TABS: 650.0000 mg | ORAL_TABLET | ORAL | Status: DC | PRN

## 2012-08-25 MED ORDER — IBUPROFEN 200 MG PO TABS: 600.0000 mg | ORAL_TABLET | Freq: Three times a day (TID) | ORAL | Status: DC | PRN

## 2012-08-25 NOTE — ED Notes (Signed)
Patient wanded by security and placed in blue scrubs. 
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
## 2012-08-25 NOTE — ED Notes (Signed)
Patient has one bag of belongings in locker 30. 
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
## 2012-08-25 NOTE — ED Notes (Signed)
Pt is awake and alert, pleasant and cooperative. Patient denies HI, SI AH or VH. Discharge vitals 125/89 HR 88 RR 16 and unlabored. Pt was transported back to group home/with group home staff. IVC was resended on 08/25/12 at 1530.  Safety was monitored and maintained.  Patient escorted to lobby without incident via [facility] . T.Melvyn Neth RN

## 2012-08-25 NOTE — ED Notes (Signed)
Pt is awake and alert, denies HI or SI. Pt is pleasant and cooperative, interacts well with staff and treatment team. Pt reports not able to get alone with group home staff. Will continue to monitor for safety.

## 2012-08-25 NOTE — Progress Notes (Signed)
CSW spoke with Larkin Ina who stated that patient can return. Pt transportation will be provided around 3:30pm.   .Catha Gosselin, LCSWA  [phone] .08/25/2012 1419pm

## 2012-08-25 NOTE — Progress Notes (Signed)
CSW left message for group home, [facility] at [phone]  . CSW also left message for Larkin Ina caregiver at [phone].  Per discussion with psychiatric pa, patient psychiatrically stable for dc back to group home.   Catha Gosselin, LCSWA  [phone] .08/25/2012 1328pm

## 2012-08-25 NOTE — ED Provider Notes (Signed)
Medical screening examination/treatment/procedure(s) were performed by non-physician practitioner and as supervising physician I was immediately available for consultation/collaboration.    , MD 08/25/12 1420 

## 2012-08-25 NOTE — ED Provider Notes (Signed)
CSN: [id number]     Arrival date & time 08/25/12  1111 History     First MD Initiated Contact with Patient 08/25/12 1128     Chief Complaint  Patient presents with  . Medical Clearance   (Consider location/radiation/quality/duration/timing/severity/associated sxs/prior Treatment) The history is provided by the patient. The history is limited by the condition of the patient.  pt from group home, hx MR, schizoaffective disorder, who is sent to ED from [facility], as pt sent there w refusal to take meds, not showering, decreased eating, and periods upset/agitation w staff and/or other residents.  Pt calm and alert in ED, cooperative, watching TV.  Pt denies feeling upset with or angry at staff or other residents. Denies thoughts of harm to self or others.  Pt states sometimes he takes his meds, other times he doesn't, some times he wants to eat, other times he doesn't.     Past Medical History  Diagnosis Date  . Diabetes mellitus   . Anxiety   . Hypertension   . Hyperlipemia   . Mental disorder   . Schizoaffective disorder   . Impulsive control disorder  . Pancreatitis   . Otitis media   . Cancer   . HOH (hard of hearing)   . Tourette's disease   . Mental retardation    Past Surgical History  Procedure Laterality Date  . 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 High blood pressure Sister   . High Cholesterol Sister   . Heart disease Mother   . Stroke Mother    History  Substance Use Topics  . Smoking status: Never Smoker   . Smokeless tobacco: Never Used  . Alcohol Use: No    Review of Systems  Unable to perform ROS: Psychiatric disorder  level 5 caveat  Allergies  Review of patient's allergies indicates no known allergies.  Home Medications   Current Outpatient Rx  Name  Route  Sig  Dispense  Refill  . amLODipine (NORVASC) 5 MG tablet   Oral   Take 5 mg by mouth every morning.          Marland Kitchen  aspirin EC 81 MG tablet   Oral   Take 81 mg by mouth every morning.          Marland Kitchen buPROPion (WELLBUTRIN XL) 150 MG 24 hr tablet   Oral   Take 300 mg by mouth every morning.          . cholecalciferol (VITAMIN D) 1000 UNITS tablet   Oral   Take 1,000 Units by mouth every morning.          . dicyclomine (BENTYL) 20 MG tablet   Oral   Take 20 mg by mouth 2 (two) times daily.          . divalproex (DEPAKOTE ER) 500 MG 24 hr tablet   Oral   Take 500 mg by mouth 2 (two) times daily.         Marland Kitchen gabapentin (NEURONTIN) 300 MG capsule   Oral   Take 300 mg by mouth at bedtime.         . insulin regular (HUMULIN R) 100 units/mL injection   Subcutaneous   Inject 2-12 Units into the skin 3 (three) times daily as needed for high blood sugar. Sliding scale         . loratadine (CLARITIN) 10 MG  tablet   Oral   Take 10 mg by mouth every morning.          . metoCLOPramide (REGLAN) 10 MG tablet   Oral   Take 10 mg by mouth 4 (four) times daily -  before meals and at bedtime.          . metoprolol tartrate (LOPRESSOR) 25 MG tablet   Oral   Take 12.5 mg by mouth 2 (two) times daily.         Marland Kitchen omeprazole (PRILOSEC) 20 MG capsule   Oral   Take 20 mg by mouth every morning.          . ondansetron (ZOFRAN) 4 MG tablet   Oral   Take 4 mg by mouth every 6 (six) hours as needed for nausea.         . polyethylene glycol (MIRALAX / GLYCOLAX) packet   Oral   Take 17 g by mouth every morning.          . pravastatin (PRAVACHOL) 20 MG tablet   Oral   Take 20 mg by mouth at bedtime.          Marland Kitchen QUEtiapine (SEROQUEL) 100 MG tablet   Oral   Take 100 mg by mouth 2 (two) times daily.         Marland Kitchen testosterone cypionate (DEPOTESTOTERONE CYPIONATE) 100 MG/ML injection   Intramuscular   Inject 100 mg into the muscle every 28 (twenty-eight) days. For IM use only         . vitamin B-12 (CYANOCOBALAMIN) 1000 MCG tablet   Oral   Take 1,000 mcg by mouth every morning.            BP 130/85  Pulse 85  Temp(Src) 97.5 F (36.4 C) (Oral)  Resp 16  SpO2 98% Physical Exam  Nursing note and vitals reviewed. Constitutional: He appears well-developed and well-nourished. No distress.  HENT:  Head: Atraumatic.  Mouth/Throat: Oropharynx is clear and moist.  Eyes: Pupils are equal, round, and reactive to light.  Neck: Neck supple. No tracheal deviation present.  Cardiovascular: Normal rate, regular rhythm, normal heart sounds and intact distal pulses.   Pulmonary/Chest: Effort normal and breath sounds normal. No accessory muscle usage. No respiratory distress.  Abdominal: Soft. He exhibits no distension. There is no tenderness.  Musculoskeletal: Normal range of motion. He exhibits no edema and no tenderness.  Neurological: He is alert.  Cooperative. Ambulates w steady gait.   Skin: Skin is warm and dry.  Psychiatric: He has a normal mood and affect.    ED Course   Procedures (including critical care time)  Results for orders placed during the hospital encounter of 08/25/12  CBC WITH DIFFERENTIAL      Result Value Range   WBC 7.7  4.0 - 10.5 K/uL   RBC 5.37  4.22 - 5.81 MIL/uL   Hemoglobin 16.2  13.0 - 17.0 g/dL   HCT 78.2  95.6 - 21.3 %   MCV 87.9  78.0 - 100.0 fL   MCH 30.2  26.0 - 34.0 pg   MCHC 34.3  30.0 - 36.0 g/dL   RDW 08.6  57.8 - 46.9 %   Platelets 280  150 - 400 K/uL   Neutrophils Relative % 72  43 - 77 %   Neutro Abs 5.5  1.7 - 7.7 K/uL   Lymphocytes Relative 14  12 - 46 %   Lymphs Abs 1.1  0.7 - 4.0 K/uL   Monocytes Relative  11  3 - 12 %   Monocytes Absolute 0.9  0.1 - 1.0 K/uL   Eosinophils Relative 2  0 - 5 %   Eosinophils Absolute 0.2  0.0 - 0.7 K/uL   Basophils Relative 0  0 - 1 %   Basophils Absolute 0.0  0.0 - 0.1 K/uL  COMPREHENSIVE METABOLIC PANEL      Result Value Range   Sodium 139  135 - 145 mEq/L   Potassium 3.5  3.5 - 5.1 mEq/L   Chloride 101  96 - 112 mEq/L   CO2 24  19 - 32 mEq/L   Glucose, Bld 108 (*) 70 - 99  mg/dL   BUN 17  6 - 23 mg/dL   Creatinine, Ser 1.61 (*) 0.50 - 1.35 mg/dL   Calcium 9.1  8.4 - 09.6 mg/dL   Total Protein 7.9  6.0 - 8.3 g/dL   Albumin 4.1  3.5 - 5.2 g/dL   AST 22  0 - 37 U/L   ALT 15  0 - 53 U/L   Alkaline Phosphatase 69  39 - 117 U/L   Total Bilirubin 0.3  0.3 - 1.2 mg/dL   GFR calc non Af Amer 43 (*) >90 mL/min   GFR calc Af Amer 50 (*) >90 mL/min  URINE RAPID DRUG SCREEN (HOSP PERFORMED)      Result Value Range   Opiates NONE DETECTED  NONE DETECTED   Cocaine NONE DETECTED  NONE DETECTED   Benzodiazepines NONE DETECTED  NONE DETECTED   Amphetamines NONE DETECTED  NONE DETECTED   Tetrahydrocannabinol NONE DETECTED  NONE DETECTED   Barbiturates NONE DETECTED  NONE DETECTED  ETHANOL      Result Value Range   Alcohol, Ethyl (B) <11  0 - 11 mg/dL  GLUCOSE, CAPILLARY      Result Value Range   Glucose-Capillary 121 (*) 70 - 99 mg/dL   Comment 1 Documented in Chart     Comment 2 Notify RN         MDM  Labs.  Reviewed nursing notes and prior charts for additional history.   Labs neg for acute process, cr c/w baseline.  Pt has had psychiatry eval, they indicate psych stable for d/c, no acute psychosis, no thoughts of harm to self or others.  Normal mood/affect.  Pt remains calm and cooperative.   Pt appears stable for d/c.   Will d/c to ecf/group home, close follow up with his therapist.  Suzi Roots, MD 08/25/12 1416

## 2012-08-26 ENCOUNTER — Emergency Department (HOSPITAL_COMMUNITY)
Admission: EM | Admit: 2012-08-26 | Discharge: 2012-08-27 | Disposition: A | Discharge status: Home / Self Care | Payer: Medicare Other | Attending: Emergency Medicine | Admitting: Emergency Medicine

## 2012-08-26 ENCOUNTER — Encounter (HOSPITAL_COMMUNITY): Payer: Self-pay

## 2012-08-26 ENCOUNTER — Emergency Department (HOSPITAL_COMMUNITY)
Admission: EM | Admit: 2012-08-26 | Discharge: 2012-08-26 | Disposition: A | Discharge status: Home / Self Care | Payer: Medicare Other | Attending: Emergency Medicine | Admitting: Emergency Medicine

## 2012-08-26 ENCOUNTER — Encounter (HOSPITAL_COMMUNITY): Payer: Self-pay | Admitting: Emergency Medicine

## 2012-08-26 DIAGNOSIS — F489 Nonpsychotic mental disorder, unspecified: Secondary | ICD-10-CM | POA: Insufficient documentation

## 2012-08-26 DIAGNOSIS — F79 Unspecified intellectual disabilities: Secondary | ICD-10-CM | POA: Insufficient documentation

## 2012-08-26 DIAGNOSIS — R109 Unspecified abdominal pain: Secondary | ICD-10-CM

## 2012-08-26 DIAGNOSIS — Z8659 Personal history of other mental and behavioral disorders: Secondary | ICD-10-CM | POA: Insufficient documentation

## 2012-08-26 DIAGNOSIS — F603 Borderline personality disorder: Secondary | ICD-10-CM

## 2012-08-26 DIAGNOSIS — Z8719 Personal history of other diseases of the digestive system: Secondary | ICD-10-CM | POA: Insufficient documentation

## 2012-08-26 DIAGNOSIS — Z9119 Patient's noncompliance with other medical treatment and regimen: Secondary | ICD-10-CM

## 2012-08-26 DIAGNOSIS — E119 Type 2 diabetes mellitus without complications: Secondary | ICD-10-CM | POA: Insufficient documentation

## 2012-08-26 DIAGNOSIS — Z794 Long term (current) use of insulin: Secondary | ICD-10-CM | POA: Insufficient documentation

## 2012-08-26 DIAGNOSIS — Z79899 Other long term (current) drug therapy: Secondary | ICD-10-CM | POA: Insufficient documentation

## 2012-08-26 DIAGNOSIS — F259 Schizoaffective disorder, unspecified: Secondary | ICD-10-CM | POA: Insufficient documentation

## 2012-08-26 DIAGNOSIS — R4689 Other symptoms and signs involving appearance and behavior: Secondary | ICD-10-CM

## 2012-08-26 DIAGNOSIS — F411 Generalized anxiety disorder: Secondary | ICD-10-CM | POA: Insufficient documentation

## 2012-08-26 DIAGNOSIS — I10 Essential (primary) hypertension: Secondary | ICD-10-CM | POA: Insufficient documentation

## 2012-08-26 DIAGNOSIS — Z8669 Personal history of other diseases of the nervous system and sense organs: Secondary | ICD-10-CM | POA: Insufficient documentation

## 2012-08-26 DIAGNOSIS — R110 Nausea: Secondary | ICD-10-CM | POA: Insufficient documentation

## 2012-08-26 DIAGNOSIS — H9190 Unspecified hearing loss, unspecified ear: Secondary | ICD-10-CM | POA: Insufficient documentation

## 2012-08-26 DIAGNOSIS — E785 Hyperlipidemia, unspecified: Secondary | ICD-10-CM | POA: Insufficient documentation

## 2012-08-26 DIAGNOSIS — Z859 Personal history of malignant neoplasm, unspecified: Secondary | ICD-10-CM | POA: Insufficient documentation

## 2012-08-26 DIAGNOSIS — G8929 Other chronic pain: Secondary | ICD-10-CM | POA: Insufficient documentation

## 2012-08-26 DIAGNOSIS — Z7982 Long term (current) use of aspirin: Secondary | ICD-10-CM | POA: Insufficient documentation

## 2012-08-26 DIAGNOSIS — R1084 Generalized abdominal pain: Secondary | ICD-10-CM | POA: Insufficient documentation

## 2012-08-26 DIAGNOSIS — F911 Conduct disorder, childhood-onset type: Secondary | ICD-10-CM | POA: Insufficient documentation

## 2012-08-26 HISTORY — DX: Other constipation: K59.09

## 2012-08-26 LAB — CBC WITH DIFFERENTIAL/PLATELET
Eosinophils Absolute: 0.3 10*3/uL (ref 0.0–0.7)
Eosinophils Relative: 5 % (ref 0–5)
HCT: 44.6 % (ref 39.0–52.0)
Lymphs Abs: 1.2 10*3/uL (ref 0.7–4.0)
MCH: 29.8 pg (ref 26.0–34.0)
MCV: 87.5 fL (ref 78.0–100.0)
Monocytes Absolute: 0.9 10*3/uL (ref 0.1–1.0)
Platelets: 258 10*3/uL (ref 150–400)
RBC: 5.1 MIL/uL (ref 4.22–5.81)
RDW: 13.9 % (ref 11.5–15.5)

## 2012-08-26 LAB — COMPREHENSIVE METABOLIC PANEL
ALT: 13 U/L (ref 0–53)
Calcium: 9.3 mg/dL (ref 8.4–10.5)
Creatinine, Ser: 1.67 mg/dL — ABNORMAL HIGH (ref 0.50–1.35)
GFR calc Af Amer: 52 mL/min — ABNORMAL LOW (ref >90)
GFR calc non Af Amer: 45 mL/min — ABNORMAL LOW (ref >90)
Glucose, Bld: 105 mg/dL — ABNORMAL HIGH (ref 70–99)
Sodium: 137 mEq/L (ref 135–145)
Total Protein: 7.5 g/dL (ref 6.0–8.3)

## 2012-08-26 LAB — RAPID URINE DRUG SCREEN, HOSP PERFORMED
Amphetamines: NOT DETECTED
Opiates: NOT DETECTED

## 2012-08-26 LAB — GLUCOSE, CAPILLARY
Glucose-Capillary: 166 mg/dL — ABNORMAL HIGH (ref 70–99)
Glucose-Capillary: 101 mg/dL — ABNORMAL HIGH (ref 70–99)

## 2012-08-26 LAB — LIPASE, BLOOD: Lipase: 37 U/L (ref 11–59)

## 2012-08-26 LAB — ETHANOL: Alcohol, Ethyl (B): <11 mg/dL (ref 0–11)

## 2012-08-26 LAB — CG4 I-STAT (LACTIC ACID): Lactic Acid, Venous: 0.89 mmol/L (ref 0.5–2.2)

## 2012-08-26 MED ORDER — NICOTINE 21 MG/24HR TD PT24: 21.0000 mg | MEDICATED_PATCH | Freq: Every day | TRANSDERMAL | Status: DC

## 2012-08-26 MED ORDER — IBUPROFEN 200 MG PO TABS: 600.0000 mg | ORAL_TABLET | Freq: Three times a day (TID) | ORAL | Status: DC | PRN

## 2012-08-26 MED ORDER — POLYETHYLENE GLYCOL 3350 17 G PO PACK
17.0000 g | PACK | Freq: Every morning | ORAL | Status: DC
  Administered 2012-08-27: 17 g via ORAL
  Filled 2012-08-26: qty 1

## 2012-08-26 MED ORDER — ONDANSETRON 4 MG PO TBDP
4.0000 mg | ORAL_TABLET | Freq: Once | ORAL | Status: AC
  Administered 2012-08-26: 4 mg via ORAL
  Filled 2012-08-26: qty 1

## 2012-08-26 MED ORDER — ONDANSETRON HCL 4 MG PO TABS: 4.0000 mg | ORAL_TABLET | Freq: Four times a day (QID) | ORAL | Status: DC | PRN

## 2012-08-26 MED ORDER — INSULIN ASPART 100 UNIT/ML ~~LOC~~ SOLN: 2.0000 [IU] | Freq: Three times a day (TID) | SUBCUTANEOUS | Status: DC

## 2012-08-26 MED ORDER — GABAPENTIN 300 MG PO CAPS
300.0000 mg | ORAL_CAPSULE | Freq: Every day | ORAL | Status: DC
  Administered 2012-08-26: 300 mg via ORAL
  Filled 2012-08-26 (×2): qty 1

## 2012-08-26 MED ORDER — VITAMIN D3 25 MCG (1000 UNIT) PO TABS
1000.0000 [IU] | ORAL_TABLET | Freq: Every morning | ORAL | Status: DC
  Administered 2012-08-27: 1000 [IU] via ORAL
  Filled 2012-08-26: qty 1

## 2012-08-26 MED ORDER — DIVALPROEX SODIUM ER 500 MG PO TB24
500.0000 mg | ORAL_TABLET | Freq: Two times a day (BID) | ORAL | Status: DC
  Administered 2012-08-26 – 2012-08-27 (×2): 500 mg via ORAL
  Filled 2012-08-26 (×3): qty 1

## 2012-08-26 MED ORDER — BUPROPION HCL ER (XL) 300 MG PO TB24
300.0000 mg | ORAL_TABLET | Freq: Every morning | ORAL | Status: DC
  Administered 2012-08-26: 300 mg via ORAL
  Filled 2012-08-26: qty 1

## 2012-08-26 MED ORDER — QUETIAPINE FUMARATE 100 MG PO TABS
100.0000 mg | ORAL_TABLET | Freq: Two times a day (BID) | ORAL | Status: DC
  Administered 2012-08-26 – 2012-08-27 (×2): 100 mg via ORAL
  Filled 2012-08-26 (×2): qty 1

## 2012-08-26 MED ORDER — LORAZEPAM 1 MG PO TABS: 1.0000 mg | ORAL_TABLET | Freq: Three times a day (TID) | ORAL | Status: DC | PRN

## 2012-08-26 MED ORDER — TESTOSTERONE CYPIONATE 100 MG/ML IM SOLN: 100.0000 mg | INTRAMUSCULAR | Status: DC

## 2012-08-26 MED ORDER — METOPROLOL TARTRATE 25 MG PO TABS
12.5000 mg | ORAL_TABLET | Freq: Two times a day (BID) | ORAL | Status: DC
  Administered 2012-08-26 – 2012-08-27 (×2): 12.5 mg via ORAL
  Filled 2012-08-26 (×2): qty 1

## 2012-08-26 MED ORDER — SIMVASTATIN 10 MG PO TABS
10.0000 mg | ORAL_TABLET | Freq: Every day | ORAL | Status: DC
  Administered 2012-08-26 – 2012-08-27 (×2): 10 mg via ORAL
  Filled 2012-08-26 (×2): qty 1

## 2012-08-26 MED ORDER — METOCLOPRAMIDE HCL 10 MG PO TABS
10.0000 mg | ORAL_TABLET | Freq: Three times a day (TID) | ORAL | Status: DC
  Administered 2012-08-26 – 2012-08-27 (×4): 10 mg via ORAL
  Filled 2012-08-26 (×4): qty 1

## 2012-08-26 MED ORDER — VITAMIN B-12 1000 MCG PO TABS
1000.0000 ug | ORAL_TABLET | Freq: Every morning | ORAL | Status: DC
  Administered 2012-08-27: 1000 ug via ORAL
  Filled 2012-08-26: qty 1

## 2012-08-26 MED ORDER — DICYCLOMINE HCL 20 MG PO TABS
20.0000 mg | ORAL_TABLET | Freq: Two times a day (BID) | ORAL | Status: DC
  Administered 2012-08-26 – 2012-08-27 (×2): 20 mg via ORAL
  Filled 2012-08-26 (×2): qty 1

## 2012-08-26 MED ORDER — ASPIRIN EC 81 MG PO TBEC
81.0000 mg | DELAYED_RELEASE_TABLET | Freq: Every morning | ORAL | Status: DC
  Administered 2012-08-26: 81 mg via ORAL
  Filled 2012-08-26: qty 1

## 2012-08-26 MED ORDER — ONDANSETRON HCL 4 MG PO TABS: 4.0000 mg | ORAL_TABLET | Freq: Three times a day (TID) | ORAL | Status: DC | PRN

## 2012-08-26 MED ORDER — ZOLPIDEM TARTRATE 5 MG PO TABS: 5.0000 mg | ORAL_TABLET | Freq: Every evening | ORAL | Status: DC | PRN

## 2012-08-26 MED ORDER — PANTOPRAZOLE SODIUM 40 MG PO TBEC
40.0000 mg | DELAYED_RELEASE_TABLET | Freq: Every day | ORAL | Status: DC
  Administered 2012-08-26 – 2012-08-27 (×2): 40 mg via ORAL
  Filled 2012-08-26: qty 1

## 2012-08-26 MED ORDER — AMLODIPINE BESYLATE 5 MG PO TABS
5.0000 mg | ORAL_TABLET | Freq: Every morning | ORAL | Status: DC
  Administered 2012-08-26: 5 mg via ORAL
  Filled 2012-08-26: qty 1

## 2012-08-26 MED ORDER — LORATADINE 10 MG PO TABS
10.0000 mg | ORAL_TABLET | Freq: Every morning | ORAL | Status: DC
  Administered 2012-08-27: 10 mg via ORAL
  Filled 2012-08-26: qty 1

## 2012-08-26 MED ORDER — ACETAMINOPHEN 325 MG PO TABS: 650.0000 mg | ORAL_TABLET | ORAL | Status: DC | PRN

## 2012-08-26 NOTE — ED Notes (Addendum)
Spoke with pt sister. She states pt has been so hostile, his room mate has left the group home. The other patients have been unable to sleep for the past 3 days because he has been keeping everyone awake with his behavior. Pt does not take his medication at the group home and has not been eating. Pt family feels he did better at [facility] in [place] last time his behavior was like this. Pt family says he was admitted there for 1 month and began taking his medications and his behavior was much better. They would like him to try to be placed there if possible.

## 2012-08-26 NOTE — ED Notes (Addendum)
Pt brought in by [employer] from [facility].  Reports pt is acting out aggressively slamming doors, throwing items around, cursing, screaming, and hollering.  Pt is IVC'd.  [facility] refused pt due pt having too many medical conditions and not enough space.  Pt was seen here yesterday.  Pt also c/o abd pain.

## 2012-08-26 NOTE — Progress Notes (Signed)
CSW spoke with Larkin Ina who states they are on the way to come pick up patient to return to group home. Pt transportation will be provided around 1230-1pm   .Catha Gosselin, Theresia Majors  [phone] .08/26/2012 11:49am

## 2012-08-26 NOTE — ED Provider Notes (Signed)
CSN: [id number]     Arrival date & time 08/26/12  0825 History     First MD Initiated Contact with Patient 08/26/12 [phone]     Chief Complaint  Patient presents with  . Abdominal Pain   (Consider location/radiation/quality/duration/timing/severity/associated sxs/prior Treatment) HPI  Patient presents from a group home with report of inconsistent behavior. Patient himself complains only of chronic discomfort in his abdomen.  He denies confusion, disorientation, fever, chest pain, difficulty breathing. He does state that his abdomen hurts all over, and has done so for"a long time". He states that he eats what he wants to, has persistent nausea, but has not been vomiting. No diarrhea.   Patient has mental retardation, psychiatric disease.  Although this limits the history of present illness and review of systems somewhat, patient is answered questions appropriately him a seems to demonstrate appropriate short-term recall, and although he is repetitive, offers a mostly consistent history of his recent health condition.   Past Medical History  Diagnosis Date  . Diabetes mellitus   . Anxiety   . Hypertension   . Hyperlipemia   . Mental disorder   . Schizoaffective disorder   . Impulsive control disorder  . Pancreatitis   . Otitis media   . Cancer   . HOH (hard of hearing)   . Tourette's disease   . Mental retardation   . Chronic constipation    Past Surgical History  Procedure Laterality Date  . 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 High blood pressure Sister   . High Cholesterol Sister   . Heart disease Mother   . Stroke Mother    History  Substance Use Topics  . Smoking status: Never Smoker   . Smokeless tobacco: Never Used  . Alcohol Use: No    Review of Systems  Unable to perform ROS: Psychiatric disorder    Allergies  Review of patient's allergies indicates no known allergies.  Home  Medications   Current Outpatient Rx  Name  Route  Sig  Dispense  Refill  . amLODipine (NORVASC) 5 MG tablet   Oral   Take 5 mg by mouth every morning.          Marland Kitchen aspirin EC 81 MG tablet   Oral   Take 81 mg by mouth every morning.          Marland Kitchen buPROPion (WELLBUTRIN XL) 150 MG 24 hr tablet   Oral   Take 300 mg by mouth every morning.          . cholecalciferol (VITAMIN D) 1000 UNITS tablet   Oral   Take 1,000 Units by mouth every morning.          . dicyclomine (BENTYL) 20 MG tablet   Oral   Take 20 mg by mouth 2 (two) times daily.          . divalproex (DEPAKOTE ER) 500 MG 24 hr tablet   Oral   Take 500 mg by mouth 2 (two) times daily.         Marland Kitchen gabapentin (NEURONTIN) 300 MG capsule   Oral   Take 300 mg by mouth at bedtime.         . insulin regular (HUMULIN R) 100 units/mL injection   Subcutaneous   Inject 2-12 Units into the skin 3 (three) times daily as needed for high blood sugar. Sliding scale         .  loratadine (CLARITIN) 10 MG tablet   Oral   Take 10 mg by mouth every morning.          . metoCLOPramide (REGLAN) 10 MG tablet   Oral   Take 10 mg by mouth 4 (four) times daily -  before meals and at bedtime.          . metoprolol tartrate (LOPRESSOR) 25 MG tablet   Oral   Take 12.5 mg by mouth 2 (two) times daily.         Marland Kitchen omeprazole (PRILOSEC) 20 MG capsule   Oral   Take 20 mg by mouth every morning.          . ondansetron (ZOFRAN) 4 MG tablet   Oral   Take 4 mg by mouth every 6 (six) hours as needed for nausea.         . polyethylene glycol (MIRALAX / GLYCOLAX) packet   Oral   Take 17 g by mouth every morning.          . pravastatin (PRAVACHOL) 20 MG tablet   Oral   Take 20 mg by mouth at bedtime.          Marland Kitchen QUEtiapine (SEROQUEL) 100 MG tablet   Oral   Take 100 mg by mouth 2 (two) times daily.         Marland Kitchen testosterone cypionate (DEPOTESTOTERONE CYPIONATE) 100 MG/ML injection   Intramuscular   Inject 100 mg into  the muscle every 28 (twenty-eight) days. For IM use only         . vitamin B-12 (CYANOCOBALAMIN) 1000 MCG tablet   Oral   Take 1,000 mcg by mouth every morning.           BP 127/82  Pulse 77  Temp(Src) 98.3 F (36.8 C) (Oral)  Resp 16  SpO2 99% Physical Exam  Nursing note and vitals reviewed. Constitutional: He appears well-developed and well-nourished. No distress.  HENT:  Head: Atraumatic.  Mouth/Throat: Oropharynx is clear and moist.  Eyes: Pupils are equal, round, and reactive to light.  Neck: Neck supple. No tracheal deviation present.  Cardiovascular: Normal rate, regular rhythm, normal heart sounds and intact distal pulses.   Pulmonary/Chest: Effort normal and breath sounds normal. No accessory muscle usage. No respiratory distress.  Abdominal: Soft. He exhibits no distension. There is no hepatosplenomegaly. There is no tenderness. There is no rigidity, no rebound and no guarding.  Soft, non-peritoneal abdomen  Musculoskeletal: Normal range of motion. He exhibits no edema and no tenderness.  Neurological: He is alert.  Cooperative. Ambulates w steady gait.   Skin: Skin is warm and dry.  Psychiatric: He has a normal mood and affect.    ED Course   Procedures (including critical care time)  Labs Reviewed  CBC WITH DIFFERENTIAL  COMPREHENSIVE METABOLIC PANEL  LIPASE, BLOOD   No results found. No diagnosis found. Prior to the initial evaluation and following physical exam I reviewed the patient's chart, including visits over the past few days.  Attempted to contact nursing home staff.  And reviewed psychiatric meds.  Essentially, this patient has MR, schizoaffective disorder.   Yesterday's Psych note:  Assessment/Plan:  Recommend continue current psychiatric medications and follow-up with his regular provider, questionable testerone use. Patient does not need inpatient psychiatric treatment.   MDM  Patient presents from his group home with concerns of  atypical behavior.  On exam he is awake and alert, afebrile, with unremarkable vital signs.  The patient's psychiatric evaluation within the  past 24 hours.  No notable changes on exam, in comparison to yesterday given the patient's description of ongoing abdominal pain, he had a laboratory evaluation.  Patient with non-peritoneal, labs were reassuring.  Patient was discharged back to his facility with instructions to follow up with his outpatient psychiatrist.  Gerhard Munch, MD 08/26/12 1012

## 2012-08-26 NOTE — Consult Note (Signed)
Note reviewed and agreed with  

## 2012-08-26 NOTE — ED Notes (Signed)
[id number]<AV> Expected date:<BR> Expected time:<BR> Means of arrival:<BR> Comments:<BR> EMS-abdominal pain

## 2012-08-26 NOTE — ED Provider Notes (Signed)
CSN: [id number]     Arrival date & time 08/26/12  1733 History     First MD Initiated Contact with Patient 08/26/12 1739     Chief Complaint  Patient presents with  . Medical Clearance   (Consider location/radiation/quality/duration/timing/severity/associated sxs/prior Treatment) HPI Comments: Patient brought into ED under IVC by police.  Patient IVC'd by group home staff who state that patient has been violent and destructive at the group home.  Patient's sister who is also his caregiver states that he has been out of control for 4 days, has been to the hospital several times and needs to be institutionalized.  States he is throwing out his medications, he is not bathing.  States no one in the group home has slept in 4 days because he is awake screaming, playing guitar, has scared his roommate off.  Pt states his abdomen hurts. He was seen in the ED this morning for similar complaint.  Pt told nurse that his abdomen always hurts when he gets upset.  Denies SI, HI.  Denies being angry at anyone.    Level V caveat for mental illness and mental retardation.  The history is provided by the patient, a relative and medical records.    Past Medical History  Diagnosis Date  . Diabetes mellitus   . Anxiety   . Hypertension   . Hyperlipemia   . Mental disorder   . Schizoaffective disorder   . Impulsive control disorder  . Pancreatitis   . Otitis media   . Cancer   . HOH (hard of hearing)   . Tourette's disease   . Mental retardation   . Chronic constipation    Past Surgical History  Procedure Laterality Date  . 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 High blood pressure Sister   . High Cholesterol Sister   . Heart disease Mother   . Stroke Mother    History  Substance Use Topics  . Smoking status: Never Smoker   . Smokeless tobacco: Never Used  . Alcohol Use: No    Review of Systems  Unable to perform ROS:  Psychiatric disorder    Allergies  Review of patient's allergies indicates no known allergies.  Home Medications   Current Outpatient Rx  Name  Route  Sig  Dispense  Refill  . amLODipine (NORVASC) 5 MG tablet   Oral   Take 5 mg by mouth every morning.          Marland Kitchen aspirin EC 81 MG tablet   Oral   Take 81 mg by mouth every morning.          Marland Kitchen buPROPion (WELLBUTRIN XL) 150 MG 24 hr tablet   Oral   Take 300 mg by mouth every morning.          . cholecalciferol (VITAMIN D) 1000 UNITS tablet   Oral   Take 1,000 Units by mouth every morning.          . dicyclomine (BENTYL) 20 MG tablet   Oral   Take 20 mg by mouth 2 (two) times daily.          . divalproex (DEPAKOTE ER) 500 MG 24 hr tablet   Oral   Take 500 mg by mouth 2 (two) times daily.         Marland Kitchen gabapentin (NEURONTIN) 300 MG capsule   Oral   Take  300 mg by mouth at bedtime.         . insulin regular (HUMULIN R) 100 units/mL injection   Subcutaneous   Inject 2-12 Units into the skin 3 (three) times daily as needed for high blood sugar. Sliding scale         . loratadine (CLARITIN) 10 MG tablet   Oral   Take 10 mg by mouth every morning.          . metoCLOPramide (REGLAN) 10 MG tablet   Oral   Take 10 mg by mouth 4 (four) times daily -  before meals and at bedtime.          . metoprolol tartrate (LOPRESSOR) 25 MG tablet   Oral   Take 12.5 mg by mouth 2 (two) times daily.         Marland Kitchen omeprazole (PRILOSEC) 20 MG capsule   Oral   Take 20 mg by mouth every morning.          . ondansetron (ZOFRAN) 4 MG tablet   Oral   Take 4 mg by mouth every 6 (six) hours as needed for nausea.         . polyethylene glycol (MIRALAX / GLYCOLAX) packet   Oral   Take 17 g by mouth every morning.          . pravastatin (PRAVACHOL) 20 MG tablet   Oral   Take 20 mg by mouth at bedtime.          Marland Kitchen QUEtiapine (SEROQUEL) 100 MG tablet   Oral   Take 100 mg by mouth 2 (two) times daily.         Marland Kitchen  testosterone cypionate (DEPOTESTOTERONE CYPIONATE) 100 MG/ML injection   Intramuscular   Inject 100 mg into the muscle every 28 (twenty-eight) days. For IM use only         . vitamin B-12 (CYANOCOBALAMIN) 1000 MCG tablet   Oral   Take 1,000 mcg by mouth every morning.           There were no vitals taken for this visit. Physical Exam  Nursing note and vitals reviewed. Constitutional: He appears well-developed and well-nourished. No distress.  HENT:  Head: Normocephalic and atraumatic.  Neck: Neck supple.  Cardiovascular: Normal rate and regular rhythm.   Pulmonary/Chest: Effort normal and breath sounds normal. No respiratory distress. He has no wheezes. He has no rales.  Abdominal: Soft. Bowel sounds are normal. He exhibits no distension and no mass. There is no tenderness. There is no rebound and no guarding.  obese  Neurological: He is alert. He exhibits normal muscle tone.  Skin: He is not diaphoretic.  Psychiatric: His speech is delayed. He is slowed.    ED Course   Procedures (including critical care time)  Labs Reviewed  GLUCOSE, CAPILLARY - Abnormal; Notable for the following:    Glucose-Capillary 166 (*)    All other components within normal limits  GLUCOSE, CAPILLARY - Abnormal; Notable for the following:    Glucose-Capillary 101 (*)    All other components within normal limits  ETHANOL  URINE RAPID DRUG SCREEN (HOSP PERFORMED)   No results found.  6:53 PM I spoke with [place], ACT team.   1. Mental retardation   2. Aggressive behavior of adult   3. Schizoaffective disorder     MDM  Pt well known to the ED with mental retardation and multiple psychiatric problems brought to the ED under IVC for violent and disruptive behavior.  Sister also supports this plan and wants patient institutionalized because he is unable to take care of himself and group home seems unable to take care of him.  Discussed with ACT team Jessie Foot).  Pt to have telepsych consult and ACT  to work on possible placement in new group home.   [place], PA-C 08/27/12 0121

## 2012-08-26 NOTE — ED Notes (Signed)
Poor historian. Has MR, and hard of hearing. C/o generalized abdominal pain, "been hurting all morning, sometimes want to throw up". Unable to obtain any more information from this patient. Was seen here yesterday for same complaint. Group home would like patient to be evaluated for Psychiatric. Vital sign stable. Pt appear in no distress.

## 2012-08-26 NOTE — ED Notes (Signed)
Pt belongings locked up in locker 40. Pt bag contains clothes and shoes.

## 2012-08-26 NOTE — ED Notes (Signed)
Patient resting in bed with no s/s of distress noted currently.

## 2012-08-26 NOTE — Progress Notes (Signed)
[facility] MD Progress Note  08/26/2012 9:31 PM Shawn Baird  MRN:  [id number] Subjective: Daily returns to ER 7/30;7/31;8/1 from Group Home.Pt reports problem with staff member there -wants to move out.Home reports pt has stopped taking meds and destabilized becoming behavioral problem.Pt is MR as well as schizoaffective .Has major medical problem with Diabetes Diagnosis:  Axis I: Schizoaffective Disorder in background of MR/Aggressive behavioral problem of adult/Non compliant with medication                     Axis II: Deferred                     Axis [id number]:[id number] See problems list; 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   HENT: Negative.   Eyes: Negative.   Respiratory: Negative.   Cardiovascular: Negative.   Gastrointestinal: Positive for abdominal pain.       Hx of GERD and Diabetic gastroparesis  Genitourinary: Negative.   Musculoskeletal: Negative.   Skin: Negative.   Neurological: Negative.   Endo/Heme/Allergies: Negative.        Diabetic  Psychiatric/Behavioral: Positive for hallucinations (reported by others-pt does not reference any himself). Negative for depression, suicidal ideas and substance abuse.       Pt has been to ER 3 days in a row because Group Home conflicts have him wanting to move out-he c/o conflict with 1 person by initial on 7/30.The Home claims pt refuses meds.This is a recurrent pattern over past 3 days for pt to be D/C to home only toend up back here.  Pt is well known to Laverle [facility] who has done Medication Management with his outside Psychiatrist.Her recommendation is pt return to [facility] where he was stabilized previously for restabilization.    Blood  pressure 108/71, pulse 83, temperature 98.5 F (36.9 C), temperature source Oral, resp. rate 18, SpO2 98.00%.There is no weight on file to calculate BMI.  General Appearance: Guarded  Eye Contact::  Good  Speech:  understanable but has impediment  Volume:  Normal  Mood:  Euthymic  Affect:  Flat  Thought Process:  Coherent  Orientation:  Full (Time, Place, and Person)  Thought Content:  unremarkable at this time but limited  Suicidal Thoughts:  No  Homicidal Thoughts:  No  Memory:  Negative  Judgement:  Poor  Insight:  Lacking  Psychomotor Activity:  Negative  Concentration:  Good  Recall:  Fair  Akathisia:  NA  Handed:  Right  AIMS (if indicated):     Assets:  Resilience  Sleep:      Current Medications: Current Facility-Administered Medications  Medication Dose Route Frequency Provider Last Rate Last Dose  . acetaminophen (TYLENOL) tablet 650 mg  650 mg Oral Q4H PRN Trixie Dredge, PA-C      . amLODipine (NORVASC) tablet 5 mg  5 mg Oral q morning - 10a Emily West, PA-C      . aspirin EC tablet 81 mg  81 mg Oral q morning -  10a Trixie Dredge, PA-C      . buPROPion (WELLBUTRIN XL) 24 hr tablet 300 mg  300 mg Oral q morning - 10a Emily West, PA-C      . [START ON 08/27/2012] cholecalciferol (VITAMIN D) tablet 1,000 Units  1,000 Units Oral q morning - 10a Emily West, PA-C      . dicyclomine (BENTYL) tablet 20 mg  20 mg Oral BID Emily West, PA-C      . divalproex (DEPAKOTE ER) 24 hr tablet 500 mg  500 mg Oral BID Emily West, PA-C      . gabapentin (NEURONTIN) capsule 300 mg  300 mg Oral QHS Emily West, PA-C      . ibuprofen (ADVIL,MOTRIN) tablet 600 mg  600 mg Oral Q8H PRN Trixie Dredge, PA-C      . [START ON 08/27/2012] insulin aspart (novoLOG) injection 2-12 Units  2-12 Units Subcutaneous TID WC Trixie Dredge, PA-C      . [START ON 08/27/2012] loratadine (CLARITIN) tablet 10 mg  10 mg Oral q morning - 10a Trixie Dredge, PA-C      . LORazepam (ATIVAN) tablet 1 mg  1 mg Oral Q8H PRN Trixie Dredge, PA-C       . metoCLOPramide (REGLAN) tablet 10 mg  10 mg Oral TID AC & HS Emily West, PA-C      . metoprolol tartrate (LOPRESSOR) tablet 12.5 mg  12.5 mg Oral BID Emily West, PA-C      . nicotine (NICODERM CQ - dosed in mg/24 hours) patch 21 mg  21 mg Transdermal Daily Emily West, PA-C      . ondansetron [facility]) tablet 4 mg  4 mg Oral Q8H PRN Trixie Dredge, PA-C      . ondansetron [facility]) tablet 4 mg  4 mg Oral Q6H PRN Emily West, PA-C      . pantoprazole (PROTONIX) EC tablet 40 mg  40 mg Oral Daily Emily West, PA-C      . [START ON 08/27/2012] polyethylene glycol (MIRALAX / GLYCOLAX) packet 17 g  17 g Oral q morning - 10a Emily West, PA-C      . QUEtiapine (SEROQUEL) tablet 100 mg  100 mg Oral BID Emily West, PA-C      . simvastatin (ZOCOR) tablet 10 mg  10 mg Oral q1800 Trixie Dredge, PA-C      . [START ON 08/27/2012] vitamin B-12 (CYANOCOBALAMIN) tablet 1,000 mcg  1,000 mcg Oral q morning - 10a Emily West, PA-C      . zolpidem (AMBIEN) tablet 5 mg  5 mg Oral QHS PRN Trixie Dredge, PA-C       Current Outpatient Prescriptions  Medication Sig Dispense Refill  . amLODipine (NORVASC) 5 MG tablet Take 5 mg by mouth every morning.       Marland Kitchen aspirin EC 81 MG tablet Take 81 mg by mouth every morning.       Marland Kitchen buPROPion (WELLBUTRIN XL) 150 MG 24 hr tablet Take 300 mg by mouth every morning.       . cholecalciferol (VITAMIN D) 1000 UNITS tablet Take 1,000 Units by mouth every morning.       . dicyclomine (BENTYL) 20 MG tablet Take 20 mg by mouth 2 (two) times daily.       . divalproex (DEPAKOTE ER) 500 MG 24 hr tablet Take 500 mg by mouth 2 (two) times daily.      Marland Kitchen gabapentin (NEURONTIN) 300 MG capsule Take 300 mg by mouth at bedtime.      Marland Kitchen  insulin regular (HUMULIN R) 100 units/mL injection Inject 2-12 Units into the skin 3 (three) times daily as needed for high blood sugar. Sliding scale      . loratadine (CLARITIN) 10 MG tablet Take 10 mg by mouth every morning.       . metoCLOPramide (REGLAN) 10 MG tablet Take 10 mg by  mouth 4 (four) times daily -  before meals and at bedtime.       . metoprolol tartrate (LOPRESSOR) 25 MG tablet Take 12.5 mg by mouth 2 (two) times daily.      Marland Kitchen omeprazole (PRILOSEC) 20 MG capsule Take 20 mg by mouth every morning.       . ondansetron (ZOFRAN) 4 MG tablet Take 4 mg by mouth every 6 (six) hours as needed for nausea.      . polyethylene glycol (MIRALAX / GLYCOLAX) packet Take 17 g by mouth every morning.       . pravastatin (PRAVACHOL) 20 MG tablet Take 20 mg by mouth at bedtime.       Marland Kitchen QUEtiapine (SEROQUEL) 100 MG tablet Take 100 mg by mouth 2 (two) times daily.      Marland Kitchen testosterone cypionate (DEPOTESTOTERONE CYPIONATE) 100 MG/ML injection Inject 100 mg into the muscle every 28 (twenty-eight) days. For IM use only      . vitamin B-12 (CYANOCOBALAMIN) 1000 MCG tablet Take 1,000 mcg by mouth every morning.         Lab Results:  Results for orders placed during the hospital encounter of 08/26/12 (from the past 48 hour(s))  ETHANOL     Status: None   Collection Time    08/26/12  6:19 PM      Result Value Range   Alcohol, Ethyl (B) <11  0 - 11 mg/dL   Comment:            LOWEST DETECTABLE LIMIT FOR     SERUM ALCOHOL IS 11 mg/dL     FOR MEDICAL PURPOSES ONLY  GLUCOSE, CAPILLARY     Status: Abnormal   Collection Time    08/26/12  6:47 PM      Result Value Range   Glucose-Capillary 166 (*) 70 - 99 mg/dL   Comment 1 Notify RN    URINE RAPID DRUG SCREEN (HOSP PERFORMED)     Status: None   Collection Time    08/26/12  6:49 PM      Result Value Range   Opiates NONE DETECTED  NONE DETECTED   Cocaine NONE DETECTED  NONE DETECTED   Benzodiazepines NONE DETECTED  NONE DETECTED   Amphetamines NONE DETECTED  NONE DETECTED   Tetrahydrocannabinol NONE DETECTED  NONE DETECTED   Barbiturates NONE DETECTED  NONE DETECTED   Comment:            DRUG SCREEN FOR MEDICAL PURPOSES     ONLY.  IF CONFIRMATION IS NEEDED     FOR ANY PURPOSE, NOTIFY LAB     WITHIN 5 DAYS.                 LOWEST DETECTABLE LIMITS     FOR URINE DRUG SCREEN     Drug Class       Cutoff (ng/mL)     Amphetamine      1000     Barbiturate      200     Benzodiazepine   200     Tricyclics       300     Opiates  300     Cocaine          300     THC              50    Physical Findings: AIMS:  , ,  ,  ,    CIWA:    COWS:     Treatment Plan Summary: Recommend pt return to [facility] for restabilization given he has MR and had good response to treatment there  Plan:  Medical Decision Making Problem Points:  Established problem, worsening (2) Data Points:  Review and summation of old records (2)  I certify that inpatient services furnished can reasonably be expected to improve the patient's condition.   Court Joy 08/26/2012, 9:31 PM

## 2012-08-27 DIAGNOSIS — F259 Schizoaffective disorder, unspecified: Secondary | ICD-10-CM

## 2012-08-27 LAB — GLUCOSE, CAPILLARY
Glucose-Capillary: 101 mg/dL — ABNORMAL HIGH (ref 70–99)
Glucose-Capillary: 121 mg/dL — ABNORMAL HIGH (ref 70–99)
Glucose-Capillary: 139 mg/dL — ABNORMAL HIGH (ref 70–99)

## 2012-08-27 MED ORDER — INSULIN ASPART 100 UNIT/ML ~~LOC~~ SOLN
0.0000 [IU] | Freq: Three times a day (TID) | SUBCUTANEOUS | Status: DC
  Administered 2012-08-27 (×2): 1 [IU] via SUBCUTANEOUS
  Filled 2012-08-27 (×2): qty 1

## 2012-08-27 NOTE — ED Notes (Signed)
Pt is awake and alert, pleasant and cooperative. Patient denies HI, SI AH or VH. Discharge vitals  97% RM 112/78-BP HR 76 and 16 unlabored. Pt has outpatient treatment scheduled. Saftey monitor and maintain. Pt escorted to lobby without incident. Pt was discharge with group home staff (Shawn Baird)

## 2012-08-27 NOTE — BH Assessment (Signed)
[facility] Assessment Progress Note   Pt appears to be at his baseline.  Pt denies SI, HI and SA.  Pt is hard of hearing, does not process well due to IDD (MR) and when he becomes angry or frustrated he makes threats and acts accordingly.  However, he is calm in the ED.    Pt is IVC and is being evaluated to determine if he meets criteria for IVC.  If placement is needed, SW will need to become involved.    Pt will not meet criteria for diversion for [facility].  Pt does not appear to meet criteria for [facility] as recommended by PA.  Pt needs a long term living environment that works the pt crisis plan when he becomes angry, confused, frustrated due to his limitations.

## 2012-08-27 NOTE — ED Notes (Addendum)
Shawn Baird and guardian-called upset that he was being D/C'd. Allowed her to vent her frustration. She thanked me and hung up.

## 2012-08-27 NOTE — Consult Note (Signed)
Reason for Consult: aggressive behavior Referring Physician: Dr Greig Right is an 52 y.o. male.  HPI: This 52 year old Afro-American man who has been in the emergency room multiple times due to issues with a staff member at the group home and other clients.  Patient is mentally retarded.  He also has a diagnosis of schizoaffective disorder.  This time patient was brought in because he was slamming the doors throwing items and cursing and screaming.  He sees Dr. Nolon Lennert and takes Seroquel and Depakote.  Patient denies any suicidal thoughts or homicidal talk however he continued to have issues with the staff at group home.  In the emergency room he was calm cooperative and pleasant.  However he has difficulty remembering things because of his MR.  He slept well last night, took his medication and reported no side effects.  Patient does not want to be admitted and rather solve his issue with the staff at group home I like to see his psychiatrist for his medication adjustment.  He did not require any intramuscular medication for agitation or aggression.  He follows the routine in the emergency room.    Mental status examination Patient is casually dressed and fairly groomed.  He maintains fair eye contact.  His speech is slow.  His thought process is also slow.  He denies any active or passive suicidal thoughts and homicidal thoughts.  He appears upset on his living situation but denies any aggression or violence.  He is limited to provide further information.  He denies any hallucination.  There were no paranoia or delusions present at this time.  Overall he is calm during the conversation.  He has difficulty remembering things.  His attention and concentration is poor.  His insight judgment and impulse control is fair.  Past Medical History  Diagnosis Date  . Diabetes mellitus   . Anxiety   . Hypertension   . Hyperlipemia   . Mental disorder   . Schizoaffective disorder   . Impulsive control  disorder  . Pancreatitis   . Otitis media   . Cancer   . HOH (hard of hearing)   . Tourette's disease   . Mental retardation   . Chronic constipation     Past Surgical History  Procedure Laterality Date  . 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 High blood pressure Sister   . High Cholesterol Sister   . Heart disease Mother   . Stroke Mother     Social History:  reports that he has never smoked. He has never used smokeless tobacco. He reports that he does not drink alcohol or use illicit drugs.  Allergies: No Known Allergies  Medications: I have reviewed the patient's current medications.  Results for orders placed during the hospital encounter of 08/26/12 (from the past 48 hour(s))  ETHANOL     Status: None   Collection Time    08/26/12  6:19 PM      Result Value Range   Alcohol, Ethyl (B) <11  0 - 11 mg/dL   Comment:            LOWEST DETECTABLE LIMIT FOR     SERUM ALCOHOL IS 11 mg/dL     FOR MEDICAL PURPOSES ONLY  GLUCOSE, CAPILLARY     Status: Abnormal   Collection Time    08/26/12  6:47 PM  Result Value Range   Glucose-Capillary 166 (*) 70 - 99 mg/dL   Comment 1 Notify RN    URINE RAPID DRUG SCREEN (HOSP PERFORMED)     Status: None   Collection Time    08/26/12  6:49 PM      Result Value Range   Opiates NONE DETECTED  NONE DETECTED   Cocaine NONE DETECTED  NONE DETECTED   Benzodiazepines NONE DETECTED  NONE DETECTED   Amphetamines NONE DETECTED  NONE DETECTED   Tetrahydrocannabinol NONE DETECTED  NONE DETECTED   Barbiturates NONE DETECTED  NONE DETECTED   Comment:            DRUG SCREEN FOR MEDICAL PURPOSES     ONLY.  IF CONFIRMATION IS NEEDED     FOR ANY PURPOSE, NOTIFY LAB     WITHIN 5 DAYS.                LOWEST DETECTABLE LIMITS     FOR URINE DRUG SCREEN     Drug Class       Cutoff (ng/mL)     Amphetamine      1000     Barbiturate      200     Benzodiazepine   200      Tricyclics       300     Opiates          300     Cocaine          300     THC              50  GLUCOSE, CAPILLARY     Status: Abnormal   Collection Time    08/26/12 10:04 PM      Result Value Range   Glucose-Capillary 101 (*) 70 - 99 mg/dL  GLUCOSE, CAPILLARY     Status: Abnormal   Collection Time    08/27/12  8:17 AM      Result Value Range   Glucose-Capillary 101 (*) 70 - 99 mg/dL    No results found.  Review of Systems  Psychiatric/Behavioral: Positive for memory loss. Negative for depression, suicidal ideas, hallucinations and substance abuse. The patient is nervous/anxious and has insomnia.    Blood pressure 108/81, pulse 67, temperature 97.6 F (36.4 C), temperature source Oral, resp. rate 16, SpO2 96.00%. Physical Exam  Assessment/Plan: Axis I schizoaffective disorder, disorder due to general medical condition (MR)  Plan Patient does not meet criteria for inpatient psychiatric treatment.  Patient has borderline intellect.  He is compliant with his medication.  He does not exhibit any aggression or violence in the emergency room.  It is strongly recommended to discuss his psychiatric medications with his psychiatrist and his living situation but the staff of group home.  Recommend discharge to his group home.  ARFEEN,SYED T. 08/27/2012, 10:16 AM

## 2012-08-27 NOTE — ED Notes (Signed)
Shawn Baird and guardian-called to inquire if he was still here. Gave the number for Mr.[facility]-[phone]. Stated to call him. He did not answer his phone-I left a message asking him to call us back.

## 2012-08-27 NOTE — ED Notes (Signed)
Insulin verified with Janie Rambo RN. 

## 2012-08-27 NOTE — ED Notes (Signed)
Spoke with [facility] again concerning D/C-stated someone would be here between 4-5pm today.

## 2012-08-27 NOTE — BH Assessment (Signed)
[facility] Assessment Progress Note   ACT spoke with Tresa Endo at [facility] where pt has been a client for almost 3 years.  Program is willing to take pt back and will make arrangements to come and get pt if d/c today or Sunday.  However, Tresa Endo stated that pt has been engaging in this behavior at this level of frequency and duration for almost 8 mths.  "Usually when our pt do this it is because of medication issues"  Tresa Endo said.  When asked about changes to med that may have triggered behaviors Tresa Endo could not identify any, but said "I am sure there have been."  Pt sees Dr. Omelia Blackwater for psych services.  Tresa Endo concern is "You guys stabilize him and send him back and then 3 weeks later his back there again and the cycle continues.  You need to help [place] get him on the right meds or get him to a place that specializes in getting him on the right meds.  What is happening now isn't working."  ACT explained pt does not meet criteria for inptx services at this time.  Tresa Endo was polite and professional and verbalized he understood.  "I am just saying I work with the pt and I know him and there is something that is going on after 3 years of knowing him that is not being addressed and I want him to get the help he needs."  ACT reiterated, "If he is cleared to come back today is your program going to come and get him and again, Tresa Endo responded, ' yes."  There was a good exchange of information here and ACT believes Tresa Endo comments need to be taken seriously and addressed in some fashion, but pt does not need psych services at this time for inptx.

## 2012-08-27 NOTE — ED Notes (Signed)
Notified group home of D/C.Marland Kitchenreceptionist stated she would "let Shawn Baird know."

## 2012-08-27 NOTE — ED Notes (Signed)
Spoke to group home director-Martin and reported he was on his way to pick up patient in . Mr. Daphine Deutscher stated he was under the impression that pt was [facility] the pt sister. Advised Mr. Daphine Deutscher that pt is ready for discharge.

## 2012-08-27 NOTE — ED Provider Notes (Signed)
I received a call from the ACT team stating patient was stable for discharge back to his group home. 37M with hx of MR, DD. Patient calm, cooperative, amenable to returning home. I spoke to Dr. Lolly Mustache about patient's ultimate disposition, as per previous Psych reports, patient needs a facility capable of handling his MR needs. Per Dr. Lolly Mustache, patient's Psychiatrist needs to coordinate with these facilties on an outpatient basis. He is stable for discharge.  Dagmar Hait, MD 08/27/12 2106

## 2012-08-27 NOTE — ED Notes (Signed)
telepsych D/C'd due to being completed by psychiatry.

## 2012-08-27 NOTE — ED Notes (Signed)
Called group home to ask when they were coming-left message on answering machine to call here.

## 2012-08-28 NOTE — ED Provider Notes (Signed)
Medical screening examination/treatment/procedure(s) were performed by non-physician practitioner and as supervising physician I was immediately available for consultation/collaboration.    , MD 08/28/12 1549 

## 2012-12-18 ENCOUNTER — Emergency Department (HOSPITAL_COMMUNITY)
Admission: EM | Admit: 2012-12-18 | Discharge: 2012-12-20 | Disposition: A | Discharge status: Home / Self Care | Payer: Medicare Other | Attending: Emergency Medicine | Admitting: Emergency Medicine

## 2012-12-18 DIAGNOSIS — Z794 Long term (current) use of insulin: Secondary | ICD-10-CM | POA: Insufficient documentation

## 2012-12-18 DIAGNOSIS — F259 Schizoaffective disorder, unspecified: Secondary | ICD-10-CM | POA: Insufficient documentation

## 2012-12-18 DIAGNOSIS — E785 Hyperlipidemia, unspecified: Secondary | ICD-10-CM | POA: Insufficient documentation

## 2012-12-18 DIAGNOSIS — F39 Unspecified mood [affective] disorder: Secondary | ICD-10-CM

## 2012-12-18 DIAGNOSIS — F79 Unspecified intellectual disabilities: Secondary | ICD-10-CM | POA: Insufficient documentation

## 2012-12-18 DIAGNOSIS — Z7982 Long term (current) use of aspirin: Secondary | ICD-10-CM | POA: Insufficient documentation

## 2012-12-18 DIAGNOSIS — R4689 Other symptoms and signs involving appearance and behavior: Secondary | ICD-10-CM

## 2012-12-18 DIAGNOSIS — R45851 Suicidal ideations: Secondary | ICD-10-CM

## 2012-12-18 DIAGNOSIS — Z9889 Other specified postprocedural states: Secondary | ICD-10-CM | POA: Insufficient documentation

## 2012-12-18 DIAGNOSIS — F319 Bipolar disorder, unspecified: Secondary | ICD-10-CM | POA: Insufficient documentation

## 2012-12-18 DIAGNOSIS — F911 Conduct disorder, childhood-onset type: Secondary | ICD-10-CM | POA: Insufficient documentation

## 2012-12-18 DIAGNOSIS — H9190 Unspecified hearing loss, unspecified ear: Secondary | ICD-10-CM | POA: Insufficient documentation

## 2012-12-18 DIAGNOSIS — IMO0002 Reserved for concepts with insufficient information to code with codable children: Secondary | ICD-10-CM | POA: Insufficient documentation

## 2012-12-18 DIAGNOSIS — Z79899 Other long term (current) drug therapy: Secondary | ICD-10-CM

## 2012-12-18 DIAGNOSIS — F411 Generalized anxiety disorder: Secondary | ICD-10-CM | POA: Insufficient documentation

## 2012-12-18 DIAGNOSIS — R45850 Homicidal ideations: Secondary | ICD-10-CM | POA: Insufficient documentation

## 2012-12-18 DIAGNOSIS — I10 Essential (primary) hypertension: Secondary | ICD-10-CM | POA: Insufficient documentation

## 2012-12-18 DIAGNOSIS — K5900 Constipation, unspecified: Secondary | ICD-10-CM | POA: Insufficient documentation

## 2012-12-18 DIAGNOSIS — Z859 Personal history of malignant neoplasm, unspecified: Secondary | ICD-10-CM | POA: Insufficient documentation

## 2012-12-18 DIAGNOSIS — Z8719 Personal history of other diseases of the digestive system: Secondary | ICD-10-CM | POA: Insufficient documentation

## 2012-12-18 DIAGNOSIS — E119 Type 2 diabetes mellitus without complications: Secondary | ICD-10-CM | POA: Insufficient documentation

## 2012-12-19 ENCOUNTER — Encounter (HOSPITAL_COMMUNITY): Payer: Self-pay | Admitting: Emergency Medicine

## 2012-12-19 LAB — COMPREHENSIVE METABOLIC PANEL
ALT: 15 U/L (ref 0–53)
AST: 25 U/L (ref 0–37)
Albumin: 4 g/dL (ref 3.5–5.2)
CO2: 25 mEq/L (ref 19–32)
Calcium: 9.6 mg/dL (ref 8.4–10.5)
Creatinine, Ser: 1.85 mg/dL — ABNORMAL HIGH (ref 0.50–1.35)
GFR calc non Af Amer: 40 mL/min — ABNORMAL LOW (ref >90)
Sodium: 138 mEq/L (ref 135–145)
Total Protein: 7.8 g/dL (ref 6.0–8.3)

## 2012-12-19 LAB — VALPROIC ACID LEVEL: Valproic Acid Lvl: 81.1 ug/mL (ref 50.0–100.0)

## 2012-12-19 LAB — RAPID URINE DRUG SCREEN, HOSP PERFORMED
Benzodiazepines: NOT DETECTED
Cocaine: NOT DETECTED
Opiates: NOT DETECTED
Tetrahydrocannabinol: NOT DETECTED

## 2012-12-19 LAB — CBC
MCH: 31.3 pg (ref 26.0–34.0)
MCHC: 34.7 g/dL (ref 30.0–36.0)
MCV: 90 fL (ref 78.0–100.0)
Platelets: 216 10*3/uL (ref 150–400)
RDW: 13 % (ref 11.5–15.5)

## 2012-12-19 LAB — GLUCOSE, CAPILLARY
Glucose-Capillary: 109 mg/dL — ABNORMAL HIGH (ref 70–99)
Glucose-Capillary: 131 mg/dL — ABNORMAL HIGH (ref 70–99)

## 2012-12-19 MED ORDER — ALUM & MAG HYDROXIDE-SIMETH 200-200-20 MG/5ML PO SUSP: 30.0000 mL | ORAL | Status: DC | PRN

## 2012-12-19 MED ORDER — QUETIAPINE FUMARATE 300 MG PO TABS
600.0000 mg | ORAL_TABLET | Freq: Every day | ORAL | Status: DC
  Administered 2012-12-19: 600 mg via ORAL
  Filled 2012-12-19: qty 2

## 2012-12-19 MED ORDER — GABAPENTIN 300 MG PO CAPS
300.0000 mg | ORAL_CAPSULE | Freq: Every day | ORAL | Status: DC
  Administered 2012-12-19: 300 mg via ORAL
  Filled 2012-12-19 (×2): qty 1

## 2012-12-19 MED ORDER — IBUPROFEN 200 MG PO TABS: 600.0000 mg | ORAL_TABLET | Freq: Three times a day (TID) | ORAL | Status: DC | PRN

## 2012-12-19 MED ORDER — LORAZEPAM 1 MG PO TABS: 1.0000 mg | ORAL_TABLET | Freq: Three times a day (TID) | ORAL | Status: DC | PRN

## 2012-12-19 MED ORDER — LORATADINE 10 MG PO TABS
10.0000 mg | ORAL_TABLET | Freq: Every morning | ORAL | Status: DC
  Administered 2012-12-19 – 2012-12-20 (×2): 10 mg via ORAL
  Filled 2012-12-19 (×2): qty 1

## 2012-12-19 MED ORDER — AMLODIPINE BESYLATE 5 MG PO TABS
5.0000 mg | ORAL_TABLET | Freq: Every morning | ORAL | Status: DC
  Administered 2012-12-19 – 2012-12-20 (×2): 5 mg via ORAL
  Filled 2012-12-19 (×2): qty 1

## 2012-12-19 MED ORDER — ASPIRIN EC 81 MG PO TBEC
81.0000 mg | DELAYED_RELEASE_TABLET | Freq: Every morning | ORAL | Status: DC
  Administered 2012-12-19 – 2012-12-20 (×2): 81 mg via ORAL
  Filled 2012-12-19 (×2): qty 1

## 2012-12-19 MED ORDER — METOCLOPRAMIDE HCL 10 MG PO TABS
10.0000 mg | ORAL_TABLET | Freq: Three times a day (TID) | ORAL | Status: DC
  Administered 2012-12-19 – 2012-12-20 (×4): 10 mg via ORAL
  Filled 2012-12-19 (×4): qty 1

## 2012-12-19 MED ORDER — LUBIPROSTONE 24 MCG PO CAPS
24.0000 ug | ORAL_CAPSULE | Freq: Two times a day (BID) | ORAL | Status: DC
  Administered 2012-12-19 – 2012-12-20 (×3): 24 ug via ORAL
  Filled 2012-12-19 (×4): qty 1

## 2012-12-19 MED ORDER — HYDROXYZINE HCL 25 MG PO TABS: 25.0000 mg | ORAL_TABLET | Freq: Four times a day (QID) | ORAL | Status: DC | PRN

## 2012-12-19 MED ORDER — ACETAMINOPHEN 325 MG PO TABS: 650.0000 mg | ORAL_TABLET | ORAL | Status: DC | PRN

## 2012-12-19 MED ORDER — METFORMIN HCL ER 500 MG PO TB24: 500.0000 mg | ORAL_TABLET | Freq: Every morning | ORAL | Status: DC

## 2012-12-19 MED ORDER — SIMVASTATIN 10 MG PO TABS
10.0000 mg | ORAL_TABLET | Freq: Every day | ORAL | Status: DC
  Administered 2012-12-19: 10 mg via ORAL
  Filled 2012-12-19 (×3): qty 1

## 2012-12-19 MED ORDER — DIVALPROEX SODIUM 500 MG PO DR TAB: 500.0000 mg | DELAYED_RELEASE_TABLET | Freq: Two times a day (BID) | ORAL | Status: DC

## 2012-12-19 MED ORDER — VITAMIN D (ERGOCALCIFEROL) 1.25 MG (50000 UNIT) PO CAPS: 50000.0000 [IU] | ORAL_CAPSULE | ORAL | Status: DC

## 2012-12-19 MED ORDER — BUPROPION HCL ER (XL) 300 MG PO TB24
300.0000 mg | ORAL_TABLET | Freq: Every day | ORAL | Status: DC
  Administered 2012-12-19 – 2012-12-20 (×2): 300 mg via ORAL
  Filled 2012-12-19 (×2): qty 1

## 2012-12-19 MED ORDER — POLYETHYLENE GLYCOL 3350 17 G PO PACK
17.0000 g | PACK | Freq: Every morning | ORAL | Status: DC
  Administered 2012-12-19 – 2012-12-20 (×2): 17 g via ORAL
  Filled 2012-12-19 (×2): qty 1

## 2012-12-19 MED ORDER — VITAMIN D3 25 MCG (1000 UNIT) PO TABS
1000.0000 [IU] | ORAL_TABLET | Freq: Every morning | ORAL | Status: DC
  Administered 2012-12-19 – 2012-12-20 (×2): 1000 [IU] via ORAL
  Filled 2012-12-19 (×2): qty 1

## 2012-12-19 MED ORDER — INSULIN ASPART 100 UNIT/ML ~~LOC~~ SOLN
2.0000 [IU] | Freq: Three times a day (TID) | SUBCUTANEOUS | Status: DC
  Administered 2012-12-20: 2 [IU] via SUBCUTANEOUS
  Filled 2012-12-19: qty 1

## 2012-12-19 MED ORDER — QUETIAPINE FUMARATE 100 MG PO TABS
100.0000 mg | ORAL_TABLET | Freq: Two times a day (BID) | ORAL | Status: DC
  Administered 2012-12-19 – 2012-12-20 (×3): 100 mg via ORAL
  Filled 2012-12-19 (×3): qty 1

## 2012-12-19 MED ORDER — DICYCLOMINE HCL 20 MG PO TABS
20.0000 mg | ORAL_TABLET | Freq: Four times a day (QID) | ORAL | Status: DC
  Administered 2012-12-19 – 2012-12-20 (×6): 20 mg via ORAL
  Filled 2012-12-19 (×6): qty 1

## 2012-12-19 MED ORDER — METOPROLOL TARTRATE 25 MG PO TABS
12.5000 mg | ORAL_TABLET | Freq: Two times a day (BID) | ORAL | Status: DC
  Administered 2012-12-19: 12.5 mg via ORAL
  Administered 2012-12-20: 25 mg via ORAL
  Filled 2012-12-19 (×2): qty 1

## 2012-12-19 MED ORDER — NICOTINE 21 MG/24HR TD PT24: 21.0000 mg | MEDICATED_PATCH | Freq: Every day | TRANSDERMAL | Status: DC

## 2012-12-19 MED ORDER — PANTOPRAZOLE SODIUM 40 MG PO TBEC
40.0000 mg | DELAYED_RELEASE_TABLET | Freq: Every day | ORAL | Status: DC
  Administered 2012-12-19 – 2012-12-20 (×2): 40 mg via ORAL
  Filled 2012-12-19 (×2): qty 1

## 2012-12-19 MED ORDER — VITAMIN B-12 1000 MCG PO TABS
1000.0000 ug | ORAL_TABLET | Freq: Every morning | ORAL | Status: DC
  Administered 2012-12-19 – 2012-12-20 (×2): 1000 ug via ORAL
  Filled 2012-12-19 (×2): qty 1

## 2012-12-19 MED ORDER — ONDANSETRON HCL 4 MG PO TABS: 4.0000 mg | ORAL_TABLET | Freq: Four times a day (QID) | ORAL | Status: DC | PRN

## 2012-12-19 MED ORDER — ONDANSETRON HCL 4 MG PO TABS: 4.0000 mg | ORAL_TABLET | Freq: Three times a day (TID) | ORAL | Status: DC | PRN

## 2012-12-19 MED ORDER — ZOLPIDEM TARTRATE 5 MG PO TABS
5.0000 mg | ORAL_TABLET | Freq: Every evening | ORAL | Status: DC | PRN
  Administered 2012-12-19: 5 mg via ORAL
  Filled 2012-12-19: qty 1

## 2012-12-19 MED ORDER — GABAPENTIN 300 MG PO CAPS
300.0000 mg | ORAL_CAPSULE | Freq: Every day | ORAL | Status: DC
  Administered 2012-12-19 – 2012-12-20 (×2): 300 mg via ORAL
  Filled 2012-12-19 (×2): qty 1

## 2012-12-19 MED ORDER — DIVALPROEX SODIUM ER 500 MG PO TB24
500.0000 mg | ORAL_TABLET | Freq: Two times a day (BID) | ORAL | Status: DC
  Administered 2012-12-19 – 2012-12-20 (×3): 500 mg via ORAL
  Filled 2012-12-19 (×4): qty 1

## 2012-12-19 NOTE — BH Assessment (Addendum)
Tele Assessment Note   Shawn Baird is an 52 y.o. male, single, African-American who was brought to [facility] by law enforcement after Pt was petitioned for IVC by group home staff due to threats to kill staff. Pt has a history of schizoaffective disorder and mental retardation. IVC papers indicate that patient has become a threat to himself and others, threatening to kill himself as well as other members of the group home. Patient has been accusing staff members of stealing his property and refused to take his medications today. Patient states he could kill himself. When ED staff asked if patient wants to kill himself patient forms his hand into the shape of a gun and put it up to his head. Police officer who brought patient to ED told ED staff that patient declared that he wanted to shoot his brains out and kill her.  Pt is a poor historian and unable to answer most questions appropriately. He has a hearing loss and it was unclear whether he didn't hear or didn't understand the questions. He responded "sometimes I do and sometimes I don't" to many questions including "Can you tell me where you are right now?" Pt did say he did not want to kill himself but was unable to answer if he wanted to hurt other people. Pt often said "My momma, she always gets on my nerves" but Pt's mother is deceased. When asked if he was taking his medication he replied "sometimes I do and sometimes I don't."   Pt is casually dressed and fairly groomed. His speech is slow and mumbling. His thought process is slow and appears irrelevant at times. His mood and affect appeared mild irritated. Pt was calm during assessment.   Axis I: Schizoaffective Disorder Axis II: Mental retardation, severity unknown Axis III:  Past Medical History  Diagnosis Date  . Diabetes mellitus   . Anxiety   . Hypertension   . Hyperlipemia   . Mental disorder   . Schizoaffective disorder   . Impulsive control disorder  . Pancreatitis   . Otitis  media   . Cancer   . HOH (hard of hearing)   . Tourette's disease   . Mental retardation   . Chronic constipation    Axis IV: other psychosocial or environmental problems and problems with primary support group Axis V: GAF=25  Past Medical History:  Past Medical History  Diagnosis Date  . Diabetes mellitus   . Anxiety   . Hypertension   . Hyperlipemia   . Mental disorder   . Schizoaffective disorder   . Impulsive control disorder  . Pancreatitis   . Otitis media   . Cancer   . HOH (hard of hearing)   . Tourette's disease   . Mental retardation   . Chronic constipation     Past Surgical History  Procedure Laterality Date  . 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 High blood pressure Sister   . High Cholesterol Sister   . Heart disease Mother   . Stroke Mother     Social History:  reports that he has never smoked. He has never used smokeless tobacco. He reports that he does not drink alcohol or use illicit drugs.  Additional Social History:  Alcohol / Drug Use Pain Medications: NA Prescriptions: NA Over the Counter: NA History of alcohol / drug use?: No history of  alcohol / drug abuse Longest period of sobriety (when/how long): NA  CIWA: CIWA-Ar BP: 131/97 mmHg Pulse Rate: 103 COWS:    Allergies: No Known Allergies  Home Medications:  (Not in a hospital admission)  OB/GYN Status:  No LMP for male patient.  General Assessment Data Location of Assessment: [facility] ED Is this a Tele or Face-to-Face Assessment?: Tele Assessment Is this an Initial Assessment or a Re-assessment for this encounter?: Initial Assessment Living Arrangements: Other (Comment) [facility]) Can pt return to current living arrangement?: Yes Admission Status: Involuntary Is patient capable of signing voluntary admission?: No Transfer from: Group Home Referral Source: Other (Group home  staff)     [facility] Crisis Care Plan Living Arrangements: Other (Comment) [phone] [facility]) Name of Psychiatrist: Dolores Frame, MD Name of Therapist: None  Education Status Is patient currently in school?: No Current Grade: NA Highest grade of school patient has completed: NA Name of school: NA Contact person: NA  Risk to self Suicidal Ideation: Yes-Currently Present Suicidal Intent: No Is patient at risk for suicide?: Yes Suicidal Plan?: Yes-Currently Present Specify Current Suicidal Plan: Blow his brains out Access to Means: No What has been your use of drugs/alcohol within the last 12 months?: None Previous Attempts/Gestures: No How many times?: 0 Other Self Harm Risks: None Triggers for Past Attempts: None known Intentional Self Injurious Behavior: None Family Suicide History: Unknown Recent stressful life event(s): Other (Comment) (None identified) Persecutory voices/beliefs?: No Depression: No Depression Symptoms: Feeling angry/irritable Substance abuse history and/or treatment for substance abuse?: No Suicide prevention information given to non-admitted patients: Not applicable  Risk to Others Homicidal Ideation: Yes-Currently Present Thoughts of Harm to Others: Yes-Currently Present Comment - Thoughts of Harm to Others: Pt made verbal threats to harm group home staff and police Current Homicidal Intent: No Current Homicidal Plan: No Access to Homicidal Means: No Identified Victim: Group home staff and police History of harm to others?: No Assessment of Violence: In past 6-12 months Violent Behavior Description: Pt has a history of banging doors, destroying propert Does patient have access to weapons?: No Criminal Charges Pending?: No Does patient have a court date: No  Psychosis Hallucinations: None noted Delusions: None noted  Mental Status Report Appear/Hygiene: Other (Comment) (Casually dressed) Eye Contact: Good Motor Activity:  Unremarkable;Freedom of movement Speech: Slow;Other (Comment) (Mumbled) Level of Consciousness: Alert Mood: Irritable Affect: Apprehensive Anxiety Level: Minimal Thought Processes: Irrelevant Judgement: Impaired Orientation: Unable to assess Obsessive Compulsive Thoughts/Behaviors: None  Cognitive Functioning Concentration: Decreased Memory: Recent Impaired;Remote Impaired IQ: Below Average Level of Function: MR: IQ unknown Insight: Poor Impulse Control: Poor Appetite: Good Weight Loss: 0 Weight Gain: 0 Sleep: No Change (Unable to assess) Total Hours of Sleep: 0 (unable to assess) Vegetative Symptoms: None  ADLScreening [facility] Assessment Services) Patient's cognitive ability adequate to safely complete daily activities?: Yes Patient able to express need for assistance with ADLs?: Yes Independently performs ADLs?: Yes (appropriate for developmental age)  Prior Inpatient Therapy Prior Inpatient Therapy: Yes Prior Therapy Dates: unknown Prior Therapy Facilty/Provider(s): [facility] Reason for Treatment: Schizophrenia  Prior Outpatient Therapy Prior Outpatient Therapy: Yes Prior Therapy Dates: Current Prior Therapy Facilty/Provider(s): Dolores Frame, MD Reason for Treatment: Schizophrenia  ADL Screening (condition at time of admission) Patient's cognitive ability adequate to safely complete daily activities?: Yes Is the patient deaf or have difficulty hearing?: Yes Does the patient have difficulty seeing, even when wearing glasses/contacts?: No Does the patient have difficulty concentrating, remembering, or making decisions?: Yes  Patient able to express need for assistance with ADLs?: Yes Does the patient have difficulty dressing or bathing?: No Independently performs ADLs?: Yes (appropriate for developmental age) Does the patient have difficulty walking or climbing stairs?: No Weakness of Legs: None Weakness of Arms/Hands: None  Home Assistive  Devices/Equipment Home Assistive Devices/Equipment: Other (Comment) (Hearing aid)    Abuse/Neglect Assessment (Assessment to be complete while patient is alone) Physical Abuse: Denies Verbal Abuse: Denies Sexual Abuse: Denies Exploitation of patient/patient's resources: Denies Self-Neglect: Denies Values / Beliefs Cultural Requests During Hospitalization: None Spiritual Requests During Hospitalization: None     Nutrition Screen- [facility] Adult/[facility] Patient's home diet: Regular  Additional Information 1:1 In Past 12 Months?: Yes CIRT Risk: Yes Elopement Risk: Yes Does patient have medical clearance?: Yes     Disposition:  Disposition Initial Assessment Completed for this Encounter: Yes Disposition of Patient: Other dispositions Other disposition(s): Other (Comment)  Called [facility] [phone] and spoke with Larkin Ina [phone] for collateral information. Ms. Cliffton Asters said she was not working when tonight's incident happened and she will try to contact the staff who was working with the Pt but they may not contact us until morning. Spoke with Laverle Hobby, [facility] who is familiar with Pt. She spoke with Pt via tele-assessment and said he sometimes behaves this way when not on his medication. Contacted Antony Madura, PA-C and explained that TTS staff is waiting for collateral information from group home and recommended that Depakote level be check to see if Pt is taking medication. Discussed case with Alberteen Sam, NP who agreed that Depakote level be drawn, collateral information from group home be obtained and psychiatry evaluate Pt later this morning.  Pamalee Leyden, [facility], [facility] Triage Specialist   Patsy Baltimore, Harlin Rain 12/19/2012 2:57 AM

## 2012-12-19 NOTE — Progress Notes (Signed)
PHARMACIST - PHYSICIAN COMMUNICATION DR:  ER CONCERNING:  METFORMIN SAFE ADMINISTRATION POLICY  RECOMMENDATION: Metformin has been placed on DISCONTINUE (rejected order) STATUS and should be reordered only after any of the conditions below are ruled out.  Current safety recommendations include avoiding metformin for a minimum of 48 hours after the patient's exposure to intravenous contrast media.  DESCRIPTION:  The Pharmacy Committee has adopted a policy that restricts the use of metformin in hospitalized patients until all the contraindications to administration have been ruled out. Specific contraindications are: [x]  Serum creatinine ? 1.5 for males []  Serum creatinine ? 1.4 for females []  Shock, acute MI, sepsis, hypoxemia, dehydration []  Planned administration of intravenous iodinated contrast media []  Heart Failure patients with low EF []  Acute or chronic metabolic acidosis (including DKA)     , Loma Messing PharmD Pager #: [phone] 10:49 AM 12/19/2012

## 2012-12-19 NOTE — ED Notes (Addendum)
Pt brought in by [employer] from group home. Threatened to harm group home staff and himself. Pt refused medications. Is alert by disoriented.

## 2012-12-19 NOTE — ED Notes (Signed)
Patient appears calm, NAD, when asked a question would not verbalize an answer just continued to make his bed, did not make eye contact.

## 2012-12-19 NOTE — ED Provider Notes (Signed)
CSN: [id number]     Arrival date & time 12/18/12  2349 History   First MD Initiated Contact with Patient 12/18/12 2351     Chief Complaint  Patient presents with  . Medical Clearance   (Consider location/radiation/quality/duration/timing/severity/associated sxs/prior Treatment) HPI Comments: Patient is a 52 year old male with a history of bipolar schizophrenia as well as mental retardation who presents from a group home under IVC. IVC papers indicate that patient has become a threat to himself and others, threatening to kill himself as well as other members of the group home. Patient has been accusing staff members of stealing his property and refused to take his medications today. Patient states needed he could kill himself. When asked if patient wants to kill himself patient forms his hand into the shape of a gun and puts it up to his head. Police officer who brought patient to ED states that patient declared that he wanted to shoot his brains out and kill her. ETOH use and illicit drug use status unknown.  The history is provided by the patient. No language interpreter was used.    Past Medical History  Diagnosis Date  . Diabetes mellitus   . Anxiety   . Hypertension   . Hyperlipemia   . Mental disorder   . Schizoaffective disorder   . Impulsive control disorder  . Pancreatitis   . Otitis media   . Cancer   . HOH (hard of hearing)   . Tourette's disease   . Mental retardation   . Chronic constipation    Past Surgical History  Procedure Laterality Date  . 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 High blood pressure Sister   . High Cholesterol Sister   . Heart disease Mother   . Stroke Mother    History  Substance Use Topics  . Smoking status: Never Smoker   . Smokeless tobacco: Never Used  . Alcohol Use: No    Review of Systems  Psychiatric/Behavioral: Positive for suicidal ideas and agitation.        +homicidal ideations  All other systems reviewed and are negative.    Allergies  Review of patient's allergies indicates no known allergies.  Home Medications   Current Outpatient Rx  Name  Route  Sig  Dispense  Refill  . amLODipine (NORVASC) 5 MG tablet   Oral   Take 5 mg by mouth every morning.          Marland Kitchen aspirin EC 81 MG tablet   Oral   Take 81 mg by mouth every morning.          Marland Kitchen buPROPion (WELLBUTRIN XL) 150 MG 24 hr tablet   Oral   Take 300 mg by mouth every morning.          . cholecalciferol (VITAMIN D) 1000 UNITS tablet   Oral   Take 1,000 Units by mouth every morning.          . dicyclomine (BENTYL) 20 MG tablet   Oral   Take 20 mg by mouth 2 (two) times daily.          . divalproex (DEPAKOTE ER) 500 MG 24 hr tablet   Oral   Take 500 mg by mouth 2 (two) times daily.         Marland Kitchen gabapentin (NEURONTIN) 300 MG capsule   Oral   Take 300 mg by mouth  at bedtime.         . insulin regular (HUMULIN R) 100 units/mL injection   Subcutaneous   Inject 2-12 Units into the skin 3 (three) times daily as needed for high blood sugar. Sliding scale         . loratadine (CLARITIN) 10 MG tablet   Oral   Take 10 mg by mouth every morning.          . metoCLOPramide (REGLAN) 10 MG tablet   Oral   Take 10 mg by mouth 4 (four) times daily -  before meals and at bedtime.          . metoprolol tartrate (LOPRESSOR) 25 MG tablet   Oral   Take 12.5 mg by mouth 2 (two) times daily.         Marland Kitchen omeprazole (PRILOSEC) 20 MG capsule   Oral   Take 20 mg by mouth every morning.          . ondansetron (ZOFRAN) 4 MG tablet   Oral   Take 4 mg by mouth every 6 (six) hours as needed for nausea.         . polyethylene glycol (MIRALAX / GLYCOLAX) packet   Oral   Take 17 g by mouth every morning.          . pravastatin (PRAVACHOL) 20 MG tablet   Oral   Take 20 mg by mouth at bedtime.          Marland Kitchen QUEtiapine (SEROQUEL) 100 MG tablet   Oral   Take 100 mg  by mouth 2 (two) times daily.         Marland Kitchen testosterone cypionate (DEPOTESTOTERONE CYPIONATE) 100 MG/ML injection   Intramuscular   Inject 100 mg into the muscle every 28 (twenty-eight) days. For IM use only         . vitamin B-12 (CYANOCOBALAMIN) 1000 MCG tablet   Oral   Take 1,000 mcg by mouth every morning.           BP 131/97  Pulse 103  Temp(Src) 98.2 F (36.8 C) (Oral)  Resp 16  SpO2 96%  Physical Exam  Nursing note and vitals reviewed. Constitutional: He is oriented to person, place, and time. He appears well-developed and well-nourished. No distress.  Patient mumbles answers to questions asked. Alert and oriented.  HENT:  Head: Normocephalic and atraumatic.  Eyes: Conjunctivae and EOM are normal. No scleral icterus.  Neck: Normal range of motion.  Cardiovascular: Normal rate, regular rhythm and normal heart sounds.   Pulmonary/Chest: Effort normal and breath sounds normal. No respiratory distress. He has no wheezes. He has no rales.  Abdominal: Soft. He exhibits no distension. There is no tenderness.  Musculoskeletal: Normal range of motion.  Neurological: He is alert and oriented to person, place, and time.  Moves extremities without ataxia.  Skin: Skin is warm and dry. No rash noted. He is not diaphoretic. No erythema. No pallor.  Psychiatric: He is agitated. He expresses inappropriate judgment. He expresses homicidal and suicidal ideation. He expresses no suicidal plans and no homicidal plans.  Speech mumbled; patient appears slightly agitated.    ED Course  Procedures (including critical care time) Labs Review Labs Reviewed  COMPREHENSIVE METABOLIC PANEL - Abnormal; Notable for the following:    Creatinine, Ser 1.85 (*)    GFR calc non Af Amer 40 (*)    GFR calc Af Amer 46 (*)    All other components within normal limits  SALICYLATE LEVEL -  Abnormal; Notable for the following:    Salicylate Lvl <2.0 (*)    All other components within normal limits   ACETAMINOPHEN LEVEL  CBC  ETHANOL  URINE RAPID DRUG SCREEN (HOSP PERFORMED)  VALPROIC ACID LEVEL   Imaging Review No results found.  EKG Interpretation   None       MDM   1. Suicidal ideations   2. Aggressive behavior    12:40 - Patient presents from group home under IVC for concern of him being a threat to himself and others. Patient stating that he wants to kill himself and others. Labs unremarkable. Temp cycled orders placed. Physical exam unremarkable and patient medically cleared. Patient evaluated by a Ala Dach from behavioral health who will have patient evaluated by psychiatry in the morning. Depakote level recommended which has been ordered. Per [place] [employer], group home endorsed patient taking all of his medications until this morning.  5:20 - Psychiatric evaluation pending.    Antony Madura, PA-C 12/19/12 [phone]

## 2012-12-19 NOTE — Consult Note (Signed)
[facility] Face-to-Face Psychiatry Consult   Reason for Consult:  IVC'ed for threats to kill staff Referring Physician:  EDP  Shawn Baird is an 52 y.o. male.  Assessment: AXIS I:  Schizoaffective Disorder AXIS II:  Mental retardation, severity unknown AXIS III:   Past Medical History  Diagnosis Date  . Diabetes mellitus   . Anxiety   . Hypertension   . Hyperlipemia   . Mental disorder   . Schizoaffective disorder   . Impulsive control disorder  . Pancreatitis   . Otitis media   . Cancer   . HOH (hard of hearing)   . Tourette's disease   . Mental retardation   . Chronic constipation    AXIS IV:  other psychosocial or environmental problems AXIS V:  21-30 behavior considerably influenced by delusions or hallucinations OR serious impairment in judgment, communication OR inability to function in almost all areas  Plan:  Pt's home meds will be restarted, and will observe for mood stabilization in ED. Per ED staff, pt has a history of stabilizing with meds in the ED, and then is typically d/c'ed to group home.  Subjective:   Shawn Baird is a 52 y.o. male patient admitted with IVC for threats to kill group home staff. Chart was reviewed. Pt was interviewed with NP Julieanne Cotton, and discussed with LCSW Kristen. Pt was a poor historian, and provided limited information. Pt reports that he does not have a hearing aid. He repeated "I stay at home". He stated that his "mom got on my nerves". He denies current SI/HI.  Per tele-assessment note from earlier this AM: Shawn Baird is an 52 y.o. male, single, African-American who was brought to [facility] by law enforcement after Pt was petitioned for IVC by group home staff due to threats to kill staff. Pt has a history of schizoaffective disorder and mental retardation. IVC papers indicate that patient has become a threat to himself and others, threatening to kill himself as well as other members of the group home. Patient has been accusing staff members  of stealing his property and refused to take his medications today. Patient states he could kill himself. When ED staff asked if patient wants to kill himself patient forms his hand into the shape of a gun and put it up to his head. Police officer who brought patient to ED told ED staff that patient declared that he wanted to shoot his brains out and kill her.  Pt is a poor historian and unable to answer most questions appropriately. He has a hearing loss and it was unclear whether he didn't hear or didn't understand the questions. He responded "sometimes I do and sometimes I don't" to many questions including "Can you tell me where you are right now?" Pt did say he did not want to kill himself but was unable to answer if he wanted to hurt other people. Pt often said "My momma, she always gets on my nerves" but Pt's mother is deceased. When asked if he was taking his medication he replied "sometimes I do and sometimes I don't."    Past Psychiatric History: Past Medical History  Diagnosis Date  . Diabetes mellitus   . Anxiety   . Hypertension   . Hyperlipemia   . Mental disorder   . Schizoaffective disorder   . Impulsive control disorder  . Pancreatitis   . Otitis media   . Cancer   . HOH (hard of hearing)   . Tourette's disease   . Mental retardation   .  Chronic constipation     reports that he has never smoked. He has never used smokeless tobacco. He reports that he does not drink alcohol or use illicit drugs. Family History  Problem Relation Age of Onset  . High blood pressure Sister   . High Cholesterol Sister   . Heart disease Mother   . Stroke Mother    Family History Substance Abuse: No Family Supports: Yes, List: (Sister) Living Arrangements: Other (Comment) Engineer, building services [facility]) Can pt return to current living arrangement?: Yes Abuse/Neglect [facility]) Physical Abuse: Denies Verbal Abuse: Denies Sexual Abuse: Denies Allergies:  No Known Allergies  ACT Assessment  Complete:  Yes:    Educational Status    Risk to Self: Risk to self Suicidal Ideation: Yes-Currently Present Suicidal Intent: No Is patient at risk for suicide?: Yes Suicidal Plan?: Yes-Currently Present Specify Current Suicidal Plan: Blow his brains out Access to Means: No What has been your use of drugs/alcohol within the last 12 months?: None Previous Attempts/Gestures: No How many times?: 0 Other Self Harm Risks: None Triggers for Past Attempts: None known Intentional Self Injurious Behavior: None Family Suicide History: Unknown Recent stressful life event(s): Other (Comment) (None identified) Persecutory voices/beliefs?: No Depression: No Depression Symptoms: Feeling angry/irritable Substance abuse history and/or treatment for substance abuse?: No Suicide prevention information given to non-admitted patients: Not applicable  Risk to Others: Risk to Others Homicidal Ideation: Yes-Currently Present Thoughts of Harm to Others: Yes-Currently Present Comment - Thoughts of Harm to Others: Pt made verbal threats to harm group home staff and police Current Homicidal Intent: No Current Homicidal Plan: No Access to Homicidal Means: No Identified Victim: Group home staff and police History of harm to others?: No Assessment of Violence: In past 6-12 months Violent Behavior Description: Pt has a history of banging doors, destroying propert Does patient have access to weapons?: No Criminal Charges Pending?: No Does patient have a court date: No  Abuse: Abuse/Neglect Assessment (Assessment to be complete while patient is alone) Physical Abuse: Denies Verbal Abuse: Denies Sexual Abuse: Denies Exploitation of patient/patient's resources: Denies Self-Neglect: Denies  Prior Inpatient Therapy: Prior Inpatient Therapy Prior Inpatient Therapy: Yes Prior Therapy Dates: unknown Prior Therapy Facilty/Provider(s): [facility] Reason for Treatment: Schizophrenia  Prior Outpatient Therapy:  Prior Outpatient Therapy Prior Outpatient Therapy: Yes Prior Therapy Dates: Current Prior Therapy Facilty/Provider(s): Dolores Frame, MD Reason for Treatment: Schizophrenia  Additional Information: Additional Information 1:1 In Past 12 Months?: Yes CIRT Risk: Yes Elopement Risk: Yes Does patient have medical clearance?: Yes                  Objective: Blood pressure 116/77, pulse 85, temperature 97.6 F (36.4 C), temperature source Oral, resp. rate 18, SpO2 96.00%.There is no weight on file to calculate BMI. Results for orders placed during the hospital encounter of 12/18/12 (from the past 72 hour(s))  ACETAMINOPHEN LEVEL     Status: None   Collection Time    12/19/12  1:23 AM      Result Value Range   Acetaminophen (Tylenol), Serum <15.0  10 - 30 ug/mL   Comment:            THERAPEUTIC CONCENTRATIONS VARY     SIGNIFICANTLY. A RANGE OF 10-30     ug/mL MAY BE AN EFFECTIVE     CONCENTRATION FOR MANY PATIENTS.     HOWEVER, SOME ARE BEST TREATED     AT CONCENTRATIONS OUTSIDE THIS     RANGE.     ACETAMINOPHEN  CONCENTRATIONS     >150 ug/mL AT 4 HOURS AFTER     INGESTION AND >50 ug/mL AT 12     HOURS AFTER INGESTION ARE     OFTEN ASSOCIATED WITH TOXIC     REACTIONS.  CBC     Status: None   Collection Time    12/19/12  1:23 AM      Result Value Range   WBC 6.2  4.0 - 10.5 K/uL   RBC 4.99  4.22 - 5.81 MIL/uL   Hemoglobin 15.6  13.0 - 17.0 g/dL   HCT 16.1  09.6 - 04.5 %   MCV 90.0  78.0 - 100.0 fL   MCH 31.3  26.0 - 34.0 pg   MCHC 34.7  30.0 - 36.0 g/dL   RDW 40.9  81.1 - 91.4 %   Platelets 216  150 - 400 K/uL  COMPREHENSIVE METABOLIC PANEL     Status: Abnormal   Collection Time    12/19/12  1:23 AM      Result Value Range   Sodium 138  135 - 145 mEq/L   Potassium 3.7  3.5 - 5.1 mEq/L   Chloride 101  96 - 112 mEq/L   CO2 25  19 - 32 mEq/L   Glucose, Bld 94  70 - 99 mg/dL   BUN 19  6 - 23 mg/dL   Creatinine, Ser 7.82 (*) 0.50 - 1.35 mg/dL   Calcium 9.6   8.4 - 95.6 mg/dL   Total Protein 7.8  6.0 - 8.3 g/dL   Albumin 4.0  3.5 - 5.2 g/dL   AST 25  0 - 37 U/L   ALT 15  0 - 53 U/L   Alkaline Phosphatase 73  39 - 117 U/L   Total Bilirubin 0.3  0.3 - 1.2 mg/dL   GFR calc non Af Amer 40 (*) >90 mL/min   GFR calc Af Amer 46 (*) >90 mL/min   Comment: (NOTE)     The eGFR has been calculated using the CKD EPI equation.     This calculation has not been validated in all clinical situations.     eGFR's persistently <90 mL/min signify possible Chronic Kidney     Disease.  SALICYLATE LEVEL     Status: Abnormal   Collection Time    12/19/12  1:23 AM      Result Value Range   Salicylate Lvl <2.0 (*) 2.8 - 20.0 mg/dL  ETHANOL     Status: None   Collection Time    12/19/12  1:23 AM      Result Value Range   Alcohol, Ethyl (B) <11  0 - 11 mg/dL   Comment:            LOWEST DETECTABLE LIMIT FOR     SERUM ALCOHOL IS 11 mg/dL     FOR MEDICAL PURPOSES ONLY  VALPROIC ACID LEVEL     Status: None   Collection Time    12/19/12  1:23 AM      Result Value Range   Valproic Acid Lvl 81.1  50.0 - 100.0 ug/mL   Comment: Performed at [facility]  URINE RAPID DRUG SCREEN (HOSP PERFORMED)     Status: None   Collection Time    12/19/12  2:44 AM      Result Value Range   Opiates NONE DETECTED  NONE DETECTED   Cocaine NONE DETECTED  NONE DETECTED   Benzodiazepines NONE DETECTED  NONE DETECTED   Amphetamines NONE  DETECTED  NONE DETECTED   Tetrahydrocannabinol NONE DETECTED  NONE DETECTED   Barbiturates NONE DETECTED  NONE DETECTED   Comment:            DRUG SCREEN FOR MEDICAL PURPOSES     ONLY.  IF CONFIRMATION IS NEEDED     FOR ANY PURPOSE, NOTIFY LAB     WITHIN 5 DAYS.                LOWEST DETECTABLE LIMITS     FOR URINE DRUG SCREEN     Drug Class       Cutoff (ng/mL)     Amphetamine      1000     Barbiturate      200     Benzodiazepine   200     Tricyclics       300     Opiates          300     Cocaine          300     THC               50  GLUCOSE, CAPILLARY     Status: Abnormal   Collection Time    12/19/12 11:04 AM      Result Value Range   Glucose-Capillary 110 (*) 70 - 99 mg/dL   Labs are reviewed and are pertinent for depakote level of 81.1.  Current Facility-Administered Medications  Medication Dose Route Frequency Provider Last Rate Last Dose  . acetaminophen (TYLENOL) tablet 650 mg  650 mg Oral Q4H PRN Antony Madura, PA-C      . alum & mag hydroxide-simeth (MAALOX/MYLANTA) 200-200-20 MG/5ML suspension 30 mL  30 mL Oral PRN Antony Madura, PA-C      . amLODipine (NORVASC) tablet 5 mg  5 mg Oral q morning - 10a Laray Anger, DO   5 mg at 12/19/12 1119  . aspirin EC tablet 81 mg  81 mg Oral q morning - 10a Laray Anger, DO   81 mg at 12/19/12 1119  . buPROPion (WELLBUTRIN XL) 24 hr tablet 300 mg  300 mg Oral Daily Earney Navy, NP   300 mg at 12/19/12 1119  . cholecalciferol (VITAMIN D) tablet 1,000 Units  1,000 Units Oral q morning - 10a Laray Anger, DO   1,000 Units at 12/19/12 1120  . dicyclomine (BENTYL) tablet 20 mg  20 mg Oral QID Laray Anger, DO   20 mg at 12/19/12 1118  . divalproex (DEPAKOTE ER) 24 hr tablet 500 mg  500 mg Oral BID Laray Anger, DO   500 mg at 12/19/12 1119  . gabapentin (NEURONTIN) capsule 300 mg  300 mg Oral QHS Laray Anger, DO      . gabapentin (NEURONTIN) capsule 300 mg  300 mg Oral Daily Earney Navy, NP   300 mg at 12/19/12 1118  . hydrOXYzine (ATARAX/VISTARIL) tablet 25 mg  25 mg Oral QID PRN Laray Anger, DO      . ibuprofen (ADVIL,MOTRIN) tablet 600 mg  600 mg Oral Q8H PRN Antony Madura, PA-C      . insulin aspart (novoLOG) injection 2-12 Units  2-12 Units Subcutaneous TID AC & HS Laray Anger, DO      . loratadine (CLARITIN) tablet 10 mg  10 mg Oral q morning - 10a Laray Anger, DO   10 mg at 12/19/12 1120  . LORazepam (ATIVAN) tablet 1 mg  1 mg Oral Q8H PRN Antony Madura, PA-C      . lubiprostone (AMITIZA) capsule 24 mcg   24 mcg Oral BID Laray Anger, DO   24 mcg at 12/19/12 1119  . metoCLOPramide (REGLAN) tablet 10 mg  10 mg Oral TID AC & HS Laray Anger, DO   10 mg at 12/19/12 1119  . metoprolol tartrate (LOPRESSOR) tablet 12.5 mg  12.5 mg Oral BID Laray Anger, DO      . nicotine (NICODERM CQ - dosed in mg/24 hours) patch 21 mg  21 mg Transdermal Daily Antony Madura, PA-C      . ondansetron [facility]) tablet 4 mg  4 mg Oral Q8H PRN Antony Madura, PA-C      . ondansetron [facility]) tablet 4 mg  4 mg Oral Q6H PRN Laray Anger, DO      . pantoprazole (PROTONIX) EC tablet 40 mg  40 mg Oral Daily Laray Anger, DO   40 mg at 12/19/12 1125  . polyethylene glycol (MIRALAX / GLYCOLAX) packet 17 g  17 g Oral q morning - 10a Laray Anger, DO   17 g at 12/19/12 1119  . QUEtiapine (SEROQUEL) tablet 100 mg  100 mg Oral BID Earney Navy, NP   100 mg at 12/19/12 1120  . QUEtiapine (SEROQUEL) tablet 600 mg  600 mg Oral QHS Earney Navy, NP      . simvastatin (ZOCOR) tablet 10 mg  10 mg Oral q1800 Laray Anger, DO      . vitamin B-12 (CYANOCOBALAMIN) tablet 1,000 mcg  1,000 mcg Oral q morning - 10a Laray Anger, DO   1,000 mcg at 12/19/12 1119  . [START ON 12/22/2012] Vitamin D (Ergocalciferol) (DRISDOL) capsule 50,000 Units  50,000 Units Oral 2 times weekly Laray Anger, DO      . zolpidem (AMBIEN) tablet 5 mg  5 mg Oral QHS PRN Antony Madura, PA-C       Current Outpatient Prescriptions  Medication Sig Dispense Refill  . amLODipine (NORVASC) 5 MG tablet Take 5 mg by mouth every morning.       Marland Kitchen aspirin EC 81 MG tablet Take 81 mg by mouth every morning.       Marland Kitchen buPROPion (WELLBUTRIN XL) 300 MG 24 hr tablet Take 300 mg by mouth every morning.      . cholecalciferol (VITAMIN D) 1000 UNITS tablet Take 1,000 Units by mouth every morning.      . clonazePAM (KLONOPIN) 0.5 MG tablet Take 0.5 mg by mouth every 12 (twelve) hours as needed (For agitation.).      Marland Kitchen dicyclomine  (BENTYL) 20 MG tablet Take 20 mg by mouth 4 (four) times daily.       . divalproex (DEPAKOTE ER) 500 MG 24 hr tablet Take 500 mg by mouth 2 (two) times daily.      Marland Kitchen gabapentin (NEURONTIN) 300 MG capsule Take 300 mg by mouth at bedtime.      . hydrOXYzine (ATARAX/VISTARIL) 25 MG tablet Take 25 mg by mouth 4 (four) times daily as needed for anxiety.      . insulin regular (HUMULIN R) 100 units/mL injection Inject 2-12 Units into the skin 4 (four) times daily -  before meals and at bedtime. He uses a sliding scale: 150-200 = 2 units, 201-250 = 4 units, 251-300 = 6 units, 301-350 = 8 units, 351-400 = 10 units or 401-450 = 12 units.      Marland Kitchen  loratadine (CLARITIN) 10 MG tablet Take 10 mg by mouth every morning.       . lubiprostone (AMITIZA) 24 MCG capsule Take 24 mcg by mouth 2 (two) times daily.      . metFORMIN (GLUCOPHAGE-XR) 500 MG 24 hr tablet Take 500 mg by mouth every morning.      . metoCLOPramide (REGLAN) 10 MG tablet Take 10 mg by mouth 4 (four) times daily -  before meals and at bedtime.       . metoprolol tartrate (LOPRESSOR) 25 MG tablet Take 12.5 mg by mouth 2 (two) times daily.      Marland Kitchen omeprazole (PRILOSEC) 20 MG capsule Take 20 mg by mouth every morning.       . ondansetron (ZOFRAN) 4 MG tablet Take 4 mg by mouth every 6 (six) hours as needed for nausea.      . polyethylene glycol (MIRALAX / GLYCOLAX) packet Take 17 g by mouth every morning.       . pravastatin (PRAVACHOL) 20 MG tablet Take 20 mg by mouth at bedtime.       Marland Kitchen QUEtiapine (SEROQUEL) 100 MG tablet Take 100 mg by mouth 2 (two) times daily.      . QUEtiapine (SEROQUEL) 200 MG tablet Take 600 mg by mouth at bedtime.      Marland Kitchen QUEtiapine (SEROQUEL) 50 MG tablet Take 100 mg by mouth every 12 (twelve) hours as needed (For agitation.).      Marland Kitchen testosterone cypionate (DEPOTESTOTERONE CYPIONATE) 100 MG/ML injection Inject 100 mg into the muscle every 28 (twenty-eight) days. For IM use only      . traMADol (ULTRAM) 50 MG tablet Take by  mouth 3 (three) times daily as needed (For abdominal pain.).      Marland Kitchen vitamin B-12 (CYANOCOBALAMIN) 1000 MCG tablet Take 1,000 mcg by mouth every morning.       . Vitamin D, Ergocalciferol, (DRISDOL) 50000 UNITS CAPS capsule Take 50,000 Units by mouth 2 (two) times a week. He takes on Tuesdays and Thursdays.        Psychiatric Specialty Exam:     Blood pressure 116/77, pulse 85, temperature 97.6 F (36.4 C), temperature source Oral, resp. rate 18, SpO2 96.00%.There is no weight on file to calculate BMI.  General Appearance: Fairly Groomed  Patent attorney::  Fair  Speech:  mumbling  Volume:  Decreased  Mood:  Euthymic  Affect:  Appropriate, Congruent and Restricted  Thought Process:  Disorganized and Irrelevant  Orientation:  Other:  pt would not answer  Thought Content:  pt does not appear to be responding to internal stimuli. pt would not clearly answer when asked if he was experiencing hallucinations.  Suicidal Thoughts:  No  Homicidal Thoughts:  No  Memory:  Immediate;   Poor Recent;   Poor Remote;   Poor  Judgement:  Impaired  Insight:  Lacking  Psychomotor Activity:  Normal  Concentration:  Poor  Recall:  Poor  Akathisia:  Negative  Handed:  Right  AIMS (if indicated):     Assets:  Housing  Sleep:      Treatment Plan Summary: Daily contact with patient to assess and evaluate symptoms and progress in treatment Medication management restart home meds, and monitor for mood stabilization in ED.  Ancil Linsey 12/19/2012 11:58 AM

## 2012-12-19 NOTE — BH Assessment (Signed)
Patient currently lives in [facility] [id number]. Larkin Ina (group home staff)  [phone] (home)... # [phone] (cell) called to provide collateral information. Staff has noticed that patient has become increasingly agitated over the past 3 weeks. His sister came to pick him up yesterday and took him shopping.  Says that the sister took him to get new diabetic shoes. Says that his current shoes are "torn up" but he still wants to continue wearing them out in the community and to doctors appointments. Says that when patient returned back to the group home after spending a shopping day out with his sister he was angry and irritable. Sts that patient began trying to scratch out his eyes, kick walls, spitting on staff, and tried to turn over a large tv. He was argumentative with staff and his roommate. Sts that around the holidays he tends to act out and have more behavioral issues. Patient has difficulty accepting his mothers death even though it's been years ago.   His guardian- Avant Printy is his legal guardian. She lives in [place].   Per group home staff patient is able to come back to the group home once he is stable.

## 2012-12-19 NOTE — BH Assessment (Signed)
Received call requesting tele-asssessment. Spoke with Antony Madura, PA-C who said Pt has a history of schizophrenia and was making threats to kill himself, group home staff and police officers. Tele-assessment will be initiated.  Harlin Rain Ria Comment, [facility] Triage Specialist

## 2012-12-19 NOTE — ED Notes (Addendum)
Metallurgist for transport to [facility]. Advised that transporting will arrive at 1930. And will call before pick-up. [phone].  Betsy Pries.   Above information entered in error by nurse-Tanika. Melynda Ripple

## 2012-12-19 NOTE — BH Assessment (Signed)
Called Dara White with [facility] at [phone] for collateral information. Ms. Cliffton Asters states she was not on duty today and does not have information regarding today's incident. She will attempt to contact the staff who was working and have that person call [facility] at [phone].  Harlin Rain Ria Comment, [facility] Triage Specialist

## 2012-12-19 NOTE — ED Notes (Signed)
Pt belongings include 1 pair of jeans,1 jean button down shirt, 1 pair Nike sneakers, pair gray socks, 1 black leather jacket,1 white under shirt,2 bracelet and one watch. Pt wand ed by security.

## 2012-12-19 NOTE — Progress Notes (Signed)
CSW attempted to reach patient Shawn Baird, at [phone] however unable to leave a message. CSW called group home alternate number at [phone] and spoke with Shawn Baird, Shawn Baird, and Shawn Baird. Shawn Baird was night time caregiver for patient at group home. Pt had escalated due to patient old diabetic shoes being thrown away and given a new pair directed by patient guardian Shawn Baird. Per Shawn Baird after this incident, patient began spitting on the floor, the sink, the dishes. Pt then states that if he could get a hold of the kitchen knife he would kill Shawn Baird and her husband. Patient also stated, give me a gun and I'll shoot my brains out. Patient group home called police, where pt was witnessed by police threatening self and staff members. Pt refusing medications that help calm patient.    CSW awaiting for guardianship papers to be faxed by group home.   This patient is well known to Clinical research associate. Pt has history of schizophrenia and MR. Patient has a history of escalating during pt "crisis" or change in daily routine, and is able to stabilize with medications in the emergency department and return to group home. Pt pending psychiatrist evaluation this am.   .Shawn Gosselin, LCSW [phone]  ED CSW .12/19/2012 835am

## 2012-12-20 ENCOUNTER — Encounter (HOSPITAL_COMMUNITY): Payer: Self-pay | Admitting: Registered Nurse

## 2012-12-20 DIAGNOSIS — F79 Unspecified intellectual disabilities: Secondary | ICD-10-CM

## 2012-12-20 DIAGNOSIS — R45850 Homicidal ideations: Secondary | ICD-10-CM

## 2012-12-20 DIAGNOSIS — F259 Schizoaffective disorder, unspecified: Secondary | ICD-10-CM

## 2012-12-20 LAB — BASIC METABOLIC PANEL
BUN: 25 mg/dL — ABNORMAL HIGH (ref 6–23)
CO2: 28 mEq/L (ref 19–32)
Calcium: 9.2 mg/dL (ref 8.4–10.5)
Creatinine, Ser: 1.99 mg/dL — ABNORMAL HIGH (ref 0.50–1.35)
GFR calc Af Amer: 42 mL/min — ABNORMAL LOW (ref >90)
GFR calc non Af Amer: 37 mL/min — ABNORMAL LOW (ref >90)
Glucose, Bld: 106 mg/dL — ABNORMAL HIGH (ref 70–99)

## 2012-12-20 LAB — GLUCOSE, CAPILLARY: Glucose-Capillary: 152 mg/dL — ABNORMAL HIGH (ref 70–99)

## 2012-12-20 NOTE — Progress Notes (Signed)
CSW spoke with Mr. Wardell Heath from group home, at [phone], home # is [phone]. Mr. Wardell Heath expressed concern that patient had stated that he was going to go to the hosptial, hed stay for 3 days, and then come back and stab a staff member and then kill himself. CSW discussed concerns with psychiatrist and psychiatrist np. Per Psychiatrist, Patient is psychitrically stable for discharge home denying any SI/HI/AH/VH. Pt is calm and cooperative and taking medications as directed.  Pt guardian, is working on trying to get patient back to [facility] so patient can have day program that was helpful with pt behaviors. CSW awaiting return call from Mr. Wardell Heath, on when the group home will be coming to pick up patient. Mr. Wardell Heath to call nurses station.    Catha Gosselin, LCSW [phone]  ED CSW .12/20/2012 1424pm

## 2012-12-20 NOTE — ED Provider Notes (Signed)
Medical screening examination/treatment/procedure(s) were performed by non-physician practitioner and as supervising physician I was immediately available for consultation/collaboration.     R , MD 12/20/12 0542 
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
## 2012-12-20 NOTE — ED Notes (Signed)
Pt is A/Ox2; Hx of MR

## 2012-12-20 NOTE — ED Provider Notes (Signed)
3:09 PM Patient was seen and evaluated by the psychiatry team.  Recommendations by the psychiatrist were made to discharge home.  They do not believe the patient is a threat to himself or to others.    Please see psychiatry consultation note for complete details.  I did not personally evaluate this patient  Lyanne Co, MD 12/20/12 1510

## 2012-12-20 NOTE — Consult Note (Signed)
Note reviewed and agreed with  

## 2012-12-24 ENCOUNTER — Emergency Department (HOSPITAL_COMMUNITY)
Admission: EM | Admit: 2012-12-24 | Discharge: 2012-12-25 | Disposition: A | Discharge status: Home / Self Care | Payer: Medicare Other | Attending: Emergency Medicine | Admitting: Emergency Medicine

## 2012-12-24 ENCOUNTER — Encounter (HOSPITAL_COMMUNITY): Payer: Self-pay | Admitting: Emergency Medicine

## 2012-12-24 DIAGNOSIS — Z79899 Other long term (current) drug therapy: Secondary | ICD-10-CM | POA: Insufficient documentation

## 2012-12-24 DIAGNOSIS — I10 Essential (primary) hypertension: Secondary | ICD-10-CM | POA: Insufficient documentation

## 2012-12-24 DIAGNOSIS — Z9119 Patient's noncompliance with other medical treatment and regimen: Secondary | ICD-10-CM | POA: Insufficient documentation

## 2012-12-24 DIAGNOSIS — Z91199 Patient's noncompliance with other medical treatment and regimen due to unspecified reason: Secondary | ICD-10-CM | POA: Insufficient documentation

## 2012-12-24 DIAGNOSIS — Z859 Personal history of malignant neoplasm, unspecified: Secondary | ICD-10-CM | POA: Insufficient documentation

## 2012-12-24 DIAGNOSIS — Z794 Long term (current) use of insulin: Secondary | ICD-10-CM | POA: Insufficient documentation

## 2012-12-24 DIAGNOSIS — R4689 Other symptoms and signs involving appearance and behavior: Secondary | ICD-10-CM

## 2012-12-24 DIAGNOSIS — Z8719 Personal history of other diseases of the digestive system: Secondary | ICD-10-CM | POA: Insufficient documentation

## 2012-12-24 DIAGNOSIS — H9193 Unspecified hearing loss, bilateral: Secondary | ICD-10-CM

## 2012-12-24 DIAGNOSIS — F911 Conduct disorder, childhood-onset type: Secondary | ICD-10-CM | POA: Insufficient documentation

## 2012-12-24 DIAGNOSIS — Z7982 Long term (current) use of aspirin: Secondary | ICD-10-CM | POA: Insufficient documentation

## 2012-12-24 DIAGNOSIS — K5900 Constipation, unspecified: Secondary | ICD-10-CM | POA: Insufficient documentation

## 2012-12-24 DIAGNOSIS — F209 Schizophrenia, unspecified: Secondary | ICD-10-CM | POA: Insufficient documentation

## 2012-12-24 DIAGNOSIS — E119 Type 2 diabetes mellitus without complications: Secondary | ICD-10-CM

## 2012-12-24 DIAGNOSIS — E785 Hyperlipidemia, unspecified: Secondary | ICD-10-CM | POA: Insufficient documentation

## 2012-12-24 DIAGNOSIS — F411 Generalized anxiety disorder: Secondary | ICD-10-CM | POA: Insufficient documentation

## 2012-12-24 DIAGNOSIS — H9190 Unspecified hearing loss, unspecified ear: Secondary | ICD-10-CM | POA: Insufficient documentation

## 2012-12-24 DIAGNOSIS — F79 Unspecified intellectual disabilities: Secondary | ICD-10-CM

## 2012-12-24 LAB — COMPREHENSIVE METABOLIC PANEL
AST: 23 U/L (ref 0–37)
Albumin: 4.3 g/dL (ref 3.5–5.2)
Alkaline Phosphatase: 72 U/L (ref 39–117)
BUN: 23 mg/dL (ref 6–23)
Chloride: 102 mEq/L (ref 96–112)
Creatinine, Ser: 1.84 mg/dL — ABNORMAL HIGH (ref 0.50–1.35)
GFR calc Af Amer: 47 mL/min — ABNORMAL LOW (ref >90)
Potassium: 3.1 mEq/L — ABNORMAL LOW (ref 3.5–5.1)
Total Protein: 8.1 g/dL (ref 6.0–8.3)

## 2012-12-24 LAB — CBC
HCT: 45.5 % (ref 39.0–52.0)
MCH: 31.2 pg (ref 26.0–34.0)
MCHC: 33.8 g/dL (ref 30.0–36.0)
Platelets: 235 10*3/uL (ref 150–400)
RBC: 4.94 MIL/uL (ref 4.22–5.81)
RDW: 13.1 % (ref 11.5–15.5)
WBC: 7.2 10*3/uL (ref 4.0–10.5)

## 2012-12-24 LAB — ETHANOL: Alcohol, Ethyl (B): <11 mg/dL (ref 0–11)

## 2012-12-24 MED ORDER — DICYCLOMINE HCL 20 MG PO TABS
20.0000 mg | ORAL_TABLET | Freq: Four times a day (QID) | ORAL | Status: DC
  Administered 2012-12-24 – 2012-12-25 (×4): 20 mg via ORAL
  Filled 2012-12-24 (×4): qty 1

## 2012-12-24 MED ORDER — HALOPERIDOL LACTATE 5 MG/ML IJ SOLN
5.0000 mg | Freq: Once | INTRAMUSCULAR | Status: AC
  Administered 2012-12-24: 5 mg via INTRAMUSCULAR

## 2012-12-24 MED ORDER — ALUM & MAG HYDROXIDE-SIMETH 200-200-20 MG/5ML PO SUSP: 30.0000 mL | ORAL | Status: DC | PRN

## 2012-12-24 MED ORDER — QUETIAPINE FUMARATE 100 MG PO TABS: 100.0000 mg | ORAL_TABLET | Freq: Two times a day (BID) | ORAL | Status: DC | PRN

## 2012-12-24 MED ORDER — ONDANSETRON HCL 4 MG PO TABS: 4.0000 mg | ORAL_TABLET | Freq: Four times a day (QID) | ORAL | Status: DC | PRN

## 2012-12-24 MED ORDER — HALOPERIDOL LACTATE 5 MG/ML IJ SOLN
10.0000 mg | Freq: Once | INTRAMUSCULAR | Status: DC
  Filled 2012-12-24: qty 2

## 2012-12-24 MED ORDER — HYDROXYZINE HCL 25 MG PO TABS: 25.0000 mg | ORAL_TABLET | Freq: Four times a day (QID) | ORAL | Status: DC | PRN

## 2012-12-24 MED ORDER — GABAPENTIN 300 MG PO CAPS
300.0000 mg | ORAL_CAPSULE | Freq: Every day | ORAL | Status: DC
  Administered 2012-12-24: 300 mg via ORAL
  Filled 2012-12-24 (×2): qty 1

## 2012-12-24 MED ORDER — METOPROLOL TARTRATE 25 MG PO TABS
12.5000 mg | ORAL_TABLET | Freq: Two times a day (BID) | ORAL | Status: DC
  Administered 2012-12-24 – 2012-12-25 (×2): 12.5 mg via ORAL
  Filled 2012-12-24 (×2): qty 1

## 2012-12-24 MED ORDER — VITAMIN B-12 1000 MCG PO TABS
1000.0000 ug | ORAL_TABLET | Freq: Every morning | ORAL | Status: DC
  Administered 2012-12-25: 1000 ug via ORAL
  Filled 2012-12-24 (×2): qty 1

## 2012-12-24 MED ORDER — ZOLPIDEM TARTRATE 5 MG PO TABS: 5.0000 mg | ORAL_TABLET | Freq: Every evening | ORAL | Status: DC | PRN

## 2012-12-24 MED ORDER — AMLODIPINE BESYLATE 5 MG PO TABS
5.0000 mg | ORAL_TABLET | Freq: Every morning | ORAL | Status: DC
  Administered 2012-12-25: 5 mg via ORAL
  Filled 2012-12-24 (×2): qty 1

## 2012-12-24 MED ORDER — METOCLOPRAMIDE HCL 10 MG PO TABS
10.0000 mg | ORAL_TABLET | Freq: Three times a day (TID) | ORAL | Status: DC
  Administered 2012-12-24 – 2012-12-25 (×4): 10 mg via ORAL
  Filled 2012-12-24 (×4): qty 1

## 2012-12-24 MED ORDER — ZIPRASIDONE HCL 20 MG PO CAPS: 20.0000 mg | ORAL_CAPSULE | Freq: Two times a day (BID) | ORAL | Status: DC

## 2012-12-24 MED ORDER — POLYETHYLENE GLYCOL 3350 17 G PO PACK
17.0000 g | PACK | Freq: Every morning | ORAL | Status: DC
  Administered 2012-12-25: 17 g via ORAL
  Filled 2012-12-24: qty 1

## 2012-12-24 MED ORDER — DIPHENHYDRAMINE HCL 50 MG/ML IJ SOLN
25.0000 mg | Freq: Once | INTRAMUSCULAR | Status: AC
  Administered 2012-12-24: 25 mg via INTRAMUSCULAR
  Filled 2012-12-24: qty 1

## 2012-12-24 MED ORDER — LUBIPROSTONE 24 MCG PO CAPS
24.0000 ug | ORAL_CAPSULE | Freq: Two times a day (BID) | ORAL | Status: DC
  Administered 2012-12-24 – 2012-12-25 (×2): 24 ug via ORAL
  Filled 2012-12-24 (×3): qty 1

## 2012-12-24 MED ORDER — CLONAZEPAM 0.5 MG PO TABS: 0.5000 mg | ORAL_TABLET | Freq: Two times a day (BID) | ORAL | Status: DC | PRN

## 2012-12-24 MED ORDER — AMLODIPINE BESYLATE 5 MG PO TABS: 5.0000 mg | ORAL_TABLET | Freq: Every morning | ORAL | Status: DC

## 2012-12-24 MED ORDER — LORAZEPAM 2 MG/ML IJ SOLN
1.0000 mg | Freq: Once | INTRAMUSCULAR | Status: AC
  Administered 2012-12-24: 1 mg via INTRAMUSCULAR
  Filled 2012-12-24: qty 1

## 2012-12-24 MED ORDER — PANTOPRAZOLE SODIUM 40 MG PO TBEC
40.0000 mg | DELAYED_RELEASE_TABLET | Freq: Every day | ORAL | Status: DC
  Administered 2012-12-25: 40 mg via ORAL
  Filled 2012-12-24: qty 1

## 2012-12-24 MED ORDER — HALOPERIDOL LACTATE 5 MG/ML IJ SOLN
10.0000 mg | Freq: Once | INTRAMUSCULAR | Status: AC
  Administered 2012-12-24: 10 mg via INTRAMUSCULAR
  Filled 2012-12-24: qty 2

## 2012-12-24 MED ORDER — QUETIAPINE FUMARATE 100 MG PO TABS
100.0000 mg | ORAL_TABLET | Freq: Two times a day (BID) | ORAL | Status: DC
  Administered 2012-12-25 (×2): 100 mg via ORAL
  Filled 2012-12-24 (×2): qty 1

## 2012-12-24 MED ORDER — ASPIRIN EC 81 MG PO TBEC
81.0000 mg | DELAYED_RELEASE_TABLET | Freq: Every morning | ORAL | Status: DC
  Administered 2012-12-25: 81 mg via ORAL
  Filled 2012-12-24 (×2): qty 1

## 2012-12-24 MED ORDER — VITAMIN D (ERGOCALCIFEROL) 1.25 MG (50000 UNIT) PO CAPS
50000.0000 [IU] | ORAL_CAPSULE | ORAL | Status: DC
  Filled 2012-12-24: qty 1

## 2012-12-24 MED ORDER — ZIPRASIDONE MESYLATE 20 MG IM SOLR: 20.0000 mg | Freq: Once | INTRAMUSCULAR | Status: DC

## 2012-12-24 MED ORDER — LORATADINE 10 MG PO TABS
10.0000 mg | ORAL_TABLET | Freq: Every morning | ORAL | Status: DC
  Administered 2012-12-25: 10 mg via ORAL
  Filled 2012-12-24: qty 1

## 2012-12-24 MED ORDER — QUETIAPINE FUMARATE 300 MG PO TABS
600.0000 mg | ORAL_TABLET | Freq: Every day | ORAL | Status: DC
  Administered 2012-12-24: 600 mg via ORAL
  Filled 2012-12-24: qty 2

## 2012-12-24 MED ORDER — VITAMIN D3 25 MCG (1000 UNIT) PO TABS
1000.0000 [IU] | ORAL_TABLET | Freq: Every morning | ORAL | Status: DC
  Administered 2012-12-25: 1000 [IU] via ORAL
  Filled 2012-12-24 (×2): qty 1

## 2012-12-24 MED ORDER — BUPROPION HCL ER (XL) 300 MG PO TB24
300.0000 mg | ORAL_TABLET | Freq: Every morning | ORAL | Status: DC
  Administered 2012-12-25: 300 mg via ORAL
  Filled 2012-12-24 (×2): qty 1

## 2012-12-24 MED ORDER — DIVALPROEX SODIUM ER 500 MG PO TB24
500.0000 mg | ORAL_TABLET | Freq: Two times a day (BID) | ORAL | Status: DC
  Administered 2012-12-24 – 2012-12-25 (×2): 500 mg via ORAL
  Filled 2012-12-24 (×3): qty 1

## 2012-12-24 MED ORDER — SIMVASTATIN 10 MG PO TABS
10.0000 mg | ORAL_TABLET | Freq: Every day | ORAL | Status: DC
  Administered 2012-12-24 – 2012-12-25 (×2): 10 mg via ORAL
  Filled 2012-12-24 (×2): qty 1

## 2012-12-24 NOTE — ED Notes (Signed)
Pt here by [facility] from [facility].  Pt has hx of diabetes so [facility] will not take him back.  Pt is not taking his medication.  Pt is not eating and has poor hygiene.  He has been committed before.  Suicidal.  Pt has been exposing himself to the staff and community.  Pt has been urinating in the front yard.  Not allowing staff to check his CBG.

## 2012-12-24 NOTE — ED Notes (Signed)
Bed: WA10 Expected date:  Expected time:  Means of arrival:  Comments: 

## 2012-12-24 NOTE — ED Notes (Signed)
Patient and belongings have been wanded. Patient items are, watch, 2 leather bracelet, hat, red shirt, blue under shirt, blue jeans, belt, blue underwear, white socks and sneakers.

## 2012-12-24 NOTE — BH Assessment (Signed)
Tele Assessment Note   Shawn Baird is an 52 y.o. male.  -Clinician spoke with Shawn Baird.  He has completed the 1st opinion in support of the IVC papers done by group home staff.  Patient was brought in by [facility] after being IVC'ed by gh staff.  Patient is living at [facility] group home [id number]).  Patient has refused medications for the past two days.  This morning very early he started to sing and yell loudly.  He would no listen to staff and threatened to harm them.  He twice went into the yard and exposed himself and urinating in the yard.  Patient threatened to kill staff then kill himself.  He has threatened to shoot staff (including ED staff) then shoot himself.  Patient was seen by this clinician shortly after he had been given sedatives to help with calming.  Patient would not answer about whether he still wanted to kill himself.  He would say "sometimes I do, sometimes I don't" to many questions.  He said that someone at the group home "was getting on my nerves."  He did not answer about HI or A/V hallucinations.  To get collateral information the group home was called and this clinician spoke with Shawn Baird.  He said that yesterday patient had to go before the magistrate because another staff member had filed charges against patient for communicating threats.  Patient was quiet when he got back home from this but started being loud again.  The police were called again and patient calmed and went back to bed.  This morning pt starting being loud and threatening very early.  Patient was loud and disruptive in [facility] also.  Clinician did talk to Shawn Baird regarding whether they would take him back and they will.  Shawn Baird confirmed that patient is not on [employer] waiver.  His MCD originates from [facility]).  Clinician explained [place] [facility] to Shawn Baird (he was unfamiliar) and that they would do an assessment also.  Clinician let Shawn Baird know that  patient may not be granted a diversion to [facility] by [facility].  Clinician did call the [place] number for [place] [facility] [phone]. [phone]).  Shawn Baird answered and basic information was given.  She will come to see patient tomorrow (11/30) in AM.  She said that she would call before coming over.  She was given the number to the psych ED at [facility].  -Patient care was discussed with Shawn Baird.  He is fine with patient being seen by [place] [facility] tomorrow.  Axis I: Schizoaffective Disorder and Mental Retardation, Severity Unspecified Axis II: Mental retardation, severity unknown Axis III:  Past Medical History  Diagnosis Date  . Diabetes mellitus   . Anxiety   . Hypertension   . Hyperlipemia   . Mental disorder   . Schizoaffective disorder   . Impulsive control disorder  . Pancreatitis   . Otitis media   . Cancer   . HOH (hard of hearing)   . Tourette's disease   . Mental retardation   . Chronic constipation    Axis IV: educational problems, occupational problems and other psychosocial or environmental problems Axis V: 31-40 impairment in reality testing  Past Medical History:  Past Medical History  Diagnosis Date  . Diabetes mellitus   . Anxiety   . Hypertension   . Hyperlipemia   . Mental disorder   . Schizoaffective disorder   . Impulsive control disorder  . Pancreatitis   .  Otitis media   . Cancer   . HOH (hard of hearing)   . Tourette's disease   . Mental retardation   . Chronic constipation     Past Surgical History  Procedure Laterality Date  . 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 High blood pressure Sister   . High Cholesterol Sister   . Heart disease Mother   . Stroke Mother     Social History:  reports that he has never smoked. He has never used smokeless tobacco. He reports that he does not drink alcohol or use illicit drugs.  Additional Social History:  Alcohol /  Drug Use Pain Medications: See MAR from group home Prescriptions: See MAR from Group home Over the Counter: See MAR from group home History of alcohol / drug use?: No history of alcohol / drug abuse  CIWA: CIWA-Ar BP: 140/89 mmHg Pulse Rate: 89 COWS:    Allergies: No Known Allergies  Home Medications:  (Not in a hospital admission)  OB/GYN Status:  No LMP for male patient.  General Assessment Data [facility] of Assessment: [facility] Is this a Tele or Face-to-Face Assessment?: Tele Assessment Is this an Initial Assessment or a Re-assessment for this encounter?: Initial Assessment Living Arrangements: Other (Comment) [facility] group home) Can pt return to current living arrangement?: Yes Admission Status: Involuntary Is patient capable of signing voluntary admission?: No Transfer from: Group Home Referral Source: Other [facility] staff took out papers.)     [facility] Crisis Care Plan Living Arrangements: Other (Comment) [facility] group home) Name of Psychiatrist: Dr. Dolores Baird Name of Therapist: N/A     Risk to self Suicidal Ideation: Yes-Currently Present Suicidal Intent: No Is patient at risk for suicide?: Yes Suicidal Plan?: No-Not Currently/Within Last 6 Months Specify Current Suicidal Plan: Shoot himself Access to Means: No What has been your use of drugs/alcohol within the last 12 months?: None Previous Attempts/Gestures: No How many times?: 0 Other Self Harm Risks: None Triggers for Past Attempts: Unpredictable (Being off meds) Intentional Self Injurious Behavior: None Family Suicide History: Unknown Recent stressful life event(s):  (None specifically identified) Persecutory voices/beliefs?: No Depression: No Depression Symptoms: Feeling angry/irritable Substance abuse history and/or treatment for substance abuse?: No Suicide prevention information given to non-admitted patients: Not applicable  Risk to Others Homicidal Ideation: Yes-Currently  Present Thoughts of Harm to Others: Yes-Currently Present Comment - Thoughts of Harm to Others: Verbal threats to gh & ED staff to harm them. Current Homicidal Intent: No Current Homicidal Plan: No Access to Homicidal Means: No Identified Victim: GH staff History of harm to others?: No Assessment of Violence: On admission Violent Behavior Description: Hx of property destruction; spitting on others Does patient have access to weapons?: No Criminal Charges Pending?: No Does patient have a court date: No  Psychosis Hallucinations: None noted Delusions: None noted  Mental Status Report Appear/Hygiene:  (Casual in blue scrubs) Eye Contact: Good Motor Activity: Freedom of movement;Unremarkable Speech: Incoherent;Slow Level of Consciousness: Drowsy Mood: Helpless;Anxious Affect: Blunted;Depressed Anxiety Level: Minimal Thought Processes: Irrelevant Judgement: Impaired Orientation: Unable to assess Obsessive Compulsive Thoughts/Behaviors: None  Cognitive Functioning Concentration: Decreased Memory: Recent Impaired;Remote Impaired IQ: Below Average Level of Function: Severity Unspecified Insight: Poor Impulse Control: Poor Appetite: Good Weight Loss: 0 Weight Gain: 0 Sleep: No Change Total Hours of Sleep:  (Unknown) Vegetative Symptoms: None  ADLScreening [facility] Assessment Services) Patient's cognitive ability adequate to safely complete daily activities?: No Patient able to express need for assistance with ADLs?: No (Not consistently) Independently performs ADLs?: No  Prior Inpatient Therapy Prior Inpatient Therapy: Yes Prior Therapy Dates: unknown Prior Therapy Facilty/Provider(s): [facility] Reason for Treatment: Schizophrenia  Prior Outpatient Therapy Prior Outpatient Therapy: Yes Prior Therapy Dates: Current Prior Therapy Facilty/Provider(s): Dolores Frame, MD Reason for Treatment: Schizophrenia  ADL Screening (condition at time of admission) Patient's  cognitive ability adequate to safely complete daily activities?: No Is the patient deaf or have difficulty hearing?: Yes (Has hearing aids.  Not with him at hospital.) Does the patient have difficulty seeing, even when wearing glasses/contacts?: No Does the patient have difficulty concentrating, remembering, or making decisions?: Yes Patient able to express need for assistance with ADLs?: No (Not consistently) Does the patient have difficulty dressing or bathing?: Yes (Some verbal prompting needed.) Independently performs ADLs?: No Communication: Needs assistance Is this a change from baseline?: Pre-admission baseline Dressing (OT): Needs assistance Is this a change from baseline?: Pre-admission baseline Grooming: Needs assistance Is this a change from baseline?: Pre-admission baseline Feeding: Independent Bathing: Needs assistance Is this a change from baseline?: Pre-admission baseline Toileting: Independent In/Out Bed: Independent Walks in Home: Independent Does the patient have difficulty walking or climbing Baird?: No Weakness of Legs: None Weakness of Arms/Hands: None       Abuse/Neglect Assessment (Assessment to be complete while patient is alone) Physical Abuse: Denies Verbal Abuse: Denies Sexual Abuse: Denies Exploitation of patient/patient's resources: Denies Self-Neglect: Denies Values / Beliefs Cultural Requests During Hospitalization: None Spiritual Requests During Hospitalization: None   Advance Directives (For Healthcare) Advance Directive: Patient does not have advance directive;Patient would not like information    Additional Information 1:1 In Past 12 Months?: Yes CIRT Risk: Yes Elopement Risk: Yes Does patient have medical clearance?: Yes     Disposition:  Disposition Initial Assessment Completed for this Encounter: Yes Disposition of Patient: Other dispositions Other disposition(s): Other (Comment) (Referral made to [place] [facility])  Beatriz Stallion  Ray 12/24/2012 11:31 PM

## 2012-12-24 NOTE — ED Notes (Addendum)
Pt has been moved to room 10 after medication given. Pt continues to yell out, [employer] at bedside until pt is calm and resting. Victorino Dike RN [facility] ([facility]) aware of the need for a sitter

## 2012-12-24 NOTE — ED Notes (Signed)
Shawn Baird, of group home, called and stated pt is refusing medications and escalating behaviors at group home.  Pulled his pants down and peed outside in front of others.  Ms. Ashley Royalty states pt also threatened to kill her and threatened to shoot himself.

## 2012-12-24 NOTE — ED Notes (Signed)
Pt threatening to kill himself and those around him.  "I'm gonna put a mother fucking bullet in your heads".  I'm gonna shoot myself with my mother fucking gun."

## 2012-12-24 NOTE — ED Provider Notes (Signed)
CSN: [id number]     Arrival date & time 12/24/12  1422 History   This chart was scribed for non-physician practitioner Arthor Captain, PA-C, working with Gilda Crease, MD, by Yevette Edwards, ED Scribe. This patient was seen in room WTR5/WTR5 and the patient's care was started at 3:17 PM.  First MD Initiated Contact with Patient 12/24/12 1437     Chief Complaint  Patient presents with  . Medical Clearance   The history is provided by the patient, the police and medical records. The history is limited by the condition of the patient. No language interpreter was used.   Level 5 Caveat: Psychiatric Disorder  HPI Comments: Shawn Baird is a 52 y.o. male, with a h/o schizoaffective disorder, who presents to the Emergency Department for medical clearance. The police brought him to the ED; [facility] will not accept him because he has a h/o DM. The group home at which he resides reports the pt has been noncompliant with his medication and has not allowed them to check his CBGs. Also according to staff at the group home, the pt has not been eating or grooming well, has been exposing himself to the staff at [facility], and he has been urinating in the front yard of the facilities. The pt states he does not want to talk to anyone.   Past Medical History  Diagnosis Date  . Diabetes mellitus   . Anxiety   . Hypertension   . Hyperlipemia   . Mental disorder   . Schizoaffective disorder   . Impulsive control disorder  . Pancreatitis   . Otitis media   . Cancer   . HOH (hard of hearing)   . Tourette's disease   . Mental retardation   . Chronic constipation    Past Surgical History  Procedure Laterality Date  . 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 High blood pressure Sister   . High Cholesterol Sister   . Heart disease Mother   . Stroke Mother    History  Substance Use Topics  . Smoking status: Never Smoker   .  Smokeless tobacco: Never Used  . Alcohol Use: No    Review of Systems  Unable to perform ROS: Psychiatric disorder    Allergies  Review of patient's allergies indicates no known allergies.  Home Medications   Current Outpatient Rx  Name  Route  Sig  Dispense  Refill  . amLODipine (NORVASC) 5 MG tablet   Oral   Take 5 mg by mouth every morning.          Marland Kitchen aspirin EC 81 MG tablet   Oral   Take 81 mg by mouth every morning.          Marland Kitchen buPROPion (WELLBUTRIN XL) 300 MG 24 hr tablet   Oral   Take 300 mg by mouth every morning.         . cholecalciferol (VITAMIN D) 1000 UNITS tablet   Oral   Take 1,000 Units by mouth every morning.         . clonazePAM (KLONOPIN) 0.5 MG tablet   Oral   Take 0.5 mg by mouth every 12 (twelve) hours as needed (For agitation.).         Marland Kitchen dicyclomine (BENTYL) 20 MG tablet   Oral   Take 20 mg by mouth 4 (four) times daily.          Marland Kitchen  divalproex (DEPAKOTE ER) 500 MG 24 hr tablet   Oral   Take 500 mg by mouth 2 (two) times daily.         Marland Kitchen gabapentin (NEURONTIN) 300 MG capsule   Oral   Take 300 mg by mouth at bedtime.         . hydrOXYzine (ATARAX/VISTARIL) 25 MG tablet   Oral   Take 25 mg by mouth 4 (four) times daily as needed for anxiety.         . insulin regular (HUMULIN R) 100 units/mL injection   Subcutaneous   Inject 2-12 Units into the skin 4 (four) times daily -  before meals and at bedtime. He uses a sliding scale: 150-200 = 2 units, 201-250 = 4 units, 251-300 = 6 units, 301-350 = 8 units, 351-400 = 10 units or 401-450 = 12 units.         Marland Kitchen loratadine (CLARITIN) 10 MG tablet   Oral   Take 10 mg by mouth every morning.          . lubiprostone (AMITIZA) 24 MCG capsule   Oral   Take 24 mcg by mouth 2 (two) times daily.         . metFORMIN (GLUCOPHAGE-XR) 500 MG 24 hr tablet   Oral   Take 500 mg by mouth every morning.         . metoCLOPramide (REGLAN) 10 MG tablet   Oral   Take 10 mg by mouth 4  (four) times daily -  before meals and at bedtime.          . metoprolol tartrate (LOPRESSOR) 25 MG tablet   Oral   Take 12.5 mg by mouth 2 (two) times daily.         Marland Kitchen omeprazole (PRILOSEC) 20 MG capsule   Oral   Take 20 mg by mouth every morning.          . ondansetron (ZOFRAN) 4 MG tablet   Oral   Take 4 mg by mouth every 6 (six) hours as needed for nausea.         . polyethylene glycol (MIRALAX / GLYCOLAX) packet   Oral   Take 17 g by mouth every morning.          . pravastatin (PRAVACHOL) 20 MG tablet   Oral   Take 20 mg by mouth at bedtime.          Marland Kitchen QUEtiapine (SEROQUEL) 100 MG tablet   Oral   Take 100 mg by mouth 2 (two) times daily.         . QUEtiapine (SEROQUEL) 200 MG tablet   Oral   Take 600 mg by mouth at bedtime.         Marland Kitchen QUEtiapine (SEROQUEL) 50 MG tablet   Oral   Take 100 mg by mouth every 12 (twelve) hours as needed (For agitation.).         Marland Kitchen testosterone cypionate (DEPOTESTOTERONE CYPIONATE) 100 MG/ML injection   Intramuscular   Inject 100 mg into the muscle every 28 (twenty-eight) days. For IM use only         . traMADol (ULTRAM) 50 MG tablet   Oral   Take by mouth 3 (three) times daily as needed (For abdominal pain.).         Marland Kitchen vitamin B-12 (CYANOCOBALAMIN) 1000 MCG tablet   Oral   Take 1,000 mcg by mouth every morning.          Marland Kitchen  Vitamin D, Ergocalciferol, (DRISDOL) 50000 UNITS CAPS capsule   Oral   Take 50,000 Units by mouth 2 (two) times a week. He takes on Tuesdays and Thursdays.          Triage Vitals: BP 150/93  Pulse 83  Resp 16  SpO2 100%  Physical Exam  Nursing note and vitals reviewed. Constitutional: He is oriented to person, place, and time. He appears well-developed and well-nourished. No distress.  HENT:  Head: Normocephalic and atraumatic.  Eyes: EOM are normal.  Neck: Neck supple. No tracheal deviation present.  Cardiovascular: Normal rate.   Pulmonary/Chest: Effort normal. No respiratory  distress.  Musculoskeletal: Normal range of motion.  Neurological: He is alert and oriented to person, place, and time.  Skin: Skin is warm and dry.  Psychiatric: He has a normal mood and affect. His behavior is normal.    ED Course  Procedures (including critical care time)  DIAGNOSTIC STUDIES: Oxygen Saturation is 100% on room air, normal by my interpretation.    COORDINATION OF CARE:  3:24 PM- Discussed treatment plan with patient, and the patient agreed to the plan.   Labs Review Labs Reviewed  COMPREHENSIVE METABOLIC PANEL - Abnormal; Notable for the following:    Potassium 3.1 (*)    Glucose, Bld 108 (*)    Creatinine, Ser 1.84 (*)    Total Bilirubin 0.2 (*)    GFR calc non Af Amer 40 (*)    GFR calc Af Amer 47 (*)    All other components within normal limits  CBC  ETHANOL  URINE RAPID DRUG SCREEN (HOSP PERFORMED)   Imaging Review No results found.  EKG Interpretation   None       MDM   1. Aggressive behavior of adult   2. Mental retardation   3. HOH (hard of hearing), bilateral   4. DM (diabetes mellitus)    Patient with psychotic, aggressive behavior. Patient given several doses of haldol with moderate effect. His siter came to the ED and patient then allowed me to examine him. According to IVC paperwork patient is resident of a group home. He has been increasingly belligerent, violent toward staff ad residents, noncompliant with medications and glucose checks, exposing himself to staff and residents. Patient exhibits aggressive behavior here. Responding to internal stimuli. He will need evaluation from psych. Stabilization and likely inpatient treatment.   I personally performed the services described in this documentation, which was scribed in my presence. The recorded information has been reviewed and is accurate.     Arthor Captain, PA-C 12/25/12 [phone]

## 2012-12-24 NOTE — ED Notes (Signed)
Pt belongings placed in locker 32 °

## 2012-12-24 NOTE — ED Notes (Signed)
Will try to obtain UA when pt calms down

## 2012-12-24 NOTE — ED Notes (Signed)
Pt still not following commands when trying to instruct him about need for urine

## 2012-12-25 ENCOUNTER — Encounter (HOSPITAL_COMMUNITY): Payer: Self-pay | Admitting: Registered Nurse

## 2012-12-25 LAB — RAPID URINE DRUG SCREEN, HOSP PERFORMED
Amphetamines: NOT DETECTED
Benzodiazepines: NOT DETECTED
Cocaine: NOT DETECTED
Opiates: NOT DETECTED

## 2012-12-25 NOTE — Progress Notes (Addendum)
CSW and Psych NP spoke with Joselyn Glassman, group home supervisor.  CSW and Pysch NP explained that he was medically and psychiatrically cleared for d/c. CSW and Psych NP reviewed their recommendations for plans of care post discharge--specifically connecting with [facility] care coordinator, and connecting with pt's psychiatrist, Dr. Moises Blood. CSW also reviewed the recommendations provided by [facility], which are placed in d/c summary.   Mr. [facility] stated he would send someone out now to pick up pt.  York Spaniel [place], [phone]     ED CSW  5:17pm  ____________  Of note, per [facility] at group home, please be aware that pt's Medicaid is for [facility] not [facility]). This makes some of [place] [facility] [name]'s recommendations difficult, because pt does not have funding here.   This is not a reason to not discharge him. Pt still medically and psychiatrically cleared to go. [facility] stated he was already in process of working with pt's guardian to link him with services.  York Spaniel [place], [phone]     ED CSW  6:09pm

## 2012-12-25 NOTE — Consult Note (Addendum)
[facility] Face-to-Face Psychiatry Consult   Reason for Consult:  Refusal to take medications and mood disorder Referring Physician:  EDP  Shawn Baird is an 52 y.o. male.  Assessment: AXIS I:  Mood Disorder NOS AXIS II:  Deferred AXIS III:   Past Medical History  Diagnosis Date  . Diabetes mellitus   . Anxiety   . Hypertension   . Hyperlipemia   . Mental disorder   . Schizoaffective disorder   . Impulsive control disorder  . Pancreatitis   . Otitis media   . Cancer   . HOH (hard of hearing)   . Tourette's disease   . Mental retardation   . Chronic constipation    AXIS IV:  other psychosocial or environmental problems and problems related to social environment AXIS V:  51-60 moderate symptoms  Plan:  No evidence of imminent risk to self or others at present.   Patient does not meet criteria for psychiatric inpatient admission. Supportive therapy provided about ongoing stressors. Discussed crisis plan, support from social network, calling 911, coming to the Emergency Department, and calling Suicide Hotline.  Subjective:   Shawn Baird is a 52 y.o. male patient.  HPI:  Patient states "I don't like nobody going through my cloths; you don't like people going through your cloths."  Patient states that when he gets up set "I just go be by myself.  I go mow the yard or sit by myself out side.  They get on my nerves and I go by myself.  They always call the police on me and beaten me."  Patient states that he takes his medicine every morning.  Patient also states "No I don't want hurt nobody; no not myself."  When asked about fighting staff patient also responds "No I don't do that"  Patient has been cooperative here in hospital. SW/TTS will speak with staff at group home.  Patient denies suicidal ideation, homicidal ideation, psychosis, and paranoia.  Patient does state that he needs a hearing aid and interview would have to ask the same question several times before patient could  understand what was being asked.    Past Psychiatric History: Past Medical History  Diagnosis Date  . Diabetes mellitus   . Anxiety   . Hypertension   . Hyperlipemia   . Mental disorder   . Schizoaffective disorder   . Impulsive control disorder  . Pancreatitis   . Otitis media   . Cancer   . HOH (hard of hearing)   . Tourette's disease   . Mental retardation   . Chronic constipation     reports that he has never smoked. He has never used smokeless tobacco. He reports that he does not drink alcohol or use illicit drugs. Family History  Problem Relation Age of Onset  . High blood pressure Sister   . High Cholesterol Sister   . Heart disease Mother   . Stroke Mother    Family History Substance Abuse: No Family Supports: Yes, List: (Sister is his guardian.) Living Arrangements: Other (Comment) Engineer, building services [facility]) Can pt return to current living arrangement?: Yes Abuse/Neglect [facility]) Physical Abuse: Denies Verbal Abuse: Denies Sexual Abuse: Denies Allergies:  No Known Allergies  ACT Assessment Complete:  Yes:    Educational Status    Risk to Self: Risk to self Suicidal Ideation: Yes-Currently Present Suicidal Intent: No Is patient at risk for suicide?: Yes Suicidal Plan?: No-Not Currently/Within Last 6 Months Specify Current Suicidal Plan: Shoot himself Access to Means:  No What has been your use of drugs/alcohol within the last 12 months?: None Previous Attempts/Gestures: No How many times?: 0 Other Self Harm Risks: None Triggers for Past Attempts: Unpredictable (Being off meds) Intentional Self Injurious Behavior: None Family Suicide History: Unknown Recent stressful life event(s):  (None specifically identified) Persecutory voices/beliefs?: No Depression: No Depression Symptoms: Feeling angry/irritable Substance abuse history and/or treatment for substance abuse?: No (BAL <11   UDS negative) Suicide prevention information given to non-admitted  patients: Not applicable  Risk to Others: Risk to Others Homicidal Ideation: Yes-Currently Present Thoughts of Harm to Others: Yes-Currently Present Comment - Thoughts of Harm to Others: Verbal threats to gh & ED staff to harm them. Current Homicidal Intent: No Current Homicidal Plan: No Access to Homicidal Means: No Identified Victim: GH staff History of harm to others?: No Assessment of Violence: On admission Violent Behavior Description: Hx of property destruction; spitting on others Does patient have access to weapons?: No Criminal Charges Pending?: No Does patient have a court date: No  Abuse: Abuse/Neglect Assessment (Assessment to be complete while patient is alone) Physical Abuse: Denies Verbal Abuse: Denies Sexual Abuse: Denies Exploitation of patient/patient's resources: Denies Self-Neglect: Denies  Prior Inpatient Therapy: Prior Inpatient Therapy Prior Inpatient Therapy: Yes Prior Therapy Dates: unknown Prior Therapy Facilty/Provider[facility]): [facility] Reason for Treatment: Schizophrenia  Prior Outpatient Therapy: Prior Outpatient Therapy Prior Outpatient Therapy: Yes Prior Therapy Dates: Current Prior Therapy Facilty/Provider(s): Dolores Frame, MD Reason for Treatment: Schizophrenia  Additional Information: Additional Information 1:1 In Past 12 Months?: Yes CIRT Risk: Yes Elopement Risk: Yes Does patient have medical clearance?: Yes                  Objective: Blood pressure 112/72, pulse 75, temperature 97.5 F (36.4 C), temperature source Oral, resp. rate 18, SpO2 95.00%.There is no weight on file to calculate BMI. Results for orders placed during the hospital encounter of 12/24/12 (from the past 72 hour(s))  CBC     Status: None   Collection Time    12/24/12  2:48 PM      Result Value Range   WBC 7.2  4.0 - 10.5 K/uL   RBC 4.94  4.22 - 5.81 MIL/uL   Hemoglobin 15.4  13.0 - 17.0 g/dL   HCT 16.1  09.6 - 04.5 %   MCV 92.1  78.0 - 100.0 fL    MCH 31.2  26.0 - 34.0 pg   MCHC 33.8  30.0 - 36.0 g/dL   RDW 40.9  81.1 - 91.4 %   Platelets 235  150 - 400 K/uL  COMPREHENSIVE METABOLIC PANEL     Status: Abnormal   Collection Time    12/24/12  2:48 PM      Result Value Range   Sodium 140  135 - 145 mEq/L   Potassium 3.1 (*) 3.5 - 5.1 mEq/L   Chloride 102  96 - 112 mEq/L   CO2 26  19 - 32 mEq/L   Glucose, Bld 108 (*) 70 - 99 mg/dL   BUN 23  6 - 23 mg/dL   Creatinine, Ser 7.82 (*) 0.50 - 1.35 mg/dL   Calcium 9.4  8.4 - 95.6 mg/dL   Total Protein 8.1  6.0 - 8.3 g/dL   Albumin 4.3  3.5 - 5.2 g/dL   AST 23  0 - 37 U/L   ALT 19  0 - 53 U/L   Alkaline Phosphatase 72  39 - 117 U/L   Total Bilirubin 0.2 (*)  0.3 - 1.2 mg/dL   GFR calc non Af Amer 40 (*) >90 mL/min   GFR calc Af Amer 47 (*) >90 mL/min   Comment: (NOTE)     The eGFR has been calculated using the CKD EPI equation.     This calculation has not been validated in all clinical situations.     eGFR's persistently <90 mL/min signify possible Chronic Kidney     Disease.  ETHANOL     Status: None   Collection Time    12/24/12  2:48 PM      Result Value Range   Alcohol, Ethyl (B) <11  0 - 11 mg/dL   Comment:            LOWEST DETECTABLE LIMIT FOR     SERUM ALCOHOL IS 11 mg/dL     FOR MEDICAL PURPOSES ONLY  URINE RAPID DRUG SCREEN (HOSP PERFORMED)     Status: None   Collection Time    12/25/12  1:34 AM      Result Value Range   Opiates NONE DETECTED  NONE DETECTED   Cocaine NONE DETECTED  NONE DETECTED   Benzodiazepines NONE DETECTED  NONE DETECTED   Amphetamines NONE DETECTED  NONE DETECTED   Tetrahydrocannabinol NONE DETECTED  NONE DETECTED   Barbiturates NONE DETECTED  NONE DETECTED   Comment:            DRUG SCREEN FOR MEDICAL PURPOSES     ONLY.  IF CONFIRMATION IS NEEDED     FOR ANY PURPOSE, NOTIFY LAB     WITHIN 5 DAYS.                LOWEST DETECTABLE LIMITS     FOR URINE DRUG SCREEN     Drug Class       Cutoff (ng/mL)     Amphetamine      1000      Barbiturate      200     Benzodiazepine   200     Tricyclics       300     Opiates          300     Cocaine          300     THC              50    Current Facility-Administered Medications  Medication Dose Route Frequency Provider Last Rate Last Dose  . alum & mag hydroxide-simeth (MAALOX/MYLANTA) 200-200-20 MG/5ML suspension 30 mL  30 mL Oral PRN Arthor Captain, PA-C      . amLODipine (NORVASC) tablet 5 mg  5 mg Oral q morning - 10a Gilda Crease, MD   5 mg at 12/25/12 0947  . aspirin EC tablet 81 mg  81 mg Oral q morning - 10a Arthor Captain, PA-C   81 mg at 12/25/12 0946  . buPROPion (WELLBUTRIN XL) 24 hr tablet 300 mg  300 mg Oral q morning - 10a Arthor Captain, PA-C   300 mg at 12/25/12 0947  . cholecalciferol (VITAMIN D) tablet 1,000 Units  1,000 Units Oral q morning - 10a Arthor Captain, PA-C   1,000 Units at 12/25/12 0947  . clonazePAM (KLONOPIN) tablet 0.5 mg  0.5 mg Oral Q12H PRN Arthor Captain, PA-C      . dicyclomine (BENTYL) tablet 20 mg  20 mg Oral QID Arthor Captain, PA-C   20 mg at 12/25/12 0947  . divalproex (DEPAKOTE ER) 24 hr tablet 500  mg  500 mg Oral BID Arthor Captain, PA-C   500 mg at 12/25/12 0946  . gabapentin (NEURONTIN) capsule 300 mg  300 mg Oral QHS Arthor Captain, PA-C   300 mg at 12/24/12 2032  . hydrOXYzine (ATARAX/VISTARIL) tablet 25 mg  25 mg Oral QID PRN Arthor Captain, PA-C      . loratadine (CLARITIN) tablet 10 mg  10 mg Oral q morning - 10a Arthor Captain, PA-C   10 mg at 12/25/12 0947  . lubiprostone (AMITIZA) capsule 24 mcg  24 mcg Oral BID Arthor Captain, PA-C   24 mcg at 12/25/12 0946  . metoCLOPramide (REGLAN) tablet 10 mg  10 mg Oral TID AC & HS Arthor Captain, PA-C   10 mg at 12/25/12 0839  . metoprolol tartrate (LOPRESSOR) tablet 12.5 mg  12.5 mg Oral BID Arthor Captain, PA-C   12.5 mg at 12/25/12 0945  . ondansetron (ZOFRAN) tablet 4 mg  4 mg Oral Q6H PRN Arthor Captain, PA-C      . pantoprazole (PROTONIX) EC tablet 40 mg  40 mg Oral  Daily Arthor Captain, PA-C   40 mg at 12/25/12 0947  . polyethylene glycol (MIRALAX / GLYCOLAX) packet 17 g  17 g Oral q morning - 10a Arthor Captain, PA-C   17 g at 12/25/12 0948  . QUEtiapine (SEROQUEL) tablet 100 mg  100 mg Oral BID Arthor Captain, PA-C   100 mg at 12/25/12 0839  . QUEtiapine (SEROQUEL) tablet 100 mg  100 mg Oral Q12H PRN Arthor Captain, PA-C      . QUEtiapine (SEROQUEL) tablet 600 mg  600 mg Oral QHS Arthor Captain, PA-C   600 mg at 12/24/12 2059  . simvastatin (ZOCOR) tablet 10 mg  10 mg Oral q1800 Arthor Captain, PA-C   10 mg at 12/24/12 2036  . vitamin B-12 (CYANOCOBALAMIN) tablet 1,000 mcg  1,000 mcg Oral q morning - 10a Arthor Captain, PA-C   1,000 mcg at 12/25/12 0946  . [START ON 12/26/2012] Vitamin D (Ergocalciferol) (DRISDOL) capsule 50,000 Units  50,000 Units Oral 2 times weekly Arthor Captain, PA-C      . zolpidem (AMBIEN) tablet 5 mg  5 mg Oral QHS PRN Arthor Captain, PA-C       Current Outpatient Prescriptions  Medication Sig Dispense Refill  . amLODipine (NORVASC) 5 MG tablet Take 5 mg by mouth every morning.       Marland Kitchen aspirin EC 81 MG tablet Take 81 mg by mouth every morning.       Marland Kitchen buPROPion (WELLBUTRIN XL) 300 MG 24 hr tablet Take 300 mg by mouth every morning.      . cholecalciferol (VITAMIN D) 1000 UNITS tablet Take 1,000 Units by mouth every morning.      . clonazePAM (KLONOPIN) 0.5 MG tablet Take 0.5 mg by mouth every 12 (twelve) hours as needed (For agitation.).      Marland Kitchen dicyclomine (BENTYL) 20 MG tablet Take 20 mg by mouth 4 (four) times daily.       . divalproex (DEPAKOTE ER) 500 MG 24 hr tablet Take 500 mg by mouth 2 (two) times daily.      Marland Kitchen gabapentin (NEURONTIN) 300 MG capsule Take 300 mg by mouth at bedtime.      . hydrOXYzine (ATARAX/VISTARIL) 25 MG tablet Take 25 mg by mouth 4 (four) times daily as needed for anxiety.      . insulin regular (HUMULIN R) 100 units/mL injection Inject 2-12 Units into the skin 4 (four) times daily -  before meals and  at bedtime. He uses a sliding scale: 150-200 = 2 units, 201-250 = 4 units, 251-300 = 6 units, 301-350 = 8 units, 351-400 = 10 units or 401-450 = 12 units.      Marland Kitchen loratadine (CLARITIN) 10 MG tablet Take 10 mg by mouth every morning.       . lubiprostone (AMITIZA) 24 MCG capsule Take 24 mcg by mouth 2 (two) times daily.      . metFORMIN (GLUCOPHAGE-XR) 500 MG 24 hr tablet Take 500 mg by mouth every morning.      . metoCLOPramide (REGLAN) 10 MG tablet Take 10 mg by mouth 4 (four) times daily -  before meals and at bedtime.       . metoprolol tartrate (LOPRESSOR) 25 MG tablet Take 12.5 mg by mouth 2 (two) times daily.      Marland Kitchen omeprazole (PRILOSEC) 20 MG capsule Take 20 mg by mouth every morning.       . ondansetron (ZOFRAN) 4 MG tablet Take 4 mg by mouth every 6 (six) hours as needed for nausea.      . polyethylene glycol (MIRALAX / GLYCOLAX) packet Take 17 g by mouth every morning.       . pravastatin (PRAVACHOL) 20 MG tablet Take 20 mg by mouth at bedtime.       Marland Kitchen QUEtiapine (SEROQUEL) 100 MG tablet Take 100 mg by mouth 2 (two) times daily.      . QUEtiapine (SEROQUEL) 200 MG tablet Take 600 mg by mouth at bedtime.      Marland Kitchen QUEtiapine (SEROQUEL) 50 MG tablet Take 100 mg by mouth every 12 (twelve) hours as needed (For agitation.).      Marland Kitchen testosterone cypionate (DEPOTESTOTERONE CYPIONATE) 100 MG/ML injection Inject 100 mg into the muscle every 28 (twenty-eight) days. For IM use only      . traMADol (ULTRAM) 50 MG tablet Take by mouth 3 (three) times daily as needed (For abdominal pain.).      Marland Kitchen vitamin B-12 (CYANOCOBALAMIN) 1000 MCG tablet Take 1,000 mcg by mouth every morning.       . Vitamin D, Ergocalciferol, (DRISDOL) 50000 UNITS CAPS capsule Take 50,000 Units by mouth 2 (two) times a week. He takes on Tuesdays and Thursdays.        Psychiatric Specialty Exam:     Blood pressure 112/72, pulse 75, temperature 97.5 F (36.4 C), temperature source Oral, resp. rate 18, SpO2 95.00%.There is no weight  on file to calculate BMI.  General Appearance: Fairly Groomed  Patent attorney::  Good  Speech:  Clear and Coherent and Normal Rate  Volume:  Normal  Mood:  "I feel good"  Affect:  Congruent  Thought Process:  Circumstantial  Orientation:  Other:  Person and Place  Thought Content:  "I don't want to hurt nobody"  Suicidal Thoughts:  No  Homicidal Thoughts:  No  Memory:  Immediate;   Fair Recent;   Fair  Judgement:  Fair  Insight:  Fair  Psychomotor Activity:  Normal  Concentration:  Fair  Recall:  Fair  Akathisia:  No  Handed:  Right  AIMS (if indicated):     Assets:  Communication Skills Desire for Improvement Housing  Sleep:      Face to face consult/interview with Dr. Tawni Carnes  Treatment Plan Summary: Assess Valproic Acid.  Discharge  Disposition:  Patient does not meet criteria for inpatient treatment.  Check Valproic acid level it with in therapeutic range no changes.  Discharge patient  back to group home to follow up with primary psych provider.    Assunta Found, FNP-BC 12/25/2012 11:39 AM   12/26/2015 @ 5:30 PM :  Group home staff [facility]) has refused to pick up patient stating they have taken out a warrant for communicating threat and that they would only take if medication changes have been done of if patient has been admitted to psychiatric hospital and medication adjustment have been made.  Spoke with Annalee Genta (sister and guardian of patient ) states "The behavior started last week when one of the staff persons threw away some of the patients cloths.  "At the group home they washed his colored and white clothes together and messed up the white clothes.  I went and brought him all new cloths (under closes, sweat suits, shoes.Marland Kitchen). I told the girl at the group home not to mess with his old cloths unless he gives them to you.  She said "I been doing this for 20 yrs and I know how to get them from him"  Well I guess she just went in and thew the cloths away without  asking and he got up set like I told her he would and threaten her about messing with his cloths.  So she took out a warrant.  Well the warrant wasn't served until this Friday when the police came to serve it  And then he thought that the guy working was calling the police on him for nothing and got up set again. I don't have enough room form him to live with me.  I am going to talk to Mr. Wardell Heath to see if he will let him stay there until I can find another group home for him."  Spoke to Mr. Wardell Heath and discussed patient behavior.  Informed that patient had not been agitated or violent since he was here in the hospital and that patient does not meet criteria for inpatient treatment.  Patient should be taken back to his primary psychiatrist and medication management discussed.  Mr. Wardell Heath states that he wasn't aware that patient was being discharged and that someone had refused to pick up.  States that patient is unable to get day services because [place] has out of [place] medicaid and sometimes patient may get frustrated in the home and paces because he has nothing to do.  Informed Mr. Wardell Heath that we could not admit patient for those reasons.  Also informed that we would put on discharge instructions for patient to follow up with primary. States that he would be in to pick up patient.  Patient continues to be calm and corporative and has not demonstrated any agitation or violent behaviors.  Patient continues to deny suicidal/homicidal ideation, psychosis, and paranoia.  Patient does not meet criteria for inpatient treatment.    B.  FNP-BC Family Nurse Practitioner, Board Certified   B.  FNP-BC Family Nurse Practitioner, Board Certified

## 2012-12-25 NOTE — ED Notes (Addendum)
Staff from [facility] here to transport home. D/C instructions given to staff member. Belongings bag x1 returned. Ambulatory without difficulty. No complaints voiced. Escorted by mental health tech to group home staff member at front of hospital.

## 2012-12-25 NOTE — Progress Notes (Addendum)
Per Juleen China, she confirms that pt, Shawn Baird, was arrested and released on 11/28 for communicating threats towards Hiram Gash. Hiram Gash pressed the charges.  Lois at group home has tried to fax documentation multiple times. Has not come through. CSW told her that confirmation from magistrate is enough documentation for now, and CSW will contact in case further documentation is needed.  York Spaniel [place], [phone]     ED CSW  4:15 pm _______________________________________________  CSW called pt's group home to inform them that pt was medically and psychiatrically cleared for d/c.   CSW spoke with Ms. Louis at group home. She stated that her boss told her that pt cannot come back today because his behavior has been so bad. Ms. Lissa Hoard stated that she had taken out charges against pt for assaulting her. CSW asked Ms. Louis to fax this paperwork, for verification. CSW cannot d/c a pt back to a facility if charges are filed, so CSW awaiting this paperwork.  CSW asked to speak with supervisor to inform him that CSW might need to file a complaint against group home if they are unable to pick him up today. Ms. Lissa Hoard told CSW that supervisor was in church, but that she would give him the message. CSW pressed for supervisor's contact information, but Ms. Louis did not provide that information.  CSW to await call and/or fax within hour. CSW to call the supervisor number she has on file (Mr. Wardell Heath, [phone]) if she does not hear within hour. CSW will update as situation evolves.  York Spaniel [place], [phone]     ED CSW  1:40pm

## 2012-12-25 NOTE — Consult Note (Signed)
Pt was interviewed with NP. Chart reviewed. Agree with above assessment and plan. Pt denies current SI/HI/AVH. Pt currently calm and cooperative. Will discharge pt back to group home.

## 2012-12-26 NOTE — ED Provider Notes (Signed)
Medical screening examination/treatment/procedure(s) were performed by non-physician practitioner and as supervising physician I was immediately available for consultation/collaboration.  EKG Interpretation   None         Christopher J. Pollina, MD 12/26/12 1628 

## 2012-12-26 NOTE — Consult Note (Signed)
Addendum written by FNP was reviewed. Agree with plan to discharge pt to group home.

## 2013-01-20 ENCOUNTER — Emergency Department (HOSPITAL_COMMUNITY)
Admission: EM | Admit: 2013-01-20 | Discharge: 2013-01-21 | Disposition: A | Discharge status: Home / Self Care | Payer: Medicare Other | Attending: Emergency Medicine | Admitting: Emergency Medicine

## 2013-01-20 ENCOUNTER — Encounter (HOSPITAL_COMMUNITY): Payer: Self-pay | Admitting: Emergency Medicine

## 2013-01-20 DIAGNOSIS — R509 Fever, unspecified: Secondary | ICD-10-CM | POA: Insufficient documentation

## 2013-01-20 DIAGNOSIS — R45850 Homicidal ideations: Secondary | ICD-10-CM | POA: Insufficient documentation

## 2013-01-20 DIAGNOSIS — Z8719 Personal history of other diseases of the digestive system: Secondary | ICD-10-CM | POA: Insufficient documentation

## 2013-01-20 DIAGNOSIS — F411 Generalized anxiety disorder: Secondary | ICD-10-CM | POA: Insufficient documentation

## 2013-01-20 DIAGNOSIS — E785 Hyperlipidemia, unspecified: Secondary | ICD-10-CM | POA: Insufficient documentation

## 2013-01-20 DIAGNOSIS — Z7982 Long term (current) use of aspirin: Secondary | ICD-10-CM | POA: Insufficient documentation

## 2013-01-20 DIAGNOSIS — F209 Schizophrenia, unspecified: Secondary | ICD-10-CM

## 2013-01-20 DIAGNOSIS — I10 Essential (primary) hypertension: Secondary | ICD-10-CM | POA: Insufficient documentation

## 2013-01-20 DIAGNOSIS — F329 Major depressive disorder, single episode, unspecified: Secondary | ICD-10-CM | POA: Insufficient documentation

## 2013-01-20 DIAGNOSIS — R45851 Suicidal ideations: Secondary | ICD-10-CM

## 2013-01-20 DIAGNOSIS — F3289 Other specified depressive episodes: Secondary | ICD-10-CM | POA: Insufficient documentation

## 2013-01-20 DIAGNOSIS — F259 Schizoaffective disorder, unspecified: Secondary | ICD-10-CM

## 2013-01-20 DIAGNOSIS — Z79899 Other long term (current) drug therapy: Secondary | ICD-10-CM | POA: Insufficient documentation

## 2013-01-20 DIAGNOSIS — K5900 Constipation, unspecified: Secondary | ICD-10-CM | POA: Insufficient documentation

## 2013-01-20 DIAGNOSIS — F79 Unspecified intellectual disabilities: Secondary | ICD-10-CM

## 2013-01-20 DIAGNOSIS — Z794 Long term (current) use of insulin: Secondary | ICD-10-CM | POA: Insufficient documentation

## 2013-01-20 DIAGNOSIS — H9190 Unspecified hearing loss, unspecified ear: Secondary | ICD-10-CM | POA: Insufficient documentation

## 2013-01-20 DIAGNOSIS — F952 Tourette's disorder: Secondary | ICD-10-CM | POA: Insufficient documentation

## 2013-01-20 DIAGNOSIS — E119 Type 2 diabetes mellitus without complications: Secondary | ICD-10-CM | POA: Insufficient documentation

## 2013-01-20 DIAGNOSIS — Z859 Personal history of malignant neoplasm, unspecified: Secondary | ICD-10-CM | POA: Insufficient documentation

## 2013-01-20 DIAGNOSIS — F911 Conduct disorder, childhood-onset type: Secondary | ICD-10-CM | POA: Insufficient documentation

## 2013-01-20 DIAGNOSIS — R4689 Other symptoms and signs involving appearance and behavior: Secondary | ICD-10-CM

## 2013-01-20 LAB — COMPREHENSIVE METABOLIC PANEL
ALT: 21 U/L (ref 0–53)
Albumin: 3.9 g/dL (ref 3.5–5.2)
Alkaline Phosphatase: 66 U/L (ref 39–117)
BUN: 12 mg/dL (ref 6–23)
CO2: 27 mEq/L (ref 19–32)
Chloride: 95 mEq/L — ABNORMAL LOW (ref 96–112)
GFR calc Af Amer: 58 mL/min — ABNORMAL LOW (ref >90)
GFR calc non Af Amer: 50 mL/min — ABNORMAL LOW (ref >90)
Glucose, Bld: 155 mg/dL — ABNORMAL HIGH (ref 70–99)
Potassium: 3.4 mEq/L — ABNORMAL LOW (ref 3.5–5.1)
Total Bilirubin: 0.2 mg/dL — ABNORMAL LOW (ref 0.3–1.2)

## 2013-01-20 LAB — ETHANOL: Alcohol, Ethyl (B): <11 mg/dL (ref 0–11)

## 2013-01-20 LAB — CBC
HCT: 44.9 % (ref 39.0–52.0)
Hemoglobin: 15.4 g/dL (ref 13.0–17.0)
MCV: 90.7 fL (ref 78.0–100.0)
RBC: 4.95 MIL/uL (ref 4.22–5.81)
WBC: 6.8 10*3/uL (ref 4.0–10.5)

## 2013-01-20 LAB — GLUCOSE, CAPILLARY: Glucose-Capillary: 148 mg/dL — ABNORMAL HIGH (ref 70–99)

## 2013-01-20 LAB — ACETAMINOPHEN LEVEL: Acetaminophen (Tylenol), Serum: <15 ug/mL (ref 10–30)

## 2013-01-20 NOTE — ED Notes (Signed)
Bed: WTR6 Expected date:  Expected time:  Means of arrival:  Comments: Closed 

## 2013-01-20 NOTE — ED Notes (Signed)
Pt under IVC with [employer], pt states SI with intent, stating "I am tired of my own self." Pt humming in triage, noted to be doing the same when taken to [facility].  Pt from group home.  Pt calm and cooperative in triage. Pt unable to answer questions about A/V/H.  Pt also states he has HI. Pt denies pain

## 2013-01-20 NOTE — ED Provider Notes (Signed)
CSN: [id number]     Arrival date & time 01/20/13  2030 History  This chart was scribed for non-physician practitioner, [name], PA-C,working with [name], MD, by [name], ED Scribe.  This patient was seen in room WTR6/WTR6 and the patient's care was started at 11:56 PM.  Chief Complaint  Patient presents with  . Medical Clearance   The history is provided by the patient. No language interpreter was used.   HPI Comments:  Shawn Baird is a 52 y.o. male with h/o DM, brought in by [employer] who presents to the Emergency Department for IVC. He states he was brought in because someone he lives with "gets on his nerves" and he "cannot stand him".  Pt reports suicidal ideations. He states his plan was to shoot himself in the head. Pt denies abdominal pain. Pt is pleasant during the exam.  Past Medical History  Diagnosis Date  . Diabetes mellitus   . Anxiety   . Hypertension   . Hyperlipemia   . Mental disorder   . Schizoaffective disorder   . Impulsive control disorder  . Pancreatitis   . Otitis media   . Cancer   . HOH (hard of hearing)   . Tourette's disease   . Mental retardation   . Chronic constipation    Past Surgical History  Procedure Laterality Date  . 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 High blood pressure Sister   . High Cholesterol Sister   . Heart disease Mother   . Stroke Mother    History  Substance Use Topics  . Smoking status: Never Smoker   . Smokeless tobacco: Never Used  . Alcohol Use: No    Review of Systems  Constitutional: Positive for fever.  Gastrointestinal: Negative for abdominal pain.  Psychiatric/Behavioral: Positive for suicidal ideas. Negative for self-injury.  All other systems reviewed and are negative.    Allergies  Review of patient's allergies indicates no known allergies.  Home Medications   Current Outpatient Rx  Name  Route  Sig  Dispense   Refill  . amLODipine (NORVASC) 5 MG tablet   Oral   Take 5 mg by mouth every morning.          Marland Kitchen aspirin EC 81 MG tablet   Oral   Take 81 mg by mouth every morning.          Marland Kitchen buPROPion (WELLBUTRIN XL) 300 MG 24 hr tablet   Oral   Take 300 mg by mouth every morning.         . cholecalciferol (VITAMIN D) 1000 UNITS tablet   Oral   Take 1,000 Units by mouth every morning.         . clonazePAM (KLONOPIN) 0.5 MG tablet   Oral   Take 0.5 mg by mouth every 12 (twelve) hours as needed (For agitation.).         Marland Kitchen dicyclomine (BENTYL) 20 MG tablet   Oral   Take 20 mg by mouth 4 (four) times daily.          . divalproex (DEPAKOTE ER) 500 MG 24 hr tablet   Oral   Take 500 mg by mouth 2 (two) times daily.         Marland Kitchen gabapentin (NEURONTIN) 300 MG capsule   Oral   Take 300 mg by mouth at bedtime.         Marland Kitchen  hydrOXYzine (ATARAX/VISTARIL) 25 MG tablet   Oral   Take 25 mg by mouth 4 (four) times daily as needed for anxiety.         . insulin regular (HUMULIN R) 100 units/mL injection   Subcutaneous   Inject 2-12 Units into the skin 4 (four) times daily -  before meals and at bedtime. He uses a sliding scale: 150-200 = 2 units, 201-250 = 4 units, 251-300 = 6 units, 301-350 = 8 units, 351-400 = 10 units or 401-450 = 12 units.         Marland Kitchen loratadine (CLARITIN) 10 MG tablet   Oral   Take 10 mg by mouth every morning.          . lubiprostone (AMITIZA) 24 MCG capsule   Oral   Take 24 mcg by mouth 2 (two) times daily.         . metFORMIN (GLUCOPHAGE-XR) 500 MG 24 hr tablet   Oral   Take 500 mg by mouth every morning.         . metoCLOPramide (REGLAN) 10 MG tablet   Oral   Take 10 mg by mouth 4 (four) times daily -  before meals and at bedtime.          . metoprolol tartrate (LOPRESSOR) 25 MG tablet   Oral   Take 12.5 mg by mouth 2 (two) times daily.         Marland Kitchen omeprazole (PRILOSEC) 20 MG capsule   Oral   Take 20 mg by mouth every morning.           . ondansetron (ZOFRAN) 4 MG tablet   Oral   Take 4 mg by mouth every 6 (six) hours as needed for nausea.         . polyethylene glycol (MIRALAX / GLYCOLAX) packet   Oral   Take 17 g by mouth every morning.          . pravastatin (PRAVACHOL) 20 MG tablet   Oral   Take 20 mg by mouth at bedtime.          Marland Kitchen QUEtiapine (SEROQUEL) 100 MG tablet   Oral   Take 100 mg by mouth 2 (two) times daily.         . QUEtiapine (SEROQUEL) 200 MG tablet   Oral   Take 600 mg by mouth at bedtime.         Marland Kitchen QUEtiapine (SEROQUEL) 50 MG tablet   Oral   Take 100 mg by mouth every 12 (twelve) hours as needed (For agitation.).         Marland Kitchen testosterone cypionate (DEPOTESTOTERONE CYPIONATE) 100 MG/ML injection   Intramuscular   Inject 100 mg into the muscle every 28 (twenty-eight) days. For IM use only         . traMADol (ULTRAM) 50 MG tablet   Oral   Take by mouth 3 (three) times daily as needed (For abdominal pain.).         Marland Kitchen vitamin B-12 (CYANOCOBALAMIN) 1000 MCG tablet   Oral   Take 1,000 mcg by mouth every morning.          . Vitamin D, Ergocalciferol, (DRISDOL) 50000 UNITS CAPS capsule   Oral   Take 50,000 Units by mouth 2 (two) times a week. He takes on Tuesdays and Thursdays.          Triage Vitals: BP 132/89  Pulse 89  Temp(Src) 99 F (37.2 C) (Oral)  Resp 16  SpO2  97% Physical Exam  Nursing note and vitals reviewed. Constitutional: He is oriented to person, place, and time. He appears well-developed and well-nourished.  HENT:  Head: Normocephalic and atraumatic.  Eyes: EOM are normal.  Neck: Normal range of motion.  Cardiovascular: Normal rate.   Pulmonary/Chest: Effort normal.  Musculoskeletal: Normal range of motion.  Neurological: He is alert and oriented to person, place, and time.  Skin: Skin is warm and dry.  Psychiatric: His affect is labile. His speech is rapid and/or pressured. Cognition and memory are impaired. He expresses impulsivity and  inappropriate judgment. He exhibits a depressed mood. He expresses homicidal and suicidal ideation. He expresses no suicidal plans and no homicidal plans. He is inattentive.    ED Course  Procedures (including critical care time) DIAGNOSTIC STUDIES: Oxygen Saturation is 97% on RA, normal by my interpretation.   COORDINATION OF CARE: 12:04 AM- Will obtain routine blood work for medical clearance. Pt verbalizes understanding and agrees to plan.  Medications - No data to display  Labs Review Labs Reviewed  COMPREHENSIVE METABOLIC PANEL - Abnormal; Notable for the following:    Sodium 134 (*)    Potassium 3.4 (*)    Chloride 95 (*)    Glucose, Bld 155 (*)    Creatinine, Ser 1.53 (*)    Total Bilirubin 0.2 (*)    GFR calc non Af Amer 50 (*)    GFR calc Af Amer 58 (*)    All other components within normal limits  SALICYLATE LEVEL - Abnormal; Notable for the following:    Salicylate Lvl <2.0 (*)    All other components within normal limits  GLUCOSE, CAPILLARY - Abnormal; Notable for the following:    Glucose-Capillary 148 (*)    All other components within normal limits  ACETAMINOPHEN LEVEL  CBC  ETHANOL  URINE RAPID DRUG SCREEN (HOSP PERFORMED)   Imaging Review No results found.  EKG Interpretation   None       MDM  Homicidal ideation Suicidal ideation Depression Mental Retardation  Patient here from group home stating that if he had a gun he would shoot himself in the head with it or shoot his room-mate whom he "hates his guts".  He makes good eye contact, is under IVC papers and has [employer] escort.  I personally performed the services described in this documentation, which was scribed in my presence. The recorded information has been reviewed and is accurate.    Izola Price Marisue Humble, PA-C 01/22/13 0131

## 2013-01-21 DIAGNOSIS — F200 Paranoid schizophrenia: Secondary | ICD-10-CM

## 2013-01-21 DIAGNOSIS — F79 Unspecified intellectual disabilities: Secondary | ICD-10-CM

## 2013-01-21 LAB — RAPID URINE DRUG SCREEN, HOSP PERFORMED
Amphetamines: NOT DETECTED
Barbiturates: NOT DETECTED
Benzodiazepines: NOT DETECTED

## 2013-01-21 LAB — HEMOGLOBIN A1C
Hgb A1c MFr Bld: 6.1 % — ABNORMAL HIGH (ref <5.7)
Mean Plasma Glucose: 128 mg/dL — ABNORMAL HIGH (ref <117)

## 2013-01-21 MED ORDER — CLONAZEPAM 0.5 MG PO TABS: 0.5000 mg | ORAL_TABLET | Freq: Two times a day (BID) | ORAL | Status: DC | PRN

## 2013-01-21 MED ORDER — AMLODIPINE BESYLATE 5 MG PO TABS
5.0000 mg | ORAL_TABLET | Freq: Every morning | ORAL | Status: DC
  Administered 2013-01-21: 5 mg via ORAL
  Filled 2013-01-21 (×2): qty 1

## 2013-01-21 MED ORDER — TESTOSTERONE CYPIONATE 100 MG/ML IM SOLN: 100.0000 mg | INTRAMUSCULAR | Status: DC

## 2013-01-21 MED ORDER — POLYETHYLENE GLYCOL 3350 17 G PO PACK
17.0000 g | PACK | Freq: Every morning | ORAL | Status: DC
  Administered 2013-01-21: 17 g via ORAL
  Filled 2013-01-21 (×2): qty 1

## 2013-01-21 MED ORDER — IBUPROFEN 200 MG PO TABS: 600.0000 mg | ORAL_TABLET | Freq: Three times a day (TID) | ORAL | Status: DC | PRN

## 2013-01-21 MED ORDER — QUETIAPINE FUMARATE 100 MG PO TABS
100.0000 mg | ORAL_TABLET | Freq: Two times a day (BID) | ORAL | Status: DC
  Administered 2013-01-21 (×2): 100 mg via ORAL
  Filled 2013-01-21 (×2): qty 1

## 2013-01-21 MED ORDER — ONDANSETRON HCL 4 MG PO TABS: 4.0000 mg | ORAL_TABLET | Freq: Four times a day (QID) | ORAL | Status: DC | PRN

## 2013-01-21 MED ORDER — ACETAMINOPHEN 325 MG PO TABS: 650.0000 mg | ORAL_TABLET | ORAL | Status: DC | PRN

## 2013-01-21 MED ORDER — QUETIAPINE FUMARATE 300 MG PO TABS
600.0000 mg | ORAL_TABLET | Freq: Every day | ORAL | Status: DC
Start: 2013-01-21 — End: 2013-01-21
  Administered 2013-01-21: 600 mg via ORAL
  Filled 2013-01-21: qty 2

## 2013-01-21 MED ORDER — DICYCLOMINE HCL 20 MG PO TABS
20.0000 mg | ORAL_TABLET | Freq: Four times a day (QID) | ORAL | Status: DC
  Administered 2013-01-21 (×2): 20 mg via ORAL
  Filled 2013-01-21 (×2): qty 1

## 2013-01-21 MED ORDER — METOPROLOL TARTRATE 25 MG PO TABS
12.5000 mg | ORAL_TABLET | Freq: Two times a day (BID) | ORAL | Status: DC
  Administered 2013-01-21 (×2): 12.5 mg via ORAL
  Filled 2013-01-21 (×2): qty 1

## 2013-01-21 MED ORDER — VITAMIN D3 25 MCG (1000 UNIT) PO TABS
1000.0000 [IU] | ORAL_TABLET | Freq: Every morning | ORAL | Status: DC
  Administered 2013-01-21: 1000 [IU] via ORAL
  Filled 2013-01-21 (×2): qty 1

## 2013-01-21 MED ORDER — INSULIN ASPART 100 UNIT/ML ~~LOC~~ SOLN
4.0000 [IU] | Freq: Three times a day (TID) | SUBCUTANEOUS | Status: DC
  Administered 2013-01-21: 4 [IU] via SUBCUTANEOUS
  Filled 2013-01-21 (×2): qty 1

## 2013-01-21 MED ORDER — VITAMIN B-12 1000 MCG PO TABS
1000.0000 ug | ORAL_TABLET | Freq: Every morning | ORAL | Status: DC
  Administered 2013-01-21: 1000 ug via ORAL
  Filled 2013-01-21 (×2): qty 1

## 2013-01-21 MED ORDER — BUPROPION HCL ER (XL) 300 MG PO TB24
300.0000 mg | ORAL_TABLET | Freq: Every morning | ORAL | Status: DC
  Administered 2013-01-21: 300 mg via ORAL
  Filled 2013-01-21 (×2): qty 1

## 2013-01-21 MED ORDER — QUETIAPINE FUMARATE 100 MG PO TABS
100.0000 mg | ORAL_TABLET | Freq: Two times a day (BID) | ORAL | Status: DC | PRN
Start: 2013-01-21 — End: 2013-01-21

## 2013-01-21 MED ORDER — DIVALPROEX SODIUM ER 500 MG PO TB24
500.0000 mg | ORAL_TABLET | Freq: Two times a day (BID) | ORAL | Status: DC
  Administered 2013-01-21: 500 mg via ORAL
  Filled 2013-01-21 (×3): qty 1

## 2013-01-21 MED ORDER — VITAMIN D (ERGOCALCIFEROL) 1.25 MG (50000 UNIT) PO CAPS: 50000.0000 [IU] | ORAL_CAPSULE | ORAL | Status: DC

## 2013-01-21 MED ORDER — HYDROXYZINE HCL 25 MG PO TABS: 25.0000 mg | ORAL_TABLET | Freq: Four times a day (QID) | ORAL | Status: DC | PRN

## 2013-01-21 MED ORDER — ONDANSETRON HCL 4 MG PO TABS: 4.0000 mg | ORAL_TABLET | Freq: Three times a day (TID) | ORAL | Status: DC | PRN

## 2013-01-21 MED ORDER — PANTOPRAZOLE SODIUM 40 MG PO TBEC
40.0000 mg | DELAYED_RELEASE_TABLET | Freq: Every day | ORAL | Status: DC
  Administered 2013-01-21: 40 mg via ORAL
  Filled 2013-01-21: qty 1

## 2013-01-21 MED ORDER — INSULIN ASPART 100 UNIT/ML ~~LOC~~ SOLN: 0.0000 [IU] | Freq: Every day | SUBCUTANEOUS | Status: DC

## 2013-01-21 MED ORDER — METFORMIN HCL ER 500 MG PO TB24: 500.0000 mg | ORAL_TABLET | Freq: Every morning | ORAL | Status: DC

## 2013-01-21 MED ORDER — LUBIPROSTONE 24 MCG PO CAPS
24.0000 ug | ORAL_CAPSULE | Freq: Two times a day (BID) | ORAL | Status: DC
  Administered 2013-01-21: 24 ug via ORAL
  Filled 2013-01-21 (×3): qty 1

## 2013-01-21 MED ORDER — GABAPENTIN 300 MG PO CAPS
300.0000 mg | ORAL_CAPSULE | Freq: Every day | ORAL | Status: DC
  Administered 2013-01-21: 300 mg via ORAL
  Filled 2013-01-21 (×2): qty 1

## 2013-01-21 MED ORDER — ASPIRIN EC 81 MG PO TBEC
81.0000 mg | DELAYED_RELEASE_TABLET | Freq: Every morning | ORAL | Status: DC
  Administered 2013-01-21: 81 mg via ORAL
  Filled 2013-01-21 (×2): qty 1

## 2013-01-21 MED ORDER — TRAMADOL HCL 50 MG PO TABS: 50.0000 mg | ORAL_TABLET | Freq: Three times a day (TID) | ORAL | Status: DC | PRN

## 2013-01-21 MED ORDER — METOCLOPRAMIDE HCL 10 MG PO TABS
10.0000 mg | ORAL_TABLET | Freq: Three times a day (TID) | ORAL | Status: DC
  Administered 2013-01-21 (×2): 10 mg via ORAL
  Filled 2013-01-21 (×2): qty 1

## 2013-01-21 MED ORDER — LORATADINE 10 MG PO TABS
10.0000 mg | ORAL_TABLET | Freq: Every morning | ORAL | Status: DC
Start: 2013-01-21 — End: 2013-01-21
  Administered 2013-01-21: 10 mg via ORAL
  Filled 2013-01-21 (×3): qty 1

## 2013-01-21 MED ORDER — SIMVASTATIN 20 MG PO TABS
20.0000 mg | ORAL_TABLET | Freq: Every day | ORAL | Status: DC
  Filled 2013-01-21: qty 1

## 2013-01-21 MED ORDER — INSULIN ASPART 100 UNIT/ML ~~LOC~~ SOLN
0.0000 [IU] | Freq: Three times a day (TID) | SUBCUTANEOUS | Status: DC
  Administered 2013-01-21: 2 [IU] via SUBCUTANEOUS
  Filled 2013-01-21: qty 1

## 2013-01-21 NOTE — ED Notes (Signed)
D/C instructions reviewed with caretaker. Belongings bag x1 returned. Ambulatory without difficulty. Denies pain. No complaints voiced. Escorted by mental health tech to front of hospital.

## 2013-01-21 NOTE — BH Assessment (Signed)
Tele Assessment Note   Shawn Baird is an 52 y.o. male, single, African-American who was brought to [facility] ED by Patent examiner after being petitioned for IVC by group home staff. Pt has a history of schizophrenia and mental retardation and currently resides at [facility] [phone]. Pt has a hearing loss, does not have his hearing aid and has difficulty hearing questions unless they are spoken clearly and loudly. Pt reports he is in the ED because the other residents "get on my nerves." When asked which specific residents are bothering him he states "all of them." Pt repeatedly denies suicidal ideation, saying "I don't want to kill myself." He denies any history of suicide attempts. He denies homicidal ideation or wanting to hurt people at the group home, stating "I don't want to hurt nobody." He cannot describe the events that lead him to be taken to the emergency room. He cannot identify any stressors other than conflicts with other residents. He does reports that his mother will get on his nerves but according to Pt records his mother is deceased. Pt denies auditory or visual hallucinations. He denies alcohol or substance abuse.  Contacted [name] at [facility]. Mr Willeen Cass reports Pt received diet sodas from his sister for Christmas and Pt's roommate also received diet sodas from someone. Pt saw roommate drinking a diet soda and was convinced it belonged to Pt. Pt became very upset and was cursing, yelling and verbally threatening to roommate and staff. Per [name], Pt said he would kill himself but never said a plan. He also was not physically aggressive but he refused to calm down, take his medications or have his blood sugar tested. Mr Willeen Cass reports he secretly put medication in Pt's soda but it did not help calm the Pt so he petitioned for IVC.  Pt is slightly disheveled, alert, oriented to person, place and situation but not date or day of the  week. His eye contact was good. His hearing is impaired and sometimes questions required repeating before he could understand what was being asked. His mood is anxious and affect is appropriate to situation. Pt's thought process is circumstantial. His insight and judgment are limited. Pt was calm and cooperative throughout assessment.   Axis I: Schizophrenia Axis II: Mental retardation, severity unknown Axis III:  Past Medical History  Diagnosis Date  . Diabetes mellitus   . Anxiety   . Hypertension   . Hyperlipemia   . Mental disorder   . Schizoaffective disorder   . Impulsive control disorder  . Pancreatitis   . Otitis media   . Cancer   . HOH (hard of hearing)   . Tourette's disease   . Mental retardation   . Chronic constipation    Axis IV: other psychosocial or environmental problems and problems with primary support group Axis V: GAF=40  Past Medical History:  Past Medical History  Diagnosis Date  . Diabetes mellitus   . Anxiety   . Hypertension   . Hyperlipemia   . Mental disorder   . Schizoaffective disorder   . Impulsive control disorder  . Pancreatitis   . Otitis media   . Cancer   . HOH (hard of hearing)   . Tourette's disease   . Mental retardation   . Chronic constipation     Past Surgical History  Procedure Laterality Date  . 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 High blood pressure Sister   . High Cholesterol Sister   . Heart disease Mother   . Stroke Mother     Social History:  reports that he has never smoked. He has never used smokeless tobacco. He reports that he does not drink alcohol or use illicit drugs.  Additional Social History:  Alcohol / Drug Use Pain Medications: Denies abuse Prescriptions: Denies abuse Over the Counter: Denies abuse History of alcohol / drug use?: No history of alcohol / drug abuse Longest period of sobriety (when/how  long): NA  CIWA: CIWA-Ar BP: 136/98 mmHg Pulse Rate: 93 COWS:    Allergies: No Known Allergies  Home Medications:  (Not in a hospital admission)  OB/GYN Status:  No LMP for male patient.  General Assessment Data Location of Assessment: [facility] ED Is this a Tele or Face-to-Face Assessment?: Tele Assessment Is this an Initial Assessment or a Re-assessment for this encounter?: Initial Assessment Living Arrangements: Other (Comment) [facility]) Can pt return to current living arrangement?: Yes Admission Status: Involuntary Is patient capable of signing voluntary admission?: No Transfer from: Group Home Referral Source: Other (Group home staff)     [facility] Crisis Care Plan Living Arrangements: Other (Comment) [facility]) Name of Psychiatrist: Dr. Dolores Frame (Dr. Dolores Frame) Name of Therapist: N/A  Education Status Is patient currently in school?: No Current Grade: NA Highest grade of school patient has completed: NA Name of school: NA Contact person: NA  Risk to self Suicidal Ideation: No Suicidal Intent: No Is patient at risk for suicide?: Yes (Group home staff report Pt made suicidal statements) Suicidal Plan?: No Specify Current Suicidal Plan: None Access to Means: No What has been your use of drugs/alcohol within the last 12 months?: Pt denies Previous Attempts/Gestures: No How many times?: 0 Other Self Harm Risks: None Triggers for Past Attempts: None known Intentional Self Injurious Behavior: None Family Suicide History: Unknown Recent stressful life event(s): Conflict (Comment) (Conflict with roommate) Persecutory voices/beliefs?: No Depression: No Depression Symptoms: Feeling angry/irritable Substance abuse history and/or treatment for substance abuse?: No Suicide prevention information given to non-admitted patients: Not applicable  Risk to Others Homicidal Ideation: No Thoughts of Harm to Others: Yes-Currently  Present Comment - Thoughts of Harm to Others: Pt made verbal threats to harm roommate Current Homicidal Intent: No Current Homicidal Plan: No Access to Homicidal Means: No Identified Victim: Roommate History of harm to others?: No Assessment of Violence: None Noted Violent Behavior Description: Pt has a history of banging doors, destroying propert Does patient have access to weapons?: No Criminal Charges Pending?: No Does patient have a court date: No  Psychosis Hallucinations: None noted Delusions: None noted  Mental Status Report Appear/Hygiene:  (Appears slightly disheveled) Eye Contact: Good Motor Activity: Unremarkable Speech: Slow Level of Consciousness: Alert Mood: Anxious Affect: Anxious Anxiety Level: Minimal Thought Processes: Circumstantial Judgement: Impaired Orientation: Person;Place;Other (Comment);Situation (Not oriented to date, day of week.) Obsessive Compulsive Thoughts/Behaviors: None  Cognitive Functioning Concentration: Decreased Memory: Recent Impaired;Remote Impaired IQ: Below Average Level of Function: MR: IQ unknown Insight: Poor Impulse Control: Poor Appetite: Good Weight Loss: 0 Weight Gain: 0 Sleep: No Change Total Hours of Sleep: 8 (Unable to assess) Vegetative Symptoms: None  ADLScreening [facility] Assessment Services) Patient's cognitive ability adequate to safely complete daily activities?: Yes Patient able to express need for assistance with ADLs?: Yes Independently performs ADLs?: No  Prior Inpatient Therapy Prior  Inpatient Therapy: Yes Prior Therapy Dates: 03/2012 Prior Therapy Facilty/Provider(s): [facility] Reason for Treatment: Schizophrenia  Prior Outpatient Therapy Prior Outpatient Therapy: Yes Prior Therapy Dates: Current Prior Therapy Facilty/Provider(s): Dolores Frame, MD Reason for Treatment: Schizophrenia  ADL Screening (condition at time of admission) Patient's cognitive ability adequate to safely complete  daily activities?: Yes Is the patient deaf or have difficulty hearing?: Yes Does the patient have difficulty seeing, even when wearing glasses/contacts?: No Does the patient have difficulty concentrating, remembering, or making decisions?: Yes Patient able to express need for assistance with ADLs?: Yes Does the patient have difficulty dressing or bathing?: Yes Independently performs ADLs?: No Communication: Needs assistance Is this a change from baseline?: Pre-admission baseline Dressing (OT): Needs assistance Is this a change from baseline?: Pre-admission baseline Grooming: Needs assistance Is this a change from baseline?: Pre-admission baseline Feeding: Independent Bathing: Needs assistance Is this a change from baseline?: Pre-admission baseline Toileting: Independent In/Out Bed: Independent Walks in Home: Independent Does the patient have difficulty walking or climbing stairs?: No Weakness of Legs: None Weakness of Arms/Hands: None  Home Assistive Devices/Equipment Home Assistive Devices/Equipment: None    Abuse/Neglect Assessment (Assessment to be complete while patient is alone) Physical Abuse: Denies Verbal Abuse: Denies Sexual Abuse: Denies Exploitation of patient/patient's resources: Denies Self-Neglect: Denies Values / Beliefs Cultural Requests During Hospitalization: None Spiritual Requests During Hospitalization: None   Advance Directives (For Healthcare) Advance Directive:  (Pt unable to answer question.) Nutrition Screen- [facility] Patient's home diet: Regular  Additional Information 1:1 In Past 12 Months?: Yes CIRT Risk: Yes Elopement Risk: Yes Does patient have medical clearance?: Yes     Disposition:  Disposition Initial Assessment Completed for this Encounter: Yes Disposition of Patient: Other dispositions Other disposition(s): Other (Comment) (Pt to be seen by psychiatrist in AM for evaluation of IVC)  Consulted with Alberteen Sam, NP who  agrees that Pt should be evaluated by psychiatrist in the morning. If Pt agrees to take medications and CBGs in ED, continues to remain calm and cooperative and continues to deny suicidal or homicidal ideation, Alberteen Sam agrees that psychiatrist might consider release from IVC and discharged back to [facility].  Pamalee Leyden, [facility], [facility] Triage Specialist    Patsy Baltimore, Harlin Rain 01/21/2013 5:08 AM

## 2013-01-21 NOTE — Progress Notes (Signed)
CSW called group home ([facility], [phone])  to inform of d/c and arrange transit.  CSW spoke with Janus Molder. Janus Molder to transport pt by 1:30pm back to group home.  [place] Sp[place], [phone]     ED CSW  12:04pm

## 2013-01-21 NOTE — ED Notes (Signed)
TTS assessment with Ford in progress at present. 

## 2013-01-21 NOTE — Progress Notes (Signed)
PHARMACIST - PHYSICIAN COMMUNICATION DR:  Dierdre Highman CONCERNING:  METFORMIN SAFE ADMINISTRATION POLICY  RECOMMENDATION: Metformin has been placed on DISCONTINUE (rejected order) STATUS and should be reordered only after any of the conditions below are ruled out.  Current safety recommendations include avoiding metformin for a minimum of 48 hours after the patient's exposure to intravenous contrast media.  DESCRIPTION:  The Pharmacy Committee has adopted a policy that restricts the use of metformin in hospitalized patients until all the contraindications to administration have been ruled out. Specific contraindications are: [x]  Serum creatinine ? 1.5 for males []  Serum creatinine ? 1.4 for females []  Shock, acute MI, sepsis, hypoxemia, dehydration []  Planned administration of intravenous iodinated contrast media []  Heart Failure patients with low EF []  Acute or chronic metabolic acidosis (including DKA)

## 2013-01-21 NOTE — ED Provider Notes (Signed)
I was not directly involved in this patient's care. Psychiatry note on the chart. IVC has been rescinded and patient is to be discharged to group home. Note reviewed.  Shon Baton, MD 01/21/13 1250

## 2013-01-21 NOTE — BH Assessment (Signed)
Received call for tele-assessment. Spoke with Dr. Dierdre Highman who said he had limited information and to go ahead and assess the Pt. Tele-assessment will be initiated.  Harlin Rain Ria Comment, [facility] Triage Specialist

## 2013-01-21 NOTE — ED Notes (Signed)
Attempted assessment with TTS Ford, pt does not have hearing aid, states he left hearing aid at home.  Questions repeated with some understanding.

## 2013-01-21 NOTE — BH Assessment (Signed)
Assessment complete. Consulted with Shawn Sam, NP who agrees that Pt should be evaluated by psychiatrist in the morning. If Pt agrees to take medications and CBGs in ED, continues to remain calm and cooperative and continues to deny suicidal or homicidal ideation, Shawn Baird agrees that psychiatrist might consider release from IVC and discharged back to [facility].  Harlin Rain Ria Comment, [facility] Triage Specialist

## 2013-01-21 NOTE — BH Assessment (Signed)
Reason for Consult: behavioral problems/agitation Referring Physician: ER physician  Shawn Baird is an 52 y.o. male.  HPI: Shawn Baird is an 52 y.o. male, single, African-American who was brought to [facility] ED by Patent examiner after being petitioned for IVC by group home staff. Pt has a history of schizophrenia and mental retardation and currently resides at [facility] [phone]. He has complained that the other residents "get on his nerves." When asked which specific residents are bothering him he states "all of them."  He repeatedly denies suicidal ideation, saying "I don't want to kill myself." He has no history of suicide attempts. He denies homicidal ideation or wanting to hurt people at the group home, stating "I don't want to hurt nobody." He cannot identify any stressors other than conflicts with other residents. He denies auditory or visual hallucinations. He denies alcohol or substance abuse. Contacted [name] at [facility]. Mr Willeen Cass reports he became very upset and was cursing, yelling and verbally threatening to roommate and staff.    MSE: He is calm and cooperative. He has normal psychomotor activity. He has good mood with bright affect. He has normal speech and thought process. He denied SI/HI and no evidence of psychosis.   Past Medical History  Diagnosis Date  . Diabetes mellitus   . Anxiety   . Hypertension   . Hyperlipemia   . Mental disorder   . Schizoaffective disorder   . Impulsive control disorder  . Pancreatitis   . Otitis media   . Cancer   . HOH (hard of hearing)   . Tourette's disease   . Mental retardation   . Chronic constipation     Past Surgical History  Procedure Laterality Date  . 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 High blood pressure Sister   . High Cholesterol Sister   . Heart disease Mother   . Stroke Mother     Social  History:  reports that he has never smoked. He has never used smokeless tobacco. He reports that he does not drink alcohol or use illicit drugs.  Allergies: No Known Allergies  Medications: I have reviewed the patient's current medications.  Results for orders placed during the hospital encounter of 01/20/13 (from the past 48 hour(s))  GLUCOSE, CAPILLARY     Status: Abnormal   Collection Time    01/20/13 10:55 PM      Result Value Range   Glucose-Capillary 148 (*) 70 - 99 mg/dL   Comment 1 Notify RN    ACETAMINOPHEN LEVEL     Status: None   Collection Time    01/20/13 10:58 PM      Result Value Range   Acetaminophen (Tylenol), Serum <15.0  10 - 30 ug/mL   Comment:            THERAPEUTIC CONCENTRATIONS VARY     SIGNIFICANTLY. A RANGE OF 10-30     ug/mL MAY BE AN EFFECTIVE     CONCENTRATION FOR MANY PATIENTS.     HOWEVER, SOME ARE BEST TREATED     AT CONCENTRATIONS OUTSIDE THIS     RANGE.     ACETAMINOPHEN CONCENTRATIONS     >150 ug/mL AT 4 HOURS AFTER     INGESTION AND >50 ug/mL AT 12     HOURS AFTER INGESTION ARE     OFTEN ASSOCIATED  WITH TOXIC     REACTIONS.  CBC     Status: None   Collection Time    01/20/13 10:58 PM      Result Value Range   WBC 6.8  4.0 - 10.5 K/uL   RBC 4.95  4.22 - 5.81 MIL/uL   Hemoglobin 15.4  13.0 - 17.0 g/dL   HCT 16.1  09.6 - 04.5 %   MCV 90.7  78.0 - 100.0 fL   MCH 31.1  26.0 - 34.0 pg   MCHC 34.3  30.0 - 36.0 g/dL   RDW 40.9  81.1 - 91.4 %   Platelets 235  150 - 400 K/uL  COMPREHENSIVE METABOLIC PANEL     Status: Abnormal   Collection Time    01/20/13 10:58 PM      Result Value Range   Sodium 134 (*) 135 - 145 mEq/L   Potassium 3.4 (*) 3.5 - 5.1 mEq/L   Chloride 95 (*) 96 - 112 mEq/L   CO2 27  19 - 32 mEq/L   Glucose, Bld 155 (*) 70 - 99 mg/dL   BUN 12  6 - 23 mg/dL   Creatinine, Ser 7.82 (*) 0.50 - 1.35 mg/dL   Calcium 9.4  8.4 - 95.6 mg/dL   Total Protein 7.5  6.0 - 8.3 g/dL   Albumin 3.9  3.5 - 5.2 g/dL   AST 30  0 - 37 U/L    Comment: SLIGHT HEMOLYSIS     HEMOLYSIS AT THIS LEVEL MAY AFFECT RESULT   ALT 21  0 - 53 U/L   Alkaline Phosphatase 66  39 - 117 U/L   Total Bilirubin 0.2 (*) 0.3 - 1.2 mg/dL   GFR calc non Af Amer 50 (*) >90 mL/min   GFR calc Af Amer 58 (*) >90 mL/min   Comment: (NOTE)     The eGFR has been calculated using the CKD EPI equation.     This calculation has not been validated in all clinical situations.     eGFR's persistently <90 mL/min signify possible Chronic Kidney     Disease.  ETHANOL     Status: None   Collection Time    01/20/13 10:58 PM      Result Value Range   Alcohol, Ethyl (B) <11  0 - 11 mg/dL   Comment:            LOWEST DETECTABLE LIMIT FOR     SERUM ALCOHOL IS 11 mg/dL     FOR MEDICAL PURPOSES ONLY  SALICYLATE LEVEL     Status: Abnormal   Collection Time    01/20/13 10:58 PM      Result Value Range   Salicylate Lvl <2.0 (*) 2.8 - 20.0 mg/dL  VALPROIC ACID LEVEL     Status: Abnormal   Collection Time    01/20/13 10:58 PM      Result Value Range   Valproic Acid Lvl 46.2 (*) 50.0 - 100.0 ug/mL   Comment: Performed at [facility]  URINE RAPID DRUG SCREEN (HOSP PERFORMED)     Status: None   Collection Time    01/21/13  2:53 AM      Result Value Range   Opiates NONE DETECTED  NONE DETECTED   Cocaine NONE DETECTED  NONE DETECTED   Benzodiazepines NONE DETECTED  NONE DETECTED   Amphetamines NONE DETECTED  NONE DETECTED   Tetrahydrocannabinol NONE DETECTED  NONE DETECTED   Barbiturates NONE DETECTED  NONE DETECTED   Comment:  DRUG SCREEN FOR MEDICAL PURPOSES     ONLY.  IF CONFIRMATION IS NEEDED     FOR ANY PURPOSE, NOTIFY LAB     WITHIN 5 DAYS.                LOWEST DETECTABLE LIMITS     FOR URINE DRUG SCREEN     Drug Class       Cutoff (ng/mL)     Amphetamine      1000     Barbiturate      200     Benzodiazepine   200     Tricyclics       300     Opiates          300     Cocaine          300     THC              50  GLUCOSE,  CAPILLARY     Status: Abnormal   Collection Time    01/21/13  3:13 AM      Result Value Range   Glucose-Capillary 112 (*) 70 - 99 mg/dL   Comment 1 Notify RN    GLUCOSE, CAPILLARY     Status: Abnormal   Collection Time    01/21/13  8:40 AM      Result Value Range   Glucose-Capillary 125 (*) 70 - 99 mg/dL    No results found.  Positive for behavior problems Blood pressure 136/98, pulse 93, temperature 99 F (37.2 C), temperature source Oral, resp. rate 16, SpO2 98.00%.   Assessment/Plan: Paranoid schizophrenia Mental retardation, unknown  Recommendation: Patient does not meet criteria for acute psychiatric hospitalization Rescind IVC and discharge to [facility] family group home Refer to out patient psychiatric services No medication changes required  ,[name] 01/21/2013, 12:04 PM

## 2013-01-21 NOTE — ED Notes (Signed)
Pt transferred from triage, presents for medical clearance.  Pt is IVCed by caregiver.  Pt very poor historian.  Pt reports he is SI, but will not elaborate further.  IVC papers report pt is hearing voices and physically aggressive toward staff and his roommate.  Pt calm & cooperative at present, humming to self.

## 2013-01-22 NOTE — ED Provider Notes (Signed)
Medical screening examination/treatment/procedure(s) were performed by non-physician practitioner and as supervising physician I was immediately available for consultation/collaboration.     , MD 01/22/13 0135 

## 2013-05-07 IMAGING — CR DG THORACIC SPINE 2V
3 series · 3 of 3 positions shown · non-contrast
Comparison: 01/01/2012

CLINICAL DATA: Back pain

THORACIC SPINE - 2 VIEW

[t thoracic spine ap]
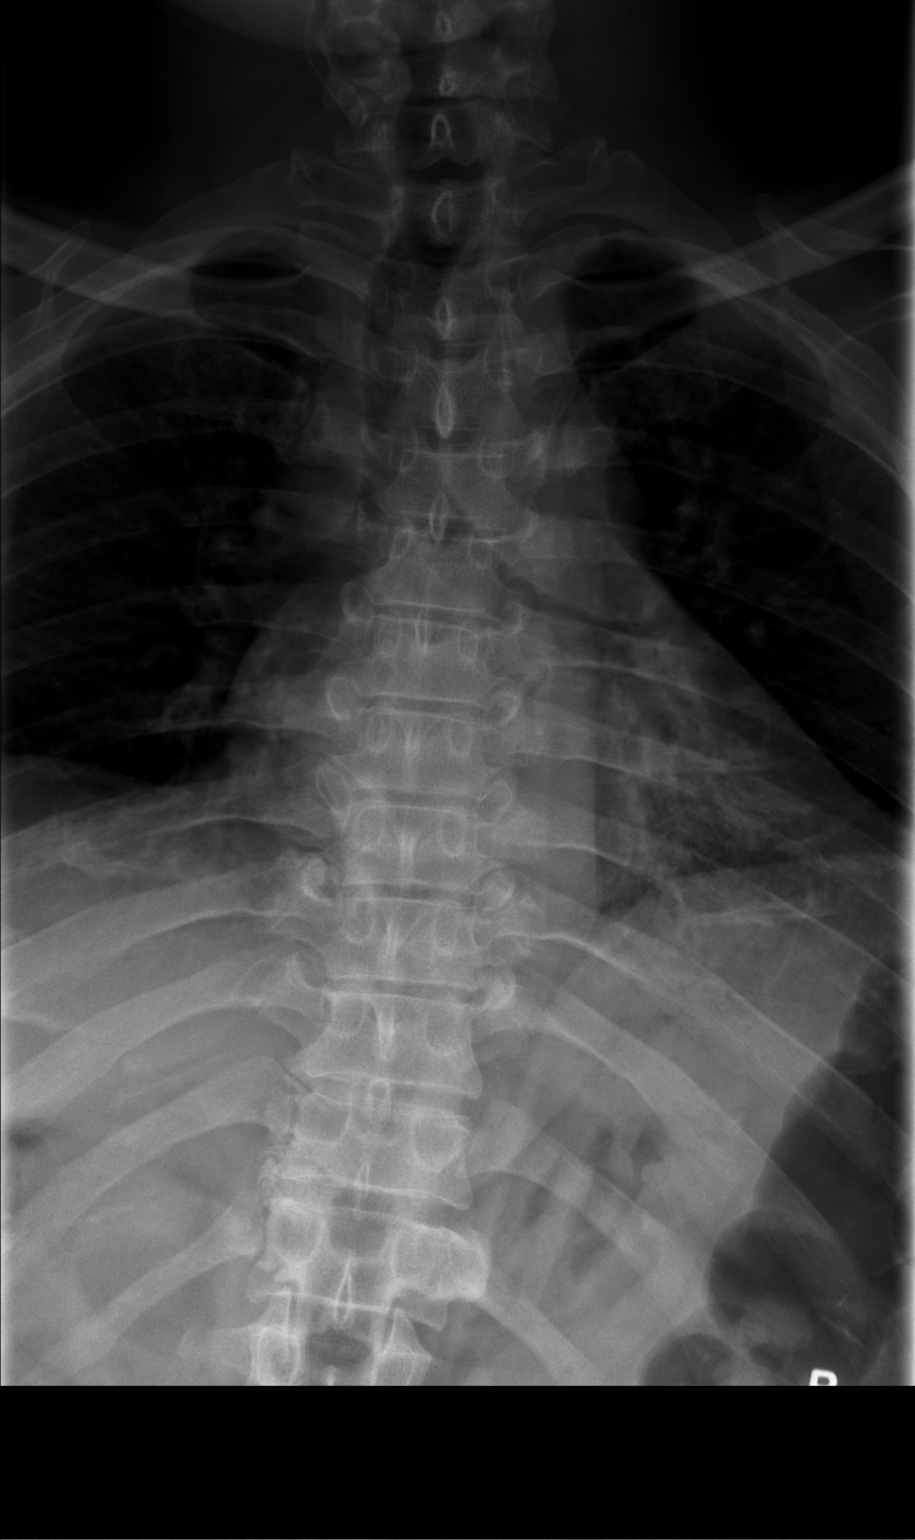

[t thoracic spine lat]
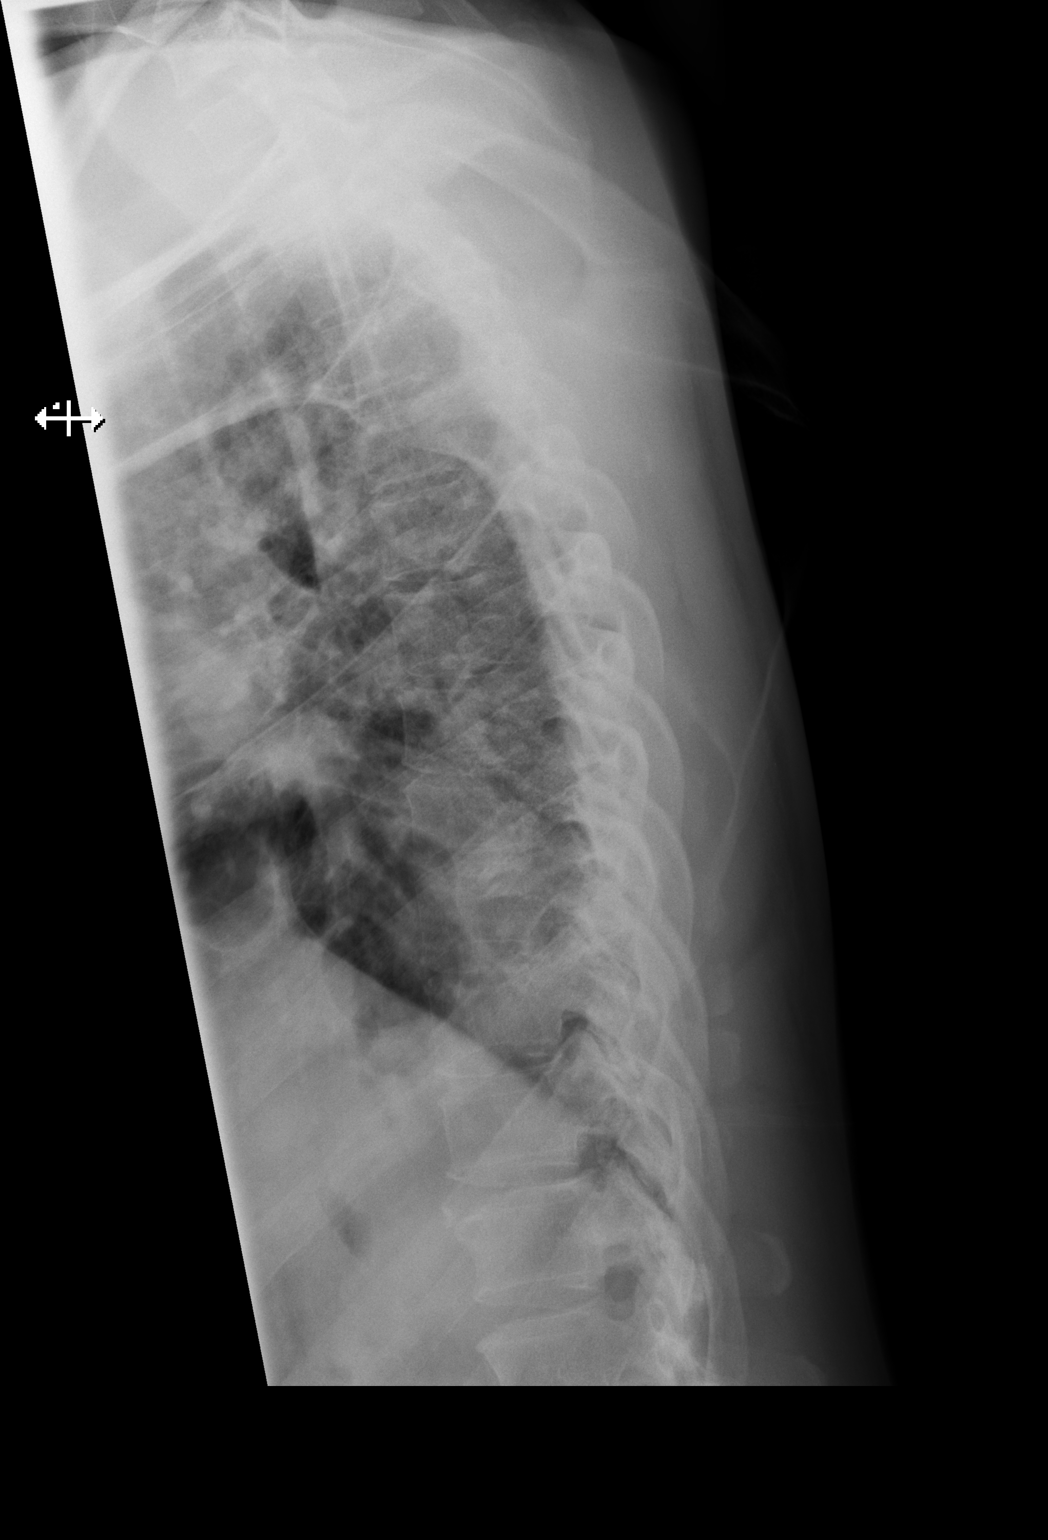

[t thoracic swimmers]
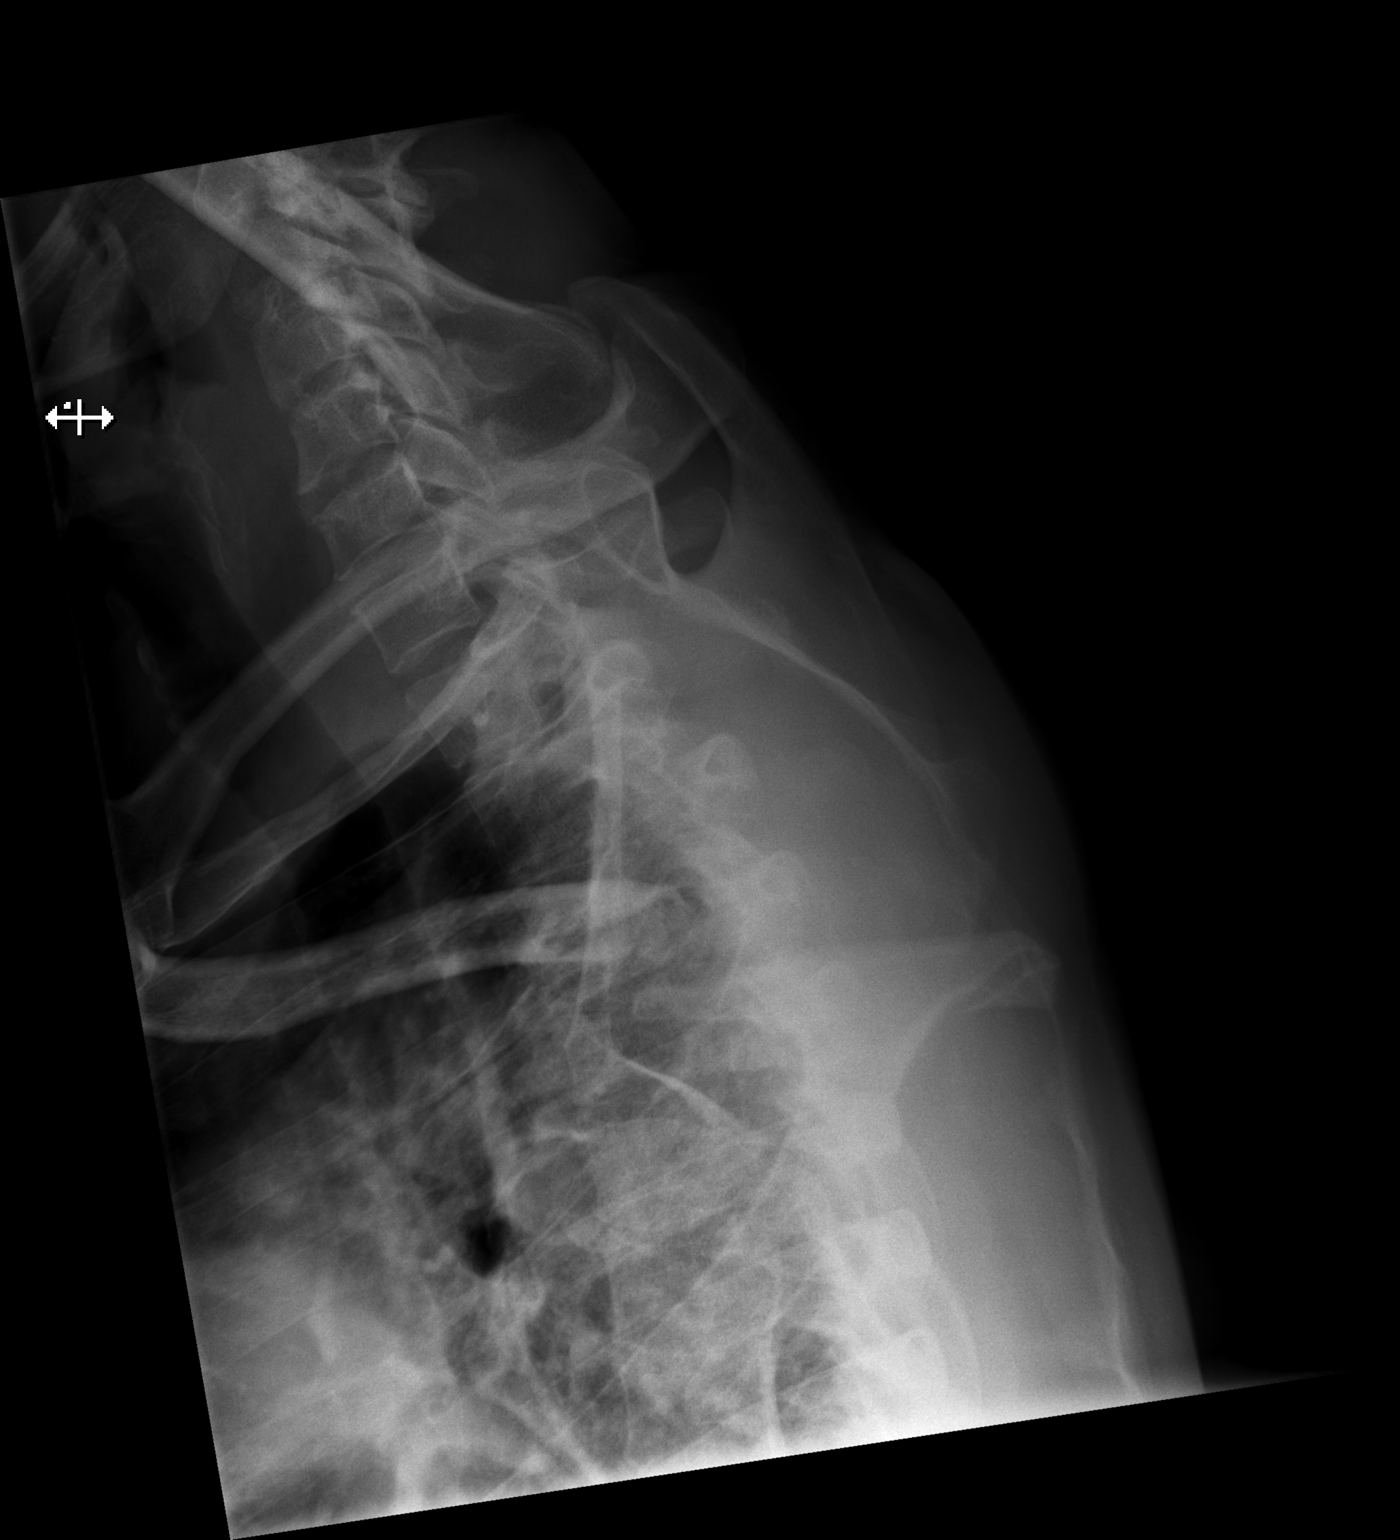

[3 of 3 positions shown; findings below may reference images not displayed]

FINDINGS: No vertebral compression deformity.  Anatomic alignment.
Mild degenerative change throughout the thoracic spine.  Disc
height is relatively maintained.
IMPRESSION: No acute bony injury.

## 2013-05-07 IMAGING — CR DG HIP W/ PELVIS BILAT
5 series · 5 of 5 positions shown · non-contrast
Comparison: None.

CLINICAL DATA: Back pain, limping.

BILATERAL HIP WITH PELVIS - 4+ VIEW

[t pelvis ap]
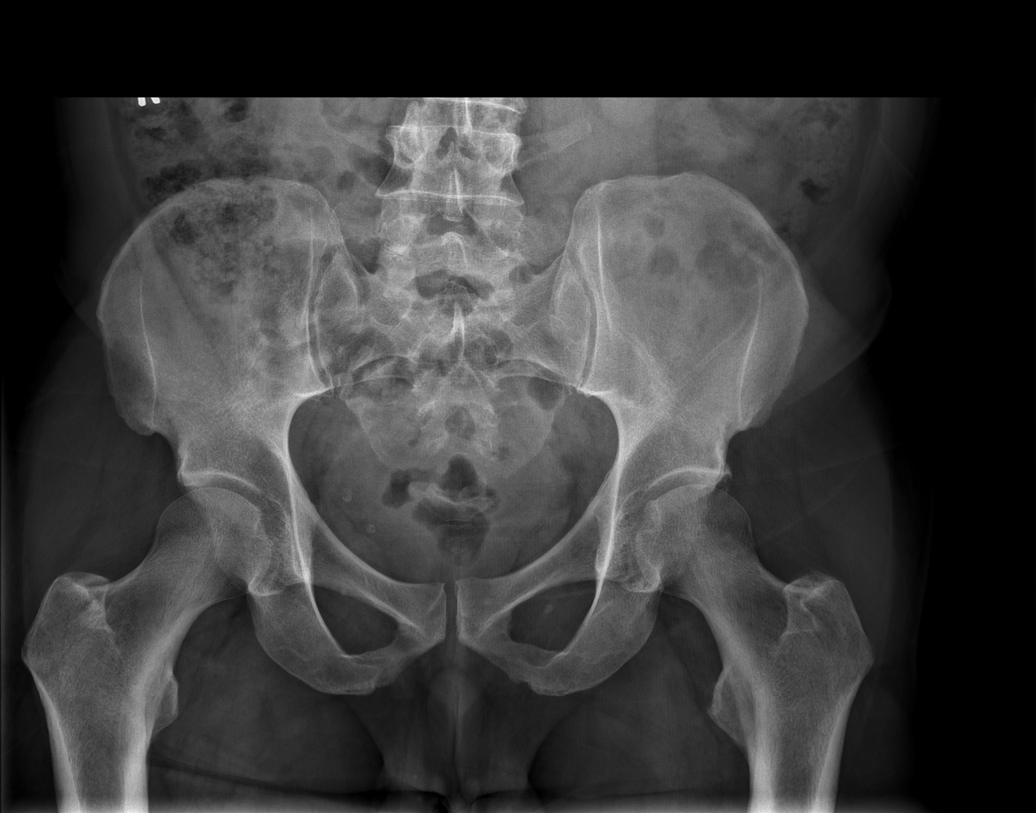

[t hip ap right]
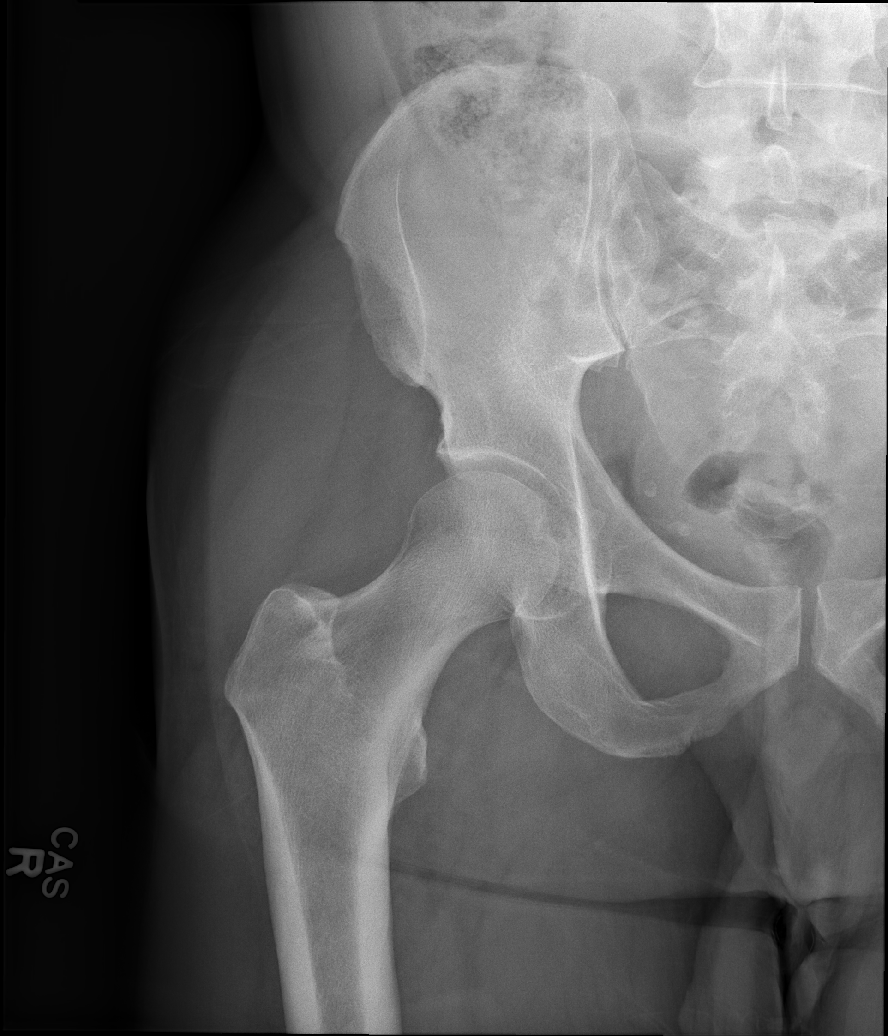

[t hip ap left]
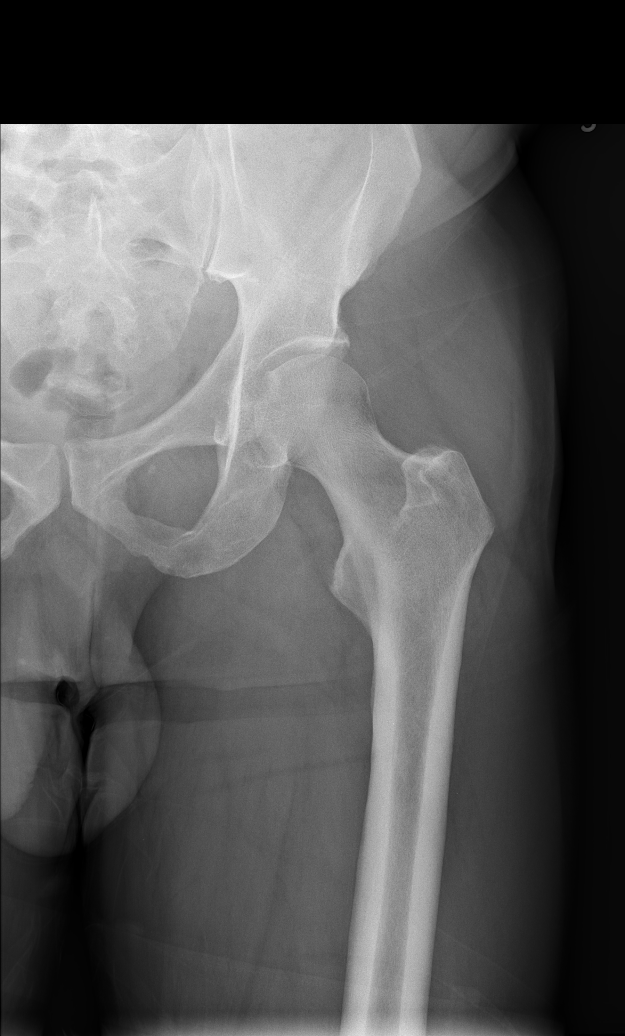

[t hip frog leg right]
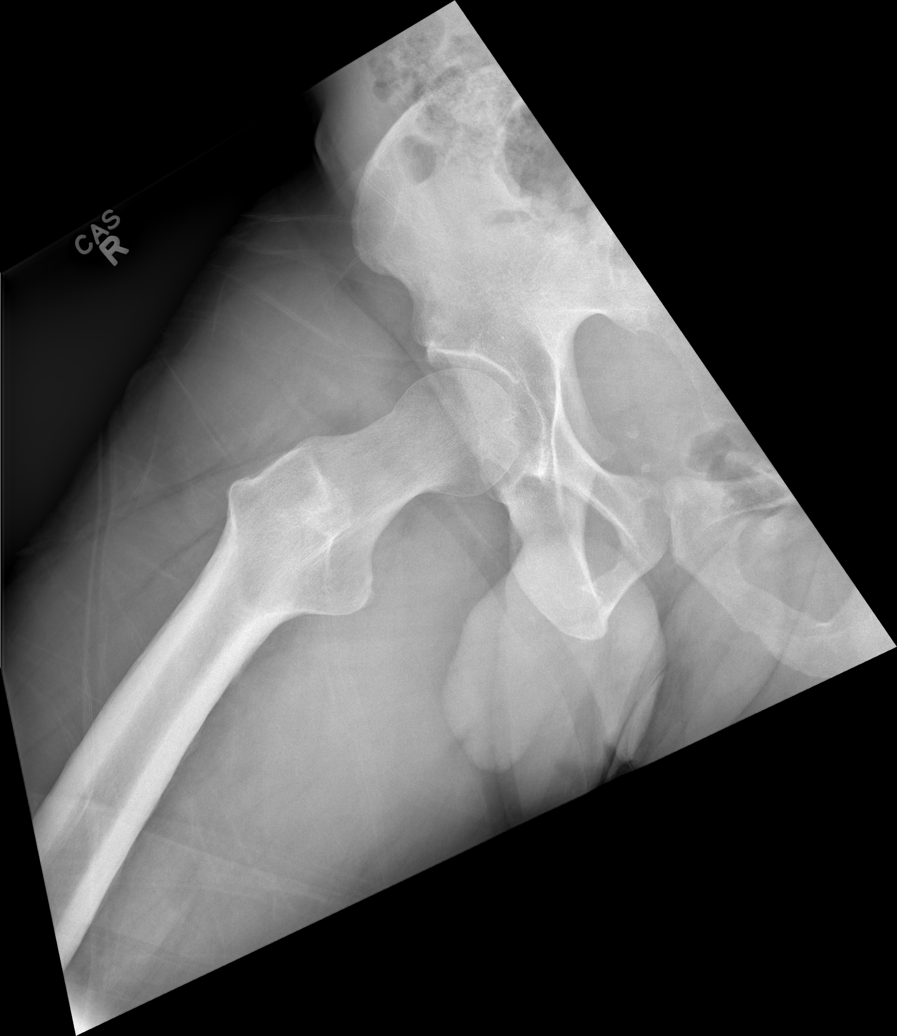

[t hip frog leg left]
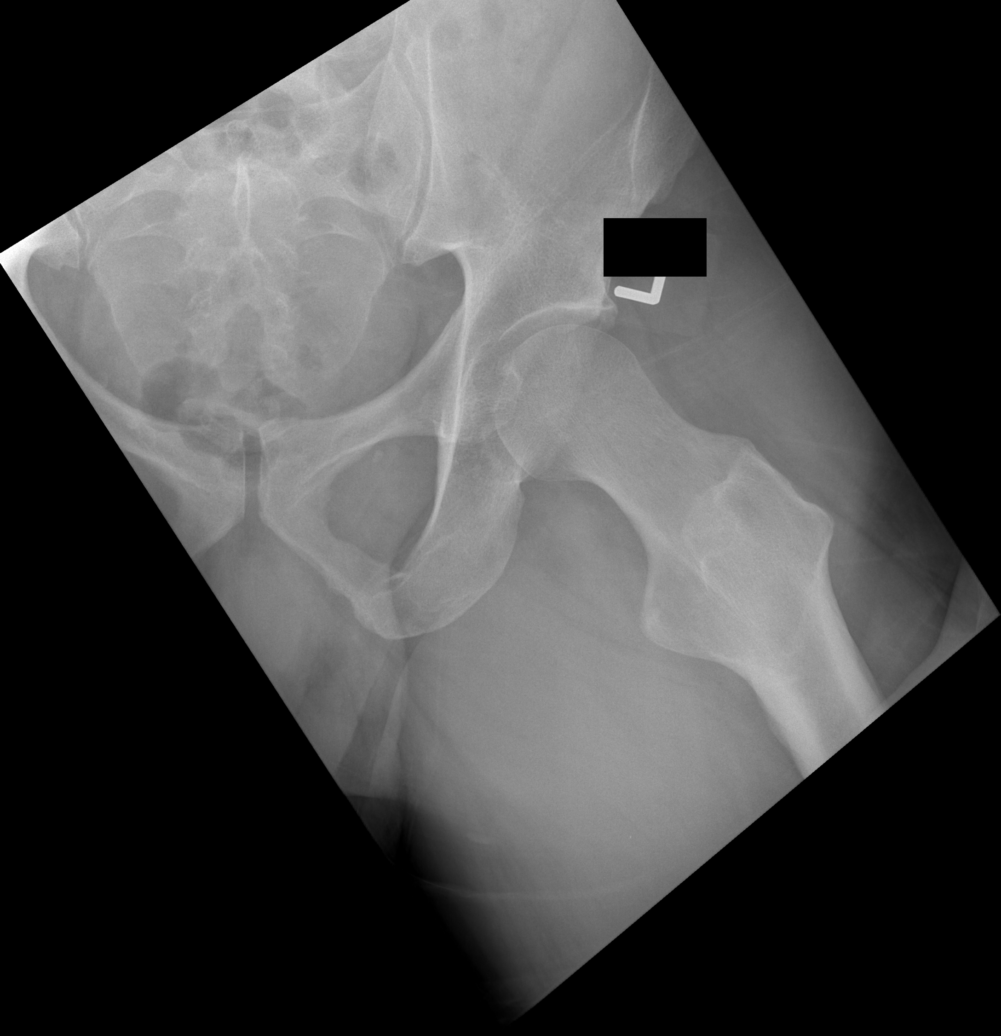

[5 of 5 positions shown; findings below may reference images not displayed]

FINDINGS: No acute bony abnormality.  Specifically, no fracture,
subluxation, or dislocation.  Soft tissues are intact.  SI joints
and hip joints are symmetric and unremarkable.
IMPRESSION: No acute bony abnormality.

## 2013-10-04 ENCOUNTER — Encounter (HOSPITAL_COMMUNITY): Payer: Self-pay | Admitting: Emergency Medicine

## 2013-10-04 ENCOUNTER — Emergency Department (HOSPITAL_COMMUNITY)
Admission: EM | Admit: 2013-10-04 | Discharge: 2013-10-05 | Disposition: A | Discharge status: Home / Self Care | Payer: Medicare Other | Attending: Emergency Medicine | Admitting: Emergency Medicine

## 2013-10-04 DIAGNOSIS — I10 Essential (primary) hypertension: Secondary | ICD-10-CM | POA: Insufficient documentation

## 2013-10-04 DIAGNOSIS — Z79899 Other long term (current) drug therapy: Secondary | ICD-10-CM | POA: Diagnosis not present

## 2013-10-04 DIAGNOSIS — F79 Unspecified intellectual disabilities: Secondary | ICD-10-CM | POA: Diagnosis not present

## 2013-10-04 DIAGNOSIS — R7989 Other specified abnormal findings of blood chemistry: Secondary | ICD-10-CM | POA: Diagnosis not present

## 2013-10-04 DIAGNOSIS — E876 Hypokalemia: Secondary | ICD-10-CM | POA: Insufficient documentation

## 2013-10-04 DIAGNOSIS — F911 Conduct disorder, childhood-onset type: Secondary | ICD-10-CM | POA: Diagnosis present

## 2013-10-04 DIAGNOSIS — E785 Hyperlipidemia, unspecified: Secondary | ICD-10-CM | POA: Diagnosis not present

## 2013-10-04 DIAGNOSIS — H9190 Unspecified hearing loss, unspecified ear: Secondary | ICD-10-CM | POA: Insufficient documentation

## 2013-10-04 DIAGNOSIS — F4324 Adjustment disorder with disturbance of conduct: Secondary | ICD-10-CM

## 2013-10-04 DIAGNOSIS — F259 Schizoaffective disorder, unspecified: Secondary | ICD-10-CM | POA: Insufficient documentation

## 2013-10-04 DIAGNOSIS — IMO0002 Reserved for concepts with insufficient information to code with codable children: Secondary | ICD-10-CM | POA: Insufficient documentation

## 2013-10-04 DIAGNOSIS — E119 Type 2 diabetes mellitus without complications: Secondary | ICD-10-CM | POA: Insufficient documentation

## 2013-10-04 DIAGNOSIS — R45851 Suicidal ideations: Secondary | ICD-10-CM | POA: Diagnosis not present

## 2013-10-04 DIAGNOSIS — Z8719 Personal history of other diseases of the digestive system: Secondary | ICD-10-CM | POA: Diagnosis not present

## 2013-10-04 DIAGNOSIS — Z859 Personal history of malignant neoplasm, unspecified: Secondary | ICD-10-CM | POA: Insufficient documentation

## 2013-10-04 DIAGNOSIS — R45850 Homicidal ideations: Secondary | ICD-10-CM | POA: Diagnosis not present

## 2013-10-04 DIAGNOSIS — Z7982 Long term (current) use of aspirin: Secondary | ICD-10-CM | POA: Diagnosis not present

## 2013-10-04 DIAGNOSIS — R4689 Other symptoms and signs involving appearance and behavior: Secondary | ICD-10-CM

## 2013-10-04 DIAGNOSIS — K5900 Constipation, unspecified: Secondary | ICD-10-CM | POA: Diagnosis not present

## 2013-10-04 DIAGNOSIS — F411 Generalized anxiety disorder: Secondary | ICD-10-CM | POA: Diagnosis not present

## 2013-10-04 LAB — SALICYLATE LEVEL: Salicylate Lvl: <2 mg/dL — ABNORMAL LOW (ref 2.8–20.0)

## 2013-10-04 LAB — CBC
HCT: 42.5 % (ref 39.0–52.0)
Hemoglobin: 14.7 g/dL (ref 13.0–17.0)
MCH: 30.9 pg (ref 26.0–34.0)
MCHC: 34.6 g/dL (ref 30.0–36.0)
MCV: 89.3 fL (ref 78.0–100.0)
PLATELETS: 315 10*3/uL (ref 150–400)
RBC: 4.76 MIL/uL (ref 4.22–5.81)
RDW: 13.3 % (ref 11.5–15.5)
WBC: 6.6 10*3/uL (ref 4.0–10.5)

## 2013-10-04 LAB — RAPID URINE DRUG SCREEN, HOSP PERFORMED
AMPHETAMINES: NOT DETECTED
BARBITURATES: NOT DETECTED
Benzodiazepines: NOT DETECTED
COCAINE: NOT DETECTED
Opiates: NOT DETECTED
Tetrahydrocannabinol: NOT DETECTED

## 2013-10-04 LAB — COMPREHENSIVE METABOLIC PANEL
ALBUMIN: 4.7 g/dL (ref 3.5–5.2)
ALK PHOS: 86 U/L (ref 39–117)
ALT: 25 U/L (ref 0–53)
AST: 29 U/L (ref 0–37)
Anion gap: 18 — ABNORMAL HIGH (ref 5–15)
BILIRUBIN TOTAL: 0.6 mg/dL (ref 0.3–1.2)
BUN: 25 mg/dL — ABNORMAL HIGH (ref 6–23)
CHLORIDE: 99 meq/L (ref 96–112)
CO2: 21 meq/L (ref 19–32)
Calcium: 10.9 mg/dL — ABNORMAL HIGH (ref 8.4–10.5)
Creatinine, Ser: 2.56 mg/dL — ABNORMAL HIGH (ref 0.50–1.35)
GFR calc Af Amer: 31 mL/min — ABNORMAL LOW (ref >90)
GFR, EST NON AFRICAN AMERICAN: 27 mL/min — AB (ref >90)
Glucose, Bld: 160 mg/dL — ABNORMAL HIGH (ref 70–99)
POTASSIUM: 3.6 meq/L — AB (ref 3.7–5.3)
SODIUM: 138 meq/L (ref 137–147)
Total Protein: 9 g/dL — ABNORMAL HIGH (ref 6.0–8.3)

## 2013-10-04 LAB — ACETAMINOPHEN LEVEL

## 2013-10-04 LAB — ETHANOL: Alcohol, Ethyl (B): <11 mg/dL (ref 0–11)

## 2013-10-04 MED ORDER — AMLODIPINE BESYLATE 5 MG PO TABS
5.0000 mg | ORAL_TABLET | Freq: Every morning | ORAL | Status: DC
Start: 2013-10-05 — End: 2013-10-05
  Administered 2013-10-05: 5 mg via ORAL
  Filled 2013-10-04: qty 1

## 2013-10-04 MED ORDER — PANTOPRAZOLE SODIUM 40 MG PO TBEC
40.0000 mg | DELAYED_RELEASE_TABLET | Freq: Every day | ORAL | Status: DC
  Administered 2013-10-04 – 2013-10-05 (×2): 40 mg via ORAL
  Filled 2013-10-04 (×2): qty 1

## 2013-10-04 MED ORDER — FENOFIBRATE 54 MG PO TABS
54.0000 mg | ORAL_TABLET | Freq: Every day | ORAL | Status: DC
  Administered 2013-10-05: 54 mg via ORAL
  Filled 2013-10-04: qty 1

## 2013-10-04 MED ORDER — ZOLPIDEM TARTRATE 5 MG PO TABS: 5.0000 mg | ORAL_TABLET | Freq: Every evening | ORAL | Status: DC | PRN

## 2013-10-04 MED ORDER — IBUPROFEN 200 MG PO TABS: 600.0000 mg | ORAL_TABLET | Freq: Three times a day (TID) | ORAL | Status: DC | PRN

## 2013-10-04 MED ORDER — QUETIAPINE FUMARATE 100 MG PO TABS
100.0000 mg | ORAL_TABLET | Freq: Two times a day (BID) | ORAL | Status: DC
  Administered 2013-10-04 – 2013-10-05 (×2): 100 mg via ORAL
  Filled 2013-10-04 (×2): qty 1

## 2013-10-04 MED ORDER — LUBIPROSTONE 24 MCG PO CAPS
24.0000 ug | ORAL_CAPSULE | Freq: Two times a day (BID) | ORAL | Status: DC
  Administered 2013-10-04 – 2013-10-05 (×2): 24 ug via ORAL
  Filled 2013-10-04 (×3): qty 1

## 2013-10-04 MED ORDER — ASPIRIN EC 81 MG PO TBEC
81.0000 mg | DELAYED_RELEASE_TABLET | Freq: Every morning | ORAL | Status: DC
  Administered 2013-10-05: 81 mg via ORAL
  Filled 2013-10-04: qty 1

## 2013-10-04 MED ORDER — HYDROXYZINE HCL 25 MG PO TABS: 25.0000 mg | ORAL_TABLET | Freq: Four times a day (QID) | ORAL | Status: DC | PRN

## 2013-10-04 MED ORDER — METOPROLOL TARTRATE 25 MG PO TABS
12.5000 mg | ORAL_TABLET | Freq: Two times a day (BID) | ORAL | Status: DC
  Administered 2013-10-04 – 2013-10-05 (×2): 12.5 mg via ORAL
  Filled 2013-10-04 (×2): qty 1

## 2013-10-04 MED ORDER — QUETIAPINE FUMARATE 100 MG PO TABS: 100.0000 mg | ORAL_TABLET | Freq: Two times a day (BID) | ORAL | Status: DC | PRN

## 2013-10-04 MED ORDER — ONDANSETRON HCL 4 MG PO TABS
4.0000 mg | ORAL_TABLET | Freq: Three times a day (TID) | ORAL | Status: DC | PRN
  Administered 2013-10-04: 4 mg via ORAL
  Filled 2013-10-04: qty 1

## 2013-10-04 MED ORDER — BUPROPION HCL ER (XL) 300 MG PO TB24
300.0000 mg | ORAL_TABLET | Freq: Every morning | ORAL | Status: DC
Start: 2013-10-05 — End: 2013-10-05
  Administered 2013-10-05: 300 mg via ORAL
  Filled 2013-10-04: qty 1

## 2013-10-04 MED ORDER — STERILE WATER FOR INJECTION IJ SOLN
INTRAMUSCULAR | Status: AC
  Administered 2013-10-04: 1.5 mL
  Filled 2013-10-04: qty 10

## 2013-10-04 MED ORDER — NICOTINE 21 MG/24HR TD PT24
21.0000 mg | MEDICATED_PATCH | Freq: Every day | TRANSDERMAL | Status: DC
  Filled 2013-10-04: qty 1

## 2013-10-04 MED ORDER — ZIPRASIDONE MESYLATE 20 MG IM SOLR
20.0000 mg | Freq: Once | INTRAMUSCULAR | Status: AC | PRN
  Administered 2013-10-04: 20 mg via INTRAMUSCULAR
  Filled 2013-10-04: qty 20

## 2013-10-04 MED ORDER — VITAMIN D (ERGOCALCIFEROL) 1.25 MG (50000 UNIT) PO CAPS
50000.0000 [IU] | ORAL_CAPSULE | ORAL | Status: DC
  Administered 2013-10-05: 50000 [IU] via ORAL
  Filled 2013-10-04: qty 1

## 2013-10-04 MED ORDER — GABAPENTIN 300 MG PO CAPS
300.0000 mg | ORAL_CAPSULE | Freq: Every day | ORAL | Status: DC
  Administered 2013-10-04: 300 mg via ORAL
  Filled 2013-10-04: qty 1

## 2013-10-04 MED ORDER — GLIPIZIDE ER 2.5 MG PO TB24
2.5000 mg | ORAL_TABLET | Freq: Every day | ORAL | Status: DC
  Administered 2013-10-05: 2.5 mg via ORAL
  Filled 2013-10-04 (×2): qty 1

## 2013-10-04 MED ORDER — QUETIAPINE FUMARATE 300 MG PO TABS
600.0000 mg | ORAL_TABLET | Freq: Every day | ORAL | Status: DC
Start: 2013-10-04 — End: 2013-10-05
  Administered 2013-10-04: 600 mg via ORAL
  Filled 2013-10-04: qty 2

## 2013-10-04 MED ORDER — LORAZEPAM 2 MG/ML IJ SOLN
1.0000 mg | Freq: Once | INTRAMUSCULAR | Status: AC | PRN
  Administered 2013-10-04: 1 mg via INTRAMUSCULAR
  Filled 2013-10-04: qty 1

## 2013-10-04 MED ORDER — DIVALPROEX SODIUM ER 500 MG PO TB24
500.0000 mg | ORAL_TABLET | Freq: Two times a day (BID) | ORAL | Status: DC
  Administered 2013-10-04 – 2013-10-05 (×2): 500 mg via ORAL
  Filled 2013-10-04 (×3): qty 1

## 2013-10-04 NOTE — ED Notes (Signed)
Went to collect labs -  was advised by PA to hold off until he got some meds to calm him down.

## 2013-10-04 NOTE — BH Assessment (Signed)
Assessment Note  Shawn Baird is an 53 y.o. male. Patient was brought into the ED by [employer] under IVC initiated by group home staff because of aggression, threats to kill self and others.  Patient is dx Schizophrenia and mental retardation.  Patient is a poor historian.  Patient reports that his group home staff person was getting on his nerves earlier today. "Mr. Margaretmary Dys getting on my nerves".  Patient reports that the staff cursed him out and this caused him to get upset.  Patient admits that he been refusing his medications but is willing to take them today.   CSW called to speak with Mr. Margaretmary Dys the group home staff member to get collateral information.  He reports that the patient became erratic starting yesterday after the exterminator moved some furniture around to complete the bug treatment and the patient noticed the changes.  It was reported that the patient normally yell or have some verbal outburst but not to the extent of need police involvement. It was noticed today that the patient had not been swallowing his medications as they witnessed some pills on the floor or behind some furniture.  CSW spoke with Mr. Claiborne Billings group home owner to collect collateral information.  He reports that the patient is refusing to eat and threatening to leave the home.  Patient had not recently damaged any property but has in the past year.  He reports that the patient can return back to the facility once he is medically cleared.    CSW ran patient with Manus Gunning, NP it is recommended for inpatient hospitalization for safety and stabilization.  Patient is declined at [facility] therefore needing placement at other outside facilities.     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 Diabetes mellitus   . Anxiety   . Hypertension   . Hyperlipemia   . Mental disorder   . Schizoaffective disorder   . Impulsive control disorder  . Pancreatitis    . Otitis media   . Cancer   . HOH (hard of hearing)   . Tourette's disease   . Mental retardation   . Chronic constipation    Axis IV: educational problems, other psychosocial or environmental problems, problems related to social environment and problems with primary support group Axis V: 31-40 impairment in reality testing  Past Medical History:  Past Medical History  Diagnosis Date  . Diabetes mellitus   . Anxiety   . Hypertension   . Hyperlipemia   . Mental disorder   . Schizoaffective disorder   . Impulsive control disorder  . Pancreatitis   . Otitis media   . Cancer   . HOH (hard of hearing)   . Tourette's disease   . Mental retardation   . Chronic constipation     Past Surgical History  Procedure Laterality Date  . 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 High blood pressure Sister   . High Cholesterol Sister   . Heart disease Mother   . Stroke Mother     Social History:  reports that he has never smoked. He has never used smokeless tobacco. He reports that he does not drink alcohol or use illicit drugs.  Additional Social History:     CIWA: CIWA-Ar BP: 135/92 mmHg Pulse Rate: 119 COWS:  Allergies: No Known Allergies  Home Medications:  (Not in a hospital admission)  OB/GYN Status:  No LMP for male patient.  General Assessment Data Location of Assessment: [facility] ED ACT Assessment: Yes Is this a Tele or Face-to-Face Assessment?: Face-to-Face Is this an Initial Assessment or a Re-assessment for this encounter?: Initial Assessment Living Arrangements: Other (Comment) (Group Home) Can pt return to current living arrangement?: Yes Admission Status: Involuntary Is patient capable of signing voluntary admission?: No Transfer from: Group Home Referral Source: Self/Family/Friend  Medical Screening Exam ([place]) Medical Exam completed: Yes  [facility] Living Arrangements: Other (Comment) (Group Home) Name of Psychiatrist: [facility]  Education Status Is patient currently in school?: No  Risk to self with the past 6 months Suicidal Ideation: No-Not Currently/Within Last 6 Months Suicidal Intent: No-Not Currently/Within Last 6 Months Is patient at risk for suicide?: No Suicidal Plan?: No-Not Currently/Within Last 6 Months Access to Means: No What has been your use of drugs/alcohol within the last 12 months?: none Previous Attempts/Gestures: Yes Triggers for Past Attempts: Unknown;Hallucinations Intentional Self Injurious Behavior: None Family Suicide History: Unknown Recent stressful life event(s): Other (Comment) (non-compliant with medication, ) Persecutory voices/beliefs?: No Depression: No Substance abuse history and/or treatment for substance abuse?: No  Risk to Others within the past 6 months Homicidal Ideation: No-Not Currently/Within Last 6 Months Thoughts of Harm to Others: No-Not Currently Present/Within Last 6 Months Current Homicidal Intent: No-Not Currently/Within Last 6 Months Current Homicidal Plan: No-Not Currently/Within Last 6 Months Access to Homicidal Means: No History of harm to others?: Yes Assessment of Violence: On admission (Verbal aggression towards Police and staff) Violent Behavior Description: verbal threats to Police and staff Does patient have access to weapons?: No Criminal Charges Pending?: No Does patient have a court date: No  Psychosis Hallucinations: Auditory Delusions: Unspecified  Mental Status Report Appear/Hygiene: In hospital gown Eye Contact: Fair Motor Activity: Freedom of movement Speech: Pressured;Loud Level of Consciousness: Alert Mood: Labile Affect: Anxious Anxiety Level: Minimal Thought Processes: Circumstantial Judgement: Impaired Orientation: Person;Place;Situation Obsessive Compulsive Thoughts/Behaviors: None  Cognitive Functioning Concentration:  Fair Memory: Remote Intact;Recent Intact IQ: Average Insight: Fair Impulse Control: Poor Appetite: Fair Sleep: No Change Vegetative Symptoms: None  ADLScreening [facility] Assessment Services) Patient's cognitive ability adequate to safely complete daily activities?: Yes Patient able to express need for assistance with ADLs?: Yes Independently performs ADLs?: Yes (appropriate for developmental age)  Prior Inpatient Therapy Prior Inpatient Therapy: Yes Prior Therapy Dates: unknown Prior Therapy Facilty/Provider(s): unknown Reason for Treatment: aggression  Prior Outpatient Therapy Prior Outpatient Therapy: Yes Prior Therapy Facilty/Provider(s): [facility] Reason for Treatment: schizophrenia  ADL Screening (condition at time of admission) Patient's cognitive ability adequate to safely complete daily activities?: Yes Patient able to express need for assistance with ADLs?: Yes Independently performs ADLs?: Yes (appropriate for developmental age)         Values / Beliefs Cultural Requests During Hospitalization: None (Unable to assess) Spiritual Requests During Hospitalization: None (Unable to assess)   Advance Directives (For Healthcare) Does patient have an advance directive?: No Would patient like information on creating an advanced directive?: No - patient declined information    Additional Information 1:1 In Past 12 Months?: No CIRT Risk: No Elopement Risk: No Does patient have medical clearance?: Yes     Disposition:  Disposition Initial Assessment Completed for this Encounter: Yes Disposition of Patient: Inpatient treatment program Type of inpatient treatment program: Adult  On Site Evaluation by:   Reviewed with Physician:  Chesley Noon A 10/04/2013 10:19 PM

## 2013-10-04 NOTE — ED Provider Notes (Signed)
CSN: [id number]     Arrival date & time 10/04/13  1357 History  This chart was scribed for non-physician practitioner, Starlyn Skeans, PA-C,working with Ephraim Hamburger, MD, by Marlowe Kays, ED Scribe. This patient was seen in room WTR3/WLPT3 and the patient's care was started at 2:29 PM.  Chief Complaint  Patient presents with  . Suicidal  . Homicidal  . Aggressive Behavior   The history is provided by the patient and the police. No language interpreter was used.   LEVEL 5 CAVEAT- Full history could not be obtained due to mental disorder.  HPI Comments:  Xerxes Agrusa is a 53 y.o. male with PMH of DM, HTN, mental disorder,schizoaffective disorder and cancer brought in by [employer], who presents to the Emergency Department secondary to being involuntarily committed by the group home in which he resides. Police report that pt is mentally challenged and was threatening people at the group home in which he lives. Officers report that pt threatened them by him stating he was going to shoot them in the forehead. Pt keeps screaming obscenities and threatening the police and the provider during exam.   Past Medical History  Diagnosis Date  . Diabetes mellitus   . Anxiety   . Hypertension   . Hyperlipemia   . Mental disorder   . Schizoaffective disorder   . Impulsive control disorder  . Pancreatitis   . Otitis media   . Cancer   . HOH (hard of hearing)   . Tourette's disease   . Mental retardation   . Chronic constipation    Past Surgical History  Procedure Laterality Date  . 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 High blood pressure Sister   . High Cholesterol Sister   . Heart disease Mother   . Stroke Mother    History  Substance Use Topics  . Smoking status: Never Smoker   . Smokeless tobacco: Never Used  . Alcohol Use: No    Review of Systems  Psychiatric/Behavioral: Positive for suicidal ideas and  agitation.  All other systems reviewed and are negative.  LEVEL 5 CAVEAT- Full history could not be obtained due to mental disorder.  Allergies  Review of patient's allergies indicates no known allergies.  Home Medications   Prior to Admission medications   Medication Sig Start Date End Date Taking? Authorizing Provider  amLODipine (NORVASC) 5 MG tablet Take 5 mg by mouth every morning.    Yes Historical Provider, MD  aspirin EC 81 MG tablet Take 81 mg by mouth every morning.    Yes Historical Provider, MD  buPROPion (WELLBUTRIN XL) 300 MG 24 hr tablet Take 300 mg by mouth every morning.   Yes Historical Provider, MD  cholecalciferol (VITAMIN D) 1000 UNITS tablet Take 1,000 Units by mouth every morning.   Yes Historical Provider, MD  clonazePAM (KLONOPIN) 0.5 MG tablet Take 0.5 mg by mouth every 12 (twelve) hours as needed (For agitation.).   Yes Historical Provider, MD  dicyclomine (BENTYL) 20 MG tablet Take 20 mg by mouth 4 (four) times daily.    Yes Historical Provider, MD  divalproex (DEPAKOTE ER) 500 MG 24 hr tablet Take 500 mg by mouth 2 (two) times daily.   Yes Historical Provider, MD  fenofibrate 54 MG tablet Take 54 mg by mouth daily.   Yes Historical Provider, MD  gabapentin (NEURONTIN) 300 MG capsule Take 300  mg by mouth at bedtime.   Yes Historical Provider, MD  glipiZIDE (GLUCOTROL XL) 2.5 MG 24 hr tablet Take 2.5 mg by mouth daily with breakfast.   Yes Historical Provider, MD  loratadine (CLARITIN) 10 MG tablet Take 10 mg by mouth every morning.    Yes Historical Provider, MD  lubiprostone (AMITIZA) 24 MCG capsule Take 24 mcg by mouth 2 (two) times daily.   Yes Historical Provider, MD  metoCLOPramide (REGLAN) 10 MG tablet Take 10 mg by mouth 4 (four) times daily -  before meals and at bedtime.    Yes Historical Provider, MD  metoprolol tartrate (LOPRESSOR) 25 MG tablet Take 12.5 mg by mouth 2 (two) times daily.   Yes Historical Provider, MD  omeprazole (PRILOSEC) 20 MG capsule  Take 20 mg by mouth every morning.    Yes Historical Provider, MD  ondansetron (ZOFRAN) 4 MG tablet Take 4 mg by mouth every 6 (six) hours as needed for nausea.   Yes Historical Provider, MD  polyethylene glycol (MIRALAX / GLYCOLAX) packet Take 17 g by mouth every morning.    Yes Historical Provider, MD  pravastatin (PRAVACHOL) 20 MG tablet Take 20 mg by mouth daily.   Yes Historical Provider, MD  QUEtiapine (SEROQUEL) 100 MG tablet Take 100 mg by mouth 2 (two) times daily.   Yes Historical Provider, MD  QUEtiapine (SEROQUEL) 200 MG tablet Take 600 mg by mouth at bedtime.   Yes Historical Provider, MD  QUEtiapine (SEROQUEL) 50 MG tablet Take 100 mg by mouth every 12 (twelve) hours as needed (For agitation.).   Yes Historical Provider, MD  testosterone cypionate (DEPOTESTOTERONE CYPIONATE) 100 MG/ML injection Inject 100 mg into the muscle every 28 (twenty-eight) days. For IM use only   Yes Historical Provider, MD  tiZANidine (ZANAFLEX) 4 MG tablet Take 4 mg by mouth every 6 (six) hours as needed for muscle spasms.   Yes Historical Provider, MD  traMADol (ULTRAM) 50 MG tablet Take by mouth 3 (three) times daily as needed (For abdominal pain.).   Yes Historical Provider, MD  vitamin B-12 (CYANOCOBALAMIN) 1000 MCG tablet Take 1,000 mcg by mouth every morning.    Yes Historical Provider, MD  Vitamin D, Ergocalciferol, (DRISDOL) 50000 UNITS CAPS capsule Take 50,000 Units by mouth 2 (two) times a week. He takes on Tuesdays and Thursdays.   Yes Historical Provider, MD  hydrOXYzine (ATARAX/VISTARIL) 25 MG tablet Take 25 mg by mouth 4 (four) times daily as needed for anxiety.    Historical Provider, MD   BP 133/89  Pulse 108  Temp(Src) 98.3 F (36.8 C) (Oral)  Resp 16  SpO2 99% Physical Exam  Nursing note and vitals reviewed. Constitutional: He is oriented to person, place, and time. He appears well-developed and well-nourished. He is uncooperative.  HENT:  Head: Normocephalic and atraumatic.  Eyes:  EOM are normal. Pupils are equal, round, and reactive to light.  Neck: Normal range of motion.  Cardiovascular: Normal rate, regular rhythm and normal heart sounds.  Exam reveals no gallop and no friction rub.   No murmur heard. Pulmonary/Chest: Effort normal and breath sounds normal. No respiratory distress. He has no wheezes. He has no rales.  Musculoskeletal: Normal range of motion.  Neurological: He is alert and oriented to person, place, and time.  Skin: Skin is warm and dry.  Psychiatric: His affect is angry and inappropriate. He is agitated, aggressive and combative. He expresses inappropriate judgment. He expresses homicidal ideation. He expresses homicidal plans.    ED  Course  Procedures (including critical care time) DIAGNOSTIC STUDIES: COORDINATION OF CARE: 2:34 PM- Will order standard medical clearance labs.   Medications  ibuprofen (ADVIL,MOTRIN) tablet 600 mg (not administered)  zolpidem (AMBIEN) tablet 5 mg (not administered)  nicotine (NICODERM CQ - dosed in mg/24 hours) patch 21 mg (21 mg Transdermal Not Given 10/04/13 1718)  ondansetron (ZOFRAN) tablet 4 mg (not administered)  LORazepam (ATIVAN) injection 1 mg (not administered)  ziprasidone (GEODON) injection 20 mg (20 mg Intramuscular Given 10/04/13 1515)  sterile water (preservative free) injection (1.5 mLs  Given 10/04/13 1515)    Labs Review Labs Reviewed  COMPREHENSIVE METABOLIC PANEL - Abnormal; Notable for the following:    Potassium 3.6 (*)    Glucose, Bld 160 (*)    BUN 25 (*)    Creatinine, Ser 2.56 (*)    Calcium 10.9 (*)    Total Protein 9.0 (*)    GFR calc non Af Amer 27 (*)    GFR calc Af Amer 31 (*)    Anion gap 18 (*)    All other components within normal limits  SALICYLATE LEVEL - Abnormal; Notable for the following:    Salicylate Lvl <8.3 (*)    All other components within normal limits  ACETAMINOPHEN LEVEL  CBC  ETHANOL  URINE RAPID DRUG SCREEN (HOSP PERFORMED)    Imaging Review No  results found.   EKG Interpretation None      MDM   Final diagnoses:  Aggressive behavior of adult  Mental retardation  Hypokalemia  Schizoaffective disorder, unspecified type  Elevated serum creatinine   Patient is a 53 y.o. Male who presents to the ED with aggressive behavior at the group home.  Physical exam reveals agitated and combative patient who is screaming obscenities.  Patient IVC'd by group home.  Patient given geodon here in the ED to obtain blood work after review of previous EKG with no prolonged QT.  Basic labs drawn which shows mild hypokalemia and some elevation in SCr from labs previously drawn in December of 2014 which were also elevated at baseline.  All other labs were unremarkable.  Chart review reveals no history of CKD or Renal failure.  Patient likely needs repeat BMP to follow SCr and to have aggressive oral rehydration.  Patient placed in psych hold and is to be seen by TTS.  Home meds reordered.  Awaiting psych input.  Psych doc notified of increased SCr.  I personally performed the services described in this documentation, which was scribed in my presence. The recorded information has been reviewed and is accurate.    Cherylann Parr, PA-C 10/04/13 2029

## 2013-10-04 NOTE — ED Notes (Signed)
Bed: WLPT3 Expected date:  Expected time:  Means of arrival:  Comments: Police/[facility]C

## 2013-10-04 NOTE — ED Notes (Signed)
Pt's belongings have been taken and are being held by triage nurse station, security has wanded pt and belonging pt had blue jeans, blue polo, under shirt, blue tube socks, and black plastic digital watch.

## 2013-10-04 NOTE — ED Notes (Signed)
Per [employer], Pt is being IVC'd by [facility] w/ group home.  Sts Pt is SI, HI, and aggressive toward other group home residents.  Pt is refusing to answer questions and continues to curse and scream at officers.  [employer] reports that the Pt is continually stating that he is going to take their guns and shoot them in the head.  Pt is adamant that he has not been taking medications and will not take medications.  Hx of MR, Tourette's, and schizoaffective disorder.  Pt is HOH and does not have his hearing aids.

## 2013-10-04 NOTE — ED Notes (Addendum)
Pt continues to threaten officers, curse, and scream.  PA notified.

## 2013-10-05 DIAGNOSIS — F911 Conduct disorder, childhood-onset type: Secondary | ICD-10-CM | POA: Diagnosis not present

## 2013-10-05 LAB — CBG MONITORING, ED: Glucose-Capillary: 173 mg/dL — ABNORMAL HIGH (ref 70–99)

## 2013-10-05 LAB — VALPROIC ACID LEVEL

## 2013-10-05 NOTE — BH Assessment (Signed)
[facility] Assessment Progress Note      Spoke with Claiborne Billings at [facility] who reports he can pick the patient up sometime between 3:00-4:00.

## 2013-10-05 NOTE — ED Notes (Signed)
Dr Lovena Le and Wilnette Kales NP into see

## 2013-10-05 NOTE — ED Notes (Signed)
[facility] is here to pick pt up

## 2013-10-05 NOTE — ED Notes (Signed)
Have pt follow up with his primary care MD concerning his blood work per Dr Kenton Kingfisher

## 2013-10-05 NOTE — Consult Note (Signed)
[facility] Consult   Reason for Consult:  Making threats to kill self and others at the group home Referring Physician:  ER MD  Shawn Baird is an 53 y.o. male. Total Time spent with patient: 30 minutes  Assessment: AXIS I:  Adjustment Disorder with Disturbance of Conduct AXIS II:  Mental retardation, severity unknown AXIS III:   Past Medical History  Diagnosis Date  . Diabetes mellitus   . Anxiety   . Hypertension   . Hyperlipemia   . Mental disorder   . Schizoaffective disorder   . Impulsive control disorder  . Pancreatitis   . Otitis media   . Cancer   . HOH (hard of hearing)   . Tourette's disease   . Mental retardation   . Chronic constipation    AXIS IV:  developmental disability and chronic mental illness AXIS V:  61-70 mild symptoms  Plan:  No evidence of imminent risk to self or others at present.    Subjective:   Shawn Baird is a 53 y.o. male patient admitted with threats to self and others.  HPI:  Shawn Baird has developmental disabilities and is not a good historian.  He says his one to one makes him mad and he does not want to return to the group home.  Says there was an issue with being accused of smoking that made him mad so he ran away at least twice and one time tried to scratch his arm with a rock.  The group home staff reported he seemed to be upset about changes in the furniture being moved by an exterminator and smoking times.  Today he says he does not want to hurt himself or anybody else but he does not want to go back to the group home. HPI Elements:   Location:  developmental disability and anger with threats to self and others. Quality:  ran away twice and was trying to scratch his arm with a rock. Severity:  as above. denies any wish to hurt himself or others this morning. Timing:  disagreement with staff. Duration:  one day. Context:  as above.  Past Psychiatric History: Past Medical History  Diagnosis Date  . Diabetes  mellitus   . Anxiety   . Hypertension   . Hyperlipemia   . Mental disorder   . Schizoaffective disorder   . Impulsive control disorder  . Pancreatitis   . Otitis media   . Cancer   . HOH (hard of hearing)   . Tourette's disease   . Mental retardation   . Chronic constipation     reports that he has never smoked. He has never used smokeless tobacco. He reports that he does not drink alcohol or use illicit drugs. Family History  Problem Relation Age of Onset  . High blood pressure Sister   . High Cholesterol Sister   . Heart disease Mother   . Stroke Mother    Family History Substance Abuse: No Family Supports:  (Sister, group home staff) Living Arrangements: Other (Comment) (Group Home) Can pt return to current living arrangement?: Yes   Allergies:  No Known Allergies  ACT Assessment Complete:  Yes:    Educational Status    Risk to Self: Risk to self with the past 6 months Suicidal Ideation: No-Not Currently/Within Last 6 Months Suicidal Intent: No-Not Currently/Within Last 6 Months Is patient at risk for suicide?: No Suicidal Plan?: No-Not Currently/Within Last 6 Months Access to Means: No What has been your use of drugs/alcohol  within the last 12 months?: none Previous Attempts/Gestures: Yes Triggers for Past Attempts: Unknown;Hallucinations Intentional Self Injurious Behavior: None Family Suicide History: Unknown Recent stressful life event(s): Other (Comment) (non-compliant with medication, ) Persecutory voices/beliefs?: No Depression: No Substance abuse history and/or treatment for substance abuse?: No  Risk to Others: Risk to Others within the past 6 months Homicidal Ideation: No-Not Currently/Within Last 6 Months Thoughts of Harm to Others: No-Not Currently Present/Within Last 6 Months Current Homicidal Intent: No-Not Currently/Within Last 6 Months Current Homicidal Plan: No-Not Currently/Within Last 6 Months Access to Homicidal Means: No History of harm to  others?: Yes Assessment of Violence: On admission (Verbal aggression towards Police and staff) Violent Behavior Description: verbal threats to Police and staff Does patient have access to weapons?: No Criminal Charges Pending?: No Does patient have a court date: No  Abuse:    Prior Inpatient Therapy: Prior Inpatient Therapy Prior Inpatient Therapy: Yes Prior Therapy Dates: unknown Prior Therapy Facilty/Provider(s): unknown Reason for Treatment: aggression  Prior Outpatient Therapy: Prior Outpatient Therapy Prior Outpatient Therapy: Yes Prior Therapy Facilty/Provider(s): [facility] Reason for Treatment: schizophrenia  Additional Information: Additional Information 1:1 In Past 12 Months?: No CIRT Risk: No Elopement Risk: No Does patient have medical clearance?: Yes                  Objective: Blood pressure 118/95, pulse 102, temperature 99.6 F (37.6 C), temperature source Oral, resp. rate 21, SpO2 98.00%.There is no weight on file to calculate BMI. Results for orders placed during the hospital encounter of 10/04/13 (from the past 72 hour(s))  ACETAMINOPHEN LEVEL     Status: None   Collection Time    10/04/13  4:20 PM      Result Value Ref Range   Acetaminophen (Tylenol), Serum <15.0  10 - 30 ug/mL   Comment:            THERAPEUTIC CONCENTRATIONS VARY     SIGNIFICANTLY. A RANGE OF 10-30     ug/mL MAY BE AN EFFECTIVE     CONCENTRATION FOR MANY PATIENTS.     HOWEVER, SOME ARE BEST TREATED     AT CONCENTRATIONS OUTSIDE THIS     RANGE.     ACETAMINOPHEN CONCENTRATIONS     >150 ug/mL AT 4 HOURS AFTER     INGESTION AND >50 ug/mL AT 12     HOURS AFTER INGESTION ARE     OFTEN ASSOCIATED WITH TOXIC     REACTIONS.  CBC     Status: None   Collection Time    10/04/13  4:20 PM      Result Value Ref Range   WBC 6.6  4.0 - 10.5 K/uL   RBC 4.76  4.22 - 5.81 MIL/uL   Hemoglobin 14.7  13.0 - 17.0 g/dL   HCT 42.5  39.0 - 52.0 %   MCV 89.3  78.0 - 100.0 fL   MCH 30.9  26.0 -  34.0 pg   MCHC 34.6  30.0 - 36.0 g/dL   RDW 13.3  11.5 - 15.5 %   Platelets 315  150 - 400 K/uL  COMPREHENSIVE METABOLIC PANEL     Status: Abnormal   Collection Time    10/04/13  4:20 PM      Result Value Ref Range   Sodium 138  137 - 147 mEq/L   Potassium 3.6 (*) 3.7 - 5.3 mEq/L   Chloride 99  96 - 112 mEq/L   CO2 21  19 - 32 mEq/L  Glucose, Bld 160 (*) 70 - 99 mg/dL   BUN 25 (*) 6 - 23 mg/dL   Creatinine, Ser 2.56 (*) 0.50 - 1.35 mg/dL   Calcium 10.9 (*) 8.4 - 10.5 mg/dL   Total Protein 9.0 (*) 6.0 - 8.3 g/dL   Albumin 4.7  3.5 - 5.2 g/dL   AST 29  0 - 37 U/L   ALT 25  0 - 53 U/L   Alkaline Phosphatase 86  39 - 117 U/L   Total Bilirubin 0.6  0.3 - 1.2 mg/dL   GFR calc non Af Amer 27 (*) >90 mL/min   GFR calc Af Amer 31 (*) >90 mL/min   Comment: (NOTE)     The eGFR has been calculated using the CKD EPI equation.     This calculation has not been validated in all clinical situations.     eGFR's persistently <90 mL/min signify possible Chronic Kidney     Disease.   Anion gap 18 (*) 5 - 15  ETHANOL     Status: None   Collection Time    10/04/13  4:20 PM      Result Value Ref Range   Alcohol, Ethyl (B) <11  0 - 11 mg/dL   Comment:            LOWEST DETECTABLE LIMIT FOR     SERUM ALCOHOL IS 11 mg/dL     FOR MEDICAL PURPOSES ONLY  SALICYLATE LEVEL     Status: Abnormal   Collection Time    10/04/13  4:20 PM      Result Value Ref Range   Salicylate Lvl <0.7 (*) 2.8 - 20.0 mg/dL  VALPROIC ACID LEVEL     Status: Abnormal   Collection Time    10/04/13  4:20 PM      Result Value Ref Range   Valproic Acid Lvl <10.0 (*) 50.0 - 100.0 ug/mL   Comment: Performed at [facility] ([place])     Status: None   Collection Time    10/04/13  8:25 PM      Result Value Ref Range   Opiates NONE DETECTED  NONE DETECTED   Cocaine NONE DETECTED  NONE DETECTED   Benzodiazepines NONE DETECTED  NONE DETECTED   Amphetamines NONE DETECTED  NONE DETECTED    Tetrahydrocannabinol NONE DETECTED  NONE DETECTED   Barbiturates NONE DETECTED  NONE DETECTED   Comment:            DRUG SCREEN FOR MEDICAL PURPOSES     ONLY.  IF CONFIRMATION IS NEEDED     FOR ANY PURPOSE, NOTIFY LAB     WITHIN 5 DAYS.                LOWEST DETECTABLE LIMITS     FOR URINE DRUG SCREEN     Drug Class       Cutoff (ng/mL)     Amphetamine      1000     Barbiturate      200     Benzodiazepine   680     Tricyclics       881     Opiates          300     Cocaine          300     THC              50  CBG MONITORING, ED  Status: Abnormal   Collection Time    10/05/13  8:22 AM      Result Value Ref Range   Glucose-Capillary 173 (*) 70 - 99 mg/dL   Labs are reviewed and are pertinent for no psychiatric issue.  Current Facility-Administered Medications  Medication Dose Route Frequency Provider Last Rate Last Dose  . amLODipine (NORVASC) tablet 5 mg  5 mg Oral q morning - 10a Lurena Nida, NP   5 mg at 10/05/13 5885  . aspirin EC tablet 81 mg  81 mg Oral q morning - 10a Lurena Nida, NP   81 mg at 10/05/13 0919  . buPROPion (WELLBUTRIN XL) 24 hr tablet 300 mg  300 mg Oral q morning - 10a Lurena Nida, NP   300 mg at 10/05/13 0916  . divalproex (DEPAKOTE ER) 24 hr tablet 500 mg  500 mg Oral BID Lurena Nida, NP   500 mg at 10/05/13 0916  . fenofibrate tablet 54 mg  54 mg Oral Daily Lurena Nida, NP   54 mg at 10/05/13 0919  . gabapentin (NEURONTIN) capsule 300 mg  300 mg Oral QHS Lurena Nida, NP   300 mg at 10/04/13 2209  . glipiZIDE (GLUCOTROL XL) 24 hr tablet 2.5 mg  2.5 mg Oral Q breakfast Lurena Nida, NP   2.5 mg at 10/05/13 0277  . hydrOXYzine (ATARAX/VISTARIL) tablet 25 mg  25 mg Oral QID PRN Lurena Nida, NP      . ibuprofen (ADVIL,MOTRIN) tablet 600 mg  600 mg Oral Q8H PRN Courtney A Forcucci, PA-C      . lubiprostone (AMITIZA) capsule 24 mcg  24 mcg Oral BID Lurena Nida, NP   24 mcg at 10/05/13 0919  . metoprolol tartrate (LOPRESSOR) tablet 12.5 mg   12.5 mg Oral BID Lurena Nida, NP   12.5 mg at 10/05/13 4128  . nicotine (NICODERM CQ - dosed in mg/24 hours) patch 21 mg  21 mg Transdermal Daily Courtney A Forcucci, PA-C      . ondansetron (ZOFRAN) tablet 4 mg  4 mg Oral Q8H PRN Courtney A Forcucci, PA-C   4 mg at 10/04/13 2209  . pantoprazole (PROTONIX) EC tablet 40 mg  40 mg Oral Daily Lurena Nida, NP   40 mg at 10/05/13 0920  . QUEtiapine (SEROQUEL) tablet 100 mg  100 mg Oral BID Lurena Nida, NP   100 mg at 10/05/13 0916  . QUEtiapine (SEROQUEL) tablet 100 mg  100 mg Oral Q12H PRN Lurena Nida, NP      . QUEtiapine (SEROQUEL) tablet 600 mg  600 mg Oral QHS Lurena Nida, NP   600 mg at 10/04/13 2210  . Vitamin D (Ergocalciferol) (DRISDOL) capsule 50,000 Units  50,000 Units Oral Once per day on Mon Thu Lurena Nida, NP   50,000 Units at 10/05/13 0920   Current Outpatient Prescriptions  Medication Sig Dispense Refill  . amLODipine (NORVASC) 5 MG tablet Take 5 mg by mouth every morning.       Marland Kitchen aspirin EC 81 MG tablet Take 81 mg by mouth every morning.       Marland Kitchen buPROPion (WELLBUTRIN XL) 300 MG 24 hr tablet Take 300 mg by mouth every morning.      . cholecalciferol (VITAMIN D) 1000 UNITS tablet Take 1,000 Units by mouth every morning.      . clonazePAM (KLONOPIN) 0.5 MG tablet Take 0.5 mg by mouth every 12 (twelve)  hours as needed (For agitation.).      Marland Kitchen dicyclomine (BENTYL) 20 MG tablet Take 20 mg by mouth 4 (four) times daily.       . divalproex (DEPAKOTE ER) 500 MG 24 hr tablet Take 500 mg by mouth 2 (two) times daily.      . fenofibrate 54 MG tablet Take 54 mg by mouth daily.      Marland Kitchen gabapentin (NEURONTIN) 300 MG capsule Take 300 mg by mouth at bedtime.      Marland Kitchen glipiZIDE (GLUCOTROL XL) 2.5 MG 24 hr tablet Take 2.5 mg by mouth daily with breakfast.      . loratadine (CLARITIN) 10 MG tablet Take 10 mg by mouth every morning.       . lubiprostone (AMITIZA) 24 MCG capsule Take 24 mcg by mouth 2 (two) times daily.      . metoCLOPramide  (REGLAN) 10 MG tablet Take 10 mg by mouth 4 (four) times daily -  before meals and at bedtime.       . metoprolol tartrate (LOPRESSOR) 25 MG tablet Take 12.5 mg by mouth 2 (two) times daily.      Marland Kitchen omeprazole (PRILOSEC) 20 MG capsule Take 20 mg by mouth every morning.       . ondansetron (ZOFRAN) 4 MG tablet Take 4 mg by mouth every 6 (six) hours as needed for nausea.      . polyethylene glycol (MIRALAX / GLYCOLAX) packet Take 17 g by mouth every morning.       . pravastatin (PRAVACHOL) 20 MG tablet Take 20 mg by mouth daily.      . QUEtiapine (SEROQUEL) 100 MG tablet Take 100 mg by mouth 2 (two) times daily.      . QUEtiapine (SEROQUEL) 200 MG tablet Take 600 mg by mouth at bedtime.      Marland Kitchen QUEtiapine (SEROQUEL) 50 MG tablet Take 100 mg by mouth every 12 (twelve) hours as needed (For agitation.).      Marland Kitchen testosterone cypionate (DEPOTESTOTERONE CYPIONATE) 100 MG/ML injection Inject 100 mg into the muscle every 28 (twenty-eight) days. For IM use only      . tiZANidine (ZANAFLEX) 4 MG tablet Take 4 mg by mouth every 6 (six) hours as needed for muscle spasms.      . traMADol (ULTRAM) 50 MG tablet Take by mouth 3 (three) times daily as needed (For abdominal pain.).      Marland Kitchen vitamin B-12 (CYANOCOBALAMIN) 1000 MCG tablet Take 1,000 mcg by mouth every morning.       . Vitamin D, Ergocalciferol, (DRISDOL) 50000 UNITS CAPS capsule Take 50,000 Units by mouth 2 (two) times a week. He takes on Tuesdays and Thursdays.      . hydrOXYzine (ATARAX/VISTARIL) 25 MG tablet Take 25 mg by mouth 4 (four) times daily as needed for anxiety.        Psychiatric Specialty Exam:     Blood pressure 118/95, pulse 102, temperature 99.6 F (37.6 C), temperature source Oral, resp. rate 21, SpO2 98.00%.There is no weight on file to calculate BMI.  General Appearance: Casual  Eye Contact::  Good  Speech:  clear   Volume:  Decreased  Mood:  Euthymic  Affect:  Blunt  Thought Process:  Coherent  Orientation:  Full (Time, Place,  and Person)  Thought Content:  Negative  Suicidal Thoughts:  No  Homicidal Thoughts:  No  Memory:  Immediate;   Poor Recent;   Poor Remote;   Poor  Judgement:  Impaired  Insight:  Lacking  Psychomotor Activity:  Normal  Concentration:  Fair  Recall:  Poor  Fund of Knowledge:Poor  Language: Fair  Akathisia:  Negative  Handed:  Right  AIMS (if indicated):     Assets:  Housing Social Support  Sleep:      Musculoskeletal: Strength & Muscle Tone: within normal limits Gait & Station: normal Patient leans: N/A  Treatment Plan Summary: discharge to the group home as he does not meet citeria for inpatient   TAYLOR,GERALD D 10/05/2013 10:56 AM

## 2013-10-05 NOTE — ED Notes (Signed)
Patient sleeping peacefully will make day shift vitals was not obtained because of patient resting.

## 2013-10-05 NOTE — ED Notes (Signed)
Nad, resting quietly

## 2013-10-05 NOTE — ED Notes (Addendum)
Written dc instructions reviewed w/ Claiborne Billings from group home, pt encouraged to follow up with his PMD concerning his lab work, take his medication as directed.  Kelly verbalized understanding.  Pt sitting quietly on the bed watching tv while instructions were being reviewed, remains pleasant, pt encouraged to let the group home staff know if he is starting to get irritated by others at the group home-pt agreed to do so.

## 2013-10-05 NOTE — [facility] Suicide Risk Assessment (Signed)
Suicide Risk Assessment  Discharge Assessment     Demographic Factors:  Male  Total Time spent with patient: 30 minutes  Psychiatric Specialty Exam:     Blood pressure 118/95, pulse 102, temperature 99.6 F (37.6 C), temperature source Oral, resp. rate 21, SpO2 98.00%.There is no weight on file to calculate BMI.  General Appearance: Casual  Eye Contact::  Good  Speech:  Clear and Coherent  Volume:  Decreased  Mood:  Euthymic  Affect:  Blunt  Thought Process:  Coherent  Orientation:  Full (Time, Place, and Person)  Thought Content:  Negative  Suicidal Thoughts:  No  Homicidal Thoughts:  No  Memory:  Immediate;   Poor Recent;   Poor Remote;   Poor  Judgement:  Impaired  Insight:  Lacking  Psychomotor Activity:  Normal  Concentration:  Fair  Recall:  Poor  Fund of Knowledge:Poor  Language: Fair  Akathisia:  Negative  Handed:  Right  AIMS (if indicated):     Assets:  Housing Social Support  Sleep:       Musculoskeletal: Strength & Muscle Tone: within normal limits Gait & Station: normal Patient leans: N/A   Mental Status Per Nursing Assessment::   On Admission:     Current Mental Status by Physician: NA  Loss Factors: NA  Historical Factors: NA  Risk Reduction Factors:   NA  Continued Clinical Symptoms:  Previous Psychiatric Diagnoses and Treatments  Cognitive Features That Contribute To Risk:  Closed-mindedness    Suicide Risk:  Minimal: No identifiable suicidal ideation.  Patients presenting with no risk factors but with morbid ruminations; may be classified as minimal risk based on the severity of the depressive symptoms  Discharge Diagnoses:   AXIS I:  Adjustment Disorder with Disturbance of Conduct AXIS II:  Mental retardation, severity unknown AXIS III:   Past Medical History  Diagnosis Date  . Diabetes mellitus   . Anxiety   . Hypertension   . Hyperlipemia   . Mental disorder   . Schizoaffective disorder   . Impulsive control  disorder  . Pancreatitis   . Otitis media   . Cancer   . HOH (hard of hearing)   . Tourette's disease   . Mental retardation   . Chronic constipation    AXIS IV:  chronic developmental disability AXIS V:  61-70 mild symptoms  Plan Of Care/Follow-up recommendations:  Activity:  resume usual activity Diet:  resume usual diet  Is patient on multiple antipsychotic therapies at discharge:  No   Has Patient had three or more failed trials of antipsychotic monotherapy by history:  No  Recommended Plan for Multiple Antipsychotic Therapies: NA    TAYLOR,GERALD D 10/05/2013, 11:11 AM

## 2013-10-05 NOTE — [facility] Counselor (Signed)
Writer left voicemail for Mr Richardson Landry of [place] [phone]. Writer also called Environmental consultant but voicemail wasn't set up for [phone].  Arnold Long, [place] Assessment Counselor

## 2013-10-09 NOTE — ED Provider Notes (Signed)
Medical screening examination/treatment/procedure(s) were performed by non-physician practitioner and as supervising physician I was immediately available for consultation/collaboration.   EKG Interpretation None        Ephraim Hamburger, MD 10/09/13 [phone]

## 2013-10-12 DIAGNOSIS — I10 Essential (primary) hypertension: Secondary | ICD-10-CM | POA: Diagnosis not present

## 2013-10-12 DIAGNOSIS — IMO0002 Reserved for concepts with insufficient information to code with codable children: Secondary | ICD-10-CM | POA: Diagnosis not present

## 2013-10-12 DIAGNOSIS — R45851 Suicidal ideations: Secondary | ICD-10-CM | POA: Diagnosis not present

## 2013-10-12 DIAGNOSIS — F411 Generalized anxiety disorder: Secondary | ICD-10-CM | POA: Insufficient documentation

## 2013-10-12 DIAGNOSIS — Z7982 Long term (current) use of aspirin: Secondary | ICD-10-CM | POA: Diagnosis not present

## 2013-10-12 DIAGNOSIS — F911 Conduct disorder, childhood-onset type: Secondary | ICD-10-CM | POA: Diagnosis not present

## 2013-10-12 DIAGNOSIS — K5900 Constipation, unspecified: Secondary | ICD-10-CM | POA: Insufficient documentation

## 2013-10-12 DIAGNOSIS — E785 Hyperlipidemia, unspecified: Secondary | ICD-10-CM | POA: Insufficient documentation

## 2013-10-12 DIAGNOSIS — F79 Unspecified intellectual disabilities: Secondary | ICD-10-CM | POA: Insufficient documentation

## 2013-10-12 DIAGNOSIS — Z859 Personal history of malignant neoplasm, unspecified: Secondary | ICD-10-CM | POA: Diagnosis not present

## 2013-10-12 DIAGNOSIS — E119 Type 2 diabetes mellitus without complications: Secondary | ICD-10-CM | POA: Insufficient documentation

## 2013-10-12 DIAGNOSIS — R45850 Homicidal ideations: Secondary | ICD-10-CM | POA: Insufficient documentation

## 2013-10-12 DIAGNOSIS — Z046 Encounter for general psychiatric examination, requested by authority: Secondary | ICD-10-CM | POA: Diagnosis present

## 2013-10-12 DIAGNOSIS — Z79899 Other long term (current) drug therapy: Secondary | ICD-10-CM | POA: Diagnosis not present

## 2013-10-12 DIAGNOSIS — F259 Schizoaffective disorder, unspecified: Secondary | ICD-10-CM | POA: Insufficient documentation

## 2013-10-12 DIAGNOSIS — Z8669 Personal history of other diseases of the nervous system and sense organs: Secondary | ICD-10-CM | POA: Diagnosis not present

## 2013-10-13 ENCOUNTER — Encounter (HOSPITAL_COMMUNITY): Payer: Self-pay | Admitting: Emergency Medicine

## 2013-10-13 ENCOUNTER — Emergency Department (HOSPITAL_COMMUNITY)
Admission: EM | Admit: 2013-10-13 | Discharge: 2013-10-14 | Disposition: A | Discharge status: Home / Self Care | Payer: Medicare Other | Attending: Emergency Medicine | Admitting: Emergency Medicine

## 2013-10-13 DIAGNOSIS — R4689 Other symptoms and signs involving appearance and behavior: Secondary | ICD-10-CM

## 2013-10-13 DIAGNOSIS — F79 Unspecified intellectual disabilities: Secondary | ICD-10-CM

## 2013-10-13 DIAGNOSIS — R45850 Homicidal ideations: Secondary | ICD-10-CM

## 2013-10-13 DIAGNOSIS — F259 Schizoaffective disorder, unspecified: Secondary | ICD-10-CM | POA: Diagnosis not present

## 2013-10-13 DIAGNOSIS — F250 Schizoaffective disorder, bipolar type: Secondary | ICD-10-CM

## 2013-10-13 DIAGNOSIS — R45851 Suicidal ideations: Secondary | ICD-10-CM

## 2013-10-13 LAB — COMPREHENSIVE METABOLIC PANEL
ALT: 20 U/L (ref 0–53)
AST: 19 U/L (ref 0–37)
Albumin: 4.2 g/dL (ref 3.5–5.2)
Alkaline Phosphatase: 78 U/L (ref 39–117)
Anion gap: 15 (ref 5–15)
BUN: 11 mg/dL (ref 6–23)
CALCIUM: 10 mg/dL (ref 8.4–10.5)
CO2: 23 meq/L (ref 19–32)
Chloride: 104 mEq/L (ref 96–112)
Creatinine, Ser: 1.81 mg/dL — ABNORMAL HIGH (ref 0.50–1.35)
GFR calc Af Amer: 47 mL/min — ABNORMAL LOW (ref >90)
GFR, EST NON AFRICAN AMERICAN: 41 mL/min — AB (ref >90)
Glucose, Bld: 153 mg/dL — ABNORMAL HIGH (ref 70–99)
Potassium: 3.9 mEq/L (ref 3.7–5.3)
Sodium: 142 mEq/L (ref 137–147)
TOTAL PROTEIN: 8.3 g/dL (ref 6.0–8.3)
Total Bilirubin: 0.4 mg/dL (ref 0.3–1.2)

## 2013-10-13 LAB — CBC
HCT: 41.6 % (ref 39.0–52.0)
Hemoglobin: 14.2 g/dL (ref 13.0–17.0)
MCH: 30.5 pg (ref 26.0–34.0)
MCHC: 34.1 g/dL (ref 30.0–36.0)
MCV: 89.5 fL (ref 78.0–100.0)
PLATELETS: 308 10*3/uL (ref 150–400)
RBC: 4.65 MIL/uL (ref 4.22–5.81)
RDW: 13.1 % (ref 11.5–15.5)
WBC: 6.8 10*3/uL (ref 4.0–10.5)

## 2013-10-13 LAB — RAPID URINE DRUG SCREEN, HOSP PERFORMED
AMPHETAMINES: NOT DETECTED
BENZODIAZEPINES: NOT DETECTED
Barbiturates: NOT DETECTED
COCAINE: NOT DETECTED
Opiates: NOT DETECTED
Tetrahydrocannabinol: NOT DETECTED

## 2013-10-13 LAB — ETHANOL

## 2013-10-13 LAB — ACETAMINOPHEN LEVEL: Acetaminophen (Tylenol), Serum: <15 ug/mL (ref 10–30)

## 2013-10-13 LAB — SALICYLATE LEVEL: Salicylate Lvl: <2 mg/dL — ABNORMAL LOW (ref 2.8–20.0)

## 2013-10-13 MED ORDER — AMLODIPINE BESYLATE 5 MG PO TABS
5.0000 mg | ORAL_TABLET | Freq: Every morning | ORAL | Status: DC
Start: 2013-10-13 — End: 2013-10-14
  Administered 2013-10-14: 5 mg via ORAL
  Filled 2013-10-13 (×2): qty 1

## 2013-10-13 MED ORDER — TESTOSTERONE CYPIONATE 100 MG/ML IM SOLN: 100.0000 mg | INTRAMUSCULAR | Status: DC

## 2013-10-13 MED ORDER — LUBIPROSTONE 24 MCG PO CAPS
24.0000 ug | ORAL_CAPSULE | Freq: Two times a day (BID) | ORAL | Status: DC
  Administered 2013-10-14: 24 ug via ORAL
  Filled 2013-10-13 (×4): qty 1

## 2013-10-13 MED ORDER — ONDANSETRON HCL 4 MG PO TABS: 4.0000 mg | ORAL_TABLET | Freq: Four times a day (QID) | ORAL | Status: DC | PRN

## 2013-10-13 MED ORDER — HYDROXYZINE HCL 25 MG PO TABS: 25.0000 mg | ORAL_TABLET | Freq: Four times a day (QID) | ORAL | Status: DC | PRN

## 2013-10-13 MED ORDER — LORAZEPAM 1 MG PO TABS
1.0000 mg | ORAL_TABLET | Freq: Three times a day (TID) | ORAL | Status: DC | PRN
  Administered 2013-10-13: 1 mg via ORAL
  Filled 2013-10-13 (×2): qty 1

## 2013-10-13 MED ORDER — ASPIRIN EC 81 MG PO TBEC
81.0000 mg | DELAYED_RELEASE_TABLET | Freq: Every morning | ORAL | Status: DC
Start: 2013-10-13 — End: 2013-10-14
  Administered 2013-10-14: 81 mg via ORAL
  Filled 2013-10-13 (×2): qty 1

## 2013-10-13 MED ORDER — TIZANIDINE HCL 4 MG PO TABS
4.0000 mg | ORAL_TABLET | Freq: Four times a day (QID) | ORAL | Status: DC | PRN
  Filled 2013-10-13: qty 1

## 2013-10-13 MED ORDER — FENOFIBRATE 54 MG PO TABS
54.0000 mg | ORAL_TABLET | Freq: Every day | ORAL | Status: DC
  Administered 2013-10-14: 54 mg via ORAL
  Filled 2013-10-13 (×2): qty 1

## 2013-10-13 MED ORDER — ZOLPIDEM TARTRATE 5 MG PO TABS: 5.0000 mg | ORAL_TABLET | Freq: Every evening | ORAL | Status: DC | PRN

## 2013-10-13 MED ORDER — ONDANSETRON HCL 4 MG PO TABS: 4.0000 mg | ORAL_TABLET | Freq: Three times a day (TID) | ORAL | Status: DC | PRN

## 2013-10-13 MED ORDER — GLIPIZIDE ER 2.5 MG PO TB24
2.5000 mg | ORAL_TABLET | Freq: Every day | ORAL | Status: DC
  Administered 2013-10-13 – 2013-10-14 (×2): 2.5 mg via ORAL
  Filled 2013-10-13 (×3): qty 1

## 2013-10-13 MED ORDER — CLONAZEPAM 0.5 MG PO TABS: 0.5000 mg | ORAL_TABLET | Freq: Two times a day (BID) | ORAL | Status: DC | PRN

## 2013-10-13 MED ORDER — PANTOPRAZOLE SODIUM 40 MG PO TBEC
40.0000 mg | DELAYED_RELEASE_TABLET | Freq: Every day | ORAL | Status: DC
  Administered 2013-10-13 – 2013-10-14 (×2): 40 mg via ORAL
  Filled 2013-10-13 (×3): qty 1

## 2013-10-13 MED ORDER — LORATADINE 10 MG PO TABS
10.0000 mg | ORAL_TABLET | Freq: Every morning | ORAL | Status: DC
  Administered 2013-10-14: 10 mg via ORAL
  Filled 2013-10-13 (×2): qty 1

## 2013-10-13 MED ORDER — METOPROLOL TARTRATE 25 MG PO TABS
12.5000 mg | ORAL_TABLET | Freq: Two times a day (BID) | ORAL | Status: DC
  Administered 2013-10-13 – 2013-10-14 (×3): 12.5 mg via ORAL
  Filled 2013-10-13 (×5): qty 1

## 2013-10-13 MED ORDER — SIMVASTATIN 20 MG PO TABS
20.0000 mg | ORAL_TABLET | Freq: Every day | ORAL | Status: DC
Start: 2013-10-13 — End: 2013-10-14
  Filled 2013-10-13 (×2): qty 1

## 2013-10-13 MED ORDER — IBUPROFEN 200 MG PO TABS: 600.0000 mg | ORAL_TABLET | Freq: Three times a day (TID) | ORAL | Status: DC | PRN

## 2013-10-13 MED ORDER — ACETAMINOPHEN 325 MG PO TABS: 650.0000 mg | ORAL_TABLET | ORAL | Status: DC | PRN

## 2013-10-13 MED ORDER — QUETIAPINE FUMARATE 100 MG PO TABS
100.0000 mg | ORAL_TABLET | Freq: Two times a day (BID) | ORAL | Status: DC
Start: 2013-10-13 — End: 2013-10-14
  Administered 2013-10-13 – 2013-10-14 (×3): 100 mg via ORAL
  Filled 2013-10-13 (×4): qty 1

## 2013-10-13 MED ORDER — DICYCLOMINE HCL 20 MG PO TABS
20.0000 mg | ORAL_TABLET | Freq: Four times a day (QID) | ORAL | Status: DC
  Administered 2013-10-13 – 2013-10-14 (×3): 20 mg via ORAL
  Filled 2013-10-13 (×5): qty 1

## 2013-10-13 MED ORDER — ZIPRASIDONE MESYLATE 20 MG IM SOLR: 20.0000 mg | Freq: Once | INTRAMUSCULAR | Status: DC

## 2013-10-13 MED ORDER — ALUM & MAG HYDROXIDE-SIMETH 200-200-20 MG/5ML PO SUSP: 30.0000 mL | ORAL | Status: DC | PRN

## 2013-10-13 MED ORDER — QUETIAPINE FUMARATE 100 MG PO TABS: 100.0000 mg | ORAL_TABLET | Freq: Two times a day (BID) | ORAL | Status: DC | PRN

## 2013-10-13 MED ORDER — METOCLOPRAMIDE HCL 10 MG PO TABS
10.0000 mg | ORAL_TABLET | Freq: Once | ORAL | Status: AC
  Administered 2013-10-13: 10 mg via ORAL
  Filled 2013-10-13: qty 1

## 2013-10-13 MED ORDER — QUETIAPINE FUMARATE 300 MG PO TABS
600.0000 mg | ORAL_TABLET | Freq: Every day | ORAL | Status: DC
  Administered 2013-10-13: 600 mg via ORAL

## 2013-10-13 MED ORDER — METOCLOPRAMIDE HCL 10 MG PO TABS
10.0000 mg | ORAL_TABLET | Freq: Three times a day (TID) | ORAL | Status: DC
Start: 2013-10-13 — End: 2013-10-14
  Administered 2013-10-13 – 2013-10-14 (×4): 10 mg via ORAL
  Filled 2013-10-13 (×5): qty 1

## 2013-10-13 MED ORDER — BUPROPION HCL ER (XL) 300 MG PO TB24
300.0000 mg | ORAL_TABLET | Freq: Every morning | ORAL | Status: DC
  Administered 2013-10-14: 300 mg via ORAL
  Filled 2013-10-13 (×2): qty 1

## 2013-10-13 MED ORDER — DIVALPROEX SODIUM ER 500 MG PO TB24
500.0000 mg | ORAL_TABLET | Freq: Two times a day (BID) | ORAL | Status: DC
Start: 2013-10-13 — End: 2013-10-14
  Administered 2013-10-14: 500 mg via ORAL
  Filled 2013-10-13 (×4): qty 1

## 2013-10-13 MED ORDER — POLYETHYLENE GLYCOL 3350 17 G PO PACK
17.0000 g | PACK | Freq: Every morning | ORAL | Status: DC
  Administered 2013-10-14: 17 g via ORAL
  Filled 2013-10-13 (×2): qty 1

## 2013-10-13 MED ORDER — GABAPENTIN 300 MG PO CAPS
300.0000 mg | ORAL_CAPSULE | Freq: Every day | ORAL | Status: DC
Start: 2013-10-13 — End: 2013-10-14
  Administered 2013-10-13: 300 mg via ORAL
  Filled 2013-10-13: qty 1

## 2013-10-13 NOTE — ED Provider Notes (Signed)
CSN: [id number]     Arrival date & time 10/12/13  2333 History   First MD Initiated Contact with Patient 10/13/13 0221     Chief Complaint  Patient presents with  . IVC   . Suicidal     (Consider location/radiation/quality/duration/timing/severity/associated sxs/prior Treatment) HPI  Level V caveat-patient acutely psychotic and MR  Patient brought to the emergency department by [employer] under IVC paperwork that was taken out by supervisor at his group home. The patient has been refusing to take his medications and is now threatening to commit suicide, to kill others, cursing, shouting and acting aggressive and hostile. He has needed to be admitted to behavior health in the past. The patient will not answer any questions and tells me not to ask him any questions. He babbles and continues to threaten staff and [employer] that he will kill them if we give him a gun.  Past Medical History  Diagnosis Date  . Diabetes mellitus   . Anxiety   . Hypertension   . Hyperlipemia   . Mental disorder   . Schizoaffective disorder   . Impulsive control disorder  . Pancreatitis   . Otitis media   . Cancer   . HOH (hard of hearing)   . Tourette's disease   . Mental retardation   . Chronic constipation    Past Surgical History  Procedure Laterality Date  . 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 High blood pressure Sister   . High Cholesterol Sister   . Heart disease Mother   . Stroke Mother    History  Substance Use Topics  . Smoking status: Never Smoker   . Smokeless tobacco: Never Used  . Alcohol Use: No    Review of Systems Level V caveat-patient acutely psychotic and MR   Allergies  Review of patient's allergies indicates no known allergies.  Home Medications   Prior to Admission medications   Medication Sig Start Date End Date Taking? Authorizing Provider  amLODipine (NORVASC) 5 MG tablet Take 5 mg by mouth every  morning.    Yes Historical Provider, MD  aspirin EC 81 MG tablet Take 81 mg by mouth every morning.    Yes Historical Provider, MD  buPROPion (WELLBUTRIN XL) 300 MG 24 hr tablet Take 300 mg by mouth every morning.   Yes Historical Provider, MD  clonazePAM (KLONOPIN) 0.5 MG tablet Take 0.5 mg by mouth every 12 (twelve) hours as needed (For agitation.).   Yes Historical Provider, MD  dicyclomine (BENTYL) 20 MG tablet Take 20 mg by mouth 4 (four) times daily.    Yes Historical Provider, MD  divalproex (DEPAKOTE ER) 500 MG 24 hr tablet Take 500 mg by mouth 2 (two) times daily.   Yes Historical Provider, MD  fenofibrate 54 MG tablet Take 54 mg by mouth daily.   Yes Historical Provider, MD  gabapentin (NEURONTIN) 300 MG capsule Take 300 mg by mouth at bedtime.   Yes Historical Provider, MD  glipiZIDE (GLUCOTROL XL) 2.5 MG 24 hr tablet Take 2.5 mg by mouth daily with breakfast.   Yes Historical Provider, MD  hydrOXYzine (ATARAX/VISTARIL) 25 MG tablet Take 25 mg by mouth 4 (four) times daily as needed for anxiety.   Yes Historical Provider, MD  loratadine (CLARITIN) 10 MG tablet Take 10 mg by mouth every morning.    Yes Historical Provider, MD  lubiprostone (AMITIZA) 24  MCG capsule Take 24 mcg by mouth 2 (two) times daily.   Yes Historical Provider, MD  metoCLOPramide (REGLAN) 10 MG tablet Take 10 mg by mouth 4 (four) times daily -  before meals and at bedtime.    Yes Historical Provider, MD  metoprolol tartrate (LOPRESSOR) 25 MG tablet Take 12.5 mg by mouth 2 (two) times daily.   Yes Historical Provider, MD  omeprazole (PRILOSEC) 20 MG capsule Take 20 mg by mouth every morning.    Yes Historical Provider, MD  ondansetron (ZOFRAN) 4 MG tablet Take 4 mg by mouth every 6 (six) hours as needed for nausea.   Yes Historical Provider, MD  polyethylene glycol (MIRALAX / GLYCOLAX) packet Take 17 g by mouth every morning.    Yes Historical Provider, MD  pravastatin (PRAVACHOL) 20 MG tablet Take 20 mg by mouth daily.    Yes Historical Provider, MD  QUEtiapine (SEROQUEL) 100 MG tablet Take 100 mg by mouth 2 (two) times daily.   Yes Historical Provider, MD  QUEtiapine (SEROQUEL) 200 MG tablet Take 600 mg by mouth at bedtime.   Yes Historical Provider, MD  QUEtiapine (SEROQUEL) 50 MG tablet Take 100 mg by mouth every 12 (twelve) hours as needed (For agitation.).   Yes Historical Provider, MD  testosterone cypionate (DEPOTESTOTERONE CYPIONATE) 100 MG/ML injection Inject 100 mg into the muscle every 28 (twenty-eight) days. For IM use only   Yes Historical Provider, MD  tiZANidine (ZANAFLEX) 4 MG tablet Take 4 mg by mouth every 6 (six) hours as needed for muscle spasms.   Yes Historical Provider, MD  traMADol (ULTRAM) 50 MG tablet Take by mouth 3 (three) times daily as needed (For abdominal pain.).   Yes Historical Provider, MD  vitamin B-12 (CYANOCOBALAMIN) 1000 MCG tablet Take 1,000 mcg by mouth every morning.    Yes Historical Provider, MD  Vitamin D, Ergocalciferol, (DRISDOL) 50000 UNITS CAPS capsule Take 50,000 Units by mouth 2 (two) times a week. He takes on Tuesdays and Thursdays.   Yes Historical Provider, MD   BP 121/86  Pulse 85  Temp(Src) 98.3 F (36.8 C) (Oral)  Resp 20  SpO2 100% Physical Exam  Nursing note and vitals reviewed. Constitutional: He appears well-developed and well-nourished. No distress.  HENT:  Head: Normocephalic and atraumatic.  Eyes: Pupils are equal, round, and reactive to light.  Neck: Normal range of motion. Neck supple.  Cardiovascular: Normal rate and regular rhythm.   Pulmonary/Chest: Effort normal.  Neurological: He is alert.  Skin: Skin is warm and dry.  Psychiatric: His affect is angry and inappropriate. His speech is rapid and/or pressured. He is agitated, aggressive and combative. He expresses impulsivity and inappropriate judgment. He expresses homicidal and suicidal ideation.    ED Course  Procedures (including critical care time) Labs Review Labs Reviewed   COMPREHENSIVE METABOLIC PANEL - Abnormal; Notable for the following:    Glucose, Bld 153 (*)    Creatinine, Ser 1.81 (*)    GFR calc non Af Amer 41 (*)    GFR calc Af Amer 47 (*)    All other components within normal limits  SALICYLATE LEVEL - Abnormal; Notable for the following:    Salicylate Lvl <4.6 (*)    All other components within normal limits  ACETAMINOPHEN LEVEL  CBC  ETHANOL  URINE RAPID DRUG SCREEN (HOSP PERFORMED)    Imaging Review No results found.   EKG Interpretation None      MDM   Final diagnoses:  Aggressive behavior of adult  Mental retardation  Schizoaffective disorder, unspecified type    Screening labs ordered. Holding orders placed. Home meds reviewed TTS consult ordered.   Filed Vitals:   10/13/13 0141  BP: 121/86  Pulse: 85  Temp:   Resp: [address], PA-C 10/13/13 [phone]

## 2013-10-13 NOTE — ED Notes (Signed)
Pt ate 75% of his meal

## 2013-10-13 NOTE — ED Provider Notes (Signed)
Medical screening examination/treatment/procedure(s) were performed by non-physician practitioner and as supervising physician I was immediately available for consultation/collaboration.   EKG Interpretation None       Varney Biles, MD 10/13/13 [phone]

## 2013-10-13 NOTE — ED Notes (Signed)
Pt provided UDS but lab called and stated they need another specimen.

## 2013-10-13 NOTE — ED Notes (Signed)
Pt ate 100%.

## 2013-10-13 NOTE — ED Notes (Signed)
Pt ate 100% of his lunch

## 2013-10-13 NOTE — ED Notes (Signed)
Pt. Is eating.

## 2013-10-13 NOTE — Consult Note (Addendum)
[facility] Consult   Reason for Consult:  Agitation  Referring Physician:  EDP  Shawn Baird is an 53 y.o. male. Total Time spent with patient: 20 minutes  Assessment: AXIS I:  Schizoaffective Disorder AXIS II:  Deferred AXIS III:   Past Medical History  Diagnosis Date  . Diabetes mellitus   . Anxiety   . Hypertension   . Hyperlipemia   . Mental disorder   . Schizoaffective disorder   . Impulsive control disorder  . Pancreatitis   . Otitis media   . Cancer   . HOH (hard of hearing)   . Tourette's disease   . Mental retardation   . Chronic constipation    AXIS IV:  other psychosocial or environmental problems, problems related to social environment and problems with primary support group AXIS V:  21-30 behavior considerably influenced by delusions or hallucinations OR serious impairment in judgment, communication OR inability to function in almost all areas  Plan:  Recommend psychiatric Inpatient admission when medically cleared.  Subjective:   Shawn Baird is a 53 y.o. male patient admitted with schizoaffective disorder exacerbation.  HPI:  The patient has been refusing to take his medications and is now threatening to commit suicide, to kill others, cursing, shouting and acting aggressive and hostile. He has needed to be admitted to behavior health in the past. He babbles and continues to threaten staff and [employer] that he will kill them if we give him a gun.   HPI Elements:   Location:  generalized. Quality:  acute. Severity:  severe. Timing:  constant. Duration:  few days. Context:  stressors, stopped taking medications.  Past Psychiatric History: Past Medical History  Diagnosis Date  . Diabetes mellitus   . Anxiety   . Hypertension   . Hyperlipemia   . Mental disorder   . Schizoaffective disorder   . Impulsive control disorder  . Pancreatitis   . Otitis media   . Cancer   . HOH (hard of hearing)   . Tourette's disease   . Mental  retardation   . Chronic constipation     reports that he has never smoked. He has never used smokeless tobacco. He reports that he does not drink alcohol or use illicit drugs. Family History  Problem Relation Age of Onset  . High blood pressure Sister   . High Cholesterol Sister   . Heart disease Mother   . Stroke Mother            Allergies:  No Known Allergies  ACT Assessment Complete:  Yes:    Educational Status    Risk to Self: Risk to self with the past 6 months Is patient at risk for suicide?: Yes Substance abuse history and/or treatment for substance abuse?: No  Risk to Others:    Abuse:    Prior Inpatient Therapy:    Prior Outpatient Therapy:    Additional Information:                    Objective: Blood pressure 136/95, pulse 70, temperature 98.5 F (36.9 C), temperature source Oral, resp. rate 20, SpO2 97.00%.There is no weight on file to calculate BMI. Results for orders placed during the hospital encounter of 10/13/13 (from the past 72 hour(s))  ACETAMINOPHEN LEVEL     Status: None   Collection Time    10/13/13  2:34 AM      Result Value Ref Range   Acetaminophen (Tylenol), Serum <15.0  10 - 30 ug/mL  Comment:            THERAPEUTIC CONCENTRATIONS VARY     SIGNIFICANTLY. A RANGE OF 10-30     ug/mL MAY BE AN EFFECTIVE     CONCENTRATION FOR MANY PATIENTS.     HOWEVER, SOME ARE BEST TREATED     AT CONCENTRATIONS OUTSIDE THIS     RANGE.     ACETAMINOPHEN CONCENTRATIONS     >150 ug/mL AT 4 HOURS AFTER     INGESTION AND >50 ug/mL AT 12     HOURS AFTER INGESTION ARE     OFTEN ASSOCIATED WITH TOXIC     REACTIONS.  CBC     Status: None   Collection Time    10/13/13  2:34 AM      Result Value Ref Range   WBC 6.8  4.0 - 10.5 K/uL   RBC 4.65  4.22 - 5.81 MIL/uL   Hemoglobin 14.2  13.0 - 17.0 g/dL   HCT 41.6  39.0 - 52.0 %   MCV 89.5  78.0 - 100.0 fL   MCH 30.5  26.0 - 34.0 pg   MCHC 34.1  30.0 - 36.0 g/dL   RDW 13.1  11.5 - 15.5 %    Platelets 308  150 - 400 K/uL  COMPREHENSIVE METABOLIC PANEL     Status: Abnormal   Collection Time    10/13/13  2:34 AM      Result Value Ref Range   Sodium 142  137 - 147 mEq/L   Potassium 3.9  3.7 - 5.3 mEq/L   Chloride 104  96 - 112 mEq/L   CO2 23  19 - 32 mEq/L   Glucose, Bld 153 (*) 70 - 99 mg/dL   BUN 11  6 - 23 mg/dL   Creatinine, Ser 1.81 (*) 0.50 - 1.35 mg/dL   Calcium 10.0  8.4 - 10.5 mg/dL   Total Protein 8.3  6.0 - 8.3 g/dL   Albumin 4.2  3.5 - 5.2 g/dL   AST 19  0 - 37 U/L   ALT 20  0 - 53 U/L   Alkaline Phosphatase 78  39 - 117 U/L   Total Bilirubin 0.4  0.3 - 1.2 mg/dL   GFR calc non Af Amer 41 (*) >90 mL/min   GFR calc Af Amer 47 (*) >90 mL/min   Comment: (NOTE)     The eGFR has been calculated using the CKD EPI equation.     This calculation has not been validated in all clinical situations.     eGFR's persistently <90 mL/min signify possible Chronic Kidney     Disease.   Anion gap 15  5 - 15  ETHANOL     Status: None   Collection Time    10/13/13  2:34 AM      Result Value Ref Range   Alcohol, Ethyl (B) <11  0 - 11 mg/dL   Comment:            LOWEST DETECTABLE LIMIT FOR     SERUM ALCOHOL IS 11 mg/dL     FOR MEDICAL PURPOSES ONLY  SALICYLATE LEVEL     Status: Abnormal   Collection Time    10/13/13  2:34 AM      Result Value Ref Range   Salicylate Lvl <3.0 (*) 2.8 - 20.0 mg/dL   Labs are reviewed and are pertinent for no medical issues noted.  Current Facility-Administered Medications  Medication Dose Route Frequency Provider Last Rate Last Dose  .  acetaminophen (TYLENOL) tablet 650 mg  650 mg Oral Q4H PRN Linus Mako, PA-C      . alum & mag hydroxide-simeth (MAALOX/MYLANTA) 200-200-20 MG/5ML suspension 30 mL  30 mL Oral PRN Linus Mako, PA-C      . amLODipine (NORVASC) tablet 5 mg  5 mg Oral q morning - 10a Tiffany G Greene, PA-C      . aspirin EC tablet 81 mg  81 mg Oral q morning - 10a Tiffany G Greene, PA-C      . buPROPion (WELLBUTRIN  XL) 24 hr tablet 300 mg  300 mg Oral q morning - 10a Tiffany G Greene, PA-C      . clonazePAM (KLONOPIN) tablet 0.5 mg  0.5 mg Oral Q12H PRN Linus Mako, PA-C      . dicyclomine (BENTYL) tablet 20 mg  20 mg Oral QID Linus Mako, PA-C   20 mg at 10/13/13 1410  . divalproex (DEPAKOTE ER) 24 hr tablet 500 mg  500 mg Oral BID Tiffany G Greene, PA-C      . fenofibrate tablet 54 mg  54 mg Oral Daily Tiffany Marilu Favre, PA-C      . gabapentin (NEURONTIN) capsule 300 mg  300 mg Oral QHS Tiffany Marilu Favre, PA-C      . glipiZIDE (GLUCOTROL XL) 24 hr tablet 2.5 mg  2.5 mg Oral Q breakfast Linus Mako, PA-C   2.5 mg at 10/13/13 5188  . hydrOXYzine (ATARAX/VISTARIL) tablet 25 mg  25 mg Oral QID PRN Linus Mako, PA-C      . ibuprofen (ADVIL,MOTRIN) tablet 600 mg  600 mg Oral Q8H PRN Linus Mako, PA-C      . loratadine (CLARITIN) tablet 10 mg  10 mg Oral q morning - 10a Tiffany Marilu Favre, PA-C      . LORazepam (ATIVAN) tablet 1 mg  1 mg Oral Q8H PRN Linus Mako, PA-C   1 mg at 10/13/13 1241  . lubiprostone (AMITIZA) capsule 24 mcg  24 mcg Oral BID Linus Mako, PA-C      . metoCLOPramide (REGLAN) tablet 10 mg  10 mg Oral TID AC & HS Linus Mako, PA-C   10 mg at 10/13/13 1229  . metoprolol tartrate (LOPRESSOR) tablet 12.5 mg  12.5 mg Oral BID Linus Mako, PA-C   12.5 mg at 10/13/13 1243  . ondansetron (ZOFRAN) tablet 4 mg  4 mg Oral Q8H PRN Linus Mako, PA-C      . ondansetron (ZOFRAN) tablet 4 mg  4 mg Oral Q6H PRN Linus Mako, PA-C      . pantoprazole (PROTONIX) EC tablet 40 mg  40 mg Oral Daily Linus Mako, PA-C   40 mg at 10/13/13 1243  . polyethylene glycol (MIRALAX / GLYCOLAX) packet 17 g  17 g Oral q morning - 10a Tiffany G Greene, PA-C      . QUEtiapine (SEROQUEL) tablet 100 mg  100 mg Oral BID Linus Mako, PA-C   100 mg at 10/13/13 1242  . QUEtiapine (SEROQUEL) tablet 100 mg  100 mg Oral Q12H PRN Linus Mako, PA-C      . QUEtiapine  (SEROQUEL) tablet 600 mg  600 mg Oral QHS Tiffany Marilu Favre, PA-C      . simvastatin (ZOCOR) tablet 20 mg  20 mg Oral q1800 Tiffany G Greene, PA-C      . tiZANidine (ZANAFLEX) tablet 4 mg  4 mg Oral Q6H PRN Tiffany  Marilu Favre, PA-C      . ziprasidone (GEODON) injection 20 mg  20 mg Intramuscular Once Tiffany G Greene, PA-C      . zolpidem (AMBIEN) tablet 5 mg  5 mg Oral QHS PRN Linus Mako, PA-C       Current Outpatient Prescriptions  Medication Sig Dispense Refill  . amLODipine (NORVASC) 5 MG tablet Take 5 mg by mouth every morning.       Marland Kitchen aspirin EC 81 MG tablet Take 81 mg by mouth every morning.       Marland Kitchen buPROPion (WELLBUTRIN XL) 300 MG 24 hr tablet Take 300 mg by mouth every morning.      . clonazePAM (KLONOPIN) 0.5 MG tablet Take 0.5 mg by mouth every 12 (twelve) hours as needed (For agitation.).      Marland Kitchen dicyclomine (BENTYL) 20 MG tablet Take 20 mg by mouth 4 (four) times daily.       . divalproex (DEPAKOTE ER) 500 MG 24 hr tablet Take 500 mg by mouth 2 (two) times daily.      . fenofibrate 54 MG tablet Take 54 mg by mouth daily.      Marland Kitchen gabapentin (NEURONTIN) 300 MG capsule Take 300 mg by mouth at bedtime.      Marland Kitchen glipiZIDE (GLUCOTROL XL) 2.5 MG 24 hr tablet Take 2.5 mg by mouth daily with breakfast.      . hydrOXYzine (ATARAX/VISTARIL) 25 MG tablet Take 25 mg by mouth 4 (four) times daily as needed for anxiety.      Marland Kitchen loratadine (CLARITIN) 10 MG tablet Take 10 mg by mouth every morning.       . lubiprostone (AMITIZA) 24 MCG capsule Take 24 mcg by mouth 2 (two) times daily.      . metoCLOPramide (REGLAN) 10 MG tablet Take 10 mg by mouth 4 (four) times daily -  before meals and at bedtime.       . metoprolol tartrate (LOPRESSOR) 25 MG tablet Take 12.5 mg by mouth 2 (two) times daily.      Marland Kitchen omeprazole (PRILOSEC) 20 MG capsule Take 20 mg by mouth every morning.       . ondansetron (ZOFRAN) 4 MG tablet Take 4 mg by mouth every 6 (six) hours as needed for nausea.      . polyethylene glycol  (MIRALAX / GLYCOLAX) packet Take 17 g by mouth every morning.       . pravastatin (PRAVACHOL) 20 MG tablet Take 20 mg by mouth daily.      . QUEtiapine (SEROQUEL) 100 MG tablet Take 100 mg by mouth 2 (two) times daily.      . QUEtiapine (SEROQUEL) 200 MG tablet Take 600 mg by mouth at bedtime.      Marland Kitchen QUEtiapine (SEROQUEL) 50 MG tablet Take 100 mg by mouth every 12 (twelve) hours as needed (For agitation.).      Marland Kitchen testosterone cypionate (DEPOTESTOTERONE CYPIONATE) 100 MG/ML injection Inject 100 mg into the muscle every 28 (twenty-eight) days. For IM use only      . tiZANidine (ZANAFLEX) 4 MG tablet Take 4 mg by mouth every 6 (six) hours as needed for muscle spasms.      . traMADol (ULTRAM) 50 MG tablet Take by mouth 3 (three) times daily as needed (For abdominal pain.).      Marland Kitchen vitamin B-12 (CYANOCOBALAMIN) 1000 MCG tablet Take 1,000 mcg by mouth every morning.       . Vitamin D, Ergocalciferol, (DRISDOL) 50000 UNITS CAPS  capsule Take 50,000 Units by mouth 2 (two) times a week. He takes on Tuesdays and Thursdays.        Psychiatric Specialty Exam:     Blood pressure 136/95, pulse 70, temperature 98.5 F (36.9 C), temperature source Oral, resp. rate 20, SpO2 97.00%.There is no weight on file to calculate BMI.  General Appearance: Disheveled  Eye Sport and exercise psychologist::  Fair  Speech:  Normal Rate  Volume:  Increased  Mood:  Angry and Irritable  Affect:  Congruent  Thought Process:  Disorganized   Orientation:  Full (Time, Place, and Person)  Thought Content:  Delusions, Hallucinations: Auditory Visual and Paranoid Ideation  Suicidal Thoughts:  Yes with intent and plan  Homicidal Thoughts:  Yes with intent and plan  Memory:  Immediate;   Poor Recent;   Poor Remote;   Poor  Judgement:  Impaired  Insight:  Lacking  Psychomotor Activity:  Increased  Concentration:  Fair  Recall:  Poor  Fund of Knowledge:Fair  Language: Fair  Akathisia:  No  Handed:  Right  AIMS (if indicated):     Assets:   Leisure Time Physical Health Resilience  Sleep:      Musculoskeletal: Strength & Muscle Tone: within normal limits Gait & Station: normal Patient leans: N/A  Treatment Plan Summary: Daily contact with patient to assess and evaluate symptoms and progress in treatment Medication management; admit to inpatient psychiatry for stabilization  Waylan Boga, PMH-NP 10/13/2013 5:11 PM  Patient seen, evaluated and I agree with notes by Nurse Practitioner. Corena Pilgrim, MD

## 2013-10-13 NOTE — ED Notes (Signed)
Patient is cursing and shouting  Aggressive talking to himself at this time.

## 2013-10-13 NOTE — ED Notes (Signed)
Pt refusing to take medications. Pt sts "I don't want to talk to you" when RN attempts to assess and give medications. Pt sts "I will throw my medications in the trash can if you bring them near me."

## 2013-10-13 NOTE — ED Notes (Signed)
Pt not cooperating with blood draw.  Notified nurse.

## 2013-10-13 NOTE — ED Notes (Signed)
Pt brought in under IVC by [employer]  Pt is from a group home and the supervisor took out IVC papers on him  Paperwork states the pt has MR and schizophrenia  The respondent refuses to take his medications  The respondent refuses to take hes medication  The respondent is cursing, shouting and threatening to commit suicide  Pt does not have a plan  Pt has been talking to himself  Pt is hostile and aggressive towards staff  Pt has been committed to [facility] in the past

## 2013-10-13 NOTE — ED Notes (Signed)
Pt refuses to talk to this recorder as he states he does not like white folk and he does not like me

## 2013-10-14 ENCOUNTER — Encounter (HOSPITAL_COMMUNITY): Payer: Self-pay | Admitting: Psychiatry

## 2013-10-14 DIAGNOSIS — F259 Schizoaffective disorder, unspecified: Secondary | ICD-10-CM | POA: Diagnosis not present

## 2013-10-14 MED ORDER — POLYETHYLENE GLYCOL 3350 17 G PO PACK: 17.0000 g | PACK | Freq: Every morning | ORAL | Status: DC

## 2013-10-14 MED ORDER — OMEPRAZOLE 20 MG PO CPDR: 20.0000 mg | DELAYED_RELEASE_CAPSULE | Freq: Every morning | ORAL | Status: DC

## 2013-10-14 MED ORDER — PRAVASTATIN SODIUM 20 MG PO TABS: 20.0000 mg | ORAL_TABLET | Freq: Every day | ORAL | Status: DC

## 2013-10-14 MED ORDER — QUETIAPINE FUMARATE 100 MG PO TABS: 100.0000 mg | ORAL_TABLET | Freq: Two times a day (BID) | ORAL | Status: DC

## 2013-10-14 MED ORDER — LORATADINE 10 MG PO TABS: 10.0000 mg | ORAL_TABLET | Freq: Every morning | ORAL | Status: DC

## 2013-10-14 MED ORDER — LUBIPROSTONE 24 MCG PO CAPS: 24.0000 ug | ORAL_CAPSULE | Freq: Two times a day (BID) | ORAL | Status: DC

## 2013-10-14 MED ORDER — BUPROPION HCL ER (XL) 300 MG PO TB24: 300.0000 mg | ORAL_TABLET | Freq: Every morning | ORAL | Status: DC

## 2013-10-14 MED ORDER — ASPIRIN EC 81 MG PO TBEC: 81.0000 mg | DELAYED_RELEASE_TABLET | Freq: Every morning | ORAL | Status: AC

## 2013-10-14 MED ORDER — METOCLOPRAMIDE HCL 10 MG PO TABS: 10.0000 mg | ORAL_TABLET | Freq: Three times a day (TID) | ORAL | Status: DC

## 2013-10-14 MED ORDER — VITAMIN B-12 1000 MCG PO TABS: 1000.0000 ug | ORAL_TABLET | Freq: Every morning | ORAL | Status: DC

## 2013-10-14 MED ORDER — DIVALPROEX SODIUM ER 500 MG PO TB24: 500.0000 mg | ORAL_TABLET | Freq: Two times a day (BID) | ORAL | Status: DC

## 2013-10-14 MED ORDER — QUETIAPINE FUMARATE 200 MG PO TABS: 600.0000 mg | ORAL_TABLET | Freq: Every day | ORAL | Status: DC

## 2013-10-14 MED ORDER — FENOFIBRATE 54 MG PO TABS: 54.0000 mg | ORAL_TABLET | Freq: Every day | ORAL | Status: DC

## 2013-10-14 MED ORDER — GABAPENTIN 300 MG PO CAPS
300.0000 mg | ORAL_CAPSULE | Freq: Every day | ORAL | Status: DC
Start: 2013-10-14 — End: 2022-07-20

## 2013-10-14 MED ORDER — VITAMIN D (ERGOCALCIFEROL) 1.25 MG (50000 UNIT) PO CAPS: 50000.0000 [IU] | ORAL_CAPSULE | ORAL | Status: DC

## 2013-10-14 MED ORDER — GLIPIZIDE ER 2.5 MG PO TB24: 2.5000 mg | ORAL_TABLET | Freq: Every day | ORAL | Status: DC

## 2013-10-14 MED ORDER — AMLODIPINE BESYLATE 5 MG PO TABS: 5.0000 mg | ORAL_TABLET | Freq: Every morning | ORAL | Status: AC

## 2013-10-14 MED ORDER — METOPROLOL TARTRATE 25 MG PO TABS: 12.5000 mg | ORAL_TABLET | Freq: Two times a day (BID) | ORAL | Status: DC

## 2013-10-14 MED ORDER — DICYCLOMINE HCL 20 MG PO TABS: 20.0000 mg | ORAL_TABLET | Freq: Four times a day (QID) | ORAL | Status: DC

## 2013-10-14 MED ORDER — CLONAZEPAM 0.5 MG PO TABS: 0.5000 mg | ORAL_TABLET | Freq: Two times a day (BID) | ORAL | Status: DC | PRN

## 2013-10-14 MED ORDER — QUETIAPINE FUMARATE 200 MG PO TABS
600.0000 mg | ORAL_TABLET | Freq: Every day | ORAL | Status: DC
Start: 2013-10-14 — End: 2013-10-14

## 2013-10-14 NOTE — ED Notes (Signed)
Pt ate 100% of breakfast and lunch 

## 2013-10-14 NOTE — ED Notes (Signed)
Mr Freel has two belongings bag and they were placed in [place] [facility] by [facility] RN

## 2013-10-14 NOTE — [facility] Suicide Risk Assessment (Signed)
Suicide Risk Assessment  Discharge Assessment     Demographic Factors:  Male  Total Time spent with patient: 20 minutes  Psychiatric Specialty Exam:     Blood pressure 127/85, pulse 81, temperature 98.2 F (36.8 C), temperature source Oral, resp. rate 18, SpO2 100.00%.There is no weight on file to calculate BMI.  General Appearance: Casual  Eye Contact::  Fair  Speech:  Normal Rate  Volume:  NOrmal  Mood:  Calm and cooperative, euthymic  Affect:  Congruent  Thought Process:  Clear   Orientation:  Full (Time, Place, and Person)  Thought Content:  Coherent   Suicidal Thoughts:  No  Homicidal Thoughts:  No  Memory:  Fair  Judgement:  Fair  Insight:  Lacking  Psychomotor Activity:  Normal  Concentration:  Fair  Recall:  [employer] of Knowledge:Fair  Language: Fair  Akathisia:  No  Handed:  Right  AIMS (if indicated):     Assets:  Leisure Time Physical Health Resilience  Sleep:      Musculoskeletal: Strength & Muscle Tone: within normal limits Gait & Station: normal Patient leans: N/A  Mental Status Per Nursing Assessment::   On Admission:   agitation  Current Mental Status by Physician: NA  Loss Factors: NA  Historical Factors: NA  Risk Reduction Factors:   Living with another person, especially a relative, Positive social support and Positive therapeutic relationship  Continued Clinical Symptoms:  None   Cognitive Features That Contribute To Risk:  Limited intellectually, DD diagnosis  Suicide Risk:  Minimal: No identifiable suicidal ideation.  Patients presenting with no risk factors but with morbid ruminations; may be classified as minimal risk based on the severity of the depressive symptoms  Discharge Diagnoses:   AXIS I:  Schizoaffective Disorder AXIS II:  Deferred AXIS III:   Past Medical History  Diagnosis Date  . Diabetes mellitus   . Anxiety   . Hypertension   . Hyperlipemia   . Mental disorder   . Schizoaffective disorder   .  Impulsive control disorder  . Pancreatitis   . Otitis media   . Cancer   . HOH (hard of hearing)   . Tourette's disease   . Mental retardation   . Chronic constipation    AXIS IV:  other psychosocial or environmental problems, problems related to social environment and problems with primary support group AXIS V:  61-70 mild symptoms  Plan Of Care/Follow-up recommendations:  Activity:  as tolerated Diet:  low-sodium heart healthy diet  Is patient on multiple antipsychotic therapies at discharge:  No   Has Patient had three or more failed trials of antipsychotic monotherapy by history:  No  Recommended Plan for Multiple Antipsychotic Therapies: NA    , , PMH-NP 10/14/2013, 1:07 PM

## 2013-10-14 NOTE — ED Notes (Signed)
Group home made aware that pt was ready for discharged. Stated that it would be a couple of hours before they could come. Charge made aware.

## 2013-10-14 NOTE — Consult Note (Signed)
[facility] Consult   Reason for Consult:  Agitation  Referring Physician:  EDP  Shawn Baird is an 53 y.o. male. Total Time spent with patient: 20 minutes  Assessment: AXIS I:  Schizoaffective Disorder AXIS II:  Deferred AXIS III:   Past Medical History  Diagnosis Date  . Diabetes mellitus   . Anxiety   . Hypertension   . Hyperlipemia   . Mental disorder   . Schizoaffective disorder   . Impulsive control disorder  . Pancreatitis   . Otitis media   . Cancer   . HOH (hard of hearing)   . Tourette's disease   . Mental retardation   . Chronic constipation    AXIS IV:  other psychosocial or environmental problems, problems related to social environment and problems with primary support group AXIS V:   70; mild symptoms  Plan:  Discharge to his group home.  Dr. Parke Poisson has assessed the patient and concurs with the plan. Subjective:   Shawn Baird is a 53 y.o. male patient does not warrant admission.  HPI:  Patient is calm and cooperative; has been since last night.  The group home request liquid medications since he does not want to take his medications. No signs or symptoms of agitations, denies suicidal/homicidal ideations and hallucinations; limited intellectually.  HPI Elements:   Location:  generalized. Quality:  acute. Severity:  severe. Timing:  constant. Duration:  few days. Context:  stressors, stopped taking medications.  Past Psychiatric History: Past Medical History  Diagnosis Date  . Diabetes mellitus   . Anxiety   . Hypertension   . Hyperlipemia   . Mental disorder   . Schizoaffective disorder   . Impulsive control disorder  . Pancreatitis   . Otitis media   . Cancer   . HOH (hard of hearing)   . Tourette's disease   . Mental retardation   . Chronic constipation     reports that he has never smoked. He has never used smokeless tobacco. He reports that he does not drink alcohol or use illicit drugs. Family History  Problem  Relation Age of Onset  . High blood pressure Sister   . High Cholesterol Sister   . Heart disease Mother   . Stroke Mother            Allergies:  No Known Allergies  ACT Assessment Complete:  Yes:    Educational Status    Risk to Self: Risk to self with the past 6 months Is patient at risk for suicide?: Yes Substance abuse history and/or treatment for substance abuse?: No  Risk to Others:    Abuse:    Prior Inpatient Therapy:    Prior Outpatient Therapy:    Additional Information:                    Objective: Blood pressure 127/85, pulse 81, temperature 98.2 F (36.8 C), temperature source Oral, resp. rate 18, SpO2 100.00%.There is no weight on file to calculate BMI. Results for orders placed during the hospital encounter of 10/13/13 (from the past 72 hour(s))  ACETAMINOPHEN LEVEL     Status: None   Collection Time    10/13/13  2:34 AM      Result Value Ref Range   Acetaminophen (Tylenol), Serum <15.0  10 - 30 ug/mL   Comment:            THERAPEUTIC CONCENTRATIONS VARY     SIGNIFICANTLY. A RANGE OF 10-30  ug/mL MAY BE AN EFFECTIVE     CONCENTRATION FOR MANY PATIENTS.     HOWEVER, SOME ARE BEST TREATED     AT CONCENTRATIONS OUTSIDE THIS     RANGE.     ACETAMINOPHEN CONCENTRATIONS     >150 ug/mL AT 4 HOURS AFTER     INGESTION AND >50 ug/mL AT 12     HOURS AFTER INGESTION ARE     OFTEN ASSOCIATED WITH TOXIC     REACTIONS.  CBC     Status: None   Collection Time    10/13/13  2:34 AM      Result Value Ref Range   WBC 6.8  4.0 - 10.5 K/uL   RBC 4.65  4.22 - 5.81 MIL/uL   Hemoglobin 14.2  13.0 - 17.0 g/dL   HCT 41.6  39.0 - 52.0 %   MCV 89.5  78.0 - 100.0 fL   MCH 30.5  26.0 - 34.0 pg   MCHC 34.1  30.0 - 36.0 g/dL   RDW 13.1  11.5 - 15.5 %   Platelets 308  150 - 400 K/uL  COMPREHENSIVE METABOLIC PANEL     Status: Abnormal   Collection Time    10/13/13  2:34 AM      Result Value Ref Range   Sodium 142  137 - 147 mEq/L   Potassium 3.9  3.7 -  5.3 mEq/L   Chloride 104  96 - 112 mEq/L   CO2 23  19 - 32 mEq/L   Glucose, Bld 153 (*) 70 - 99 mg/dL   BUN 11  6 - 23 mg/dL   Creatinine, Ser 1.81 (*) 0.50 - 1.35 mg/dL   Calcium 10.0  8.4 - 10.5 mg/dL   Total Protein 8.3  6.0 - 8.3 g/dL   Albumin 4.2  3.5 - 5.2 g/dL   AST 19  0 - 37 U/L   ALT 20  0 - 53 U/L   Alkaline Phosphatase 78  39 - 117 U/L   Total Bilirubin 0.4  0.3 - 1.2 mg/dL   GFR calc non Af Amer 41 (*) >90 mL/min   GFR calc Af Amer 47 (*) >90 mL/min   Comment: (NOTE)     The eGFR has been calculated using the CKD EPI equation.     This calculation has not been validated in all clinical situations.     eGFR's persistently <90 mL/min signify possible Chronic Kidney     Disease.   Anion gap 15  5 - 15  ETHANOL     Status: None   Collection Time    10/13/13  2:34 AM      Result Value Ref Range   Alcohol, Ethyl (B) <11  0 - 11 mg/dL   Comment:            LOWEST DETECTABLE LIMIT FOR     SERUM ALCOHOL IS 11 mg/dL     FOR MEDICAL PURPOSES ONLY  SALICYLATE LEVEL     Status: Abnormal   Collection Time    10/13/13  2:34 AM      Result Value Ref Range   Salicylate Lvl <2.2 (*) 2.8 - 20.0 mg/dL  URINE RAPID DRUG SCREEN (HOSP PERFORMED)     Status: None   Collection Time    10/13/13  5:32 PM      Result Value Ref Range   Opiates NONE DETECTED  NONE DETECTED   Cocaine NONE DETECTED  NONE DETECTED   Benzodiazepines NONE DETECTED  NONE DETECTED   Amphetamines NONE DETECTED  NONE DETECTED   Tetrahydrocannabinol NONE DETECTED  NONE DETECTED   Barbiturates NONE DETECTED  NONE DETECTED   Comment:            DRUG SCREEN FOR MEDICAL PURPOSES     ONLY.  IF CONFIRMATION IS NEEDED     FOR ANY PURPOSE, NOTIFY LAB     WITHIN 5 DAYS.                LOWEST DETECTABLE LIMITS     FOR URINE DRUG SCREEN     Drug Class       Cutoff (ng/mL)     Amphetamine      1000     Barbiturate      200     Benzodiazepine   774     Tricyclics       128     Opiates          300     Cocaine           300     THC              50   Labs are reviewed and are pertinent for no medical issues noted.  Current Facility-Administered Medications  Medication Dose Route Frequency Provider Last Rate Last Dose  . acetaminophen (TYLENOL) tablet 650 mg  650 mg Oral Q4H PRN Linus Mako, PA-C      . alum & mag hydroxide-simeth (MAALOX/MYLANTA) 200-200-20 MG/5ML suspension 30 mL  30 mL Oral PRN Linus Mako, PA-C      . amLODipine (NORVASC) tablet 5 mg  5 mg Oral q morning - 10a Tiffany G Greene, PA-C      . aspirin EC tablet 81 mg  81 mg Oral q morning - 10a Tiffany G Greene, PA-C      . buPROPion (WELLBUTRIN XL) 24 hr tablet 300 mg  300 mg Oral q morning - 10a Tiffany G Greene, PA-C      . clonazePAM (KLONOPIN) tablet 0.5 mg  0.5 mg Oral Q12H PRN Linus Mako, PA-C      . dicyclomine (BENTYL) tablet 20 mg  20 mg Oral QID Linus Mako, PA-C   20 mg at 10/13/13 1410  . divalproex (DEPAKOTE ER) 24 hr tablet 500 mg  500 mg Oral BID Tiffany G Greene, PA-C      . fenofibrate tablet 54 mg  54 mg Oral Daily Tiffany Marilu Favre, PA-C      . gabapentin (NEURONTIN) capsule 300 mg  300 mg Oral QHS Linus Mako, PA-C   300 mg at 10/13/13 2025  . glipiZIDE (GLUCOTROL XL) 24 hr tablet 2.5 mg  2.5 mg Oral Q breakfast Linus Mako, PA-C   2.5 mg at 10/13/13 7867  . hydrOXYzine (ATARAX/VISTARIL) tablet 25 mg  25 mg Oral QID PRN Linus Mako, PA-C      . ibuprofen (ADVIL,MOTRIN) tablet 600 mg  600 mg Oral Q8H PRN Linus Mako, PA-C      . loratadine (CLARITIN) tablet 10 mg  10 mg Oral q morning - 10a Tiffany Marilu Favre, PA-C      . LORazepam (ATIVAN) tablet 1 mg  1 mg Oral Q8H PRN Linus Mako, PA-C   1 mg at 10/13/13 1241  . lubiprostone (AMITIZA) capsule 24 mcg  24 mcg Oral BID Linus Mako, PA-C      . metoCLOPramide (REGLAN) tablet 10 mg  10 mg Oral TID AC & HS Linus Mako, PA-C   10 mg at 10/13/13 1229  . metoprolol tartrate (LOPRESSOR) tablet 12.5 mg  12.5 mg Oral BID  Linus Mako, PA-C   12.5 mg at 10/13/13 2026  . ondansetron (ZOFRAN) tablet 4 mg  4 mg Oral Q8H PRN Linus Mako, PA-C      . ondansetron (ZOFRAN) tablet 4 mg  4 mg Oral Q6H PRN Linus Mako, PA-C      . pantoprazole (PROTONIX) EC tablet 40 mg  40 mg Oral Daily Linus Mako, PA-C   40 mg at 10/13/13 1243  . polyethylene glycol (MIRALAX / GLYCOLAX) packet 17 g  17 g Oral q morning - 10a Tiffany G Greene, PA-C      . QUEtiapine (SEROQUEL) tablet 100 mg  100 mg Oral BID Linus Mako, PA-C   100 mg at 10/13/13 1242  . QUEtiapine (SEROQUEL) tablet 100 mg  100 mg Oral Q12H PRN Linus Mako, PA-C      . QUEtiapine (SEROQUEL) tablet 600 mg  600 mg Oral QHS Linus Mako, PA-C   600 mg at 10/13/13 2025  . simvastatin (ZOCOR) tablet 20 mg  20 mg Oral q1800 Tiffany G Greene, PA-C      . tiZANidine (ZANAFLEX) tablet 4 mg  4 mg Oral Q6H PRN Linus Mako, PA-C      . ziprasidone (GEODON) injection 20 mg  20 mg Intramuscular Once Tiffany G Greene, PA-C      . zolpidem (AMBIEN) tablet 5 mg  5 mg Oral QHS PRN Linus Mako, PA-C       Current Outpatient Prescriptions  Medication Sig Dispense Refill  . amLODipine (NORVASC) 5 MG tablet Take 5 mg by mouth every morning.       Marland Kitchen aspirin EC 81 MG tablet Take 81 mg by mouth every morning.       Marland Kitchen buPROPion (WELLBUTRIN XL) 300 MG 24 hr tablet Take 300 mg by mouth every morning.      . clonazePAM (KLONOPIN) 0.5 MG tablet Take 0.5 mg by mouth every 12 (twelve) hours as needed (For agitation.).      Marland Kitchen dicyclomine (BENTYL) 20 MG tablet Take 20 mg by mouth 4 (four) times daily.       . divalproex (DEPAKOTE ER) 500 MG 24 hr tablet Take 500 mg by mouth 2 (two) times daily.      . fenofibrate 54 MG tablet Take 54 mg by mouth daily.      Marland Kitchen gabapentin (NEURONTIN) 300 MG capsule Take 300 mg by mouth at bedtime.      Marland Kitchen glipiZIDE (GLUCOTROL XL) 2.5 MG 24 hr tablet Take 2.5 mg by mouth daily with breakfast.      . hydrOXYzine (ATARAX/VISTARIL)  25 MG tablet Take 25 mg by mouth 4 (four) times daily as needed for anxiety.      Marland Kitchen loratadine (CLARITIN) 10 MG tablet Take 10 mg by mouth every morning.       . lubiprostone (AMITIZA) 24 MCG capsule Take 24 mcg by mouth 2 (two) times daily.      . metoCLOPramide (REGLAN) 10 MG tablet Take 10 mg by mouth 4 (four) times daily -  before meals and at bedtime.       . metoprolol tartrate (LOPRESSOR) 25 MG tablet Take 12.5 mg by mouth 2 (two) times daily.      Marland Kitchen omeprazole (PRILOSEC) 20 MG capsule Take 20 mg  by mouth every morning.       . ondansetron (ZOFRAN) 4 MG tablet Take 4 mg by mouth every 6 (six) hours as needed for nausea.      . polyethylene glycol (MIRALAX / GLYCOLAX) packet Take 17 g by mouth every morning.       . pravastatin (PRAVACHOL) 20 MG tablet Take 20 mg by mouth daily.      . QUEtiapine (SEROQUEL) 100 MG tablet Take 100 mg by mouth 2 (two) times daily.      . QUEtiapine (SEROQUEL) 200 MG tablet Take 600 mg by mouth at bedtime.      Marland Kitchen QUEtiapine (SEROQUEL) 50 MG tablet Take 100 mg by mouth every 12 (twelve) hours as needed (For agitation.).      Marland Kitchen testosterone cypionate (DEPOTESTOTERONE CYPIONATE) 100 MG/ML injection Inject 100 mg into the muscle every 28 (twenty-eight) days. For IM use only      . tiZANidine (ZANAFLEX) 4 MG tablet Take 4 mg by mouth every 6 (six) hours as needed for muscle spasms.      . traMADol (ULTRAM) 50 MG tablet Take by mouth 3 (three) times daily as needed (For abdominal pain.).      Marland Kitchen vitamin B-12 (CYANOCOBALAMIN) 1000 MCG tablet Take 1,000 mcg by mouth every morning.       . Vitamin D, Ergocalciferol, (DRISDOL) 50000 UNITS CAPS capsule Take 50,000 Units by mouth 2 (two) times a week. He takes on Tuesdays and Thursdays.        Psychiatric Specialty Exam:     Blood pressure 127/85, pulse 81, temperature 98.2 F (36.8 C), temperature source Oral, resp. rate 18, SpO2 100.00%.There is no weight on file to calculate BMI.  General Appearance: Casual  Eye  Contact::  Fair  Speech:  Normal Rate  Volume:  NOrmal  Mood:  Calm and cooperative, euthymic  Affect:  Congruent  Thought Process:  Clear   Orientation:  Full (Time, Place, and Person)  Thought Content:  Coherent   Suicidal Thoughts:  No  Homicidal Thoughts:  No  Memory:  Fair  Judgement:  Fair  Insight:  Lacking  Psychomotor Activity:  Normal  Concentration:  Fair  Recall:  [employer] of Knowledge:Fair  Language: Fair  Akathisia:  No  Handed:  Right  AIMS (if indicated):     Assets:  Leisure Time Physical Health Resilience  Sleep:      Musculoskeletal: Strength & Muscle Tone: within normal limits Gait & Station: normal Patient leans: N/A  Treatment Plan Summary: Daily contact with patient to assess and evaluate symptoms and progress in treatment Medication management; discharge to group home with follow-up with his regular provider.  Waylan Boga, [place] 10/14/2013 8:32 AM  Patient seen and evaluated with NP as above

## 2013-10-14 NOTE — ED Notes (Signed)
Group home called, informed that pt was waiting for ride home. Stated that someone was in route to get pt.

## 2014-08-15 ENCOUNTER — Emergency Department (HOSPITAL_COMMUNITY)
Admission: EM | Admit: 2014-08-15 | Discharge: 2014-08-19 | Disposition: A | Discharge status: Home / Self Care | Payer: Medicare Other | Attending: Emergency Medicine | Admitting: Emergency Medicine

## 2014-08-15 ENCOUNTER — Encounter (HOSPITAL_COMMUNITY): Payer: Self-pay | Admitting: Emergency Medicine

## 2014-08-15 DIAGNOSIS — I10 Essential (primary) hypertension: Secondary | ICD-10-CM | POA: Insufficient documentation

## 2014-08-15 DIAGNOSIS — Z046 Encounter for general psychiatric examination, requested by authority: Secondary | ICD-10-CM | POA: Diagnosis present

## 2014-08-15 DIAGNOSIS — F251 Schizoaffective disorder, depressive type: Secondary | ICD-10-CM | POA: Diagnosis not present

## 2014-08-15 DIAGNOSIS — F79 Unspecified intellectual disabilities: Secondary | ICD-10-CM | POA: Diagnosis not present

## 2014-08-15 DIAGNOSIS — F259 Schizoaffective disorder, unspecified: Secondary | ICD-10-CM | POA: Diagnosis not present

## 2014-08-15 DIAGNOSIS — Z79899 Other long term (current) drug therapy: Secondary | ICD-10-CM | POA: Diagnosis not present

## 2014-08-15 DIAGNOSIS — Z7982 Long term (current) use of aspirin: Secondary | ICD-10-CM | POA: Diagnosis not present

## 2014-08-15 DIAGNOSIS — E119 Type 2 diabetes mellitus without complications: Secondary | ICD-10-CM | POA: Insufficient documentation

## 2014-08-15 DIAGNOSIS — K5900 Constipation, unspecified: Secondary | ICD-10-CM | POA: Insufficient documentation

## 2014-08-15 DIAGNOSIS — R45851 Suicidal ideations: Secondary | ICD-10-CM

## 2014-08-15 DIAGNOSIS — F419 Anxiety disorder, unspecified: Secondary | ICD-10-CM | POA: Insufficient documentation

## 2014-08-15 DIAGNOSIS — E785 Hyperlipidemia, unspecified: Secondary | ICD-10-CM | POA: Insufficient documentation

## 2014-08-15 DIAGNOSIS — H9190 Unspecified hearing loss, unspecified ear: Secondary | ICD-10-CM | POA: Insufficient documentation

## 2014-08-15 DIAGNOSIS — E876 Hypokalemia: Secondary | ICD-10-CM | POA: Diagnosis not present

## 2014-08-15 LAB — BASIC METABOLIC PANEL
Anion gap: 13 (ref 5–15)
BUN: 17 mg/dL (ref 6–20)
CHLORIDE: 104 mmol/L (ref 101–111)
CO2: 28 mmol/L (ref 22–32)
Calcium: 9.7 mg/dL (ref 8.9–10.3)
Creatinine, Ser: 2.11 mg/dL — ABNORMAL HIGH (ref 0.61–1.24)
GFR calc non Af Amer: 34 mL/min — ABNORMAL LOW (ref >60)
GFR, EST AFRICAN AMERICAN: 39 mL/min — AB (ref >60)
Glucose, Bld: 119 mg/dL — ABNORMAL HIGH (ref 65–99)
Potassium: 3.3 mmol/L — ABNORMAL LOW (ref 3.5–5.1)
SODIUM: 145 mmol/L (ref 135–145)

## 2014-08-15 LAB — CBC WITH DIFFERENTIAL/PLATELET
BASOS ABS: 0 10*3/uL (ref 0.0–0.1)
BASOS PCT: 1 % (ref 0–1)
EOS ABS: 0.2 10*3/uL (ref 0.0–0.7)
Eosinophils Relative: 4 % (ref 0–5)
HEMATOCRIT: 46.5 % (ref 39.0–52.0)
Hemoglobin: 15.5 g/dL (ref 13.0–17.0)
LYMPHS PCT: 33 % (ref 12–46)
Lymphs Abs: 1.7 10*3/uL (ref 0.7–4.0)
MCH: 30.4 pg (ref 26.0–34.0)
MCHC: 33.3 g/dL (ref 30.0–36.0)
MCV: 91.2 fL (ref 78.0–100.0)
Monocytes Absolute: 0.6 10*3/uL (ref 0.1–1.0)
Monocytes Relative: 12 % (ref 3–12)
NEUTROS PCT: 50 % (ref 43–77)
Neutro Abs: 2.6 10*3/uL (ref 1.7–7.7)
Platelets: 285 10*3/uL (ref 150–400)
RBC: 5.1 MIL/uL (ref 4.22–5.81)
RDW: 13 % (ref 11.5–15.5)
WBC: 5.1 10*3/uL (ref 4.0–10.5)

## 2014-08-15 LAB — URINALYSIS, ROUTINE W REFLEX MICROSCOPIC
Bilirubin Urine: NEGATIVE
GLUCOSE, UA: 100 mg/dL — AB
HGB URINE DIPSTICK: NEGATIVE
Ketones, ur: NEGATIVE mg/dL
LEUKOCYTES UA: NEGATIVE
NITRITE: NEGATIVE
Protein, ur: NEGATIVE mg/dL
Specific Gravity, Urine: 1.015 (ref 1.005–1.030)
Urobilinogen, UA: 0.2 mg/dL (ref 0.0–1.0)
pH: 8 (ref 5.0–8.0)

## 2014-08-15 LAB — MAGNESIUM: MAGNESIUM: 2.1 mg/dL (ref 1.7–2.4)

## 2014-08-15 LAB — RAPID URINE DRUG SCREEN, HOSP PERFORMED
Amphetamines: NOT DETECTED
BARBITURATES: NOT DETECTED
Benzodiazepines: NOT DETECTED
Cocaine: NOT DETECTED
OPIATES: NOT DETECTED
Tetrahydrocannabinol: NOT DETECTED

## 2014-08-15 LAB — I-STAT TROPONIN, ED: TROPONIN I, POC: 0.01 ng/mL (ref 0.00–0.08)

## 2014-08-15 LAB — VALPROIC ACID LEVEL: VALPROIC ACID LVL: 28 ug/mL — AB (ref 50.0–100.0)

## 2014-08-15 LAB — CBG MONITORING, ED: Glucose-Capillary: 172 mg/dL — ABNORMAL HIGH (ref 65–99)

## 2014-08-15 LAB — POTASSIUM: Potassium: 3.8 mmol/L (ref 3.5–5.1)

## 2014-08-15 LAB — ETHANOL

## 2014-08-15 MED ORDER — ONDANSETRON HCL 4 MG/2ML IJ SOLN
4.0000 mg | Freq: Once | INTRAMUSCULAR | Status: AC
  Administered 2014-08-15: 4 mg via INTRAVENOUS
  Filled 2014-08-15: qty 2

## 2014-08-15 MED ORDER — SODIUM CHLORIDE 0.9 % IV BOLUS (SEPSIS)
1000.0000 mL | Freq: Once | INTRAVENOUS | Status: AC
  Administered 2014-08-15: 1000 mL via INTRAVENOUS

## 2014-08-15 MED ORDER — POTASSIUM CHLORIDE CRYS ER 20 MEQ PO TBCR
40.0000 meq | EXTENDED_RELEASE_TABLET | ORAL | Status: AC
  Administered 2014-08-15 (×2): 40 meq via ORAL
  Filled 2014-08-15 (×2): qty 2

## 2014-08-15 MED ORDER — ZIPRASIDONE MESYLATE 20 MG IM SOLR
20.0000 mg | Freq: Once | INTRAMUSCULAR | Status: AC
  Administered 2014-08-15: 20 mg via INTRAMUSCULAR
  Filled 2014-08-15: qty 20

## 2014-08-15 MED ORDER — LORAZEPAM 2 MG/ML IJ SOLN
2.0000 mg | Freq: Once | INTRAMUSCULAR | Status: AC
  Administered 2014-08-15: 2 mg via INTRAMUSCULAR
  Filled 2014-08-15: qty 1

## 2014-08-15 NOTE — ED Notes (Addendum)
Pt is sitting in a chair with both arms crossed and very little eye contact when spoken to. Pt will not respond to any questions asked. He does appear comfortable and was offered a drink but declined. Report was given from [facility]. Pt continues to talk to himself and at times raises his voice. He will not respond when asked if he wants something to drink or eat. 4:45p-pt stated,"god damnit give me that fing gun and let me kill myself." pt was given 2mg  of ativan Im in left deltoid. Pt keeps saying, "get my  fing clothes out of that house now." Pt was very agitated so was given 20mg  of geodon. Pt tolerated well. At 5:5opm Pt became diaphoretic and was put in Bed. ER MD was called. EKG done and showed prolonged QT interval. Pt was placed on a monitor and put on 2 liters nasal canula for a pulse ox of 94%. Right arm 20g IV placed with a liter bagof NSS. Pt remains in a SR w/o ectopy. Pt denies chest pain at this time. FSBS 171. Pt is sitting up eating dinner. He nodded his head he felt better. Pt pulse ox now is 98%. Tropnion sent on pt. Stat. Pt stated he did not want to live in that hell hole. Corporate treasurer at the bedside. 6:35p-Pt appears very comfortable. He took his medication and was very cooperative and calm. Pt remains on a heart monitor in a SR w/o ectopy. Troponin  within normal limits. 8p-Pt is resting comfortably. He remains in a SR w/o ectopy. 9:15p-urine sent on pt. Vital signs are stable. Pt voided 600cc of urine,.Will continue to monitor closely. Pt remains cooperative but does not like where he lives at all. He stated,"mean."10:30pm-Pt vomitted 300cc of brown vomitus after taking his potassium pills. MD aware and pt was given 4mg  of zofran IV. Potassium level drawn at 11pm. Pharmacy phoned worried they had received the med rec from the group home today and all the pts medications had been checked off as given all day today and this pm. The pt has been at [facility] since 2p today and remains  in the hospital. Report given to nurse amy who will contact the on call doctor concerning this matter. Pt appears comfortable at this time.

## 2014-08-15 NOTE — ED Notes (Signed)
Unable to collect labs at this time MD wants to talk with patient first.

## 2014-08-15 NOTE — ED Provider Notes (Signed)
  Physical Exam  BP 143/97 mmHg  Pulse 94  Temp(Src) 98.1 F (36.7 C) (Oral)  Resp 18  SpO2 94%  Physical Exam  ED Course  Procedures  MDM Patient was under IVC and given geodon and ativan for agitation. I was called since he is diaphoretic and had chest pain. Patient has prolonged QTc of 500 that is now. Will draw off istat trop and hydrate. Will need to avoid antipsychotics for now.   8pm Trop neg x 1. Appears better as per nursing. Dr. Eulis Foster replaced his potassium. Recommend repeat K at 11 pm.  11:23 PM Potassium level pending. Signed out to Dr. Colin Rhein to f/u K. Patient under IVC.   Wandra Arthurs, MD 08/15/14 [phone]

## 2014-08-15 NOTE — ED Provider Notes (Signed)
CSN: [id number]     Arrival date & time 08/15/14  1350 History  This chart was scribed for Daleen Bo, MD by Mercy Moore, ED scribe.  This patient was seen in room WA30/WA30 and the patient's care was started at 2:09 PM.   Chief Complaint  Patient presents with  . IVC    The history is limited by the condition of the patient. No language interpreter was used.   Level 5 Caveat: Altered Mental Status  HPI Comments: Shawn Baird is a 54 y.o. male who presents to the Emergency Department after being involuntarily committed. IVC documentation indicates diagnoses of bipolar disorder, impulsive control disorder, mental retardation, and schizoaffective disorder and that patient is refusing his medications, neglecting personal hygiene, and necessary medical attention. Petition also alludes to suicidal ideation: patient has told his care provider that  "he wants to blow his brains out."     Past Medical History  Diagnosis Date  . Diabetes mellitus   . Anxiety   . Hypertension   . Hyperlipemia   . Mental disorder   . Schizoaffective disorder   . Impulsive control disorder  . Pancreatitis   . Otitis media   . Cancer   . HOH (hard of hearing)   . Tourette's disease   . Mental retardation   . Chronic constipation    Past Surgical History  Procedure Laterality Date  . 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 High blood pressure Sister   . High Cholesterol Sister   . Heart disease Mother   . Stroke Mother    History  Substance Use Topics  . Smoking status: Never Smoker   . Smokeless tobacco: Never Used  . Alcohol Use: No    Review of Systems  Unable to perform ROS: Other    Allergies  Review of patient's allergies indicates no known allergies.  Home Medications   Prior to Admission medications   Medication Sig Start Date End Date Taking? Authorizing Provider  amLODipine (NORVASC) 5 MG tablet Take 1  tablet (5 mg total) by mouth every morning. 10/14/13   Patrecia Pour, NP  aspirin EC 81 MG tablet Take 1 tablet (81 mg total) by mouth every morning. 10/14/13   Patrecia Pour, NP  buPROPion (WELLBUTRIN XL) 300 MG 24 hr tablet Take 1 tablet (300 mg total) by mouth every morning. 10/14/13   Patrecia Pour, NP  clonazePAM (KLONOPIN) 0.5 MG tablet Take 1 tablet (0.5 mg total) by mouth every 12 (twelve) hours as needed (For agitation.). 10/14/13   Patrecia Pour, NP  dicyclomine (BENTYL) 20 MG tablet Take 1 tablet (20 mg total) by mouth 4 (four) times daily. 10/14/13   Patrecia Pour, NP  divalproex (DEPAKOTE ER) 500 MG 24 hr tablet Take 1 tablet (500 mg total) by mouth 2 (two) times daily. 10/14/13   Patrecia Pour, NP  fenofibrate 54 MG tablet Take 1 tablet (54 mg total) by mouth daily. 10/14/13   Patrecia Pour, NP  gabapentin (NEURONTIN) 300 MG capsule Take 1 capsule (300 mg total) by mouth at bedtime. 10/14/13   Patrecia Pour, NP  glipiZIDE (GLUCOTROL XL) 2.5 MG 24 hr tablet Take 1 tablet (2.5 mg total) by mouth daily with breakfast. 10/14/13   Patrecia Pour, NP  hydrOXYzine (ATARAX/VISTARIL) 25 MG tablet Take 25 mg by mouth 4 (four) times daily  as needed for anxiety.    Historical Provider, MD  loratadine (CLARITIN) 10 MG tablet Take 1 tablet (10 mg total) by mouth every morning. 10/14/13   Patrecia Pour, NP  lubiprostone (AMITIZA) 24 MCG capsule Take 1 capsule (24 mcg total) by mouth 2 (two) times daily. 10/14/13   Patrecia Pour, NP  metoCLOPramide (REGLAN) 10 MG tablet Take 1 tablet (10 mg total) by mouth 4 (four) times daily -  before meals and at bedtime. 10/14/13   Patrecia Pour, NP  metoprolol tartrate (LOPRESSOR) 25 MG tablet Take 0.5 tablets (12.5 mg total) by mouth 2 (two) times daily. 10/14/13   Patrecia Pour, NP  omeprazole (PRILOSEC) 20 MG capsule Take 1 capsule (20 mg total) by mouth every morning. 10/14/13   Patrecia Pour, NP  ondansetron (ZOFRAN) 4 MG tablet Take 4 mg by mouth every 6 (six)  hours as needed for nausea.    Historical Provider, MD  polyethylene glycol (MIRALAX / GLYCOLAX) packet Take 17 g by mouth every morning. 10/14/13   Patrecia Pour, NP  pravastatin (PRAVACHOL) 20 MG tablet Take 1 tablet (20 mg total) by mouth daily. 10/14/13   Patrecia Pour, NP  QUEtiapine (SEROQUEL) 100 MG tablet Take 1 tablet (100 mg total) by mouth 2 (two) times daily. 10/14/13   Patrecia Pour, NP  QUEtiapine (SEROQUEL) 200 MG tablet Take 3 tablets (600 mg total) by mouth at bedtime. 10/14/13   Patrecia Pour, NP  testosterone cypionate (DEPOTESTOTERONE CYPIONATE) 100 MG/ML injection Inject 1 mL (100 mg total) into the muscle every 28 (twenty-eight) days. For IM use only 10/14/13   Patrecia Pour, NP  tiZANidine (ZANAFLEX) 4 MG tablet Take 4 mg by mouth every 6 (six) hours as needed for muscle spasms.    Historical Provider, MD  traMADol (ULTRAM) 50 MG tablet Take by mouth 3 (three) times daily as needed (For abdominal pain.).    Historical Provider, MD  vitamin B-12 (CYANOCOBALAMIN) 1000 MCG tablet Take 1 tablet (1,000 mcg total) by mouth every morning. 10/14/13   Patrecia Pour, NP  Vitamin D, Ergocalciferol, (DRISDOL) 50000 UNITS CAPS capsule Take 1 capsule (50,000 Units total) by mouth 2 (two) times a week. He takes on Tuesdays and Thursdays. 10/14/13   Patrecia Pour, NP   BP 143/97 mmHg  Pulse 94  Temp(Src) 98.1 F (36.7 C) (Oral)  Resp 18  SpO2 94% Physical Exam  Constitutional: He appears well-developed and well-nourished.  HENT:  Head: Normocephalic and atraumatic.  Right Ear: External ear normal.  Left Ear: External ear normal.  Eyes: Conjunctivae and EOM are normal. Pupils are equal, round, and reactive to light.  Neck: Normal range of motion and phonation normal. Neck supple.  Cardiovascular: Normal rate.   Pulmonary/Chest: Effort normal. He exhibits no bony tenderness.  Musculoskeletal: Normal range of motion.  Neurological: He is alert. No cranial nerve deficit or sensory  deficit. He exhibits normal muscle tone. Coordination normal.  Skin: Skin is warm, dry and intact.  Psychiatric:  Agitated, refusing to answer questions  Nursing note and vitals reviewed.   ED Course  Procedures (including critical care time)  Patient is a very uncooperative are unable to comply with efforts to complete history and physical examination. There is no sign of a localizing neurologic abnormality. He will be chemically and physically restrained, to proceed with evaluation.  Medications  sodium chloride 0.9 % bolus 1,000 mL (not administered)  ziprasidone (GEODON) injection 20 mg (  20 mg Intramuscular Given 08/15/14 1517)  LORazepam (ATIVAN) injection 2 mg (2 mg Intramuscular Given 08/15/14 1517)    Patient Vitals for the past 24 hrs:  BP Temp Temp src Pulse Resp SpO2  08/15/14 1743 143/97 mmHg 98.1 F (36.7 C) Oral 94 18 94 %  08/15/14 1421 121/85 mmHg 98.5 F (36.9 C) Oral 97 - 98 %   Involuntary commitment, upheld, with first opinion, by me   7:56 PM Reevaluation with update and discussion. After initial assessment and treatment, an updated evaluation reveals At this time. He is alert, calm and cooperative. He does not verbalize, when I ask him questions, but he Acknowledges me, and indicates that he is comfortable. , L      CRITICAL CARE Performed by: Daleen Bo L Total critical care time: 35 minutes Critical care time was exclusive of separately billable procedures and treating other patients. Critical care was necessary to treat or prevent imminent or life-threatening deterioration. Critical care was time spent personally by me on the following activities: development of treatment plan with patient and/or surrogate as well as nursing, discussions with consultants, evaluation of patient's response to treatment, examination of patient, obtaining history from patient or surrogate, ordering and performing treatments and interventions, ordering and review  of laboratory studies, ordering and review of radiographic studies, pulse oximetry and re-evaluation of patient's condition.   Patient Vitals for the past 24 hrs:  BP Temp Temp src Pulse Resp SpO2  08/15/14 1743 143/97 mmHg 98.1 F (36.7 C) Oral 94 18 94 %  08/15/14 1421 121/85 mmHg 98.5 F (36.9 C) Oral 97 - 98 %    Labs Review Labs Reviewed  BASIC METABOLIC PANEL - Abnormal; Notable for the following:    Potassium 3.3 (*)    Glucose, Bld 119 (*)    Creatinine, Ser 2.11 (*)    GFR calc non Af Amer 34 (*)    GFR calc Af Amer 39 (*)    All other components within normal limits  VALPROIC ACID LEVEL - Abnormal; Notable for the following:    Valproic Acid Lvl 28 (*)    All other components within normal limits  CBC WITH DIFFERENTIAL/PLATELET  ETHANOL  URINALYSIS, ROUTINE W REFLEX MICROSCOPIC (NOT AT [facility])  URINE RAPID DRUG SCREEN, HOSP PERFORMED  I-STAT TROPOININ, ED    Imaging Review No results found.   EKG Interpretation   Date/Time:  Wednesday August 15 2014 17:41:16 EDT Ventricular Rate:  89 PR Interval:  156 QRS Duration: 96 QT Interval:  412 QTC Calculation: 501 R Axis:   41 Text Interpretation:  Normal sinus rhythm Nonspecific T wave abnormality  Prolonged QT Abnormal ECG Prolonged QT new since previous Confirmed by YAO   MD, DAVID ([id number]) on 08/15/2014 5:48:56 PM      MDM   Final diagnoses:  Suicidal ideation  Hypokalemia    Suicidal ideation, which is apparently a recurrent problem. Patient has ongoing chronic psychiatric issues as well as mental retardation. There was incidental hypokalemia noted after an episode of diaphoresis, but no untoward event associated with Administration of the Geodon. His QT is somewhat more prolonged than prior. He received treatment for hypokalemia, orally and a repeat potassium has been ordered  Nursing Notes Reviewed/ Care Coordinated, and agree without changes. Applicable Imaging Reviewed.  Interpretation of Laboratory  Data incorporated into ED treatment  Plan: As per TTS in conjunction with oncoming provider team  I personally performed the services described in this documentation, which was scribed in my presence.  The recorded information has been reviewed and is accurate.  I personally performed the services described in this documentation, which was scribed in my presence. The recorded information has been reviewed and is accurate.     Daleen Bo, MD 08/15/14 2001

## 2014-08-15 NOTE — ED Notes (Signed)
Bed: WA30 Expected date:  Expected time:  Means of arrival:  Comments: Triage 5 

## 2014-08-15 NOTE — ED Notes (Signed)
Per IVC papers, pt with Hx of bipolar disorder, impulse control disorder, mental retardation and schizo disorder, is refusing medicationsand not tending to physical needs or personal hygeine. Respondent has states to care provider that he wanted to "blow his brains out." Care provider also stated that respondent is refusing medical attention that's necessary for his well-being.

## 2014-08-16 ENCOUNTER — Encounter (HOSPITAL_COMMUNITY): Payer: Self-pay | Admitting: Radiology

## 2014-08-16 ENCOUNTER — Emergency Department (HOSPITAL_COMMUNITY): Payer: Medicare Other

## 2014-08-16 DIAGNOSIS — F79 Unspecified intellectual disabilities: Secondary | ICD-10-CM | POA: Diagnosis not present

## 2014-08-16 LAB — LIPASE, BLOOD: LIPASE: 36 U/L (ref 22–51)

## 2014-08-16 LAB — I-STAT TROPONIN, ED: Troponin i, poc: 0 ng/mL (ref 0.00–0.08)

## 2014-08-16 LAB — COMPREHENSIVE METABOLIC PANEL
ALBUMIN: 4.2 g/dL (ref 3.5–5.0)
ALT: 26 U/L (ref 17–63)
AST: 37 U/L (ref 15–41)
Alkaline Phosphatase: 48 U/L (ref 38–126)
Anion gap: 8 (ref 5–15)
BUN: 15 mg/dL (ref 6–20)
CO2: 23 mmol/L (ref 22–32)
Calcium: 9.2 mg/dL (ref 8.9–10.3)
Chloride: 112 mmol/L — ABNORMAL HIGH (ref 101–111)
Creatinine, Ser: 1.76 mg/dL — ABNORMAL HIGH (ref 0.61–1.24)
GFR calc Af Amer: 48 mL/min — ABNORMAL LOW (ref >60)
GFR calc non Af Amer: 42 mL/min — ABNORMAL LOW (ref >60)
Glucose, Bld: 163 mg/dL — ABNORMAL HIGH (ref 65–99)
Potassium: 4.1 mmol/L (ref 3.5–5.1)
SODIUM: 143 mmol/L (ref 135–145)
TOTAL PROTEIN: 7.9 g/dL (ref 6.5–8.1)
Total Bilirubin: 0.3 mg/dL (ref 0.3–1.2)

## 2014-08-16 LAB — CBC WITH DIFFERENTIAL/PLATELET
Basophils Absolute: 0 10*3/uL (ref 0.0–0.1)
Basophils Relative: 0 % (ref 0–1)
EOS ABS: 0.1 10*3/uL (ref 0.0–0.7)
Eosinophils Relative: 1 % (ref 0–5)
HEMATOCRIT: 43.9 % (ref 39.0–52.0)
HEMOGLOBIN: 14.7 g/dL (ref 13.0–17.0)
Lymphocytes Relative: 15 % (ref 12–46)
Lymphs Abs: 1.2 10*3/uL (ref 0.7–4.0)
MCH: 30.6 pg (ref 26.0–34.0)
MCHC: 33.5 g/dL (ref 30.0–36.0)
MCV: 91.5 fL (ref 78.0–100.0)
MONO ABS: 0.8 10*3/uL (ref 0.1–1.0)
MONOS PCT: 10 % (ref 3–12)
Neutro Abs: 6 10*3/uL (ref 1.7–7.7)
Neutrophils Relative %: 74 % (ref 43–77)
PLATELETS: 268 10*3/uL (ref 150–400)
RBC: 4.8 MIL/uL (ref 4.22–5.81)
RDW: 13.1 % (ref 11.5–15.5)
WBC: 8 10*3/uL (ref 4.0–10.5)

## 2014-08-16 MED ORDER — HYDROXYZINE HCL 25 MG PO TABS: 25.0000 mg | ORAL_TABLET | Freq: Four times a day (QID) | ORAL | Status: DC | PRN

## 2014-08-16 MED ORDER — HYDROMORPHONE HCL 1 MG/ML IJ SOLN
1.0000 mg | Freq: Once | INTRAMUSCULAR | Status: AC
  Administered 2014-08-16: 1 mg via INTRAVENOUS
  Filled 2014-08-16: qty 1

## 2014-08-16 MED ORDER — PANTOPRAZOLE SODIUM 40 MG PO TBEC
40.0000 mg | DELAYED_RELEASE_TABLET | Freq: Every day | ORAL | Status: DC
  Administered 2014-08-16 – 2014-08-19 (×4): 40 mg via ORAL
  Filled 2014-08-16 (×4): qty 1

## 2014-08-16 MED ORDER — METOCLOPRAMIDE HCL 5 MG/ML IJ SOLN
10.0000 mg | Freq: Once | INTRAMUSCULAR | Status: AC
  Administered 2014-08-16: 10 mg via INTRAVENOUS
  Filled 2014-08-16: qty 2

## 2014-08-16 MED ORDER — DICYCLOMINE HCL 10 MG/ML IM SOLN
20.0000 mg | Freq: Once | INTRAMUSCULAR | Status: AC
  Administered 2014-08-16: 20 mg via INTRAMUSCULAR
  Filled 2014-08-16: qty 2

## 2014-08-16 MED ORDER — QUETIAPINE FUMARATE 300 MG PO TABS
600.0000 mg | ORAL_TABLET | Freq: Every day | ORAL | Status: DC
  Administered 2014-08-16 – 2014-08-18 (×3): 600 mg via ORAL
  Filled 2014-08-16 (×3): qty 2

## 2014-08-16 MED ORDER — FENOFIBRATE 54 MG PO TABS
54.0000 mg | ORAL_TABLET | Freq: Every day | ORAL | Status: DC
  Administered 2014-08-16 – 2014-08-19 (×4): 54 mg via ORAL
  Filled 2014-08-16 (×5): qty 1

## 2014-08-16 MED ORDER — PRAVASTATIN SODIUM 20 MG PO TABS
20.0000 mg | ORAL_TABLET | Freq: Every day | ORAL | Status: DC
  Administered 2014-08-16 – 2014-08-19 (×4): 20 mg via ORAL
  Filled 2014-08-16 (×5): qty 1

## 2014-08-16 MED ORDER — LORATADINE 10 MG PO TABS
10.0000 mg | ORAL_TABLET | Freq: Every morning | ORAL | Status: DC
  Administered 2014-08-16 – 2014-08-18 (×3): 10 mg via ORAL
  Filled 2014-08-16 (×4): qty 1

## 2014-08-16 MED ORDER — METOCLOPRAMIDE HCL 10 MG PO TABS
10.0000 mg | ORAL_TABLET | Freq: Three times a day (TID) | ORAL | Status: DC
  Administered 2014-08-16 – 2014-08-19 (×13): 10 mg via ORAL
  Filled 2014-08-16 (×13): qty 1

## 2014-08-16 MED ORDER — LORAZEPAM 2 MG/ML IJ SOLN
1.0000 mg | Freq: Once | INTRAMUSCULAR | Status: AC
  Administered 2014-08-16: 1 mg via INTRAVENOUS
  Filled 2014-08-16: qty 1

## 2014-08-16 MED ORDER — ONDANSETRON HCL 4 MG PO TABS: 4.0000 mg | ORAL_TABLET | Freq: Four times a day (QID) | ORAL | Status: DC | PRN

## 2014-08-16 MED ORDER — PANTOPRAZOLE SODIUM 20 MG PO TBEC: 20.0000 mg | DELAYED_RELEASE_TABLET | Freq: Every day | ORAL | Status: DC

## 2014-08-16 MED ORDER — GLIPIZIDE ER 2.5 MG PO TB24
2.5000 mg | ORAL_TABLET | Freq: Every day | ORAL | Status: DC
  Administered 2014-08-17 – 2014-08-19 (×3): 2.5 mg via ORAL
  Filled 2014-08-16 (×4): qty 1

## 2014-08-16 MED ORDER — TIZANIDINE HCL 4 MG PO TABS
4.0000 mg | ORAL_TABLET | Freq: Four times a day (QID) | ORAL | Status: DC | PRN
  Filled 2014-08-16: qty 1

## 2014-08-16 MED ORDER — POLYETHYLENE GLYCOL 3350 17 G PO PACK
17.0000 g | PACK | Freq: Every morning | ORAL | Status: DC
  Administered 2014-08-16 – 2014-08-19 (×4): 17 g via ORAL
  Filled 2014-08-16 (×4): qty 1

## 2014-08-16 MED ORDER — GABAPENTIN 300 MG PO CAPS
300.0000 mg | ORAL_CAPSULE | Freq: Every day | ORAL | Status: DC
  Administered 2014-08-16 – 2014-08-18 (×3): 300 mg via ORAL
  Filled 2014-08-16 (×3): qty 1

## 2014-08-16 MED ORDER — ONDANSETRON HCL 4 MG/2ML IJ SOLN
4.0000 mg | Freq: Once | INTRAMUSCULAR | Status: AC
  Administered 2014-08-16: 4 mg via INTRAVENOUS
  Filled 2014-08-16: qty 2

## 2014-08-16 MED ORDER — IOHEXOL 300 MG/ML  SOLN
100.0000 mL | Freq: Once | INTRAMUSCULAR | Status: AC | PRN
  Administered 2014-08-16: 80 mL via INTRAVENOUS

## 2014-08-16 MED ORDER — DICYCLOMINE HCL 20 MG PO TABS
20.0000 mg | ORAL_TABLET | Freq: Four times a day (QID) | ORAL | Status: DC
  Administered 2014-08-16 – 2014-08-19 (×11): 20 mg via ORAL
  Filled 2014-08-16 (×12): qty 1

## 2014-08-16 MED ORDER — QUETIAPINE FUMARATE 100 MG PO TABS: 100.0000 mg | ORAL_TABLET | Freq: Two times a day (BID) | ORAL | Status: DC

## 2014-08-16 MED ORDER — AMLODIPINE BESYLATE 5 MG PO TABS
5.0000 mg | ORAL_TABLET | Freq: Every morning | ORAL | Status: DC
  Administered 2014-08-16 – 2014-08-19 (×4): 5 mg via ORAL
  Filled 2014-08-16 (×4): qty 1

## 2014-08-16 MED ORDER — IOHEXOL 300 MG/ML  SOLN: 50.0000 mL | Freq: Once | INTRAMUSCULAR | Status: DC | PRN

## 2014-08-16 MED ORDER — LUBIPROSTONE 24 MCG PO CAPS
24.0000 ug | ORAL_CAPSULE | Freq: Two times a day (BID) | ORAL | Status: DC
  Administered 2014-08-16 – 2014-08-19 (×7): 24 ug via ORAL
  Filled 2014-08-16 (×9): qty 1

## 2014-08-16 MED ORDER — ASPIRIN EC 81 MG PO TBEC
81.0000 mg | DELAYED_RELEASE_TABLET | Freq: Every morning | ORAL | Status: DC
  Administered 2014-08-16 – 2014-08-19 (×4): 81 mg via ORAL
  Filled 2014-08-16 (×4): qty 1

## 2014-08-16 MED ORDER — VITAMIN D (ERGOCALCIFEROL) 1.25 MG (50000 UNIT) PO CAPS
50000.0000 [IU] | ORAL_CAPSULE | ORAL | Status: DC
  Administered 2014-08-16: 50000 [IU] via ORAL
  Filled 2014-08-16: qty 1

## 2014-08-16 MED ORDER — TESTOSTERONE CYPIONATE 100 MG/ML IM SOLN: 100.0000 mg | INTRAMUSCULAR | Status: DC

## 2014-08-16 MED ORDER — CLONAZEPAM 0.5 MG PO TABS: 0.5000 mg | ORAL_TABLET | Freq: Two times a day (BID) | ORAL | Status: DC | PRN

## 2014-08-16 MED ORDER — DIVALPROEX SODIUM ER 500 MG PO TB24
500.0000 mg | ORAL_TABLET | Freq: Two times a day (BID) | ORAL | Status: DC
  Administered 2014-08-16 – 2014-08-19 (×7): 500 mg via ORAL
  Filled 2014-08-16 (×8): qty 1

## 2014-08-16 MED ORDER — PROMETHAZINE HCL 25 MG/ML IJ SOLN
25.0000 mg | Freq: Once | INTRAMUSCULAR | Status: AC
  Administered 2014-08-16: 25 mg via INTRAMUSCULAR
  Filled 2014-08-16: qty 1

## 2014-08-16 MED ORDER — METOPROLOL TARTRATE 25 MG PO TABS
12.5000 mg | ORAL_TABLET | Freq: Two times a day (BID) | ORAL | Status: DC
  Administered 2014-08-16 – 2014-08-19 (×7): 12.5 mg via ORAL
  Filled 2014-08-16 (×7): qty 1

## 2014-08-16 MED ORDER — SODIUM CHLORIDE 0.9 % IV BOLUS (SEPSIS)
1000.0000 mL | Freq: Once | INTRAVENOUS | Status: AC
  Administered 2014-08-16: 1000 mL via INTRAVENOUS

## 2014-08-16 MED ORDER — VITAMIN B-12 1000 MCG PO TABS
1000.0000 ug | ORAL_TABLET | Freq: Every morning | ORAL | Status: DC
  Administered 2014-08-16 – 2014-08-19 (×4): 1000 ug via ORAL
  Filled 2014-08-16 (×4): qty 1

## 2014-08-16 MED ORDER — TRAMADOL HCL 50 MG PO TABS: 50.0000 mg | ORAL_TABLET | Freq: Three times a day (TID) | ORAL | Status: DC | PRN

## 2014-08-16 MED ORDER — QUETIAPINE FUMARATE 100 MG PO TABS
100.0000 mg | ORAL_TABLET | Freq: Two times a day (BID) | ORAL | Status: DC
  Administered 2014-08-16 – 2014-08-19 (×7): 100 mg via ORAL
  Filled 2014-08-16 (×7): qty 1

## 2014-08-16 MED ORDER — BUPROPION HCL ER (XL) 300 MG PO TB24
300.0000 mg | ORAL_TABLET | Freq: Every morning | ORAL | Status: DC
  Administered 2014-08-16 – 2014-08-19 (×4): 300 mg via ORAL
  Filled 2014-08-16 (×4): qty 1

## 2014-08-16 NOTE — ED Notes (Signed)
MD at bedside. 

## 2014-08-16 NOTE — ED Notes (Signed)
Bed: WA27 Expected date:  Expected time:  Means of arrival:  Comments: Bed 24

## 2014-08-16 NOTE — Progress Notes (Signed)
Spoke with Dr Roderic Palau about pt home medications  EDP holding meds at this time

## 2014-08-16 NOTE — BH Assessment (Signed)
The EDP ordered the consult this am. Writer attempted to assess the patient however he refused to answer questions. Patient stated that his stomach hurts and he just finished throwing up. Writer discussed medical clearance with EDP-Dr. Roderic Palau whom stated that patient is NOT medically stable at this time. Writer asked EDP to contact TTS and/or re-order another consult when patient is able to answer questions and/or medically stable.

## 2014-08-16 NOTE — ED Provider Notes (Signed)
Pt had abd pain with vomiting today. He has been off his medicines recently.   Pt with unremarkable labs and normal ct abd.   Pt improving with getting his normal meds.  Pt is medicially cleared now and is awaiting tts consult  Milton Ferguson, MD 08/16/14 1347

## 2014-08-16 NOTE — ED Notes (Signed)
Charge RN informed about patient to be moved to tcu

## 2014-08-16 NOTE — ED Notes (Signed)
Bedside report received from [place], [place].

## 2014-08-16 NOTE — ED Notes (Signed)
Pt crying in pain. CT Tech came to bring oral contrast. This RN will consult with EDP. Pt unable to tolerate.

## 2014-08-16 NOTE — ED Notes (Signed)
Dr Roderic Palau notified patient is in pain and hypertensive, also requested home medication list to be addressed.

## 2014-08-16 NOTE — Progress Notes (Signed)
CSW attempting to reach pt group home owner, Shawn Baird who ivc'd patient. CSW called Shawn Baird at [phone] however unable to leave message due to inbox being full.   Per RN, there is concerns regarding when patient received medications at facility. CSW trying to obtain MAR.   Per RN police had concern regarding conditions of the facility, at that 8 residents at the group home instead of the 4 that are suppose to be in the home and how dirty the house was.   CSW reaching out to patient sister, Shawn Baird who is patient legal guardian. Per sister patietn was taken to [facility] and then transferred to   Pt sister shares that patient is a resident at [facility]. Pt group home stated that patient has been refusing medication and starting mumbo jumbo under his breath. Pt sister stated that she was not informed until patient had been aggressive. Patient sister stated patient came to spend the night July 9th and taken back on July 10th. Patient sister stated that when patient returned that everything seemed normal, did not have any agitation about staying. Pt sister to fax psychological evaluation.    Belia Heman, [place] Work  [employer] [phone]

## 2014-08-16 NOTE — ED Notes (Signed)
MD and this RN at bedside. Patient is comfortable and denies any pain. Will PO challenge per MD request.

## 2014-08-16 NOTE — ED Notes (Signed)
Patient continues to have nausea and vomiting small amounts intermittently. MD notified. Orders received.

## 2014-08-16 NOTE — ED Notes (Signed)
Patient transported to CT 

## 2014-08-16 NOTE — ED Notes (Signed)
Dr Roderic Palau at bedside. Patient c/o abd pain, is unable to describe or localize. Patient abd appears distended.

## 2014-08-16 NOTE — BH Assessment (Signed)
Assessment Note  Shawn Baird is an 54 y.o. male with history of of bipolar disorder, impulsive control disorder, mental retardation, and schizoaffective disorder. Writer attempted to assess patient but he refused to answer only stating, "My stomach hurts". Patient would not respond to further questions during the assessment with the exceptio of nodding occasionally. Patient's assessment consist of the following collateral information:  Per EDP's-Dr. Beatriz Chancellor note: "The patient is refusing his medications, neglecting personal hygiene, and necessary medical attention. Petition also alludes to suicidal ideation: patient has told his care provider that "he wants to blow his brains out."   Per [employer] note:  CSW attempting to reach pt group home owner, Collins Scotland who ivc'd patient. CSW called Collins Scotland at [phone] however unable to leave message due to inbox being full. Per RN, there is concerns regarding when patient received medications at facility. CSW trying to obtain MAR. Per RN police had concern regarding conditions of the facility, at that 8 residents at the group home instead of the 4 that are suppose to be in the home and how dirty the house was.  CSW reaching out to patient sister, Adonijah Baena who is patient legal guardian. Per sister patient was taken to [facility] and then transferred. Pt's sister shares that patient is a resident at [facility]. Pt group home stated that patient has been refusing medication and starting mumbo jumbo under his breath. Pt sister stated that she was not informed until patient had been aggressive. Patient sister stated patient came to spend the night July 9th and taken back on July 10th. Patient sister stated that when patient returned that everything seemed normal, did not have any agitation about staying. Pt sister to fax psychological evaluation.       Axis I: Bipolar disorder and schizoaffective disorder Axis II: Mental  retardation, severity unknown Axis III:  Past Medical History  Diagnosis Date  . Anxiety   . Hypertension   . Hyperlipemia   . Mental disorder   . Schizoaffective disorder   . Impulsive control disorder  . Pancreatitis   . Otitis media   . Cancer   . HOH (hard of hearing)   . Tourette's disease   . Mental retardation   . Chronic constipation   . Diabetes mellitus    Axis IV: Psychosocial and Access to Healthcare  Axis V: 31-40 impairment in reality testing  Past Medical History:  Past Medical History  Diagnosis Date  . Anxiety   . Hypertension   . Hyperlipemia   . Mental disorder   . Schizoaffective disorder   . Impulsive control disorder  . Pancreatitis   . Otitis media   . Cancer   . HOH (hard of hearing)   . Tourette's disease   . Mental retardation   . Chronic constipation   . Diabetes mellitus     Past Surgical History  Procedure Laterality Date  . 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 High blood pressure Sister   . High Cholesterol Sister   . Heart disease Mother   . Stroke Mother     Social History:  reports that he has never smoked. He has never used smokeless tobacco. He reports that he does not drink alcohol or use illicit drugs.  Additional Social History:  Alcohol / Drug Use Pain Medications: Pt refuses to answer Prescriptions: Pt  refuses to answer Over the Counter: Pt refuses to answer History of alcohol / drug use?:  (Unk)  CIWA: CIWA-Ar BP: 128/94 mmHg Pulse Rate: 92 COWS:    Allergies: No Known Allergies  Home Medications:  (Not in a hospital admission)  OB/GYN Status:  No LMP for male patient.  General Assessment Data Location of Assessment: [facility] Is this a Tele or Face-to-Face Assessment?: Face-to-Face Is this an Initial Assessment or a Re-assessment for this encounter?: Initial Assessment Marital status: Single Maiden name:   (n/a) Is patient pregnant?: No Pregnancy Status: No Living Arrangements:  (pt lives in a group home "[place] [facility]") Can pt return to current living arrangement?: No Admission Status: Voluntary Is patient capable of signing voluntary admission?: No Referral Source: Self/Family/Friend Insurance type:  ([employer] Employee/[employer])     Crisis Care Plan Living Arrangements:  (pt lives in a group home "[place]") Name of Psychiatrist:  (unk) Name of Therapist:  (unk)  Education Status Is patient currently in school?: No Current Grade:  (n/a) Highest grade of school patient has completed:  (n/a) Name of school:  (n/a) Contact person:  (n/a)  Risk to self with the past 6 months Suicidal Ideation:  (pt will not confirm nor deny) Has patient been a risk to self within the past 6 months prior to admission? :  (unk) Suicidal Intent:  (unk) Has patient had any suicidal intent within the past 6 months prior to admission? :  (unk) Is patient at risk for suicide?:  (unk) Suicidal Plan?:  (unk) Has patient had any suicidal plan within the past 6 months prior to admission? :  (unk) Access to Means:  (unk) What has been your use of drugs/alcohol within the last 12 months?:  (unk) Previous Attempts/Gestures:  (unk) How many times?:  (unk) Other Self Harm Risks:  (unk) Triggers for Past Attempts: Unknown Intentional Self Injurious Behavior:  (unk) Family Suicide History: Unknown Recent stressful life event(s):  (unk) Persecutory voices/beliefs?:  (unk) Depression:  (unk) Depression Symptoms:  (unk) Substance abuse history and/or treatment for substance abuse?:  (unk) Suicide prevention information given to non-admitted patients: Not applicable (unk)  Risk to Others within the past 6 months Homicidal Ideation:  (pt will not confirm or deny) Does patient have any lifetime risk of violence toward others beyond the six months prior to admission? : Unknown Thoughts of  Harm to Others:  (unk) Current Homicidal Intent:  (unk) Current Homicidal Plan:  (unk) Access to Homicidal Means:  (unk) Identified Victim:  (n/a) History of harm to others?:  (unk) Assessment of Violence:  (unk) Violent Behavior Description:  (unk) Does patient have access to weapons?:  (unk) Criminal Charges Pending?:  (unk) Does patient have a court date:  (unk) Is patient on probation?: Unknown  Psychosis Hallucinations:  (unk) Delusions:  (unk)  Mental Status Report Appearance/Hygiene: In scrubs Eye Contact: Fair Motor Activity: Unable to assess Speech: Soft, Unable to assess, Slow (Garbled) Level of Consciousness: Unable to assess Mood:  (UTA) Affect: Unable to Assess Anxiety Level: Minimal Thought Processes: Unable to Assess Judgement: Unable to Assess Orientation: Unable to assess Obsessive Compulsive Thoughts/Behaviors: Unable to Assess  Cognitive Functioning Concentration: Unable to Assess Memory: Unable to Assess IQ: Average Insight: Poor Impulse Control: Poor Appetite: Fair Weight Loss:  (unk) Weight Gain:  (unk) Sleep: Decreased Total Hours of Sleep:  (unk) Vegetative Symptoms: Unable to Assess  ADLScreening [facility] Assessment Services) Patient's cognitive ability adequate to safely complete daily activities?:  No Patient able to express need for assistance with ADLs?: Yes Independently performs ADLs?: Yes (appropriate for developmental age)  Prior Inpatient Therapy Prior Inpatient Therapy: Yes Prior Therapy Dates:  (patient unable to provide info) Prior Therapy Facilty/Provider(s):  [facility] ) Reason for Treatment:  (psychosis )  Prior Outpatient Therapy Prior Outpatient Therapy: Yes Prior Therapy Dates:  ([facility]-past) Prior Therapy Facilty/Provider(s):  ([facility] ) Reason for Treatment:  (med managment ) Does patient have an ACCT team?: No Does patient have Intensive In-House Services?  :  No Does patient have [facility] services? : No Does patient have P4CC services?: No  ADL Screening (condition at time of admission) Patient's cognitive ability adequate to safely complete daily activities?: No Is the patient deaf or have difficulty hearing?: No Does the patient have difficulty seeing, even when wearing glasses/contacts?: No Does the patient have difficulty concentrating, remembering, or making decisions?: No Patient able to express need for assistance with ADLs?: Yes Does the patient have difficulty dressing or bathing?: No Independently performs ADLs?: Yes (appropriate for developmental age) Does the patient have difficulty walking or climbing stairs?: No Weakness of Legs: None Weakness of Arms/Hands: None  Home Assistive Devices/Equipment Home Assistive Devices/Equipment: None    Abuse/Neglect Assessment (Assessment to be complete while patient is alone) Physical Abuse: Denies Verbal Abuse: Denies Sexual Abuse: Denies Exploitation of patient/patient's resources: Denies Self-Neglect: Denies Values / Beliefs Cultural Requests During Hospitalization: None Spiritual Requests During Hospitalization: None   Advance Directives (For Healthcare) Does patient have an advance directive?: No    Additional Information 1:1 In Past 12 Months?: No CIRT Risk: No Elopement Risk: No Does patient have medical clearance?: Yes     Disposition:  Disposition Initial Assessment Completed for this Encounter: Yes Disposition of Patient: Inpatient treatment program (Per Reginold Agent, NP pt meets criteria for a inpatient admit ) Type of inpatient treatment program: Adult  On Site Evaluation by:   Reviewed with Physician:    Waldon Merl [facility] 08/16/2014 3:19 PM

## 2014-08-16 NOTE — ED Notes (Signed)
Patient pants and underwear soiled. Patient given dry pants, soiled underwear placed in a separate belongings bag, patient label attached, indicated on bag the items inside are "WET".

## 2014-08-16 NOTE — ED Notes (Signed)
Patient sobbing, holding his stomach.

## 2014-08-16 NOTE — ED Notes (Signed)
MD made aware of pts pain.

## 2014-08-17 DIAGNOSIS — F250 Schizoaffective disorder, bipolar type: Secondary | ICD-10-CM

## 2014-08-17 DIAGNOSIS — F79 Unspecified intellectual disabilities: Secondary | ICD-10-CM | POA: Diagnosis not present

## 2014-08-17 LAB — CBG MONITORING, ED
GLUCOSE-CAPILLARY: 87 mg/dL (ref 65–99)
GLUCOSE-CAPILLARY: 90 mg/dL (ref 65–99)
Glucose-Capillary: 117 mg/dL — ABNORMAL HIGH (ref 65–99)

## 2014-08-17 NOTE — Consult Note (Signed)
  Shawn Baird is an 54 y.o. male with history of of bipolar disorder, impulsive control disorder, mental retardation, and schizoaffective disorder. Writer attempted to assess patient but he refused to answer only stating, "My stomach hurts". Patient would not respond to further questions during the assessment with the exceptio of nodding occasionally. Patient's assessment consist of the following collateral information:  Per EDP's-Dr. Beatriz Chancellor note: "The patient is refusing his medications, neglecting personal hygiene, and necessary medical attention. Petition also alludes to suicidal ideation: patient has told his care provider that "he wants to blow his brains out."   Per [employer] note:  CSW attempting to reach pt group home owner, Shawn Baird who ivc'd patient. CSW called Shawn Baird at [phone] however unable to leave message due to inbox being full. Per RN, there is concerns regarding when patient received medications at facility. CSW trying to obtain MAR. Per RN police had concern regarding conditions of the facility, at that 8 residents at the group home instead of the 4 that are suppose to be in the home and how dirty the house was.  CSW reaching out to patient sister, Shawn Baird who is patient legal guardian. Per sister patient was taken to [facility] and then transferred. Pt's sister shares that patient is a resident at [facility]. Pt group home stated that patient has been refusing medication and starting mumbo jumbo under his breath. Pt sister stated that she was not informed until patient had been aggressive. Patient sister stated patient came to spend the night July 9th and taken back on July 10th. Patient sister stated that when patient returned that everything seemed normal, did not have any agitation about staying. Pt sister to fax psychological evaluation.   Above note is from TTS and ER.  All effert to interview patient has failed as he is not speaking to  providers.  It is documented that patient is not taking his medications and he has a hx of MR.  Our plan is to seek inpatient Psychiatric hospitalization.  Diagnosis:  Schizoaffective disorder, Bipolar type, MR  Plan: Continue offering home medications while we seek placement.  We  will continue to contact his group home staff. Shawn Baird   PMHNP-BC Patient seen face-to-face for psychiatric evaluation, chart reviewed and case discussed with the physician extender and developed treatment plan. Reviewed the information documented and agree with the treatment plan. Corena Pilgrim, MD

## 2014-08-17 NOTE — Progress Notes (Signed)
Evening CSW made APS report. CSW called to determine if patient case was assigned. CSW awaiting return call.   CSW called patient sister/guardian Antwuan Eckley [phone], requesting follow up regarding psychological and pt disposition. Pt sister/guardian to fax psychological around 130 on her lunch break.  Pt still rec. Inpatient treatmnet.   CSW attempted to reach Lear Ng, group home owner [phone] and unable to leave message.  CSW attempted to reach Dara white, group home cargiver at [phone] and number no longer working.  Belia Heman, [place] Work  [employer] [phone]

## 2014-08-18 DIAGNOSIS — F251 Schizoaffective disorder, depressive type: Secondary | ICD-10-CM | POA: Diagnosis not present

## 2014-08-18 DIAGNOSIS — F79 Unspecified intellectual disabilities: Secondary | ICD-10-CM | POA: Diagnosis not present

## 2014-08-18 LAB — CBG MONITORING, ED: GLUCOSE-CAPILLARY: 100 mg/dL — AB (ref 65–99)

## 2014-08-18 NOTE — Consult Note (Signed)
  Shawn Baird is an 54 y.o. male with history of of bipolar disorder, impulsive control disorder, mental retardation, and schizoaffective disorder. Writer attempted to assess patient but he refused to answer only stating, "My stomach hurts". Patient would not respond to further questions during the assessment with the exceptio of nodding occasionally. Patient's assessment consist of the following collateral information:  Per EDP's-Dr. Beatriz Chancellor note: "The patient is refusing his medications, neglecting personal hygiene, and necessary medical attention. Petition also alludes to suicidal ideation: patient has told his care provider that "he wants to blow his brains out."   Per [employer] note:  CSW attempting to reach pt group home owner, Shawn Baird who ivc'd patient. CSW called Shawn Baird at [phone] however unable to leave message due to inbox being full. Per RN, there is concerns regarding when patient received medications at facility. CSW trying to obtain MAR. Per RN police had concern regarding conditions of the facility, at that 8 residents at the group home instead of the 4 that are suppose to be in the home and how dirty the house was.  CSW reaching out to patient sister, Shawn Baird who is patient legal guardian. Per sister patient was taken to [facility] and then transferred. Pt's sister shares that patient is a resident at [facility]. Pt group home stated that patient has been refusing medication and starting mumbo jumbo under his breath. Pt sister stated that she was not informed until patient had been aggressive. Patient sister stated patient came to spend the night July 9th and taken back on July 10th. Patient sister stated that when patient returned that everything seemed normal, did not have any agitation about staying. Pt sister to fax psychological evaluation.   Above note is from TTS and ER.  All effert to interview patient has failed as he is not speaking to  providers.  It is documented that patient is not taking his medications and he has a hx of MR.  Our plan is to seek inpatient Psychiatric hospitalization. . Today, patient verbalized a little by stating that he is doing well.  He is taking his medications, eating and drinking.   He denies abdominal pain, denies SI/HI/AVH.   He does not know why he came in to the hospital. If he keeps improving and is able to speak more and give more information to providers we will involve SW in seeking a new placement since we are unable to reach his former group home staff.  Diagnosis:  Schizoaffective disorder, Bipolar type, MR   Plan: Continue offering home medications while we seek placement.  We  will continue to contact his group home staff.  Shawn Baird   PMHNP-BC  Patient seen face-to-face for psychiatric evaluation along with psychiatric nurse practitioner and case discussed with the treatment team. Patient has been compliant with medication and posteriorly responded and able to communicate verbally during this visit. Formulated treatment plan and reviewed the information documented and agree with the treatment plan.  ,Shawn R. 08/19/2014 7:15 PM

## 2014-08-19 DIAGNOSIS — F79 Unspecified intellectual disabilities: Secondary | ICD-10-CM | POA: Diagnosis not present

## 2014-08-19 DIAGNOSIS — F251 Schizoaffective disorder, depressive type: Secondary | ICD-10-CM | POA: Diagnosis not present

## 2014-08-19 NOTE — Progress Notes (Signed)
1:22pm. CSW called spoke with Lear Ng, group home manager, to inform of d/c. Richardson Landry states someone will come pick up pt between 3-4pm today.  CSW to sign off.  [place] Worker [facility] phone: [phone]

## 2014-08-19 NOTE — ED Notes (Signed)
Pt discharged to care of sister/guardian. Pt was to be picked up by group home worker as per CWS's discussion with Group Home Leader. Ride was suppose to be here between 3 and 4pm. At 530 no one had come to pick up pt. RN Curt Bears called group home and [employer] cell phone with no response. Pt's siter came to ED. As per Charge RN Patty's advice, pt was ok to be discharged to sister's care.

## 2014-08-19 NOTE — Consult Note (Signed)
[facility] Consult   Reason for Consult: Exacerbation of Schizoaffective disorder, Medication non compliant Referring Physician:  EDP Patient Identification: Shawn Baird MRN:  [id number] Principal Diagnosis: Schizoaffective disorder, depressive type Diagnosis:   Patient Active Problem List   Diagnosis Date Noted  . Schizoaffective disorder, depressive type [F25.1]     Priority: High  . Mental retardation [F79] 08/26/2012  . Aggressive behavior of adult [F60.89] 06/28/2012  . Gastroparesis [K31.84] 03/22/2012  . Transaminitis [R74.0] 03/11/2012  . Abdominal  pain, other specified site with nausea and vomiting, likely due to viral gastroenteritis versus diabetic gastroparesis [R10.84] 01/15/2012  . Diabetes [E11.9] 01/15/2012  . Hypertension [I10] 01/15/2012  . Hyperlipidemia [E78.5] 01/15/2012  . Schizoaffective disorder [F25.9] 01/15/2012  . Chronic constipation [K59.00] 01/15/2012  . GERD (gastroesophageal reflux disease) [K21.9] 01/15/2012  . Left Renal calcification [N28.89] 01/15/2012    Total Time spent with patient: 45 minutes  Subjective:   Shawn Baird is a 54 y.o. male patient admitted with Exacerbation of Schizoaffective disorder, Medication non compliant.  HPI:  AA male, 54 years old with a hx of Schizoaffective disorder and MR  And lives in the [facility].   He has not been talking to staff members and staff could not get in touch with his group home staff.  Today patient stated his name but could not engage in meaningful conversation with providers.  He repeatedly stated that he lives with his mother who gets on"my nerves"   He stated his full name, nodded his head yes when asked if he is sleeping well and eating well.  Staff documents that he has been taking his medications since his arrival and that he did not show signs of agitation or aggression towards staff.   His group home manager returned our numerous phone calls and is willing to take him home.   He  is discharged back  To his [facility] today.  HPI Elements:   Location:  Schizoaffective disorder, medication noncompliant. Quality:  moderate-severe. Severity:  Moderate-severe. Timing:  Acute. Duration:  Chronic mental illness. Context:  Brought in for agitataion and not taking medications..  Past Medical History:  Past Medical History  Diagnosis Date  . Anxiety   . Hypertension   . Hyperlipemia   . Mental disorder   . Schizoaffective disorder   . Impulsive control disorder  . Pancreatitis   . Otitis media   . Cancer   . HOH (hard of hearing)   . Tourette's disease   . Mental retardation   . Chronic constipation   . Diabetes mellitus     Past Surgical History  Procedure Laterality Date  . 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 High blood pressure Sister   . High Cholesterol Sister   . Heart disease Mother   . Stroke Mother    Social History:  History  Alcohol Use No     History  Drug Use No    History   Social History  . Marital Status: Single    Spouse Name: N/A  . Number of Children: N/A  . Years of Education: N/A   Social History Main Topics  . Smoking status: Never Smoker   . Smokeless tobacco: Never Used  . Alcohol Use: No  . Drug Use: No  . Sexual Activity: No   Other Topics Concern  . None   Social History  Narrative   Shawn Baird family group home   Additional Social History:    Pain Medications: Pt refuses to answer Prescriptions: Pt refuses to answer Over the Counter: Pt refuses to answer History of alcohol / drug use?:  (Unk)                     Allergies:  No Known Allergies  Labs:  Results for orders placed or performed during the hospital encounter of 08/15/14 (from the past 48 hour(s))  CBG monitoring, ED     Status: Abnormal   Collection Time: 08/17/14  7:00 PM  Result Value Ref Range   Glucose-Capillary 117 (H) 65 - 99 mg/dL  CBG  monitoring, ED     Status: Abnormal   Collection Time: 08/18/14  8:56 AM  Result Value Ref Range   Glucose-Capillary 100 (H) 65 - 99 mg/dL    Vitals: Blood pressure 97/67, pulse 89, temperature 97.7 F (36.5 C), temperature source Oral, resp. rate 16, SpO2 96 %.  Risk to Self: Suicidal Ideation:  (pt will not confirm nor deny) Suicidal Intent:  (unk) Is patient at risk for suicide?:  (unk) Suicidal Plan?:  (unk) Access to Means:  (unk) What has been your use of drugs/alcohol within the last 12 months?:  (unk) How many times?:  (unk) Other Self Harm Risks:  (unk) Triggers for Past Attempts: Unknown Intentional Self Injurious Behavior:  (unk) Risk to Others: Homicidal Ideation:  (pt will not confirm or deny) Thoughts of Harm to Others:  (unk) Current Homicidal Intent:  (unk) Current Homicidal Plan:  (unk) Access to Homicidal Means:  (unk) Identified Victim:  (n/a) History of harm to others?:  (unk) Assessment of Violence:  (unk) Violent Behavior Description:  (unk) Does patient have access to weapons?:  (unk) Criminal Charges Pending?:  (unk) Does patient have a court date:  (unk) Prior Inpatient Therapy: Prior Inpatient Therapy: Yes Prior Therapy Dates:  (patient unable to provide info) Prior Therapy Facilty/Provider(s):  [facility] ) Reason for Treatment:  (psychosis ) Prior Outpatient Therapy: Prior Outpatient Therapy: Yes Prior Therapy Dates:  ([facility]-past) Prior Therapy Facilty/Provider(s):  ([facility] ) Reason for Treatment:  (med managment ) Does patient have an ACCT team?: No Does patient have Intensive In-House Services?  : No Does patient have [facility] services? : No Does patient have P4CC services?: No  Current Facility-Administered Medications  Medication Dose Route Frequency Provider Last Rate Last Dose  . amLODipine (NORVASC) tablet 5 mg  5 mg Oral q morning - 10a Milton Ferguson, MD   5 mg at 08/19/14 0945   . aspirin EC tablet 81 mg  81 mg Oral q morning - 10a Milton Ferguson, MD   81 mg at 08/19/14 0945  . buPROPion (WELLBUTRIN XL) 24 hr tablet 300 mg  300 mg Oral q morning - 10a Milton Ferguson, MD   300 mg at 08/19/14 0945  . dicyclomine (BENTYL) tablet 20 mg  20 mg Oral QID Milton Ferguson, MD   20 mg at 08/19/14 0945  . divalproex (DEPAKOTE ER) 24 hr tablet 500 mg  500 mg Oral BID Milton Ferguson, MD   500 mg at 08/19/14 0945  . fenofibrate tablet 54 mg  54 mg Oral Daily Milton Ferguson, MD   54 mg at 08/19/14 0945  . gabapentin (NEURONTIN) capsule 300 mg  300 mg Oral QHS Milton Ferguson, MD   300 mg at 08/18/14 2133  . glipiZIDE (GLUCOTROL XL) 24 hr tablet 2.5  mg  2.5 mg Oral Q breakfast Milton Ferguson, MD   2.5 mg at 08/19/14 0820  . hydrOXYzine (ATARAX/VISTARIL) tablet 25 mg  25 mg Oral QID PRN Milton Ferguson, MD      . loratadine (CLARITIN) tablet 10 mg  10 mg Oral q morning - 10a Milton Ferguson, MD   10 mg at 08/18/14 1131  . lubiprostone (AMITIZA) capsule 24 mcg  24 mcg Oral BID WC Milton Ferguson, MD   24 mcg at 08/19/14 0820  . metoCLOPramide (REGLAN) tablet 10 mg  10 mg Oral TID AC & HS Milton Ferguson, MD   10 mg at 08/19/14 0820  . metoprolol tartrate (LOPRESSOR) tablet 12.5 mg  12.5 mg Oral BID Milton Ferguson, MD   12.5 mg at 08/19/14 0944  . ondansetron (ZOFRAN) tablet 4 mg  4 mg Oral Q6H PRN Milton Ferguson, MD      . pantoprazole (PROTONIX) EC tablet 40 mg  40 mg Oral Daily Milton Ferguson, MD   40 mg at 08/19/14 0945  . polyethylene glycol (MIRALAX / GLYCOLAX) packet 17 g  17 g Oral q morning - 10a Milton Ferguson, MD   17 g at 08/19/14 0944  . pravastatin (PRAVACHOL) tablet 20 mg  20 mg Oral q1800 Milton Ferguson, MD   20 mg at 08/18/14 1937  . QUEtiapine (SEROQUEL) tablet 100 mg  100 mg Oral BID Milton Ferguson, MD   100 mg at 08/19/14 0820  . QUEtiapine (SEROQUEL) tablet 600 mg  600 mg Oral QHS Milton Ferguson, MD   600 mg at 08/18/14 2136  . tiZANidine (ZANAFLEX) tablet 4 mg  4 mg Oral Q6H PRN Milton Ferguson, MD      . traMADol Veatrice Bourbon) tablet 50 mg  50 mg Oral TID PRN Milton Ferguson, MD      . vitamin B-12 (CYANOCOBALAMIN) tablet 1,000 mcg  1,000 mcg Oral q morning - 10a Milton Ferguson, MD   1,000 mcg at 08/19/14 0945  . Vitamin D (Ergocalciferol) (DRISDOL) capsule 50,000 Units  50,000 Units Oral Once per day on Tue Thu Milton Ferguson, MD   50,000 Units at 08/16/14 1517   Current Outpatient Prescriptions  Medication Sig Dispense Refill  . amLODipine (NORVASC) 5 MG tablet Take 1 tablet (5 mg total) by mouth every morning.    Marland Kitchen aspirin EC 81 MG tablet Take 1 tablet (81 mg total) by mouth every morning.    Marland Kitchen buPROPion (WELLBUTRIN XL) 300 MG 24 hr tablet Take 1 tablet (300 mg total) by mouth every morning. 30 tablet 0  . clonazePAM (KLONOPIN) 0.5 MG tablet Take 1 tablet (0.5 mg total) by mouth every 12 (twelve) hours as needed (For agitation.). 30 tablet 0  . dicyclomine (BENTYL) 20 MG tablet Take 1 tablet (20 mg total) by mouth 4 (four) times daily.    . divalproex (DEPAKOTE ER) 500 MG 24 hr tablet Take 1 tablet (500 mg total) by mouth 2 (two) times daily.    . fenofibrate 54 MG tablet Take 1 tablet (54 mg total) by mouth daily.    Marland Kitchen gabapentin (NEURONTIN) 300 MG capsule Take 1 capsule (300 mg total) by mouth at bedtime.    Marland Kitchen glipiZIDE (GLUCOTROL XL) 2.5 MG 24 hr tablet Take 1 tablet (2.5 mg total) by mouth daily with breakfast.    . hydrOXYzine (ATARAX/VISTARIL) 25 MG tablet Take 25 mg by mouth 4 (four) times daily as needed for anxiety.    . lansoprazole (PREVACID) 30 MG capsule Take 30 mg by  mouth 2 (two) times daily before a meal.    . loratadine (CLARITIN) 10 MG tablet Take 1 tablet (10 mg total) by mouth every morning.    . lubiprostone (AMITIZA) 24 MCG capsule Take 1 capsule (24 mcg total) by mouth 2 (two) times daily.    . metoCLOPramide (REGLAN) 10 MG tablet Take 1 tablet (10 mg total) by mouth 4 (four) times daily -  before meals and at bedtime.    . metoprolol tartrate (LOPRESSOR) 25 MG  tablet Take 0.5 tablets (12.5 mg total) by mouth 2 (two) times daily.    Marland Kitchen omeprazole (PRILOSEC) 20 MG capsule Take 1 capsule (20 mg total) by mouth every morning.    . ondansetron (ZOFRAN) 4 MG tablet Take 4 mg by mouth every 6 (six) hours as needed for nausea.    . polyethylene glycol (MIRALAX / GLYCOLAX) packet Take 17 g by mouth every morning. 14 each   . pravastatin (PRAVACHOL) 20 MG tablet Take 1 tablet (20 mg total) by mouth daily.    . QUEtiapine (SEROQUEL) 100 MG tablet Take 100 mg by mouth 2 (two) times daily.    . QUEtiapine (SEROQUEL) 200 MG tablet Take 3 tablets (600 mg total) by mouth at bedtime. 90 tablet 0  . testosterone cypionate (DEPOTESTOTERONE CYPIONATE) 100 MG/ML injection Inject 1 mL (100 mg total) into the muscle every 28 (twenty-eight) days. For IM use only 10 mL   . tiZANidine (ZANAFLEX) 4 MG tablet Take 4 mg by mouth every 6 (six) hours as needed for muscle spasms.    . traMADol (ULTRAM) 50 MG tablet Take by mouth 3 (three) times daily as needed (For abdominal pain.).    Marland Kitchen vitamin B-12 (CYANOCOBALAMIN) 1000 MCG tablet Take 1 tablet (1,000 mcg total) by mouth every morning.    . Vitamin D, Ergocalciferol, (DRISDOL) 50000 UNITS CAPS capsule Take 1 capsule (50,000 Units total) by mouth 2 (two) times a week. He takes on Tuesdays and Thursdays. 30 capsule     Musculoskeletal: Strength & Muscle Tone: SEEN IN BED SITTING Gait & Station: Seen in bed sitting Patient leans: seen in bed sitting  Psychiatric Specialty Exam: Physical Exam  Review of Systems  Unable to perform ROS: mental acuity    Blood pressure 97/67, pulse 89, temperature 97.7 F (36.5 C), temperature source Oral, resp. rate 16, SpO2 96 %.There is no weight on file to calculate BMI.  General Appearance: Casual  Eye Contact::  Good  Speech:  Garbled and minimal yes and no  Volume:  Decreased  Mood:  Euthymic and not speaking or expressing self.  Affect:  Congruent and Flat  Thought Process:  Linear,  Loose and off topic all the time, hx MR  Orientation:  Other:  UNABLE TO OBTAIN  Thought Content:  unable to obtain  Suicidal Thoughts:  No  Homicidal Thoughts:  No  Memory:  unable to obtain  Judgement:  Other:  fair judgement, hx of MR  Insight:  Shallow  Psychomotor Activity:  Normal  Concentration:  Fair  Recall:  Poor  Fund of Knowledge:Poor  Language: Fair  Akathisia:  NA  Handed:  Right  AIMS (if indicated):     Assets:  Desire for Improvement  ADL's:  Intact  Cognition: Impaired,  Severe  Sleep:      Medical Decision Making: Established Problem, Stable/Improving (1)   Disposition:  Discharge home  Shawn Baird   PMHNP-BC 08/19/2014 1:05 PM  Patient seen face-to-face for psychiatric evaluation along with  psychiatric nurse practitioner and case discussed with the treatment team. Patient has been calm and cooperative and compliant with medication and no active symptoms of depression or behavioral problems. Formulated disposition plan and reviewed the information documented and agree with the treatment plan.Shawn Grinder R. 08/19/2014 7:36 PM

## 2014-08-19 NOTE — Progress Notes (Addendum)
11:12am. CSW spoke with group home staff member, Senaida Lange [phone]), to update on pt status and inform of potential discharge today. Senaida Lange asked CSW to speak with Lear Ng, because he needs to approve pt's return since Shawn Baird is just line staff and just went on shift today. Shawn Baird provided [facility] phone number: [phone]. CSW attempted this phone number--this is the same number CSWs have been attempting all week. This CSW left message.CSW called Shawn Baird back. Shawn Baird also provided [phone], the number for the other property. When CSW called this number, received message that this number was out of service. CSW looked up other property phone number on-line: [phone]. CSW called this number and got answering machine with voice that sounded like Mr. Lear Ng, but did not specify name. CSW left message.  CSW went to meet with pt. Pt was calm and cooperative. Pt was able to answer some of CSWs questions, providing short 3-4 word answers. Pt initially able answer yes to CSW's question "Do you live at [facility]" and then pt volunteered that "He gets on my nerves." Pt provided name of this person, but unable to tell CSW if person is a staff member or resident. CSW forgot this person's name and asked pt to repeat it--pt unable to give CSW the name again. Pt able to state his name and birthday. Could not state where he was or where he was born. Pt also able to answer yes or no to questions, though CSW unable to determine when pt understands the question or not.   CSW to continue to follow.  [place] Worker [facility] phone: [phone]

## 2014-08-19 NOTE — Progress Notes (Signed)
11:45am. CSW received phone call from [employer]. CSW inquired about pt's baseline--pt has moderate MR and is hard of hearing. Richardson Landry states that when pt is off his baseline, he starts refusing hygiene and shortly after that medicine. CSW updated Richardson Landry that pt may be near baseline, as he has been taking meds and following appropriate hygiene.  Richardson Landry states if pt is discharged they will be able to pick him up.   [place] Worker [facility] phone: [phone]

## 2014-08-19 NOTE — Discharge Instructions (Signed)
AVS given to sister and e-signature signed by sister Mardene Celeste.

## 2015-01-17 ENCOUNTER — Encounter (HOSPITAL_COMMUNITY): Payer: Self-pay | Admitting: Emergency Medicine

## 2015-01-17 ENCOUNTER — Emergency Department (HOSPITAL_COMMUNITY)
Admission: EM | Admit: 2015-01-17 | Discharge: 2015-01-18 | Disposition: A | Discharge status: Home / Self Care | Payer: Medicare Other | Attending: Emergency Medicine | Admitting: Emergency Medicine

## 2015-01-17 DIAGNOSIS — F419 Anxiety disorder, unspecified: Secondary | ICD-10-CM | POA: Insufficient documentation

## 2015-01-17 DIAGNOSIS — Z8719 Personal history of other diseases of the digestive system: Secondary | ICD-10-CM | POA: Diagnosis not present

## 2015-01-17 DIAGNOSIS — E785 Hyperlipidemia, unspecified: Secondary | ICD-10-CM | POA: Insufficient documentation

## 2015-01-17 DIAGNOSIS — E119 Type 2 diabetes mellitus without complications: Secondary | ICD-10-CM | POA: Diagnosis not present

## 2015-01-17 DIAGNOSIS — M79672 Pain in left foot: Secondary | ICD-10-CM | POA: Diagnosis not present

## 2015-01-17 DIAGNOSIS — F259 Schizoaffective disorder, unspecified: Secondary | ICD-10-CM | POA: Insufficient documentation

## 2015-01-17 DIAGNOSIS — M79662 Pain in left lower leg: Secondary | ICD-10-CM | POA: Diagnosis present

## 2015-01-17 DIAGNOSIS — R200 Anesthesia of skin: Secondary | ICD-10-CM | POA: Diagnosis not present

## 2015-01-17 DIAGNOSIS — Z79899 Other long term (current) drug therapy: Secondary | ICD-10-CM | POA: Diagnosis not present

## 2015-01-17 DIAGNOSIS — Z7982 Long term (current) use of aspirin: Secondary | ICD-10-CM | POA: Insufficient documentation

## 2015-01-17 DIAGNOSIS — I10 Essential (primary) hypertension: Secondary | ICD-10-CM | POA: Diagnosis not present

## 2015-01-17 DIAGNOSIS — F79 Unspecified intellectual disabilities: Secondary | ICD-10-CM | POA: Insufficient documentation

## 2015-01-17 DIAGNOSIS — R202 Paresthesia of skin: Secondary | ICD-10-CM | POA: Diagnosis not present

## 2015-01-17 DIAGNOSIS — H9190 Unspecified hearing loss, unspecified ear: Secondary | ICD-10-CM | POA: Insufficient documentation

## 2015-01-17 NOTE — ED Notes (Signed)
Pt brought in by EMS from [facility] that is a group home for mentally disabled adults  Staff states pt has the mentality of a 54 year old and is hard of hearing  Staff states today the pt has been limping and complains that his left leg and foot have been tingling like it is asleep  Pt has been stamping foot on the floor saying that's the only way it feels better   Pt has no other complaints  No tenderness on palpation or swelling noted

## 2015-01-17 NOTE — ED Notes (Signed)
Bed: WTR6 Expected date:  Expected time:  Means of arrival:  Comments: EMS 54yo M foot and leg pain

## 2015-01-18 ENCOUNTER — Emergency Department (HOSPITAL_COMMUNITY): Payer: Medicare Other

## 2015-01-18 DIAGNOSIS — M79662 Pain in left lower leg: Secondary | ICD-10-CM | POA: Diagnosis not present

## 2015-01-18 LAB — CBG MONITORING, ED: Glucose-Capillary: 93 mg/dL (ref 65–99)

## 2015-01-18 MED ORDER — TRAMADOL HCL 50 MG PO TABS
50.0000 mg | ORAL_TABLET | Freq: Once | ORAL | Status: AC
  Administered 2015-01-18: 50 mg via ORAL
  Filled 2015-01-18: qty 1

## 2015-01-18 NOTE — ED Notes (Signed)
PTAR call for transport back to group home

## 2015-01-18 NOTE — Discharge Instructions (Signed)
Musculoskeletal Pain Musculoskeletal pain is muscle and boney aches and pains. These pains can occur in any part of the body. Your caregiver may treat you without knowing the cause of the pain. They may treat you if blood or urine tests, X-rays, and other tests were normal.  CAUSES There is often not a definite cause or reason for these pains. These pains may be caused by a type of germ (virus). The discomfort may also come from overuse. Overuse includes working out too hard when your body is not fit. Boney aches also come from weather changes. Bone is sensitive to atmospheric pressure changes. HOME CARE INSTRUCTIONS   Ask when your test results will be ready. Make sure you get your test results.  Only take over-the-counter or prescription medicines for pain, discomfort, or fever as directed by your caregiver. If you were given medications for your condition, do not drive, operate machinery or power tools, or sign legal documents for 24 hours. Do not drink alcohol. Do not take sleeping pills or other medications that may interfere with treatment.  Continue all activities unless the activities cause more pain. When the pain lessens, slowly resume normal activities. Gradually increase the intensity and duration of the activities or exercise.  During periods of severe pain, bed rest may be helpful. Lay or sit in any position that is comfortable.  Putting ice on the injured area.  Put ice in a bag.  Place a towel between your skin and the bag.  Leave the ice on for 15 to 20 minutes, 3 to 4 times a day.  Follow up with your caregiver for continued problems and no reason can be found for the pain. If the pain becomes worse or does not go away, it may be necessary to repeat tests or do additional testing. Your caregiver may need to look further for a possible cause. SEEK IMMEDIATE MEDICAL CARE IF:  You have pain that is getting worse and is not relieved by medications.  You develop chest pain  that is associated with shortness or breath, sweating, feeling sick to your stomach (nauseous), or throw up (vomit).  Your pain becomes localized to the abdomen.  You develop any new symptoms that seem different or that concern you. MAKE SURE YOU:   Understand these instructions.  Will watch your condition.  Will get help right away if you are not doing well or get worse.   This information is not intended to replace advice given to you by your health care provider. Make sure you discuss any questions you have with your health care provider.   Document Released: 01/12/2005 Document Revised: 04/06/2011 Document Reviewed: 09/16/2012 Elsevier Interactive Patient Education 2016 [place].  Cryotherapy Cryotherapy means treatment with cold. Ice or gel packs can be used to reduce both pain and swelling. Ice is the most helpful within the first 24 to 48 hours after an injury or flare-up from overusing a muscle or joint. Sprains, strains, spasms, burning pain, shooting pain, and aches can all be eased with ice. Ice can also be used when recovering from surgery. Ice is effective, has very few side effects, and is safe for most people to use. PRECAUTIONS  Ice is not a safe treatment option for people with:  Raynaud phenomenon. This is a condition affecting small blood vessels in the extremities. Exposure to cold may cause your problems to return.  Cold hypersensitivity. There are many forms of cold hypersensitivity, including:  Cold urticaria. Red, itchy hives appear on the  skin when the tissues begin to warm after being iced.  Cold erythema. This is a red, itchy rash caused by exposure to cold.  Cold hemoglobinuria. Red blood cells break down when the tissues begin to warm after being iced. The hemoglobin that carry oxygen are passed into the urine because they cannot combine with blood proteins fast enough.  Numbness or altered sensitivity in the area being iced. If you have any of the  following conditions, do not use ice until you have discussed cryotherapy with your caregiver:  Heart conditions, such as arrhythmia, angina, or chronic heart disease.  High blood pressure.  Healing wounds or open skin in the area being iced.  Current infections.  Rheumatoid arthritis.  Poor circulation.  Diabetes. Ice slows the blood flow in the region it is applied. This is beneficial when trying to stop inflamed tissues from spreading irritating chemicals to surrounding tissues. However, if you expose your skin to cold temperatures for too long or without the proper protection, you can damage your skin or nerves. Watch for signs of skin damage due to cold. HOME CARE INSTRUCTIONS Follow these tips to use ice and cold packs safely.  Place a dry or damp towel between the ice and skin. A damp towel will cool the skin more quickly, so you may need to shorten the time that the ice is used.  For a more rapid response, add gentle compression to the ice.  Ice for no more than 10 to 20 minutes at a time. The bonier the area you are icing, the less time it will take to get the benefits of ice.  Check your skin after 5 minutes to make sure there are no signs of a poor response to cold or skin damage.  Rest 20 minutes or more between uses.  Once your skin is numb, you can end your treatment. You can test numbness by very lightly touching your skin. The touch should be so light that you do not see the skin dimple from the pressure of your fingertip. When using ice, most people will feel these normal sensations in this order: cold, burning, aching, and numbness.  Do not use ice on someone who cannot communicate their responses to pain, such as small children or people with dementia. HOW TO MAKE AN ICE PACK Ice packs are the most common way to use ice therapy. Other methods include ice massage, ice baths, and cryosprays. Muscle creams that cause a cold, tingly feeling do not offer the same  benefits that ice offers and should not be used as a substitute unless recommended by your caregiver. To make an ice pack, do one of the following:  Place crushed ice or a bag of frozen vegetables in a sealable plastic bag. Squeeze out the excess air. Place this bag inside another plastic bag. Slide the bag into a pillowcase or place a damp towel between your skin and the bag.  Mix 3 parts water with 1 part rubbing alcohol. Freeze the mixture in a sealable plastic bag. When you remove the mixture from the freezer, it will be slushy. Squeeze out the excess air. Place this bag inside another plastic bag. Slide the bag into a pillowcase or place a damp towel between your skin and the bag. SEEK MEDICAL CARE IF:  You develop white spots on your skin. This may give the skin a blotchy (mottled) appearance.  Your skin turns blue or pale.  Your skin becomes waxy or hard.  Your swelling gets  worse. MAKE SURE YOU:   Understand these instructions.  Will watch your condition.  Will get help right away if you are not doing well or get worse.   This information is not intended to replace advice given to you by your health care provider. Make sure you discuss any questions you have with your health care provider.   Document Released: 09/08/2010 Document Revised: 02/02/2014 Document Reviewed: 09/08/2010 Elsevier Interactive Patient Education 2016 Ong therapy can help ease sore, stiff, injured, and tight muscles and joints. Heat relaxes your muscles, which may help ease your pain.  RISKS AND COMPLICATIONS If you have any of the following conditions, do not use heat therapy unless your health care provider has approved:  Poor circulation.  Healing wounds or scarred skin in the area being treated.  Diabetes, heart disease, or high blood pressure.  Not being able to feel (numbness) the area being treated.  Unusual swelling of the area being treated.  Active  infections.  Blood clots.  Cancer.  Inability to communicate pain. This may include young children and people who have problems with their brain function (dementia).  Pregnancy. Heat therapy should only be used on old, pre-existing, or long-lasting (chronic) injuries. Do not use heat therapy on new injuries unless directed by your health care provider. HOW TO USE HEAT THERAPY There are several different kinds of heat therapy, including:  Moist heat pack.  Warm water bath.  Hot water bottle.  Electric heating pad.  Heated gel pack.  Heated wrap.  Electric heating pad. Use the heat therapy method suggested by your health care provider. Follow your health care provider's instructions on when and how to use heat therapy. GENERAL HEAT THERAPY RECOMMENDATIONS  Do not sleep while using heat therapy. Only use heat therapy while you are awake.  Your skin may turn pink while using heat therapy. Do not use heat therapy if your skin turns red.  Do not use heat therapy if you have new pain.  High heat or long exposure to heat can cause burns. Be careful when using heat therapy to avoid burning your skin.  Do not use heat therapy on areas of your skin that are already irritated, such as with a rash or sunburn. SEEK MEDICAL CARE IF:  You have blisters, redness, swelling, or numbness.  You have new pain.  Your pain is worse. MAKE SURE YOU:  Understand these instructions.  Will watch your condition.  Will get help right away if you are not doing well or get worse.   This information is not intended to replace advice given to you by your health care provider. Make sure you discuss any questions you have with your health care provider.   Document Released: 04/06/2011 Document Revised: 02/02/2014 Document Reviewed: 03/07/2013 Elsevier Interactive Patient Education [employer].

## 2015-01-18 NOTE — ED Notes (Signed)
Patient sleeping, breathing even and unlabored, NAD at this time.

## 2015-01-18 NOTE — ED Provider Notes (Signed)
CSN: [id number]     Arrival date & time 01/17/15  2103 History   First MD Initiated Contact with Patient 01/17/15 2324     Chief Complaint  Patient presents with  . Leg Pain     (Consider location/radiation/quality/duration/timing/severity/associated sxs/prior Treatment) HPI   Patient is a 54 year old male with history of mental retardation, schizoaffective, anxiety, hypertension, hyperlipidemia, diabetes, who presents to the emergency room via EMS from [facility], a group home for mentally disabled adults, with complaints of left foot and leg pain, described as tingling and numbness, that is intermittent, and he states he feels like he is going to fall down.  History is severely limited by patient's baseline mental status.  He is unable to rate the severity of the pain.  He denies any injury. He is unable to tell me how long he has had these symptoms for.  Level 5 caveat due to MR  Past Medical History  Diagnosis Date  . Anxiety   . Hypertension   . Hyperlipemia   . Mental disorder   . Schizoaffective disorder ([place])   . Impulsive control disorder  . Pancreatitis   . Otitis media   . Cancer ([place])   . HOH (hard of hearing)   . Tourette's disease   . Mental retardation   . Chronic constipation   . Diabetes mellitus    Past Surgical History  Procedure Laterality Date  . 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 High blood pressure Sister   . High Cholesterol Sister   . Heart disease Mother   . Stroke Mother    Social History  Substance Use Topics  . Smoking status: Never Smoker   . Smokeless tobacco: Never Used  . Alcohol Use: No    Review of Systems  Unable to perform ROS: Psychiatric disorder (and mental retardation)  All other systems reviewed and are negative.     Allergies  Review of patient's allergies indicates no known allergies.  Home Medications   Prior to Admission  medications   Medication Sig Start Date End Date Taking? Authorizing Provider  amLODipine (NORVASC) 5 MG tablet Take 1 tablet (5 mg total) by mouth every morning. 10/14/13  Yes Patrecia Pour, NP  aspirin EC 81 MG tablet Take 1 tablet (81 mg total) by mouth every morning. 10/14/13  Yes Patrecia Pour, NP  buPROPion (WELLBUTRIN XL) 300 MG 24 hr tablet Take 1 tablet (300 mg total) by mouth every morning. 10/14/13  Yes Patrecia Pour, NP  dicyclomine (BENTYL) 20 MG tablet Take 1 tablet (20 mg total) by mouth 4 (four) times daily. 10/14/13  Yes Patrecia Pour, NP  divalproex (DEPAKOTE ER) 500 MG 24 hr tablet Take 1 tablet (500 mg total) by mouth 2 (two) times daily. 10/14/13  Yes Patrecia Pour, NP  fenofibrate 54 MG tablet Take 1 tablet (54 mg total) by mouth daily. 10/14/13  Yes Patrecia Pour, NP  gabapentin (NEURONTIN) 300 MG capsule Take 1 capsule (300 mg total) by mouth at bedtime. 10/14/13  Yes Patrecia Pour, NP  glipiZIDE (GLUCOTROL XL) 2.5 MG 24 hr tablet Take 1 tablet (2.5 mg total) by mouth daily with breakfast. 10/14/13  Yes Patrecia Pour, NP  lansoprazole (PREVACID) 30 MG capsule Take 30 mg by mouth 2 (two) times daily before a meal.   Yes Historical Provider, MD  loratadine (CLARITIN) 10 MG tablet Take 1 tablet (10 mg total) by mouth every morning. 10/14/13  Yes Patrecia Pour, NP  lubiprostone (AMITIZA) 24 MCG capsule Take 1 capsule (24 mcg total) by mouth 2 (two) times daily. 10/14/13  Yes Patrecia Pour, NP  metoCLOPramide (REGLAN) 10 MG tablet Take 1 tablet (10 mg total) by mouth 4 (four) times daily -  before meals and at bedtime. 10/14/13  Yes Patrecia Pour, NP  metoprolol tartrate (LOPRESSOR) 25 MG tablet Take 0.5 tablets (12.5 mg total) by mouth 2 (two) times daily. 10/14/13  Yes Patrecia Pour, NP  omeprazole (PRILOSEC) 20 MG capsule Take 1 capsule (20 mg total) by mouth every morning. 10/14/13  Yes Patrecia Pour, NP  pravastatin (PRAVACHOL) 20 MG tablet Take 1 tablet (20 mg total) by mouth  daily. 10/14/13  Yes Patrecia Pour, NP  QUEtiapine (SEROQUEL) 100 MG tablet Take 100 mg by mouth 2 (two) times daily.  01/07/15  Yes Historical Provider, MD  QUEtiapine (SEROQUEL) 200 MG tablet Take 3 tablets (600 mg total) by mouth at bedtime. Patient taking differently: Take 600 mg by mouth 2 (two) times daily.  10/14/13  Yes Patrecia Pour, NP  QUEtiapine (SEROQUEL) 300 MG tablet Take 600 mg by mouth at bedtime.   Yes Historical Provider, MD  vitamin B-12 (CYANOCOBALAMIN) 1000 MCG tablet Take 1 tablet (1,000 mcg total) by mouth every morning. 10/14/13  Yes Patrecia Pour, NP  Vitamin D, Ergocalciferol, (DRISDOL) 50000 UNITS CAPS capsule Take 1 capsule (50,000 Units total) by mouth 2 (two) times a week. He takes on Tuesdays and Thursdays. 10/14/13  Yes Patrecia Pour, NP  clonazePAM (KLONOPIN) 0.5 MG tablet Take 1 tablet (0.5 mg total) by mouth every 12 (twelve) hours as needed (For agitation.). 10/14/13   Patrecia Pour, NP  hydrOXYzine (ATARAX/VISTARIL) 25 MG tablet Take 25 mg by mouth 4 (four) times daily as needed for anxiety.    Historical Provider, MD  ondansetron (ZOFRAN) 4 MG tablet Take 4 mg by mouth every 6 (six) hours as needed for nausea.    Historical Provider, MD  polyethylene glycol (MIRALAX / GLYCOLAX) packet Take 17 g by mouth every morning. Patient not taking: Reported on 01/17/2015 10/14/13   Patrecia Pour, NP  testosterone cypionate (DEPOTESTOTERONE CYPIONATE) 100 MG/ML injection Inject 1 mL (100 mg total) into the muscle every 28 (twenty-eight) days. For IM use only 10/14/13   Patrecia Pour, NP  tiZANidine (ZANAFLEX) 4 MG tablet Take 4 mg by mouth every 6 (six) hours as needed for muscle spasms.    Historical Provider, MD  traMADol (ULTRAM) 50 MG tablet Take by mouth 3 (three) times daily as needed (For abdominal pain.).    Historical Provider, MD   BP 111/74 mmHg  Pulse 86  Temp(Src) 99 F (37.2 C) (Oral)  Resp 16  SpO2 94% Physical Exam  Constitutional: He appears  well-developed and well-nourished. No distress.  Well appearing male, seated comfortably on ER gurney, NAD  HENT:  Head: Normocephalic and atraumatic.  Right Ear: External ear normal.  Left Ear: External ear normal.  Nose: Nose normal.  Eyes: Conjunctivae and EOM are normal. Pupils are equal, round, and reactive to light. Right eye exhibits no discharge. Left eye exhibits no discharge. No scleral icterus.  Neck: Normal range of motion. Neck supple. No JVD present. No tracheal deviation present.  Cardiovascular: Normal rate and regular rhythm.   Pulses:      Dorsalis  pedis pulses are 2+ on the right side, and 2+ on the left side.  Posterior tibialis pulses not palpated, easily found with doppler  Pulmonary/Chest: Effort normal and breath sounds normal. No stridor. No respiratory distress.  Musculoskeletal: Normal range of motion. He exhibits tenderness. He exhibits no edema.       Left lower leg: He exhibits tenderness. He exhibits no bony tenderness, no swelling, no edema and no deformity.       Right foot: Normal.       Left foot: There is tenderness. There is normal range of motion, no bony tenderness, no swelling, normal capillary refill, no deformity and no laceration.  Lymphadenopathy:    He has no cervical adenopathy.  Neurological: He is alert. He exhibits normal muscle tone. Coordination normal.  Skin: Skin is warm and dry. No bruising, no ecchymosis, no laceration, no lesion and no rash noted. He is not diaphoretic. No cyanosis or erythema. No pallor. Nails show clubbing.  Mild symmetrical 1+ pretibial edema Normal capillary refill Bilateral LE shins with shiny atrophic skin, little leg hair LE warm to the touch, without pallor  Psychiatric: He has a normal mood and affect.  Nursing note and vitals reviewed.     ED Course  Procedures (including critical care time) Labs Review Labs Reviewed  CBG MONITORING, ED    Imaging Review Dg Foot Complete Left  01/18/2015   CLINICAL DATA:  54 year old male with left foot pain. EXAM: LEFT FOOT - COMPLETE 3+ VIEW COMPARISON:  None. FINDINGS: There is no acute fracture or dislocation. There is hallux valgus deformity. Partially visualized apparent flowing osteophyte along the distal cord cysts of the tibia and fibula which may be related to old healed fracture or represent melorheostosis. Mild soft tissue swelling of the midfoot. No radiopaque foreign object. IMPRESSION: No acute fracture or dislocation. Electronically Signed   By: Anner Crete M.D.   On: 01/18/2015 01:09   I have personally reviewed and evaluated these images and lab results as part of my medical decision-making.   EKG Interpretation None      MDM   Left foot and leg pain, pt was brought in by EMS, staff at group home where he lives reported limping and complaints of pain, tingling and numbness.   Pt has ambulated in the ER w/o limp or difficulty.  There is no apparent injury, bilateral LE and feet are without erythema, edema, wounds, or deformity.    Xray was obtained, negative for acute fx/dislocation.  POC CBG obtained, which was normal, unknown if pt has diabetic peripheral neuropathy. Last Hb A1C 01/20/13, 6.1 The pt's calves were measured, which revealed no asymmetry in circumference Pt had mild LE edema that was symmetrical ROM was normal bilaterally No palpable cord in left calf Dorsal pedis pulses 2+ and symmetrical Posterior tibialis bilaterally not palpated, but biphasic with doppler.  Otherwise, pt's LE were hairless, with LE skin shiny and atrophic, however normal capillary refill and warm to the touch, without pallor.  Lack of hair may be secondary to depakote use, or may a sign of PAD.  Pt does have risk factors for PAD, HTN, HLD, DM.  His sx today may be paresthesias, however there is no evidence of concern for acute ischemic limb, no ulceration, and no emergent medical condition requiring intervention tonight, and pt can safely  f/up with PCP for further work up. Pt is already receiving medical tx for PAD, including taking 81 mg ASA daily, as well as treatment of  DM, HTN, and HLD. Instructions were including on discharge paperwork to seek further eval from PCP for PAD, and information for vascular referral were also provided for f/up if needed.   Final diagnoses:  Pain of left lower leg  Left foot pain   Pt was discharged in satisfactory condition, VSS  Medications  traMADol (ULTRAM) tablet 50 mg (50 mg Oral Given 01/18/15 0014)   Filed Vitals:   01/17/15 2324 01/18/15 0110  BP: 120/81 111/74  Pulse: 102 86  Temp:    Resp: 16 [address] PA-C [id number]  Delora Fuel, MD [id number]

## 2020-09-12 ENCOUNTER — Emergency Department (HOSPITAL_COMMUNITY)
Admission: EM | Admit: 2020-09-12 | Discharge: 2020-09-12 | Disposition: A | Discharge status: Home / Self Care | Payer: Medicare Other | Attending: Emergency Medicine | Admitting: Emergency Medicine

## 2020-09-12 ENCOUNTER — Other Ambulatory Visit: Payer: Self-pay

## 2020-09-12 ENCOUNTER — Encounter (HOSPITAL_COMMUNITY): Payer: Self-pay

## 2020-09-12 ENCOUNTER — Emergency Department (HOSPITAL_COMMUNITY): Payer: Medicare Other

## 2020-09-12 DIAGNOSIS — R059 Cough, unspecified: Secondary | ICD-10-CM | POA: Insufficient documentation

## 2020-09-12 DIAGNOSIS — R0981 Nasal congestion: Secondary | ICD-10-CM | POA: Diagnosis not present

## 2020-09-12 DIAGNOSIS — Z7984 Long term (current) use of oral hypoglycemic drugs: Secondary | ICD-10-CM | POA: Insufficient documentation

## 2020-09-12 DIAGNOSIS — Z7982 Long term (current) use of aspirin: Secondary | ICD-10-CM | POA: Insufficient documentation

## 2020-09-12 DIAGNOSIS — E1169 Type 2 diabetes mellitus with other specified complication: Secondary | ICD-10-CM | POA: Diagnosis not present

## 2020-09-12 DIAGNOSIS — Z20822 Contact with and (suspected) exposure to covid-19: Secondary | ICD-10-CM | POA: Insufficient documentation

## 2020-09-12 DIAGNOSIS — Z859 Personal history of malignant neoplasm, unspecified: Secondary | ICD-10-CM | POA: Insufficient documentation

## 2020-09-12 DIAGNOSIS — I10 Essential (primary) hypertension: Secondary | ICD-10-CM | POA: Insufficient documentation

## 2020-09-12 DIAGNOSIS — Z79899 Other long term (current) drug therapy: Secondary | ICD-10-CM | POA: Insufficient documentation

## 2020-09-12 LAB — RESP PANEL BY RT-PCR (FLU A&B, COVID) ARPGX2
Influenza A by PCR: NEGATIVE
Influenza B by PCR: NEGATIVE
SARS Coronavirus 2 by RT PCR: NEGATIVE

## 2020-09-12 NOTE — ED Provider Notes (Signed)
[facility] EMERGENCY DEPARTMENT Provider Note   CSN: [id number] Arrival date & time: 09/12/20  1420     History Chief Complaint  Patient presents with   Cough    Shawn Baird is a 60 y.o. male.  60 year old male with history of intellectual disability, schizoaffective disorder, diabetes with additional history as listed below, notably patient is nonverbal, brought in by EMS from [facility] care home for cough and congestion for a few weeks.  Per EMS, the facility is concerned the patient may have COVID.  Patient is well-appearing, nods yes to some questions answered otherwise does not reply, level 5 caveat applies.      Past Medical History:  Diagnosis Date   Anxiety    Cancer ([place])    Chronic constipation    Diabetes mellitus    HOH (hard of hearing)    Hyperlipemia    Hypertension    Impulsive control disorder   Mental disorder    Mental retardation    Otitis media    Pancreatitis    Schizoaffective disorder ([place])    Tourette's disease     Patient Active Problem List   Diagnosis Date Noted   Schizoaffective disorder, depressive type ([place])    Mental retardation 08/26/2012   Aggressive behavior of adult 06/28/2012   Gastroparesis 03/22/2012   Transaminitis 03/11/2012   Abdominal  pain, other specified site with nausea and vomiting, likely due to viral gastroenteritis versus diabetic gastroparesis 01/15/2012   Diabetes ([place]) 01/15/2012   Hypertension 01/15/2012   Hyperlipidemia 01/15/2012   Schizoaffective disorder ([place]) 01/15/2012   Chronic constipation 01/15/2012   GERD (gastroesophageal reflux disease) 01/15/2012   Left Renal calcification 01/15/2012    Past Surgical History:  Procedure Laterality Date   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 Authorizing Provider  amLODipine (NORVASC) 5 MG tablet Take 1 tablet (5 mg total) by mouth every morning. 10/14/13   Patrecia Pour, NP  aspirin EC 81 MG tablet Take 1 tablet (81 mg total) by mouth every morning. 10/14/13   Patrecia Pour, NP  buPROPion (WELLBUTRIN XL) 300 MG 24 hr tablet Take 1 tablet (300 mg total) by mouth every morning. 10/14/13   Patrecia Pour, NP  clonazePAM (KLONOPIN) 0.5 MG tablet Take 1 tablet (0.5 mg total) by mouth every 12 (twelve) hours as needed (For agitation.). 10/14/13   Patrecia Pour, NP  dicyclomine (BENTYL) 20 MG tablet Take 1 tablet (20 mg total) by mouth 4 (four) times daily. 10/14/13   Patrecia Pour, NP  divalproex (DEPAKOTE ER) 500 MG 24 hr tablet Take 1 tablet (500 mg total) by mouth 2 (two) times daily. 10/14/13   Patrecia Pour, NP  fenofibrate 54 MG tablet Take 1 tablet (54 mg total) by mouth daily. 10/14/13   Patrecia Pour, NP  gabapentin (NEURONTIN) 300 MG capsule Take 1 capsule (300 mg total) by mouth at bedtime. 10/14/13   Waylan Boga  Y, NP  glipiZIDE (GLUCOTROL XL) 2.5 MG 24 hr tablet Take 1 tablet (2.5 mg total) by mouth daily with breakfast. 10/14/13   Patrecia Pour, NP  hydrOXYzine (ATARAX/VISTARIL) 25 MG tablet Take 25 mg by mouth 4 (four) times daily as needed for anxiety.    [provider]  lansoprazole (PREVACID) 30 MG capsule Take 30 mg by mouth 2 (two) times daily before a meal.    [provider]  loratadine (CLARITIN) 10 MG tablet Take 1 tablet (10 mg total) by mouth every morning. 10/14/13   Patrecia Pour, NP  lubiprostone (AMITIZA) 24 MCG capsule Take 1 capsule (24 mcg total) by mouth 2 (two) times daily. 10/14/13   Patrecia Pour, NP  metoCLOPramide (REGLAN) 10 MG tablet Take 1 tablet (10 mg total) by mouth 4  (four) times daily -  before meals and at bedtime. 10/14/13   Patrecia Pour, NP  metoprolol tartrate (LOPRESSOR) 25 MG tablet Take 0.5 tablets (12.5 mg total) by mouth 2 (two) times daily. 10/14/13   Patrecia Pour, NP  omeprazole (PRILOSEC) 20 MG capsule Take 1 capsule (20 mg total) by mouth every morning. 10/14/13   Patrecia Pour, NP  ondansetron (ZOFRAN) 4 MG tablet Take 4 mg by mouth every 6 (six) hours as needed for nausea.    [provider]  polyethylene glycol (MIRALAX / GLYCOLAX) packet Take 17 g by mouth every morning. Patient not taking: Reported on 01/17/2015 10/14/13   Patrecia Pour, NP  pravastatin (PRAVACHOL) 20 MG tablet Take 1 tablet (20 mg total) by mouth daily. 10/14/13   Patrecia Pour, NP  QUEtiapine (SEROQUEL) 100 MG tablet Take 100 mg by mouth 2 (two) times daily.  01/07/15   [provider]  QUEtiapine (SEROQUEL) 200 MG tablet Take 3 tablets (600 mg total) by mouth at bedtime. Patient taking differently: Take 600 mg by mouth 2 (two) times daily.  10/14/13   Patrecia Pour, NP  QUEtiapine (SEROQUEL) 300 MG tablet Take 600 mg by mouth at bedtime.    [provider]  testosterone cypionate (DEPOTESTOTERONE CYPIONATE) 100 MG/ML injection Inject 1 mL (100 mg total) into the muscle every 28 (twenty-eight) days. For IM use only 10/14/13   Lord, Asa Saunas, NP  tiZANidine (ZANAFLEX) 4 MG tablet Take 4 mg by mouth every 6 (six) hours as needed for muscle spasms.    [provider]  traMADol (ULTRAM) 50 MG tablet Take by mouth 3 (three) times daily as needed (For abdominal pain.).    [provider]  vitamin B-12 (CYANOCOBALAMIN) 1000 MCG tablet Take 1 tablet (1,000 mcg total) by mouth every morning. 10/14/13   Patrecia Pour, NP  Vitamin D, Ergocalciferol, (DRISDOL) 50000 UNITS CAPS capsule Take 1 capsule (50,000 Units total) by mouth 2 (two) times a week. He takes on Tuesdays and Thursdays. 10/14/13   Patrecia Pour, NP    Allergies     Patient has no known allergies.  Review of Systems   Review of Systems  Unable to perform ROS: Patient nonverbal   Physical Exam Updated Vital Signs BP (!) 118/96   Pulse 74   Temp 98.1 F (36.7 C)   Resp 18   Ht '5\' 5"'$  (1.651 m)   SpO2 98%   BMI 26.89 kg/m   Physical Exam Vitals and nursing note reviewed.  Constitutional:      General: He is not in acute distress.    Appearance: He is well-developed.  He is not diaphoretic.  HENT:     Head: Normocephalic and atraumatic.     Right Ear: There is impacted cerumen.     Left Ear: There is impacted cerumen.     Nose: Nose normal.     Mouth/Throat:     Mouth: Mucous membranes are moist.     Pharynx: No oropharyngeal exudate or posterior oropharyngeal erythema.  Eyes:     Conjunctiva/sclera: Conjunctivae normal.  Cardiovascular:     Rate and Rhythm: Normal rate and regular rhythm.     Pulses: Normal pulses.     Heart sounds: Normal heart sounds.  Pulmonary:     Effort: Pulmonary effort is normal.     Breath sounds: Normal breath sounds.  Abdominal:     Palpations: Abdomen is soft.     Tenderness: There is no abdominal tenderness.  Musculoskeletal:        General: No swelling or deformity.     Cervical back: Neck supple.  Skin:    General: Skin is warm and dry.     Findings: No erythema or rash.  Neurological:     Mental Status: He is alert and oriented to person, place, and time.  Psychiatric:        Behavior: Behavior normal.    ED Results / Procedures / Treatments   Labs (all labs ordered are listed, but only abnormal results are displayed) Labs Reviewed  RESP PANEL BY RT-PCR (FLU A&B, COVID) [id number]    EKG None  Radiology DG Chest 2 View  Result Date: 09/12/2020 CLINICAL DATA:  Cough. Congestion. Assisted living facility is concerned for possible covid. Pt is nonverbal. Resp even and unlabored. EXAM: CHEST - 2 VIEW COMPARISON:  Chest x-ray 12/26/2018, CT chest 12/26/2018 FINDINGS: The heart size and  mediastinal contours are within normal limits. Low lung volumes with bibasilar atelectasis. Linear atelectasis versus scarring within the lingula and at the left base. No focal consolidation. Slightly increased interstitial markings with no overt pulmonary edema. No pleural effusion. No pneumothorax. No acute osseous abnormality. IMPRESSION: Low lung volumes with no active cardiopulmonary disease. Electronically Signed   By: Iven Finn M.D.   On: 09/12/2020 15:29    Procedures Procedures   Medications Ordered in ED Medications - No data to display  ED Course  I have reviewed the triage vital signs and the nursing notes.  Pertinent labs & imaging results that were available during my care of the patient were reviewed by me and considered in my medical decision making (see chart for details).  Clinical Course as of 09/12/20 1608  Thu Sep 13, 8346  [id number] 60 year old male with report of cough and congestion per EMS from facility.  Attempted to call facility for information, no answer at facility.  Patient is well-appearing, vitals reassuring, O2 sat 98% on room air.  Lungs are clear.  Plan is to obtain chest x-ray and COVID swab. [LM]  Shelley test is negative.  Chest x-ray unremarkable.  Patient is discharged back to facility, recheck PCP as needed. [LM]    Clinical Course User Index [LM] Roque Lias   MDM Rules/Calculators/A&P                           Final Clinical Impression(s) / ED Diagnoses Final diagnoses:  Cough    Rx / DC Orders ED Discharge Orders     None        Tacy Learn, PA-C  09/12/20 1608    Luna Fuse, MD 09/13/20 1442

## 2020-09-12 NOTE — Discharge Instructions (Addendum)
COVID test negative.  Chest x-ray unremarkable.  Recommend recheck with your primary care provider as needed.

## 2020-09-12 NOTE — ED Triage Notes (Addendum)
Pt brought in by ems from [facility] care home for cough and congestion for few weeks.  Assisted living facility is concerned for possible covid.  Pt is nonverbal.  Resp even and unlabored.  Skin warm and dry.  Pt has hx of mental retardation.  Vss by ems no distress.

## 2022-01-26 DIAGNOSIS — E039 Hypothyroidism, unspecified: Secondary | ICD-10-CM | POA: Insufficient documentation

## 2022-01-26 DIAGNOSIS — N3946 Mixed incontinence: Secondary | ICD-10-CM | POA: Insufficient documentation

## 2022-01-26 DIAGNOSIS — H9190 Unspecified hearing loss, unspecified ear: Secondary | ICD-10-CM | POA: Insufficient documentation

## 2022-03-15 DIAGNOSIS — R269 Unspecified abnormalities of gait and mobility: Secondary | ICD-10-CM | POA: Insufficient documentation

## 2022-03-15 DIAGNOSIS — R41841 Cognitive communication deficit: Secondary | ICD-10-CM | POA: Insufficient documentation

## 2022-03-16 ENCOUNTER — Encounter (HOSPITAL_COMMUNITY): Payer: Self-pay

## 2022-03-16 ENCOUNTER — Emergency Department (HOSPITAL_COMMUNITY)
Admission: EM | Admit: 2022-03-16 | Discharge: 2022-03-17 | Disposition: A | Discharge status: Nursing Home (Includes ICF) | Payer: 59 | Attending: Emergency Medicine | Admitting: Emergency Medicine

## 2022-03-16 ENCOUNTER — Other Ambulatory Visit: Payer: Self-pay

## 2022-03-16 DIAGNOSIS — R000 Tachycardia, unspecified: Secondary | ICD-10-CM | POA: Insufficient documentation

## 2022-03-16 DIAGNOSIS — Z7982 Long term (current) use of aspirin: Secondary | ICD-10-CM | POA: Insufficient documentation

## 2022-03-16 DIAGNOSIS — R1084 Generalized abdominal pain: Secondary | ICD-10-CM | POA: Insufficient documentation

## 2022-03-16 DIAGNOSIS — R1033 Periumbilical pain: Secondary | ICD-10-CM | POA: Diagnosis present

## 2022-03-16 MED ORDER — LACTATED RINGERS IV BOLUS
1000.0000 mL | Freq: Once | INTRAVENOUS | Status: AC
  Administered 2022-03-17: 1000 mL via INTRAVENOUS

## 2022-03-16 MED ORDER — MORPHINE SULFATE (PF) 4 MG/ML IV SOLN
4.0000 mg | Freq: Once | INTRAVENOUS | Status: AC
  Administered 2022-03-17: 4 mg via INTRAVENOUS
  Filled 2022-03-16: qty 1

## 2022-03-16 NOTE — ED Triage Notes (Addendum)
Pt presents from [facility] c/o abdominal pain x couple hours.. Pain is in lower abdomen. Pt has cough present. Hx of mental retardation.

## 2022-03-16 NOTE — ED Provider Notes (Signed)
[facility] Provider Note   CSN: [id number] Arrival date & time: 03/16/22  2317     History {Add pertinent medical, surgical, social history, OB history to HPI:1} Chief Complaint  Patient presents with   Abdominal Pain    Shawn Baird is a 62 y.o. male.  Patient sent to the emergency department from skilled nursing facility for evaluation of abdominal pain.  He has been complaining of abdominal pain for several hours.  Patient points to the area just above his umbilicus.  Patient with prior diagnosis of mental retardation, does not elaborate on his pain or give any more information.       Home Medications Prior to Admission medications   Medication Sig Start Date End Date Taking? Authorizing Provider  amLODipine (NORVASC) 5 MG tablet Take 1 tablet (5 mg total) by mouth every morning. 10/14/13   Patrecia Pour, NP  aspirin EC 81 MG tablet Take 1 tablet (81 mg total) by mouth every morning. 10/14/13   Patrecia Pour, NP  buPROPion (WELLBUTRIN XL) 300 MG 24 hr tablet Take 1 tablet (300 mg total) by mouth every morning. 10/14/13   Patrecia Pour, NP  clonazePAM (KLONOPIN) 0.5 MG tablet Take 1 tablet (0.5 mg total) by mouth every 12 (twelve) hours as needed (For agitation.). 10/14/13   Patrecia Pour, NP  dicyclomine (BENTYL) 20 MG tablet Take 1 tablet (20 mg total) by mouth 4 (four) times daily. 10/14/13   Patrecia Pour, NP  divalproex (DEPAKOTE ER) 500 MG 24 hr tablet Take 1 tablet (500 mg total) by mouth 2 (two) times daily. 10/14/13   Patrecia Pour, NP  fenofibrate 54 MG tablet Take 1 tablet (54 mg total) by mouth daily. 10/14/13   Patrecia Pour, NP  gabapentin (NEURONTIN) 300 MG capsule Take 1 capsule (300 mg total) by mouth at bedtime. 10/14/13   Patrecia Pour, NP  glipiZIDE (GLUCOTROL XL) 2.5 MG 24 hr tablet Take 1 tablet (2.5 mg total) by mouth daily with breakfast. 10/14/13   Patrecia Pour, NP  hydrOXYzine (ATARAX/VISTARIL) 25  MG tablet Take 25 mg by mouth 4 (four) times daily as needed for anxiety.    [provider]  lansoprazole (PREVACID) 30 MG capsule Take 30 mg by mouth 2 (two) times daily before a meal.    [provider]  loratadine (CLARITIN) 10 MG tablet Take 1 tablet (10 mg total) by mouth every morning. 10/14/13   Patrecia Pour, NP  lubiprostone (AMITIZA) 24 MCG capsule Take 1 capsule (24 mcg total) by mouth 2 (two) times daily. 10/14/13   Patrecia Pour, NP  metoCLOPramide (REGLAN) 10 MG tablet Take 1 tablet (10 mg total) by mouth 4 (four) times daily -  before meals and at bedtime. 10/14/13   Patrecia Pour, NP  metoprolol tartrate (LOPRESSOR) 25 MG tablet Take 0.5 tablets (12.5 mg total) by mouth 2 (two) times daily. 10/14/13   Patrecia Pour, NP  omeprazole (PRILOSEC) 20 MG capsule Take 1 capsule (20 mg total) by mouth every morning. 10/14/13   Patrecia Pour, NP  ondansetron (ZOFRAN) 4 MG tablet Take 4 mg by mouth every 6 (six) hours as needed for nausea.    [provider]  polyethylene glycol (MIRALAX / GLYCOLAX) packet Take 17 g by mouth every morning. Patient not taking: Reported on 01/17/2015 10/14/13   Patrecia Pour, NP  pravastatin (PRAVACHOL) 20 MG tablet Take 1 tablet (  20 mg total) by mouth daily. 10/14/13   Patrecia Pour, NP  QUEtiapine (SEROQUEL) 100 MG tablet Take 100 mg by mouth 2 (two) times daily.  01/07/15   [provider]  QUEtiapine (SEROQUEL) 200 MG tablet Take 3 tablets (600 mg total) by mouth at bedtime. Patient taking differently: Take 600 mg by mouth 2 (two) times daily.  10/14/13   Patrecia Pour, NP  QUEtiapine (SEROQUEL) 300 MG tablet Take 600 mg by mouth at bedtime.    [provider]  testosterone cypionate (DEPOTESTOTERONE CYPIONATE) 100 MG/ML injection Inject 1 mL (100 mg total) into the muscle every 28 (twenty-eight) days. For IM use only 10/14/13   Lord, Asa Saunas, NP  tiZANidine (ZANAFLEX) 4 MG tablet Take 4 mg by mouth every  6 (six) hours as needed for muscle spasms.    [provider]  traMADol (ULTRAM) 50 MG tablet Take by mouth 3 (three) times daily as needed (For abdominal pain.).    [provider]  vitamin B-12 (CYANOCOBALAMIN) 1000 MCG tablet Take 1 tablet (1,000 mcg total) by mouth every morning. 10/14/13   Patrecia Pour, NP  Vitamin D, Ergocalciferol, (DRISDOL) 50000 UNITS CAPS capsule Take 1 capsule (50,000 Units total) by mouth 2 (two) times a week. He takes on Tuesdays and Thursdays. 10/14/13   Patrecia Pour, NP      Allergies    Patient has no known allergies.    Review of Systems   Review of Systems  Physical Exam Updated Vital Signs BP (!) 128/92   Pulse (!) 108   Temp 98.3 F (36.8 C) (Oral)   Resp 19   SpO2 98%  Physical Exam Vitals and nursing note reviewed.  Constitutional:      General: He is not in acute distress.    Appearance: He is well-developed.  HENT:     Head: Normocephalic and atraumatic.     Mouth/Throat:     Mouth: Mucous membranes are moist.  Eyes:     General: Vision grossly intact. Gaze aligned appropriately.     Extraocular Movements: Extraocular movements intact.     Conjunctiva/sclera: Conjunctivae normal.  Cardiovascular:     Rate and Rhythm: Regular rhythm. Tachycardia present.     Pulses: Normal pulses.     Heart sounds: Normal heart sounds, S1 normal and S2 normal. No murmur heard.    No friction rub. No gallop.  Pulmonary:     Effort: Pulmonary effort is normal. No respiratory distress.     Breath sounds: Normal breath sounds.  Abdominal:     Palpations: Abdomen is soft.     Tenderness: There is generalized abdominal tenderness. There is no guarding or rebound.     Hernia: No hernia is present.  Musculoskeletal:        General: No swelling.     Cervical back: Full passive range of motion without pain, normal range of motion and neck supple. No pain with movement, spinous process tenderness or muscular tenderness. Normal range of  motion.     Right lower leg: No edema.     Left lower leg: No edema.  Skin:    General: Skin is warm and dry.     Capillary Refill: Capillary refill takes less than 2 seconds.     Findings: No ecchymosis, erythema, lesion or wound.  Neurological:     Mental Status: He is alert and oriented to person, place, and time.     GCS: GCS eye subscore is 4.  GCS verbal subscore is 5. GCS motor subscore is 6.     Cranial Nerves: Cranial nerves 2-12 are intact.     Sensory: Sensation is intact.     Motor: Motor function is intact. No weakness or abnormal muscle tone.     Coordination: Coordination is intact.  Psychiatric:        Mood and Affect: Mood normal.        Speech: Speech normal.        Behavior: Behavior normal.     ED Results / Procedures / Treatments   Labs (all labs ordered are listed, but only abnormal results are displayed) Labs Reviewed  LIPASE, BLOOD  COMPREHENSIVE METABOLIC PANEL  CBC  URINALYSIS, ROUTINE W REFLEX MICROSCOPIC    EKG None  Radiology No results found.  Procedures Procedures  {Document cardiac monitor, telemetry assessment procedure when appropriate:1}  Medications Ordered in ED Medications - No data to display  ED Course/ Medical Decision Making/ A&P   {   Click here for ABCD2, HEART and other calculatorsREFRESH Note before signing :1}                          Medical Decision Making Amount and/or Complexity of Data Reviewed Labs: ordered.   ***  {Document critical care time when appropriate:1} {Document review of labs and clinical decision tools ie heart score, Chads2Vasc2 etc:1}  {Document your independent review of radiology images, and any outside records:1} {Document your discussion with family members, caretakers, and with consultants:1} {Document social determinants of health affecting pt's care:1} {Document your decision making why or why not admission, treatments were needed:1} Final Clinical Impression(s) / ED  Diagnoses Final diagnoses:  None    Rx / DC Orders ED Discharge Orders     None

## 2022-03-17 ENCOUNTER — Emergency Department (HOSPITAL_COMMUNITY): Payer: 59

## 2022-03-17 DIAGNOSIS — R1084 Generalized abdominal pain: Secondary | ICD-10-CM | POA: Diagnosis not present

## 2022-03-17 LAB — LACTIC ACID, PLASMA: Lactic Acid, Venous: 1.9 mmol/L (ref 0.5–1.9)

## 2022-03-17 LAB — URINALYSIS, W/ REFLEX TO CULTURE (INFECTION SUSPECTED)
Bilirubin Urine: NEGATIVE
Glucose, UA: NEGATIVE mg/dL
Hgb urine dipstick: NEGATIVE
Leukocytes,Ua: NEGATIVE
Nitrite: NEGATIVE
Protein, ur: NEGATIVE mg/dL
Specific Gravity, Urine: 1.02 (ref 1.005–1.030)
pH: 6.5 (ref 5.0–8.0)

## 2022-03-17 LAB — CBC
HCT: 37.5 % — ABNORMAL LOW (ref 39.0–52.0)
Hemoglobin: 12.4 g/dL — ABNORMAL LOW (ref 13.0–17.0)
MCH: 29.1 pg (ref 26.0–34.0)
MCHC: 33.1 g/dL (ref 30.0–36.0)
MCV: 88 fL (ref 80.0–100.0)
Platelets: 282 10*3/uL (ref 150–400)
RBC: 4.26 MIL/uL (ref 4.22–5.81)
RDW: 13.1 % (ref 11.5–15.5)
WBC: 6.3 10*3/uL (ref 4.0–10.5)
nRBC: 0 % (ref 0.0–0.2)

## 2022-03-17 LAB — COMPREHENSIVE METABOLIC PANEL
ALT: 18 U/L (ref 0–44)
AST: 26 U/L (ref 15–41)
Albumin: 3.6 g/dL (ref 3.5–5.0)
Alkaline Phosphatase: 48 U/L (ref 38–126)
Anion gap: 9 (ref 5–15)
BUN: 14 mg/dL (ref 8–23)
CO2: 22 mmol/L (ref 22–32)
Calcium: 8.6 mg/dL — ABNORMAL LOW (ref 8.9–10.3)
Chloride: 105 mmol/L (ref 98–111)
Creatinine, Ser: 1.38 mg/dL — ABNORMAL HIGH (ref 0.61–1.24)
GFR, Estimated: 58 mL/min — ABNORMAL LOW (ref >60)
Glucose, Bld: 140 mg/dL — ABNORMAL HIGH (ref 70–99)
Potassium: 3.6 mmol/L (ref 3.5–5.1)
Sodium: 136 mmol/L (ref 135–145)
Total Bilirubin: 0.4 mg/dL (ref 0.3–1.2)
Total Protein: 7 g/dL (ref 6.5–8.1)

## 2022-03-17 LAB — TROPONIN I (HIGH SENSITIVITY)
Troponin I (High Sensitivity): 3 ng/L (ref <18)
Troponin I (High Sensitivity): 4 ng/L (ref <18)

## 2022-03-17 LAB — LIPASE, BLOOD: Lipase: 50 U/L (ref 11–51)

## 2022-03-17 MED ORDER — IOHEXOL 300 MG/ML  SOLN
100.0000 mL | Freq: Once | INTRAMUSCULAR | Status: AC | PRN
  Administered 2022-03-17: 100 mL via INTRAVENOUS

## 2022-03-17 NOTE — ED Notes (Signed)
Patient Alert and oriented to baseline. Stable and ambulatory to baseline. Patient verbalized understanding of the discharge instructions.  Patient belongings were taken by the patient.   

## 2022-04-07 ENCOUNTER — Encounter: Payer: Self-pay | Admitting: Internal Medicine

## 2022-05-02 NOTE — Progress Notes (Unsigned)
Referring Provider:*** Primary Care Physician:  [name], MD Primary Gastroenterologist:  Dr. Bonnetta Barry chief complaint on file.   HPI:   Shawn Baird is a 62 y.o. male presenting today at the request of *** for abdominal pain.   See in the ER 03/16/22 for epigastric abdominal pain. Hgb slightly low at 12.4, otherwise, CMP and lipase wnl. CT A/P with contrast with no acute findings.   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     aspirin EC 81 MG tablet Take 1 tablet (81 mg total) by mouth every morning.     buPROPion (WELLBUTRIN XL) 300 MG 24 hr tablet Take 1 tablet (300 mg total) by mouth every morning. 30 tablet 0   clonazePAM (KLONOPIN) 0.5 MG tablet Take 1 tablet (0.5 mg total) by mouth every 12 (twelve) hours as needed (For agitation.). 30 tablet 0   dicyclomine (BENTYL) 20 MG tablet Take 1 tablet (20 mg total) by mouth 4 (four) times daily.     divalproex (DEPAKOTE ER) 500 MG 24 hr tablet Take 1 tablet (500 mg total) by mouth 2 (two) times daily.     fenofibrate 54 MG tablet Take 1 tablet (54 mg total) by mouth daily.     gabapentin (NEURONTIN) 300 MG capsule Take 1 capsule (300 mg total) by mouth at bedtime.     glipiZIDE (GLUCOTROL XL) 2.5 MG 24 hr tablet Take 1 tablet (2.5 mg total) by mouth daily with breakfast.     hydrOXYzine (ATARAX/VISTARIL) 25 MG tablet Take 25 mg by mouth 4 (four) times daily as needed for  anxiety.     lansoprazole (PREVACID) 30 MG capsule Take 30 mg by mouth 2 (two) times daily before a meal.     loratadine (CLARITIN) 10 MG tablet Take 1 tablet (10 mg total) by mouth every morning.     lubiprostone (AMITIZA) 24 MCG capsule Take 1 capsule (24 mcg total) by mouth 2 (two) times daily.     metoCLOPramide (REGLAN) 10 MG tablet Take 1 tablet (10 mg total) by mouth 4 (four) times daily -  before meals and at bedtime.     metoprolol tartrate (LOPRESSOR) 25 MG tablet Take 0.5 tablets (12.5 mg total) by mouth 2 (two) times daily.     omeprazole (PRILOSEC) 20 MG capsule Take 1 capsule (20 mg total) by mouth every morning.     ondansetron (ZOFRAN) 4 MG tablet Take 4 mg by mouth every 6 (six) hours as needed for nausea.     polyethylene glycol (MIRALAX / GLYCOLAX) packet Take 17 g by mouth every morning. (Patient not taking: Reported on 01/17/2015) 14 each    pravastatin (PRAVACHOL) 20 MG tablet Take 1 tablet (20 mg total) by mouth daily.     QUEtiapine (SEROQUEL) 100 MG  tablet Take 100 mg by mouth 2 (two) times daily.      QUEtiapine (SEROQUEL) 200 MG tablet Take 3 tablets (600 mg total) by mouth at bedtime. (Patient taking differently: Take 600 mg by mouth 2 (two) times daily. ) 90 tablet 0   QUEtiapine (SEROQUEL) 300 MG tablet Take 600 mg by mouth at bedtime.     testosterone cypionate (DEPOTESTOTERONE CYPIONATE) 100 MG/ML injection Inject 1 mL (100 mg total) into the muscle every 28 (twenty-eight) days. For IM use only 10 mL    tiZANidine (ZANAFLEX) 4 MG tablet Take 4 mg by mouth every 6 (six) hours as needed for muscle spasms.     traMADol (ULTRAM) 50 MG tablet Take by mouth 3 (three) times daily as needed (For abdominal pain.).     vitamin B-12 (CYANOCOBALAMIN) 1000 MCG tablet Take 1 tablet (1,000 mcg total) by mouth every morning.     Vitamin D, Ergocalciferol, (DRISDOL) 50000 UNITS CAPS capsule Take 1 capsule (50,000 Units total) by mouth 2 (two) times a week. He takes on Tuesdays and  Thursdays. 30 capsule    No current facility-administered medications for this visit.    Allergies as of 05/06/2022   (No Known Allergies)    Family History  Problem Relation Age of Onset   High blood pressure Sister    High Cholesterol Sister    Heart disease Mother    Stroke Mother     Social History   Socioeconomic History   Marital status: Single    Spouse name: Not on file   Number of children: Not on file   Years of education: Not on file   Highest education level: Not on file  Occupational History   Not on file  Tobacco Use   Smoking status: Never   Smokeless tobacco: Never  Substance and Sexual Activity   Alcohol use: No   Drug use: No   Sexual activity: Never  Other Topics Concern   Not on file  Social History Narrative   [facility]   Social Determinants of Health   Financial Resource Strain: Not on file  Food Insecurity: Not on file  Transportation Needs: Not on file  Physical Activity: Not on file  Stress: Not on file  Social Connections: Not on file  Intimate Partner Violence: Not on file    Review of Systems: Gen: Denies any fever, chills, fatigue, weight loss, lack of appetite.  CV: Denies chest pain, heart palpitations, peripheral edema, syncope.  Resp: Denies shortness of breath at rest or with exertion. Denies wheezing or cough.  GI: Denies dysphagia or odynophagia. Denies jaundice, hematemesis, fecal incontinence. GU : Denies urinary burning, urinary frequency, urinary hesitancy MS: Denies joint pain, muscle weakness, cramps, or limitation of movement.  Derm: Denies rash, itching, dry skin Psych: Denies depression, anxiety, memory loss, and confusion Heme: Denies bruising, bleeding, and enlarged lymph nodes.  Physical Exam: There were no vitals taken for this visit. General:   Alert and oriented. Pleasant and cooperative. Well-nourished and well-developed.  Head:  Normocephalic and atraumatic. Eyes:  Without icterus,  sclera clear and conjunctiva pink.  Ears:  Normal auditory acuity. Lungs:  Clear to auscultation bilaterally. No wheezes, rales, or rhonchi. No distress.  Heart:  S1, S2 present without murmurs appreciated.  Abdomen:  +BS, soft, non-tender and non-distended. No HSM noted. No guarding or rebound. No masses appreciated.  Rectal:  Deferred  Msk:  Symmetrical without gross deformities. Normal posture. Extremities:  Without edema. Neurologic:  Alert and  oriented x4;  grossly normal neurologically. Skin:  Intact without significant lesions or rashes. Psych:  Alert and cooperative. Normal mood and affect.    Assessment:     Plan:  ***   Ermalinda MemosKristen , PA-C [facility] 05/06/2022

## 2022-05-06 ENCOUNTER — Encounter: Payer: Self-pay | Admitting: Gastroenterology

## 2022-05-06 ENCOUNTER — Ambulatory Visit (INDEPENDENT_AMBULATORY_CARE_PROVIDER_SITE_OTHER): Payer: 59 | Admitting: Gastroenterology

## 2022-05-06 VITALS — BP 112/79 | HR 100 | Temp 97.6°F

## 2022-05-06 DIAGNOSIS — K5909 Other constipation: Secondary | ICD-10-CM | POA: Diagnosis not present

## 2022-05-06 DIAGNOSIS — D649 Anemia, unspecified: Secondary | ICD-10-CM | POA: Diagnosis not present

## 2022-05-06 DIAGNOSIS — Z1211 Encounter for screening for malignant neoplasm of colon: Secondary | ICD-10-CM

## 2022-05-06 MED ORDER — LINACLOTIDE 290 MCG PO CAPS
290.0000 ug | ORAL_CAPSULE | Freq: Every day | ORAL | 5 refills | Status: DC
Start: 2022-05-06 — End: 2022-10-30

## 2022-05-06 NOTE — Patient Instructions (Addendum)
Please have blood work completed.  Stop Linzess 145 mcg.  Start Linzess 290 mcg daily before breakfast.  Drink at least 64 ounces of water daily.  We will plan to schedule a colonoscopy in the near future with Dr. Jena Gauss.  I will need to reach out to your sister to discuss before scheduling you.  It was nice to see you today!   Ermalinda Memos, PA-C [facility]

## 2022-05-07 LAB — IRON,TIBC AND FERRITIN PANEL
Iron: 74 ug/dL (ref 38–169)
Total Iron Binding Capacity: 413 ug/dL (ref 250–450)

## 2022-05-08 ENCOUNTER — Telehealth: Payer: Self-pay | Admitting: Gastroenterology

## 2022-05-08 LAB — IRON,TIBC AND FERRITIN PANEL
Ferritin: 141 ng/mL (ref 30–400)
Iron Saturation: 18 % (ref 15–55)
UIBC: 339 ug/dL (ref 111–343)

## 2022-05-08 LAB — B12 AND FOLATE PANEL
Folate: 6.7 ng/mL (ref >3.0)
Vitamin B-12: 456 pg/mL (ref 232–1245)

## 2022-05-08 NOTE — Telephone Encounter (Signed)
Spoke with patient's sister, Doristine Counter, who is agreeable for patient to proceed with colonoscopy.   Would like to ensure that bowels are moving better prior to scheduling colonoscopy.  Courtney: Can you reach out to patient's facility and talk with patient's nurse to see if he has had improvement in his constipation with increasing Linzess to 290 mcg daily?  Is he having daily bowel movements?  If so, we will plan to proceed with a colonoscopy for screening purposes.

## 2022-05-11 ENCOUNTER — Other Ambulatory Visit: Payer: Self-pay | Admitting: Gastroenterology

## 2022-05-11 DIAGNOSIS — K5909 Other constipation: Secondary | ICD-10-CM

## 2022-05-11 MED ORDER — POLYETHYLENE GLYCOL 3350 17 G PO PACK
17.0000 g | PACK | Freq: Two times a day (BID) | ORAL | 3 refills | Status: DC
Start: 2022-05-11 — End: 2022-05-18

## 2022-05-11 NOTE — Telephone Encounter (Signed)
Recommend adding MiraLAX 17g twice daily in water or other non-carbonated beverage that patient likes.   Please call in 1-2 weeks with a progress report.

## 2022-05-11 NOTE — Telephone Encounter (Signed)
Spoke to nurse at facility and she states that pt is still having a hard time having a bowel movement. He is having 2 or 3 a week but they are hard stools and a big pile. She states he is takinig Linzess daily.

## 2022-05-11 NOTE — Telephone Encounter (Signed)
Spoke to nurse, informed of recommendations. She voiced understanding.

## 2022-05-17 ENCOUNTER — Emergency Department (HOSPITAL_COMMUNITY)
Admission: EM | Admit: 2022-05-17 | Discharge: 2022-05-18 | Disposition: A | Discharge status: Home / Self Care | Payer: 59 | Attending: Emergency Medicine | Admitting: Emergency Medicine

## 2022-05-17 ENCOUNTER — Encounter (HOSPITAL_COMMUNITY): Payer: Self-pay | Admitting: Emergency Medicine

## 2022-05-17 ENCOUNTER — Other Ambulatory Visit: Payer: Self-pay

## 2022-05-17 DIAGNOSIS — Z7982 Long term (current) use of aspirin: Secondary | ICD-10-CM | POA: Diagnosis not present

## 2022-05-17 DIAGNOSIS — R109 Unspecified abdominal pain: Secondary | ICD-10-CM | POA: Diagnosis present

## 2022-05-17 DIAGNOSIS — K5900 Constipation, unspecified: Secondary | ICD-10-CM | POA: Insufficient documentation

## 2022-05-17 LAB — CBC
HCT: 35.6 % — ABNORMAL LOW (ref 39.0–52.0)
Hemoglobin: 11.8 g/dL — ABNORMAL LOW (ref 13.0–17.0)
MCH: 29 pg (ref 26.0–34.0)
MCHC: 33.1 g/dL (ref 30.0–36.0)
MCV: 87.5 fL (ref 80.0–100.0)
Platelets: 257 10*3/uL (ref 150–400)
RBC: 4.07 MIL/uL — ABNORMAL LOW (ref 4.22–5.81)
RDW: 13.2 % (ref 11.5–15.5)
WBC: 6.6 10*3/uL (ref 4.0–10.5)
nRBC: 0 % (ref 0.0–0.2)

## 2022-05-17 NOTE — ED Provider Notes (Signed)
[place] EMERGENCY DEPARTMENT AT [facility]  Provider Note  CSN: [id number] Arrival date & time: 05/17/22 2312  History Chief Complaint  Patient presents with   Abdominal Pain    Shawn Baird is a 62 y.o. male with history of mental retardation, hard of hearing and chronic constipation sent to the ED via EMS from [facility] for abdominal pain. Patient does not provide any history. Staff at ALF report he complains of pain about once a week or so but today was requesting to come to the hospital. The staff I spoke with did not know how often he was having bowel movements but thinks it has been once or twice a week recently. He was seen in the GI office on 4/10 and had linzess increased. They are also working to schedule a screening colonoscopy but that has not been done yet.    Home Medications Prior to Admission medications   Medication Sig Start Date End Date Taking? Authorizing Provider  magnesium citrate SOLN Take 296 mLs (1 Bottle total) by mouth once for 1 dose. 05/18/22 05/18/22 Yes Pollyann Savoy, MD  polyethylene glycol powder (GLYCOLAX/MIRALAX) 17 GM/SCOOP powder Take 17 g by mouth 3 (three) times daily for 3 days, THEN 17 g 2 (two) times daily for 10 days. 05/18/22 05/31/22 Yes Pollyann Savoy, MD  amLODipine (NORVASC) 5 MG tablet Take 1 tablet (5 mg total) by mouth every morning. 10/14/13   Charm Rings, NP  aspirin EC 81 MG tablet Take 1 tablet (81 mg total) by mouth every morning. 10/14/13   Charm Rings, NP  atorvastatin (LIPITOR) 40 MG tablet Take 40 mg by mouth daily.    [provider]  buPROPion (WELLBUTRIN XL) 300 MG 24 hr tablet Take 1 tablet (300 mg total) by mouth every morning. 10/14/13   Charm Rings, NP  clonazePAM (KLONOPIN) 0.5 MG tablet Take 1 tablet (0.5 mg total) by mouth every 12 (twelve) hours as needed (For agitation.). 10/14/13   Charm Rings, NP  dicyclomine (BENTYL) 20 MG tablet Take 1 tablet (20 mg total) by mouth 4  (four) times daily. 10/14/13   Charm Rings, NP  divalproex (DEPAKOTE ER) 500 MG 24 hr tablet Take 1 tablet (500 mg total) by mouth 2 (two) times daily. 10/14/13   Charm Rings, NP  fenofibrate 54 MG tablet Take 1 tablet (54 mg total) by mouth daily. 10/14/13   Charm Rings, NP  gabapentin (NEURONTIN) 300 MG capsule Take 1 capsule (300 mg total) by mouth at bedtime. 10/14/13   Charm Rings, NP  haloperidol (HALDOL) 5 MG tablet Take 5 mg by mouth 2 (two) times daily.    [provider]  linaclotide Karlene Einstein) 290 MCG CAPS capsule Take 1 capsule (290 mcg total) by mouth daily before breakfast. 05/06/22   Letta Median, PA-C  metFORMIN (GLUCOPHAGE) 500 MG tablet Take by mouth 2 (two) times daily with a meal.    [provider]  metoprolol tartrate (LOPRESSOR) 25 MG tablet Take 0.5 tablets (12.5 mg total) by mouth 2 (two) times daily. 10/14/13   Charm Rings, NP  omeprazole (PRILOSEC) 20 MG capsule Take 1 capsule (20 mg total) by mouth every morning. 10/14/13   Charm Rings, NP  OVER THE COUNTER MEDICATION 50 mg 1 day or 1 dose. Zinc 50 mg tablet    [provider]  QUEtiapine (SEROQUEL) 100 MG tablet Take 100 mg by mouth 2 (two)  times daily.  01/07/15   [provider]  QUEtiapine (SEROQUEL) 200 MG tablet Take 3 tablets (600 mg total) by mouth at bedtime. Patient taking differently: Take 600 mg by mouth 2 (two) times daily. 10/14/13   Charm Rings, NP  QUEtiapine (SEROQUEL) 300 MG tablet Take 600 mg by mouth at bedtime.    [provider]  traZODone (DESYREL) 100 MG tablet Take 100 mg by mouth at bedtime.    [provider]  Vitamin D, Ergocalciferol, (DRISDOL) 50000 UNITS CAPS capsule Take 1 capsule (50,000 Units total) by mouth 2 (two) times a week. He takes on Tuesdays and Thursdays. 10/14/13   Charm Rings, NP     Allergies    Patient has no known allergies.   Review of Systems   Review of Systems Please see HPI for  pertinent positives and negatives  Physical Exam BP 130/84   Pulse 90   Temp 98 F (36.7 C)   Resp 16   Ht  (1.651 m)   Wt 73 kg   SpO2 98%   BMI 26.78 kg/m   Physical Exam Vitals and nursing note reviewed.  Constitutional:      Appearance: Normal appearance.  HENT:     Head: Normocephalic and atraumatic.     Nose: Nose normal.     Mouth/Throat:     Mouth: Mucous membranes are moist.  Eyes:     Extraocular Movements: Extraocular movements intact.     Conjunctiva/sclera: Conjunctivae normal.  Cardiovascular:     Rate and Rhythm: Normal rate.  Pulmonary:     Effort: Pulmonary effort is normal.     Breath sounds: Normal breath sounds.  Abdominal:     General: Abdomen is flat.     Palpations: Abdomen is soft.     Tenderness: There is no abdominal tenderness. There is no guarding. Negative signs include Murphy's sign and McBurney's sign.  Musculoskeletal:        General: No swelling. Normal range of motion.     Cervical back: Neck supple.  Skin:    General: Skin is warm and dry.  Neurological:     General: No focal deficit present.     Mental Status: He is alert.  Psychiatric:        Mood and Affect: Mood normal.     ED Results / Procedures / Treatments   EKG None  Procedures Procedures  Medications Ordered in the ED Medications - No data to display  Initial Impression and Plan  Patient here with abdominal pain, limited history from patient or from ALF staff. Has had similar in the past attributed to constipation. Abdomen is benign. Vitals are normal. Will check labs and xray of abdomen. May need additional bowel medications to promote more frequent bowel movements.   ED Course   Clinical Course as of 05/18/22 0102  Wynelle Link May 17, 2022  2358 CBC with mild anemia, about at baseline.  [CS]  Mon May 18, 2022  0013 CMP and lipase at baseline.  [CS]  0056 I personally viewed the images from radiology studies and agree with radiologist interpretation: Xray  neg for obstruction, stool in R colon is likely the cause of his continued symptoms. Recommend miralax and mag citrate until bowels moving regularly. GI follow up for Colonoscopy [CS]    Clinical Course User Index [CS] Pollyann Savoy, MD     MDM Rules/Calculators/A&P Medical Decision Making Problems Addressed: Constipation, unspecified constipation type: chronic illness or injury  Amount and/or Complexity of Data Reviewed Labs: ordered. Decision-making details documented in ED Course. Radiology: ordered and independent interpretation performed. Decision-making details documented in ED Course.  Risk OTC drugs.     Final Clinical Impression(s) / ED Diagnoses Final diagnoses:  Constipation, unspecified constipation type    Rx / DC Orders ED Discharge Orders          Ordered    polyethylene glycol powder (GLYCOLAX/MIRALAX) 17 GM/SCOOP powder  Multiple Frequencies        05/18/22 0100    magnesium citrate SOLN   Once        05/18/22 0100             Pollyann Savoy, MD 05/18/22 [phone]

## 2022-05-17 NOTE — ED Triage Notes (Signed)
Pt from [facility] with c/o upper abdominal pain.

## 2022-05-17 NOTE — ED Notes (Signed)
ED Provider at bedside. 

## 2022-05-17 NOTE — ED Notes (Signed)
Pt given urinal  Pt voices understanding of sample needed  RN at bedside during this time

## 2022-05-18 ENCOUNTER — Emergency Department (HOSPITAL_COMMUNITY): Payer: 59

## 2022-05-18 DIAGNOSIS — K5900 Constipation, unspecified: Secondary | ICD-10-CM | POA: Diagnosis not present

## 2022-05-18 LAB — COMPREHENSIVE METABOLIC PANEL
ALT: 15 U/L (ref 0–44)
AST: 22 U/L (ref 15–41)
Albumin: 3.5 g/dL (ref 3.5–5.0)
Alkaline Phosphatase: 44 U/L (ref 38–126)
Anion gap: 10 (ref 5–15)
BUN: 12 mg/dL (ref 8–23)
CO2: 22 mmol/L (ref 22–32)
Calcium: 8.6 mg/dL — ABNORMAL LOW (ref 8.9–10.3)
Chloride: 102 mmol/L (ref 98–111)
Creatinine, Ser: 1.35 mg/dL — ABNORMAL HIGH (ref 0.61–1.24)
GFR, Estimated: 59 mL/min — ABNORMAL LOW (ref >60)
Glucose, Bld: 101 mg/dL — ABNORMAL HIGH (ref 70–99)
Potassium: 3.7 mmol/L (ref 3.5–5.1)
Sodium: 134 mmol/L — ABNORMAL LOW (ref 135–145)
Total Bilirubin: 0.5 mg/dL (ref 0.3–1.2)
Total Protein: 7.2 g/dL (ref 6.5–8.1)

## 2022-05-18 LAB — LIPASE, BLOOD: Lipase: 58 U/L — ABNORMAL HIGH (ref 11–51)

## 2022-05-18 MED ORDER — POLYETHYLENE GLYCOL 3350 17 GM/SCOOP PO POWD: ORAL | 0 refills | Status: DC

## 2022-05-18 MED ORDER — MAGNESIUM CITRATE PO SOLN: 1.0000 | Freq: Once | ORAL | 0 refills | Status: AC

## 2022-05-18 NOTE — Discharge Instructions (Addendum)
Please increase Miralax dosing to three times a day and use a bottle of Magnesium Citrate until bowels move. Then return to his usual doses.

## 2022-05-18 NOTE — ED Notes (Signed)
Notified [facility] of patient needing transportation back to [facility].

## 2022-05-18 NOTE — ED Notes (Signed)
[facility] arrived to take Pt back to facility.  Paperwork provided.

## 2022-05-18 NOTE — ED Notes (Signed)
Report given to [facility] @ [facility]

## 2022-05-20 ENCOUNTER — Other Ambulatory Visit: Payer: Self-pay | Admitting: Gastroenterology

## 2022-05-20 ENCOUNTER — Telehealth: Payer: Self-pay | Admitting: Internal Medicine

## 2022-05-20 DIAGNOSIS — K5909 Other constipation: Secondary | ICD-10-CM

## 2022-05-20 MED ORDER — POLYETHYLENE GLYCOL 3350 17 GM/SCOOP PO POWD
17.0000 g | Freq: Two times a day (BID) | ORAL | 3 refills | Status: DC
Start: 2022-05-20 — End: 2023-07-08

## 2022-05-20 NOTE — Telephone Encounter (Signed)
Spoke to nurse at facility and she informed me that pt is taking Linzess and MiraLax twice daily. She states pt is having bowel movements daily.

## 2022-05-20 NOTE — Telephone Encounter (Signed)
Spoke to nurse and she states he is not taking the dicylomine.

## 2022-05-20 NOTE — Telephone Encounter (Signed)
Spoke with med tech.  Since being seen in the hospital, patient had to drink a bottle of magnesium citrate and take MiraLAX 3 times a day for 3 days, then was advised to decrease MiraLAX back to twice daily for 10 days.  Since drinking magnesium citrate and taking MiraLAX 3 times a day, patient has had several large bowel movements and is feeling back to himself.  I have recommended continuing with MiraLAX twice daily indefinitely with his Linzess 290 mcg daily.  Med tech says that they will need a new order.  We will fax this to them.  We will also proceed with scheduling colonoscopy as planned.  Courtney:  Can you fax MiraLAX Rx to facility. Fax # [phone].   Mandy/Tammy: Please arrange colonoscopy with propofol with Dr. Jena Gauss. Dx: Colon cancer screening.  ASA 2 Extra one half bowel prep 1 day prior to receiving: Hold evening dose of metformin. Day of procedure: Do not take any morning diabetes medications.

## 2022-05-20 NOTE — Telephone Encounter (Signed)
Prescription was faxed

## 2022-05-20 NOTE — Telephone Encounter (Signed)
Patient had OV with [facility] on 05/06/2022 and has been back to the ER for abdominal pain. I have received referral from ER. Please advise if he needs to come back in or if we can recommend something for him. [phone]

## 2022-05-20 NOTE — Telephone Encounter (Signed)
I do not think we need to see him again in the office quite yet. I think we can make some adjustments to bowel regimen first. We need to verify if he is taking dicyclomine. If so, recommend we hold this for now as this is likely contributing to his persistent constipation.    If not taking dicyclomine, I will have different recommendations.

## 2022-05-25 NOTE — Telephone Encounter (Signed)
Called # listed, line rang numerous times, no answer and no VM

## 2022-05-29 MED ORDER — PEG 3350-KCL-NA BICARB-NACL 420 G PO SOLR: 4000.0000 mL | Freq: Once | ORAL | 0 refills | Status: AC

## 2022-05-29 MED ORDER — BISACODYL 5 MG PO TBEC: DELAYED_RELEASE_TABLET | ORAL | 0 refills | Status: DC

## 2022-05-29 MED ORDER — FLEET ENEMA 7-19 GM/118ML RE ENEM: ENEMA | RECTAL | 0 refills | Status: DC

## 2022-05-29 NOTE — Telephone Encounter (Signed)
Spoke with [facility]. He has been scheduled for 6/25. Aware will fax instructions and send rx to pharmacy.

## 2022-05-29 NOTE — Addendum Note (Signed)
Addended by: Armstead Peaks on: 05/29/2022 10:09 AM   Modules accepted: Orders

## 2022-07-14 ENCOUNTER — Ambulatory Visit (INDEPENDENT_AMBULATORY_CARE_PROVIDER_SITE_OTHER): Payer: 59 | Admitting: Diagnostic Neuroimaging

## 2022-07-14 ENCOUNTER — Telehealth: Payer: Self-pay | Admitting: Diagnostic Neuroimaging

## 2022-07-14 ENCOUNTER — Encounter: Payer: Self-pay | Admitting: Diagnostic Neuroimaging

## 2022-07-14 VITALS — BP 99/74 | HR 77 | Ht 61.0 in | Wt 161.6 lb

## 2022-07-14 DIAGNOSIS — R269 Unspecified abnormalities of gait and mobility: Secondary | ICD-10-CM

## 2022-07-14 NOTE — Patient Instructions (Addendum)
   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

## 2022-07-14 NOTE — Progress Notes (Signed)
[facility]  PATIENT: Shawn Baird DOB: 01/03/1961  REFERRING CLINICIAN: Christene Lye, FNP HISTORY FROM: patient REASON FOR VISIT: new consult   HISTORICAL  CHIEF COMPLAINT:  Chief Complaint  Patient presents with   New Patient (Initial Visit)    Patient in room #7 with his caregiver.  Patient caregiver states he only walk short step for a short amount of time.    HISTORY OF PRESENT ILLNESS:   62 year old male here for evaluation of gait and balance difficulty.  History of schizoaffective disorder, hypothyroidism, hearing loss, has lived at [facility] breast home for past 4 years.  In past 4 months has had onset of gait and balance difficulty, shortness of breath, dyspnea on exertion, anxiety and fear of falling.  He seems to do better when he is pushing a wheelchair to help him walk.  He feels unsteady and has been taking short shuffling steps.   REVIEW OF SYSTEMS: Full 14 system review of systems performed and negative with exception of: as per HPI.  ALLERGIES: No Known Allergies  HOME MEDICATIONS: Outpatient Medications Prior to Visit  Medication Sig Dispense Refill   amLODipine (NORVASC) 5 MG tablet Take 1 tablet (5 mg total) by mouth every morning.     aspirin EC 81 MG tablet Take 1 tablet (81 mg total) by mouth every morning.     atorvastatin (LIPITOR) 40 MG tablet Take 40 mg by mouth daily.     divalproex (DEPAKOTE ER) 500 MG 24 hr tablet Take 1 tablet (500 mg total) by mouth 2 (two) times daily.     ezetimibe (ZETIA) 10 MG tablet Take 10 mg by mouth daily.     fenofibrate 54 MG tablet Take 1 tablet (54 mg total) by mouth daily.     gabapentin (NEURONTIN) 300 MG capsule Take 1 capsule (300 mg total) by mouth at bedtime.     haloperidol (HALDOL) 5 MG tablet Take 5 mg by mouth 2 (two) times daily.     levothyroxine (SYNTHROID) 25 MCG tablet Take 25 mcg by mouth daily.     linaclotide (LINZESS) 290 MCG CAPS capsule Take 1 capsule (290 mcg total)  by mouth daily before breakfast. 30 capsule 5   loratadine (CLARITIN) 10 MG tablet Take 10 mg by mouth daily.     metFORMIN (GLUCOPHAGE) 500 MG tablet Take by mouth 2 (two) times daily with a meal.     metoprolol succinate (TOPROL-XL) 25 MG 24 hr tablet Take 12.5 mg by mouth daily.     metoprolol tartrate (LOPRESSOR) 25 MG tablet Take 0.5 tablets (12.5 mg total) by mouth 2 (two) times daily.     omeprazole (PRILOSEC) 20 MG capsule Take 1 capsule (20 mg total) by mouth every morning.     OVER THE COUNTER MEDICATION 50 mg 1 day or 1 dose. Zinc 50 mg tablet     polyethylene glycol powder (GLYCOLAX/MIRALAX) 17 GM/SCOOP powder Take 17 g by mouth 2 (two) times daily. 850 g 3   QUEtiapine (SEROQUEL) 100 MG tablet Take 100 mg by mouth 2 (two) times daily.      QUEtiapine (SEROQUEL) 200 MG tablet Take 3 tablets (600 mg total) by mouth at bedtime. (Patient taking differently: Take 600 mg by mouth 2 (two) times daily.) 90 tablet 0   QUEtiapine (SEROQUEL) 300 MG tablet Take 600 mg by mouth at bedtime.     sodium phosphate (FLEET) 7-19 GM/118ML ENEM As directed 133 mL 0   traZODone (DESYREL) 100 MG tablet Take  100 mg by mouth at bedtime.     Vitamin D, Ergocalciferol, (DRISDOL) 50000 UNITS CAPS capsule Take 1 capsule (50,000 Units total) by mouth 2 (two) times a week. He takes on Tuesdays and Thursdays. 30 capsule    bisacodyl (DULCOLAX) 5 MG EC tablet As directed (Patient not taking: Reported on 07/14/2022) 4 tablet 0   buPROPion (WELLBUTRIN XL) 300 MG 24 hr tablet Take 1 tablet (300 mg total) by mouth every morning. (Patient not taking: Reported on 07/14/2022) 30 tablet 0   clonazePAM (KLONOPIN) 0.5 MG tablet Take 1 tablet (0.5 mg total) by mouth every 12 (twelve) hours as needed (For agitation.). (Patient not taking: Reported on 07/14/2022) 30 tablet 0   dicyclomine (BENTYL) 20 MG tablet Take 1 tablet (20 mg total) by mouth 4 (four) times daily. (Patient not taking: Reported on 07/14/2022)     No  facility-administered medications prior to visit.    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     FAMILY HISTORY: Family History  Problem Relation Age of Onset   High blood pressure Sister    High Cholesterol Sister    Heart disease Mother    Stroke Mother     SOCIAL HISTORY: Social History   Socioeconomic History   Marital status: Single    Spouse name: Not on file   Number of children: Not on file   Years of education: Not on file   Highest education level: Not on file  Occupational History   Not on file  Tobacco Use   Smoking status: Never   Smokeless tobacco: Never  Substance and Sexual Activity   Alcohol use: No   Drug use: No   Sexual activity: Never  Other Topics Concern   Not on file  Social History Narrative   [facility]   Social Determinants of Health   Financial Resource Strain: Not on file  Food Insecurity: Not on file  Transportation Needs: Not on file  Physical Activity: Not on file  Stress: Not on file  Social Connections: Not on file  Intimate Partner Violence: Not on file     PHYSICAL EXAM  GENERAL EXAM/CONSTITUTIONAL: Vitals:  Vitals:   07/14/22 1102  BP: 99/74  Pulse: 77  Weight: 161 lb 9.6 oz (73.3 kg)  Height: 5\' 1"  (1.549 m)   Body mass index is 30.53 kg/m. Wt Readings from Last 3 Encounters:  07/14/22 161 lb 9.6 oz (73.3 kg)  05/17/22 160 lb 15 oz (73 kg)  03/22/12 161 lb 9.6 oz (73.3 kg)   Patient is in no distress; well developed, nourished  and groomed; neck is supple  CARDIOVASCULAR: Examination of carotid arteries is normal; no carotid bruits Regular rate and rhythm, no murmurs Examination of peripheral vascular system by observation and palpation is normal  EYES: Ophthalmoscopic exam of optic discs and posterior segments is normal; no papilledema or hemorrhages No  results found.  MUSCULOSKELETAL: Gait, strength, tone, movements noted in Neurologic exam below  NEUROLOGIC: MENTAL STATUS:      No data to display         awake, alert, oriented to person; ABLE TO READ LIPS SLIGHTLY; COMPREHENSION LIMITED; VERBAL OUTPUT LIMITED   CRANIAL NERVE:  2nd - no papilledema on fundoscopic exam 2nd, 3rd, 4th, 6th - pupils equal and reactive to light, visual fields full to confrontation, extraocular muscles intact, no nystagmus 5th - facial sensation symmetric 7th - facial strength symmetric 8th - hearing --> DEAF 9th - palate elevates symmetrically, uvula midline 11th - shoulder shrug symmetric 12th - tongue protrusion midline  MOTOR:  normal bulk and tone, full strength in the BUE, BLE SLIGHTLY INCREASED TONE; NO TREMOR; NO BRADYKINESIA  SENSORY:  normal and symmetric to light touch  COORDINATION:  finger-nose-finger, fine finger movements normal  REFLEXES:  deep tendon reflexes TRACE and symmetric  GAIT/STATION:  narrow based gait; SHORT STEPS     DIAGNOSTIC DATA (LABS, IMAGING, TESTING) - I reviewed patient records, labs, notes, testing and imaging myself where available.  Lab Results  Component Value Date   WBC 6.6 05/17/2022   HGB 11.8 (L) 05/17/2022   HCT 35.6 (L) 05/17/2022   MCV 87.5 05/17/2022   PLT 257 05/17/2022      Component Value Date/Time   NA 134 (L) 05/17/2022 2330   K 3.7 05/17/2022 2330   CL 102 05/17/2022 2330   CO2 22 05/17/2022 2330   GLUCOSE 101 (H) 05/17/2022 2330   BUN 12 05/17/2022 2330   CREATININE 1.35 (H) 05/17/2022 2330   CALCIUM 8.6 (L) 05/17/2022 2330   PROT  7.2 05/17/2022 2330   ALBUMIN 3.5 05/17/2022 2330   AST 22 05/17/2022 2330   ALT 15 05/17/2022 2330   ALKPHOS 44 05/17/2022 2330   BILITOT 0.5 05/17/2022 2330   GFRNONAA 59 (L) 05/17/2022 2330   GFRAA 48 (L) 08/16/2014 0833   No results found for: "CHOL", "HDL", "LDLCALC", "LDLDIRECT", "TRIG", "CHOLHDL" Lab Results  Component Value Date   HGBA1C 6.1 (H) 01/20/2013   Lab Results  Component Value Date   VITAMINB12 456 05/06/2022   Lab Results  Component Value Date   TSH 1.693 03/22/2012      ASSESSMENT AND PLAN  62 y.o. year old male here with:   Dx:  1. 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    Suanne Marker, MD 07/14/2022, 11:32 AM Certified in Neurology, Neurophysiology and Neuroimaging  [facility] [address] [place], [place] [address] [phone]

## 2022-07-14 NOTE — Telephone Encounter (Signed)
[employer] medicare/[place] medicaid NPR sent to GI [phone] 

## 2022-07-21 ENCOUNTER — Ambulatory Visit (HOSPITAL_COMMUNITY)
Admission: RE | Admit: 2022-07-21 | Discharge: 2022-07-21 | Disposition: A | Discharge status: Home / Self Care | Payer: 59 | Attending: Internal Medicine | Admitting: Internal Medicine

## 2022-07-21 ENCOUNTER — Ambulatory Visit (HOSPITAL_BASED_OUTPATIENT_CLINIC_OR_DEPARTMENT_OTHER): Payer: 59 | Admitting: Certified Registered"

## 2022-07-21 ENCOUNTER — Ambulatory Visit (HOSPITAL_COMMUNITY): Payer: 59 | Admitting: Certified Registered"

## 2022-07-21 ENCOUNTER — Other Ambulatory Visit: Payer: Self-pay

## 2022-07-21 ENCOUNTER — Encounter (HOSPITAL_COMMUNITY)
Admission: RE | Disposition: A | Discharge status: Home / Self Care | Payer: Self-pay | Source: Home / Self Care | Attending: Internal Medicine

## 2022-07-21 ENCOUNTER — Encounter (HOSPITAL_COMMUNITY): Payer: Self-pay | Admitting: Internal Medicine

## 2022-07-21 DIAGNOSIS — K5909 Other constipation: Secondary | ICD-10-CM | POA: Diagnosis not present

## 2022-07-21 DIAGNOSIS — H9190 Unspecified hearing loss, unspecified ear: Secondary | ICD-10-CM | POA: Insufficient documentation

## 2022-07-21 DIAGNOSIS — E119 Type 2 diabetes mellitus without complications: Secondary | ICD-10-CM

## 2022-07-21 DIAGNOSIS — Q438 Other specified congenital malformations of intestine: Secondary | ICD-10-CM | POA: Insufficient documentation

## 2022-07-21 DIAGNOSIS — F418 Other specified anxiety disorders: Secondary | ICD-10-CM | POA: Diagnosis not present

## 2022-07-21 DIAGNOSIS — Z1211 Encounter for screening for malignant neoplasm of colon: Secondary | ICD-10-CM | POA: Diagnosis not present

## 2022-07-21 DIAGNOSIS — D122 Benign neoplasm of ascending colon: Secondary | ICD-10-CM | POA: Insufficient documentation

## 2022-07-21 DIAGNOSIS — I10 Essential (primary) hypertension: Secondary | ICD-10-CM

## 2022-07-21 DIAGNOSIS — D126 Benign neoplasm of colon, unspecified: Secondary | ICD-10-CM | POA: Diagnosis not present

## 2022-07-21 HISTORY — PX: COLONOSCOPY WITH PROPOFOL: SHX5780

## 2022-07-21 HISTORY — PX: POLYPECTOMY: SHX5525

## 2022-07-21 LAB — GLUCOSE, CAPILLARY: Glucose-Capillary: 102 mg/dL — ABNORMAL HIGH (ref 70–99)

## 2022-07-21 SURGERY — COLONOSCOPY WITH PROPOFOL
Anesthesia: General

## 2022-07-21 MED ORDER — PROPOFOL 500 MG/50ML IV EMUL
INTRAVENOUS | Status: DC | PRN
  Administered 2022-07-21: 150 ug/kg/min via INTRAVENOUS

## 2022-07-21 MED ORDER — LIDOCAINE HCL (PF) 2 % IJ SOLN
INTRAMUSCULAR | Status: AC
  Filled 2022-07-21: qty 5

## 2022-07-21 MED ORDER — DEXMEDETOMIDINE HCL IN NACL 80 MCG/20ML IV SOLN
INTRAVENOUS | Status: AC
  Filled 2022-07-21: qty 20

## 2022-07-21 MED ORDER — LACTATED RINGERS IV SOLN: INTRAVENOUS | Status: DC

## 2022-07-21 MED ORDER — PROPOFOL 10 MG/ML IV BOLUS
INTRAVENOUS | Status: DC | PRN
  Administered 2022-07-21: 100 mg via INTRAVENOUS

## 2022-07-21 MED ORDER — PROPOFOL 1000 MG/100ML IV EMUL
INTRAVENOUS | Status: AC
  Filled 2022-07-21: qty 100

## 2022-07-21 MED ORDER — PHENYLEPHRINE 80 MCG/ML (10ML) SYRINGE FOR IV PUSH (FOR BLOOD PRESSURE SUPPORT)
PREFILLED_SYRINGE | INTRAVENOUS | Status: DC | PRN
  Administered 2022-07-21: 160 ug via INTRAVENOUS
  Administered 2022-07-21: 80 ug via INTRAVENOUS

## 2022-07-21 MED ORDER — LIDOCAINE HCL (CARDIAC) PF 100 MG/5ML IV SOSY
PREFILLED_SYRINGE | INTRAVENOUS | Status: DC | PRN
  Administered 2022-07-21: 80 mg via INTRAVENOUS

## 2022-07-21 MED ORDER — PHENYLEPHRINE 80 MCG/ML (10ML) SYRINGE FOR IV PUSH (FOR BLOOD PRESSURE SUPPORT)
PREFILLED_SYRINGE | INTRAVENOUS | Status: AC
  Filled 2022-07-21: qty 10

## 2022-07-21 NOTE — Transfer of Care (Addendum)
Immediate Anesthesia Transfer of Care Note  Patient: Shawn Baird  Procedure(s) Performed: COLONOSCOPY WITH PROPOFOL POLYPECTOMY  Patient Location: PACU  Anesthesia Type:General  Level of Consciousness: drowsy and patient cooperative  Airway & Oxygen Therapy: Patient Spontanous Breathing  Post-op Assessment: Report given to RN and Post -op Vital signs reviewed and stable  Post vital signs: Reviewed and stable  Last Vitals:  Vitals Value Taken Time  BP 124/79 07/21/22   0933  Temp 36.5 07/21/22   0933  Pulse 67 07/21/22   0933  Resp 18 07/21/22   0933  SpO2 97% 07/21/22   0933    Last Pain:  Vitals:   07/21/22 0858  TempSrc:   PainSc: 0-No pain      Patients Stated Pain Goal: 7 (07/21/22 0858)  Complications: No notable events documented.

## 2022-07-21 NOTE — Discharge Instructions (Signed)
  Colonoscopy Discharge Instructions  Read the instructions outlined below and refer to this sheet in the next few weeks. These discharge instructions provide you with general information on caring for yourself after you leave the hospital. Your doctor may also give you specific instructions. While your treatment has been planned according to the most current medical practices available, unavoidable complications occasionally occur. If you have any problems or questions after discharge, call Dr. Jena Gauss at [phone]. ACTIVITY You may resume your regular activity, but move at a slower pace for the next 24 hours.  Take frequent rest periods for the next 24 hours.  Walking will help get rid of the air and reduce the bloated feeling in your belly (abdomen).  No driving for 24 hours (because of the medicine (anesthesia) used during the test).   Do not sign any important legal documents or operate any machinery for 24 hours (because of the anesthesia used during the test).  NUTRITION Drink plenty of fluids.  You may resume your normal diet as instructed by your doctor.  Begin with a light meal and progress to your normal diet. Heavy or fried foods are harder to digest and may make you feel sick to your stomach (nauseated).  Avoid alcoholic beverages for 24 hours or as instructed.  MEDICATIONS You may resume your normal medications unless your doctor tells you otherwise.  WHAT YOU CAN EXPECT TODAY Some feelings of bloating in the abdomen.  Passage of more gas than usual.  Spotting of blood in your stool or on the toilet paper.  IF YOU HAD POLYPS REMOVED DURING THE COLONOSCOPY: No aspirin products for 7 days or as instructed.  No alcohol for 7 days or as instructed.  Eat a soft diet for the next 24 hours.  FINDING OUT THE RESULTS OF YOUR TEST Not all test results are available during your visit. If your test results are not back during the visit, make an appointment with your caregiver to find out the  results. Do not assume everything is normal if you have not heard from your caregiver or the medical facility. It is important for you to follow up on all of your test results.  SEEK IMMEDIATE MEDICAL ATTENTION IF: You have more than a spotting of blood in your stool.  Your belly is swollen (abdominal distention).  You are nauseated or vomiting.  You have a temperature over 101.  You have abdominal pain or discomfort that is severe or gets worse throughout the day.       2 polyps removed from your colon today  Further recommendations to follow pending review of pathology report   at patient request, I called Clarene Duke at [phone] -  reviewed findings and recommendations

## 2022-07-21 NOTE — Op Note (Addendum)
[facility] Patient Name: Shawn Baird Procedure Date: 07/21/2022 8:19 AM MRN: [id number] Date of Birth: [birth year] Attending MD: Gennette Pac , MD, [id number] CSN: [id number] Age: 62 Admit Type: Outpatient Procedure:                Colonoscopy Indications:              Screening for colorectal malignant neoplasm Providers:                Gennette Pac, MD, Nena Polio, RN, Dyann Ruddle Referring MD:              Medicines:                Propofol per Anesthesia Complications:            No immediate complications. Estimated Blood Loss:     Estimated blood loss was minimal. Procedure:                Pre-Anesthesia Assessment:                           - Prior to the procedure, a History and Physical                            was performed, and patient medications and                            allergies were reviewed. The patient's tolerance of                            previous anesthesia was also reviewed. The risks                            and benefits of the procedure and the sedation                            options and risks were discussed with the patient.                            All questions were answered, and informed consent                            was obtained. Prior Anticoagulants: The patient has                            taken no anticoagulant or antiplatelet agents. ASA                            Grade Assessment: III - A patient with severe                            systemic disease. After reviewing the risks and  benefits, the patient was deemed in satisfactory                            condition to undergo the procedure.                           After obtaining informed consent, the colonoscope                            was passed under direct vision. Throughout the                            procedure, the patient's blood pressure, pulse, and                             oxygen saturations were monitored continuously. The                            [id number] scope was introduced through the                            anus and advanced to the the cecum, identified by                            appendiceal orifice and ileocecal valve. The                            colonoscopy was performed without difficulty. The                            patient tolerated the procedure well. The quality                            of the bowel preparation was adequate. The                            ileocecal valve, appendiceal orifice, and rectum                            were photographed. The entire colon was well                            visualized. Scope In: 9:00:59 AM Scope Out: 9:24:49 AM Scope Withdrawal Time: 0 hours 10 minutes 1 second  Total Procedure Duration: 0 hours 23 minutes 50 seconds  Findings:      The perianal and digital rectal examinations were normal. Elongated and       redundant colon. This required external abdominal pressure and changing       of the patient's position to reach the cecum.      Two semi-pedunculated polyps were found in the mid ascending colon. The       polyps were 4 to 6 mm in size. These polyps were removed with a cold       snare. Resection and retrieval were complete. Estimated blood loss was  minimal.      The exam was otherwise without abnormality on direct and retroflexion       views. Impression:               - Two 4 to 6 mm polyps in the mid ascending colon,                            removed with a cold snare. Resected and retrieved.                            Elongated and redundant colon.                           - The examination was otherwise normal on direct                            and retroflexion views. Moderate Sedation:      Moderate (conscious) sedation was personally administered by an       anesthesia professional. The following parameters were monitored: oxygen       saturation,  heart rate, blood pressure, respiratory rate, EKG, adequacy       of pulmonary ventilation, and response to care. Recommendation:           - Patient has a contact number available for                            emergencies. The signs and symptoms of potential                            delayed complications were discussed with the                            patient. Return to normal activities tomorrow.                            Written discharge instructions were provided to the                            patient.                           - Advance diet as tolerated.                           - Continue present medications.                           - Repeat colonoscopy date to be determined after                            pending pathology results are reviewed for                            surveillance.                           -  Return to GI office (date not yet determined). Procedure Code(s):        --- Professional ---                           [phone], Colonoscopy, flexible; with removal of                            tumor(s), polyp(s), or other lesion(s) by snare                            technique Diagnosis Code(s):        --- Professional ---                           Z12.11, Encounter for screening for malignant                            neoplasm of colon                           D12.2, Benign neoplasm of ascending colon CPT copyright 2022 American Medical Association. All rights reserved. The codes documented in this report are preliminary and upon coder review may  be revised to meet current compliance requirements. Gerrit Friends. , MD Gennette Pac, MD 07/21/2022 9:31:54 AM This report has been signed electronically. Number of Addenda: 0

## 2022-07-21 NOTE — H&P (Signed)
@LOGO @   Primary Care Physician:  Christene Lye, FNP Primary Gastroenterologist:  Dr. Jena Gauss  Pre-Procedure History & Physical: HPI:  Shawn Baird is a 62 y.o. male is here for a screening colonoscopy. Patient   Mentally challenged  Unsure prior colonoscopy.  He is a resident of a group chronic constipation managed with [facility].   Patient is hearing impaired.  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 Authorizing Provider  amLODipine (NORVASC) 5 MG tablet Take 1 tablet (5 mg total) by mouth every morning. 10/14/13  Yes Charm Rings, NP  aspirin EC 81 MG tablet Take 1 tablet (81 mg total) by mouth every morning. 10/14/13  Yes Charm Rings, NP  atorvastatin (LIPITOR) 40 MG tablet Take 40 mg by mouth daily. 7a   Yes [provider]  divalproex (DEPAKOTE ER) 500 MG 24 hr tablet Take 1 tablet (500 mg total) by mouth 2 (two) times daily. Patient taking differently: Take 1,000 mg by mouth at bedtime. 10/14/13  Yes Charm Rings, NP  ezetimibe (ZETIA) 10 MG tablet Take 10 mg by mouth daily. 05/12/22  Yes [provider]  fenofibrate 54 MG tablet Take 1 tablet (54 mg total) by mouth daily. 10/14/13  Yes Charm Rings, NP  haloperidol (HALDOL) 5 MG tablet Take 5 mg by mouth 2 (two) times daily.   Yes [provider]  levothyroxine (SYNTHROID) 25 MCG tablet Take 25 mcg by mouth daily before breakfast. 05/12/22  Yes [provider]  linaclotide (LINZESS) 290  MCG CAPS capsule Take 1 capsule (290 mcg total) by mouth daily before breakfast. 05/06/22  Yes Ermalinda Memos S, PA-C  loratadine (CLARITIN) 10 MG tablet Take 10 mg by mouth daily.   Yes [provider]  metFORMIN (GLUCOPHAGE) 500 MG tablet Take 500 mg by mouth 2 (two) times daily with a meal.   Yes [provider]  Methylcellulose, Laxative, (FIBER THERAPY PO) Take 1 Scoop by mouth 2 (two) times a week. 1 tablespoon in a full glass of water by mouth 2 times weekly on Tuesday and Thursday. *Brand Fiber Therapy (orange powder)   Yes [provider]  metoprolol succinate (TOPROL-XL) 25 MG 24 hr tablet Take 12.5 mg by mouth daily. 05/12/22  Yes [provider]  Omega-3 Fatty Acids (FISH OIL) 1000 MG CAPS Take 1,000 mg by mouth daily.   Yes [provider]  omeprazole (PRILOSEC) 20 MG capsule Take 1 capsule (20 mg total) by mouth every morning. 10/14/13  Yes Charm Rings, NP  QUEtiapine (SEROQUEL XR) 200 MG 24 hr tablet Take 200 mg by mouth at bedtime. Take with 600 mg for a total of 800 mg at bedtime 07/07/22  Yes [provider]  QUEtiapine (SEROQUEL XR) 300 MG 24 hr tablet Take 600 mg by mouth at bedtime. Take with  200 mg for a total of 800 mg at bedtime 07/08/22  Yes [provider]  traZODone (DESYREL) 100 MG tablet Take 100 mg by mouth at bedtime.   Yes [provider]  Vitamin D, Ergocalciferol, (DRISDOL) 50000 UNITS CAPS capsule Take 1 capsule (50,000 Units total) by mouth 2 (two) times a week. He takes on Tuesdays and Thursdays. Patient taking differently: Take 50,000 Units by mouth every 7 (seven) days. Sundays 10/14/13  Yes Charm Rings, NP  zinc gluconate 50 MG tablet Take 50 mg by mouth daily.   Yes [provider]  polyethylene glycol powder (GLYCOLAX/MIRALAX) 17 GM/SCOOP powder Take 17 g by mouth 2 (two) times daily. 05/20/22   Letta Median, PA-C    Allergies as of 05/29/2022   (No Known Allergies)     Family History  Problem Relation Age of Onset   High blood pressure Sister    High Cholesterol Sister    Heart disease Mother    Stroke Mother     Social History   Socioeconomic History   Marital status: Single    Spouse name: Not on file   Number of children: Not on file   Years of education: Not on file   Highest education level: Not on file  Occupational History   Not on file  Tobacco Use   Smoking status: Never   Smokeless tobacco: Never  Substance and Sexual Activity   Alcohol use: No   Drug use: No   Sexual activity: Never  Other Topics Concern   Not on file  Social History Narrative   [facility]   Social Determinants of Health   Financial Resource Strain: Not on file  Food Insecurity: Not on file  Transportation Needs: Not on file  Physical Activity: Not on file  Stress: Not on file  Social Connections: Not on file  Intimate Partner Violence: Not on file    Review of Systems: See HPI, otherwise negative ROS  Physical Exam: BP 119/82   Pulse 88   Temp 98.1 F (36.7 C) (Oral)   Resp (!) 21   SpO2 96%  General:   Alert,  Well-developed, well-nourished, pleasant and cooperative in NAD al. Neck:  Supple; no masses or thyromegaly. Lungs:  Clear throughout to auscultation.   No wheezes, crackles, or rhonchi. No acute distress. Heart:  Regular rate and rhythm; no murmurs, clicks, rubs,  or gallops. Abdomen:  Soft, nontender and nondistended. No masses, hepatosplenomegaly or hernias noted. Normal bowel sounds, without guarding, and without rebound.    Impression/Plan: Shawn Baird is now here to undergo a screening colonoscopy.   Possibly, first ever screening examination.  Risks, benefits, limitations, imponderables and alternatives regarding colonoscopy have been reviewed with the patient. Questions have been answered. All parties agreeable.     Notice:  This dictation was prepared with Dragon dictation along with smaller phrase  technology. Any transcriptional errors that result from this process are unintentional and may not be corrected upon review.

## 2022-07-21 NOTE — Anesthesia Preprocedure Evaluation (Signed)
Anesthesia Evaluation  Patient identified by MRN, date of birth, ID band Patient awake    Reviewed: Allergy & Precautions, H&P , NPO status , Patient's Chart, lab work & pertinent test results, reviewed documented beta blocker date and time   Airway Mallampati: II  TM Distance: >3 FB Neck ROM: full    Dental no notable dental hx.    Pulmonary neg pulmonary ROS   Pulmonary exam normal breath sounds clear to auscultation       Cardiovascular Exercise Tolerance: Good hypertension, negative cardio ROS  Rhythm:regular Rate:Normal     Neuro/Psych  PSYCHIATRIC DISORDERS Anxiety Depression  Schizophrenia  negative neurological ROS  negative psych ROS   GI/Hepatic negative GI ROS, Neg liver ROS,GERD  ,,  Endo/Other  negative endocrine ROSdiabetesHypothyroidism    Renal/GU Renal diseasenegative Renal ROS  negative genitourinary   Musculoskeletal   Abdominal   Peds  Hematology negative hematology ROS (+)   Anesthesia Other Findings   Reproductive/Obstetrics negative OB ROS                             Anesthesia Physical Anesthesia Plan  ASA: 3  Anesthesia Plan: General   Post-op Pain Management:    Induction:   PONV Risk Score and Plan: Propofol infusion  Airway Management Planned:   Additional Equipment:   Intra-op Plan:   Post-operative Plan:   Informed Consent: I have reviewed the patients History and Physical, chart, labs and discussed the procedure including the risks, benefits and alternatives for the proposed anesthesia with the patient or authorized representative who has indicated his/her understanding and acceptance.     Dental Advisory Given  Plan Discussed with: CRNA  Anesthesia Plan Comments:        Anesthesia Quick Evaluation

## 2022-07-22 LAB — SURGICAL PATHOLOGY

## 2022-07-23 NOTE — Anesthesia Postprocedure Evaluation (Signed)
Anesthesia Post Note  Patient: Shawn Baird  Procedure(s) Performed: COLONOSCOPY WITH PROPOFOL POLYPECTOMY  Patient location during evaluation: Phase II Anesthesia Type: General Level of consciousness: awake Pain management: pain level controlled Vital Signs Assessment: post-procedure vital signs reviewed and stable Respiratory status: spontaneous breathing and respiratory function stable Cardiovascular status: blood pressure returned to baseline and stable Postop Assessment: no headache and no apparent nausea or vomiting Anesthetic complications: no Comments: Late entry   No notable events documented.   Last Vitals:  Vitals:   07/21/22 0830 07/21/22 0933  BP: 119/82 124/79  Pulse: 88 67  Resp: (!) 21 18  Temp: 36.7 C 36.5 C  SpO2: 96% 97%    Last Pain:  Vitals:   07/21/22 0933  TempSrc: Axillary  PainSc: 0-No pain                 Windell Norfolk

## 2022-07-24 ENCOUNTER — Encounter: Payer: Self-pay | Admitting: Internal Medicine

## 2022-07-24 ENCOUNTER — Encounter (HOSPITAL_COMMUNITY): Payer: Self-pay | Admitting: Internal Medicine

## 2022-08-26 ENCOUNTER — Ambulatory Visit
Admission: RE | Admit: 2022-08-26 | Discharge: 2022-08-26 | Disposition: A | Discharge status: Home / Self Care | Payer: 59 | Source: Ambulatory Visit | Attending: Diagnostic Neuroimaging | Admitting: Diagnostic Neuroimaging

## 2022-08-26 DIAGNOSIS — R269 Unspecified abnormalities of gait and mobility: Secondary | ICD-10-CM

## 2022-08-27 ENCOUNTER — Inpatient Hospital Stay (HOSPITAL_COMMUNITY)
Admission: EM | Admit: 2022-08-27 | Discharge: 2022-08-29 | DRG: 683 | Disposition: A | Discharge status: Nursing Home (Includes ICF) | Payer: 59 | Source: Skilled Nursing Facility | Attending: Family Medicine | Admitting: Family Medicine

## 2022-08-27 ENCOUNTER — Other Ambulatory Visit: Payer: Self-pay

## 2022-08-27 ENCOUNTER — Encounter (HOSPITAL_COMMUNITY): Payer: Self-pay

## 2022-08-27 ENCOUNTER — Emergency Department (HOSPITAL_COMMUNITY): Payer: 59

## 2022-08-27 DIAGNOSIS — F952 Tourette's disorder: Secondary | ICD-10-CM | POA: Diagnosis present

## 2022-08-27 DIAGNOSIS — M6282 Rhabdomyolysis: Secondary | ICD-10-CM | POA: Diagnosis present

## 2022-08-27 DIAGNOSIS — Z823 Family history of stroke: Secondary | ICD-10-CM

## 2022-08-27 DIAGNOSIS — Z8249 Family history of ischemic heart disease and other diseases of the circulatory system: Secondary | ICD-10-CM

## 2022-08-27 DIAGNOSIS — I129 Hypertensive chronic kidney disease with stage 1 through stage 4 chronic kidney disease, or unspecified chronic kidney disease: Secondary | ICD-10-CM | POA: Diagnosis present

## 2022-08-27 DIAGNOSIS — Z79899 Other long term (current) drug therapy: Secondary | ICD-10-CM

## 2022-08-27 DIAGNOSIS — E119 Type 2 diabetes mellitus without complications: Secondary | ICD-10-CM

## 2022-08-27 DIAGNOSIS — Z83438 Family history of other disorder of lipoprotein metabolism and other lipidemia: Secondary | ICD-10-CM

## 2022-08-27 DIAGNOSIS — F259 Schizoaffective disorder, unspecified: Secondary | ICD-10-CM | POA: Diagnosis present

## 2022-08-27 DIAGNOSIS — Z7984 Long term (current) use of oral hypoglycemic drugs: Secondary | ICD-10-CM

## 2022-08-27 DIAGNOSIS — E874 Mixed disorder of acid-base balance: Secondary | ICD-10-CM | POA: Diagnosis present

## 2022-08-27 DIAGNOSIS — Z7982 Long term (current) use of aspirin: Secondary | ICD-10-CM

## 2022-08-27 DIAGNOSIS — E860 Dehydration: Secondary | ICD-10-CM | POA: Diagnosis present

## 2022-08-27 DIAGNOSIS — E876 Hypokalemia: Secondary | ICD-10-CM | POA: Diagnosis present

## 2022-08-27 DIAGNOSIS — F79 Unspecified intellectual disabilities: Secondary | ICD-10-CM | POA: Diagnosis present

## 2022-08-27 DIAGNOSIS — D72828 Other elevated white blood cell count: Secondary | ICD-10-CM | POA: Diagnosis present

## 2022-08-27 DIAGNOSIS — I959 Hypotension, unspecified: Principal | ICD-10-CM

## 2022-08-27 DIAGNOSIS — E785 Hyperlipidemia, unspecified: Secondary | ICD-10-CM | POA: Diagnosis present

## 2022-08-27 DIAGNOSIS — N179 Acute kidney failure, unspecified: Principal | ICD-10-CM | POA: Diagnosis present

## 2022-08-27 DIAGNOSIS — E1122 Type 2 diabetes mellitus with diabetic chronic kidney disease: Secondary | ICD-10-CM | POA: Diagnosis present

## 2022-08-27 DIAGNOSIS — F251 Schizoaffective disorder, depressive type: Secondary | ICD-10-CM | POA: Diagnosis present

## 2022-08-27 DIAGNOSIS — I10 Essential (primary) hypertension: Secondary | ICD-10-CM

## 2022-08-27 DIAGNOSIS — N1831 Chronic kidney disease, stage 3a: Secondary | ICD-10-CM | POA: Diagnosis present

## 2022-08-27 DIAGNOSIS — E039 Hypothyroidism, unspecified: Secondary | ICD-10-CM | POA: Diagnosis present

## 2022-08-27 DIAGNOSIS — K219 Gastro-esophageal reflux disease without esophagitis: Secondary | ICD-10-CM | POA: Diagnosis present

## 2022-08-27 DIAGNOSIS — Z79890 Hormone replacement therapy (postmenopausal): Secondary | ICD-10-CM

## 2022-08-27 LAB — URINALYSIS, W/ REFLEX TO CULTURE (INFECTION SUSPECTED)
Bilirubin Urine: NEGATIVE
Glucose, UA: NEGATIVE mg/dL
Ketones, ur: NEGATIVE mg/dL
Leukocytes,Ua: NEGATIVE
Nitrite: NEGATIVE
Protein, ur: 30 mg/dL — AB
Specific Gravity, Urine: 1.006 (ref 1.005–1.030)
pH: 7 (ref 5.0–8.0)

## 2022-08-27 LAB — CBC WITH DIFFERENTIAL/PLATELET
Abs Immature Granulocytes: 0.11 10*3/uL — ABNORMAL HIGH (ref 0.00–0.07)
Basophils Absolute: 0.1 10*3/uL (ref 0.0–0.1)
Basophils Relative: 1 %
Eosinophils Absolute: 0.8 10*3/uL — ABNORMAL HIGH (ref 0.0–0.5)
Eosinophils Relative: 8 %
HCT: 36.5 % — ABNORMAL LOW (ref 39.0–52.0)
Hemoglobin: 11.9 g/dL — ABNORMAL LOW (ref 13.0–17.0)
Immature Granulocytes: 1 %
Lymphocytes Relative: 18 %
Lymphs Abs: 2 10*3/uL (ref 0.7–4.0)
MCH: 28.8 pg (ref 26.0–34.0)
MCHC: 32.6 g/dL (ref 30.0–36.0)
MCV: 88.4 fL (ref 80.0–100.0)
Monocytes Absolute: 0.8 10*3/uL (ref 0.1–1.0)
Monocytes Relative: 8 %
Neutro Abs: 6.8 10*3/uL (ref 1.7–7.7)
Neutrophils Relative %: 64 %
Platelets: 281 10*3/uL (ref 150–400)
RBC: 4.13 MIL/uL — ABNORMAL LOW (ref 4.22–5.81)
RDW: 13.4 % (ref 11.5–15.5)
WBC: 10.6 10*3/uL — ABNORMAL HIGH (ref 4.0–10.5)
nRBC: 0 % (ref 0.0–0.2)

## 2022-08-27 LAB — TSH: TSH: 5.154 u[IU]/mL — ABNORMAL HIGH (ref 0.350–4.500)

## 2022-08-27 LAB — COMPREHENSIVE METABOLIC PANEL
ALT: 15 U/L (ref 0–44)
AST: 26 U/L (ref 15–41)
Albumin: 3.5 g/dL (ref 3.5–5.0)
Alkaline Phosphatase: 50 U/L (ref 38–126)
Anion gap: 19 — ABNORMAL HIGH (ref 5–15)
BUN: 16 mg/dL (ref 8–23)
CO2: 15 mmol/L — ABNORMAL LOW (ref 22–32)
Calcium: 8.8 mg/dL — ABNORMAL LOW (ref 8.9–10.3)
Chloride: 102 mmol/L (ref 98–111)
Creatinine, Ser: 2.17 mg/dL — ABNORMAL HIGH (ref 0.61–1.24)
GFR, Estimated: 33 mL/min — ABNORMAL LOW (ref >60)
Glucose, Bld: 148 mg/dL — ABNORMAL HIGH (ref 70–99)
Potassium: 3.4 mmol/L — ABNORMAL LOW (ref 3.5–5.1)
Sodium: 136 mmol/L (ref 135–145)
Total Bilirubin: 0.4 mg/dL (ref 0.3–1.2)
Total Protein: 7 g/dL (ref 6.5–8.1)

## 2022-08-27 LAB — BLOOD GAS, VENOUS
Acid-base deficit: 14 mmol/L — ABNORMAL HIGH (ref 0.0–2.0)
Bicarbonate: 11 mmol/L — ABNORMAL LOW (ref 20.0–28.0)
Drawn by: 23968
O2 Saturation: 79.7 %
Patient temperature: 36.9
pCO2, Ven: 24 mmHg — ABNORMAL LOW (ref 44–60)
pH, Ven: 7.27 (ref 7.25–7.43)
pO2, Ven: 48 mmHg — ABNORMAL HIGH (ref 32–45)

## 2022-08-27 LAB — GLUCOSE, CAPILLARY
Glucose-Capillary: 114 mg/dL — ABNORMAL HIGH (ref 70–99)
Glucose-Capillary: 164 mg/dL — ABNORMAL HIGH (ref 70–99)

## 2022-08-27 LAB — PROTIME-INR
INR: 1.1 (ref 0.8–1.2)
Prothrombin Time: 14.9 seconds (ref 11.4–15.2)

## 2022-08-27 LAB — CULTURE, BLOOD (ROUTINE X 2): Special Requests: ADEQUATE

## 2022-08-27 LAB — LACTIC ACID, PLASMA
Lactic Acid, Venous: >9 mmol/L (ref 0.5–1.9)
Lactic Acid, Venous: 3.8 mmol/L (ref 0.5–1.9)

## 2022-08-27 LAB — CBG MONITORING, ED: Glucose-Capillary: 170 mg/dL — ABNORMAL HIGH (ref 70–99)

## 2022-08-27 LAB — APTT: aPTT: 21 seconds — ABNORMAL LOW (ref 24–36)

## 2022-08-27 MED ORDER — LACTATED RINGERS IV BOLUS
1000.0000 mL | Freq: Once | INTRAVENOUS | Status: AC
  Administered 2022-08-27: 1000 mL via INTRAVENOUS

## 2022-08-27 MED ORDER — LACTATED RINGERS IV SOLN: INTRAVENOUS | Status: DC

## 2022-08-27 MED ORDER — INSULIN ASPART 100 UNIT/ML IJ SOLN
0.0000 [IU] | Freq: Three times a day (TID) | INTRAMUSCULAR | Status: DC
  Administered 2022-08-28: 3 [IU] via SUBCUTANEOUS
  Administered 2022-08-28: 2 [IU] via SUBCUTANEOUS

## 2022-08-27 MED ORDER — LEVOTHYROXINE SODIUM 25 MCG PO TABS
25.0000 ug | ORAL_TABLET | Freq: Every day | ORAL | Status: DC
  Administered 2022-08-28 – 2022-08-29 (×2): 25 ug via ORAL
  Filled 2022-08-27 (×2): qty 1

## 2022-08-27 MED ORDER — HEPARIN SODIUM (PORCINE) 5000 UNIT/ML IJ SOLN
5000.0000 [IU] | Freq: Three times a day (TID) | INTRAMUSCULAR | Status: DC
  Administered 2022-08-28 – 2022-08-29 (×3): 5000 [IU] via SUBCUTANEOUS
  Filled 2022-08-27 (×3): qty 1

## 2022-08-27 MED ORDER — HYDRALAZINE HCL 20 MG/ML IJ SOLN: 5.0000 mg | INTRAMUSCULAR | Status: DC | PRN

## 2022-08-27 MED ORDER — SODIUM BICARBONATE 650 MG PO TABS
1300.0000 mg | ORAL_TABLET | Freq: Three times a day (TID) | ORAL | Status: AC
  Administered 2022-08-27 – 2022-08-28 (×3): 1300 mg via ORAL
  Filled 2022-08-27 (×3): qty 2

## 2022-08-27 NOTE — ED Provider Notes (Signed)
[place] EMERGENCY DEPARTMENT AT [facility] Provider Note  CSN: [id number] Arrival date & time: 08/27/22 1029  Chief Complaint(s) Hypotension  HPI Shawn Baird is a 62 y.o. male with PMH intellectual disability, schizoaffective disorder, diabetes, Tourette's with head tics, nonverbal who presents emergency department for evaluation of hypotension.  Patient was found on his knees near his closet in the nursing facility this morning and was acting abnormally.  He was found to be hypotensive by EMS with initial systolics in the 60s.  He arrives alert alert and hypotensive and is unable to provide any additional history as he is nonverbal.   Past Medical History Past Medical History:  Diagnosis Date   Anxiety    Cancer (HCC)    Chronic constipation    Diabetes mellitus    HOH (hard of hearing)    Hyperlipemia    Hypertension    Hypothyroidism    Impulsive control disorder   Mental disorder    Mental retardation    Otitis media    Pancreatitis    Schizoaffective disorder (HCC)    Tourette's disease    Patient Active Problem List   Diagnosis Date Noted   Schizoaffective disorder, depressive type (HCC)    Mental retardation 08/26/2012   Aggressive behavior of adult 06/28/2012   Gastroparesis 03/22/2012   Transaminitis 03/11/2012   Abdominal  pain, other specified site with nausea and vomiting, likely due to viral gastroenteritis versus diabetic gastroparesis 01/15/2012   Diabetes (HCC) 01/15/2012   Hypertension 01/15/2012   Hyperlipidemia 01/15/2012   Schizoaffective disorder (HCC) 01/15/2012   Chronic constipation 01/15/2012   GERD (gastroesophageal reflux disease) 01/15/2012   Left Renal calcification 01/15/2012   Home Medication(s) Prior to Admission medications   Medication Sig Start Date End Date Taking? Authorizing Provider  amLODipine (NORVASC) 5 MG tablet Take 1 tablet (5 mg total) by mouth every morning. 10/14/13   Charm Rings, NP  aspirin EC 81  MG tablet Take 1 tablet (81 mg total) by mouth every morning. 10/14/13   Charm Rings, NP  atorvastatin (LIPITOR) 40 MG tablet Take 40 mg by mouth daily. 7a    [provider]  divalproex (DEPAKOTE ER) 500 MG 24 hr tablet Take 1 tablet (500 mg total) by mouth 2 (two) times daily. Patient taking differently: Take 1,000 mg by mouth at bedtime. 10/14/13   Charm Rings, NP  ezetimibe (ZETIA) 10 MG tablet Take 10 mg by mouth daily. 05/12/22   [provider]  fenofibrate 54 MG tablet Take 1 tablet (54 mg total) by mouth daily. 10/14/13   Charm Rings, NP  haloperidol (HALDOL) 5 MG tablet Take 5 mg by mouth 2 (two) times daily.    [provider]  levothyroxine (SYNTHROID) 25 MCG tablet Take 25 mcg by mouth daily before breakfast. 05/12/22   [provider]  linaclotide Karlene Einstein) 290 MCG CAPS capsule Take 1 capsule (290 mcg total) by mouth daily before breakfast. 05/06/22   Letta Median, PA-C  loratadine (CLARITIN) 10 MG tablet Take 10 mg by mouth daily.    [provider]  metFORMIN (GLUCOPHAGE) 500 MG tablet Take 500 mg by mouth 2 (two) times daily with a meal.    [provider]  Methylcellulose, Laxative, (FIBER THERAPY PO) Take 1 Scoop by mouth 2 (two) times a week. 1 tablespoon in a full glass of water by mouth 2 times weekly on Tuesday and Thursday. *Brand Fiber Therapy (orange powder)    [provider]  metoprolol succinate (TOPROL-XL) 25 MG 24 hr tablet Take 12.5 mg by mouth daily. 05/12/22   [provider]  Omega-3 Fatty Acids (FISH OIL) 1000 MG CAPS Take 1,000 mg by mouth daily.    [provider]  omeprazole (PRILOSEC) 20 MG capsule Take 1 capsule (20 mg total) by mouth every morning. 10/14/13   Charm Rings, NP  polyethylene glycol powder (GLYCOLAX/MIRALAX) 17 GM/SCOOP powder Take 17 g by mouth 2 (two) times daily. 05/20/22   Letta Median, PA-C  QUEtiapine (SEROQUEL XR) 200 MG 24 hr tablet Take 200  mg by mouth at bedtime. Take with 600 mg for a total of 800 mg at bedtime 07/07/22   [provider]  QUEtiapine (SEROQUEL XR) 300 MG 24 hr tablet Take 600 mg by mouth at bedtime. Take with 200 mg for a total of 800 mg at bedtime 07/08/22   [provider]  traZODone (DESYREL) 100 MG tablet Take 100 mg by mouth at bedtime.    [provider]  Vitamin D, Ergocalciferol, (DRISDOL) 50000 UNITS CAPS capsule Take 1 capsule (50,000 Units total) by mouth 2 (two) times a week. He takes on Tuesdays and Thursdays. Patient taking differently: Take 50,000 Units by mouth every 7 (seven) days. Sundays 10/14/13   Charm Rings, NP  zinc gluconate 50 MG tablet Take 50 mg by mouth daily.    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  Review of Systems Review of Systems  Unable to perform ROS: Patient nonverbal    Physical Exam Vital Signs  I have reviewed the triage vital signs Ht 5\' 1"  (1.549 m)   Wt 73.3 kg   BMI 30.53 kg/m   Physical  Exam Constitutional:      General: He is not in acute distress.  Appearance: Normal appearance.  HENT:     Head: Normocephalic and atraumatic.     Nose: No congestion or rhinorrhea.  Eyes:     General:        Right eye: No discharge.        Left eye: No discharge.     Extraocular Movements: Extraocular movements intact.     Pupils: Pupils are equal, round, and reactive to light.  Cardiovascular:     Rate and Rhythm: Normal rate and regular rhythm.     Heart sounds: No murmur heard. Pulmonary:     Effort: No respiratory distress.     Breath sounds: No wheezing or rales.  Abdominal:     General: There is no distension.     Tenderness: There is no abdominal tenderness.  Musculoskeletal:        General: Normal range of motion.     Cervical back: Normal range of motion.  Skin:    General: Skin is warm and dry.  Neurological:     General: No focal deficit present.     Mental Status: He is alert.     ED Results and Treatments Labs (all labs ordered are listed, but only abnormal results are displayed) Labs Reviewed  CBG MONITORING, ED - Abnormal; Notable for the following components:      Result Value   Glucose-Capillary 170 (*)    All other components within normal limits  CULTURE, BLOOD (ROUTINE X 2)  CULTURE, BLOOD (ROUTINE X 2)  LACTIC ACID, PLASMA  LACTIC ACID, PLASMA  COMPREHENSIVE METABOLIC PANEL  CBC WITH DIFFERENTIAL/PLATELET  PROTIME-INR  APTT  URINALYSIS, W/ REFLEX TO CULTURE (INFECTION SUSPECTED)                                                                                                                          Radiology MR BRAIN WO CONTRAST  Result Date: 08/26/2022  [facility] [address] [place], [place] [address] [phone] NEUROIMAGING REPORT STUDY DATE: 08/26/2022 PATIENT NAME: Shawn Baird DOB: 22-Aug-1960 MRN: [id number] ORDERING CLINICIAN: Dr. Marjory Lies CLINICAL HISTORY: 62 year old patient being evaluated for gait  abnormality COMPARISON FILMS: None available EXAM: MRI brain without contrast TECHNIQUE: Sagittal T1, axial T1, T2, FLAIR, DWI, ADC map, SWI, coronal T2 images were obtained through the brain.  Study slightly suboptimal due to motion artifacts CONTRAST: None IMAGING SITE: [facility] FINDINGS: Study is limited due to motion artifacts.  The brain parenchyma shows no abnormal signal intensities.  No structural lesion, tumor or infarct is noted.  Diffusion-weighted imaging is negative for acute ischemia.  SWI sequences do not show any microhemorrhages.  Subarachnoid spaces and ventricular system show slight dilatation.  There is mild degree of generalized cerebral atrophy which is likely age disproportionate.  Cortical sulci and gyri appear normal.  Extra-axial brain structures appear normal.  Calvarium shows no abnormalities.  Orbits appear unremarkable.  Paranasal sinuses show only minor chronic thickening.  The pituitary gland and cerebellar tonsils  appear normal.  Visualized portion of the cervical spine show only minor disc degenerative changes.  Flow-voids of the large vessels are intact and circulation appear to be patent though posterior circulation flow-voids appear to be of diminutive caliber.   Unremarkable MRI scan of the brain without contrast. INTERPRETING PHYSICIAN: PRAMOD SETHI, MD Certified in  Neuroimaging by American Society of Neuroimaging and [employer]   Pertinent labs & imaging results that were available during my care of the patient were reviewed by me and considered in my medical decision making (see MDM for details).  Medications Ordered in ED Medications - No data to display                                                                                                                                   Procedures .Critical Care  Performed by: Glendora Score, MD Authorized by: Glendora Score, MD   Critical care provider statement:     Critical care time (minutes):  30   Critical care was necessary to treat or prevent imminent or life-threatening deterioration of the following conditions:  Circulatory failure   Critical care was time spent personally by me on the following activities:  Development of treatment plan with patient or surrogate, discussions with consultants, evaluation of patient's response to treatment, examination of patient, ordering and review of laboratory studies, ordering and review of radiographic studies, ordering and performing treatments and interventions, pulse oximetry, re-evaluation of patient's condition and review of old charts   (including critical care time)  Medical Decision Making / ED Course   This patient presents to the ED for concern of hypotension, syncope, this involves an extensive number of treatment options, and is a complaint that carries with it a high risk of complications and morbidity.  The differential diagnosis includes seizure, syncope, hypovolemia, sepsis, polypharmacy  MDM: Patient seen in the emergency department for evaluation of altered mental status, syncope and hypotension.  Physical exam reveals a nonverbal patient with an intermittent head tic consistent with his previous known history of tics but no visible diaphoresis here in the emergency department.  Physical exam otherwise unremarkable.  Neurologic exam unremarkable.  Patient arrives hypotensive but was very responsive to first liter of fluid and vital signs normalized.  Laboratory evaluation with a leukocytosis to 10.6, hemoglobin 11.9, hypokalemia to 3.4, new creatinine elevation to 2.17, CO2 15 and initial lactic acid is greater than 9.  Patient has no abdominal pain and I have lower suspicion for ischemic bowel.  Seizure remains on the differential but CT head and chest x-ray are unremarkable.  After 2 L fluid resuscitation.  Lactic acid downtrending to 3.8.  pH 7.27 with a bicarb of 11.  Urinalysis without evidence  of infection, large hemoglobin but no red blood cells and CK ordered.  Patient will require hospital admission for lactic acidosis and AKI.  Maintenance fluids initiated and patient admitted.  Additional history obtained: -Additional history obtained from multiple family members -External records from outside source obtained and reviewed including: Chart review including previous notes, labs, imaging, consultation notes   Lab Tests: -I ordered, reviewed, and interpreted labs.   The pertinent results include:   Labs Reviewed  CBG MONITORING, ED - Abnormal; Notable for the following components:      Result Value   Glucose-Capillary 170 (*)    All other components within normal limits  CULTURE, BLOOD (ROUTINE X 2)  CULTURE, BLOOD (ROUTINE X 2)  LACTIC ACID, PLASMA  LACTIC ACID, PLASMA  COMPREHENSIVE METABOLIC PANEL  CBC WITH DIFFERENTIAL/PLATELET  PROTIME-INR  APTT  URINALYSIS, W/ REFLEX TO CULTURE (INFECTION SUSPECTED)      EKG   EKG Interpretation Date/Time:  Thursday August 27 2022 10:51:14 EDT Ventricular Rate:  104 PR Interval:  149 QRS Duration:  138 QT Interval:  378 QTC Calculation: 498 R Axis:   56  Text Interpretation: Sinus tachycardia Right bundle branch block Confirmed by ,  ([id number]) on 08/27/2022 10:07:23 PM         Imaging Studies ordered: I ordered imaging studies including CT head, chest x-ray I independently visualized and interpreted imaging. I agree with the radiologist interpretation   Medicines ordered and prescription drug management: No orders of the defined types were placed in this encounter.   -I have reviewed the patients home medicines and have made adjustments as needed  Critical interventions Fluid resuscitation    Cardiac Monitoring: The patient was maintained on a cardiac monitor.  I personally viewed and interpreted the cardiac monitored which showed an underlying rhythm of: NSR, sinus tachycardia  Social  Determinants of Health:  Factors impacting patients care include: Lives in a group home   Reevaluation: After the interventions noted above, I reevaluated the patient and found that they have :improved  Co morbidities that complicate the patient evaluation  Past Medical History:  Diagnosis Date   Anxiety    Cancer (HCC)    Chronic constipation    Diabetes mellitus    HOH (hard of hearing)    Hyperlipemia    Hypertension    Hypothyroidism    Impulsive control disorder   Mental disorder    Mental retardation    Otitis media    Pancreatitis    Schizoaffective disorder (HCC)    Tourette's disease       Dispostion: I considered admission for this patient, and due to lactic acidosis and new AKI patient require hospital admission     Final Clinical Impression(s) / ED Diagnoses Final diagnoses:  None     @PCDICTATION @    Glendora Score, MD 08/27/22 2208

## 2022-08-27 NOTE — ED Notes (Signed)
Lactic 3.8 Informed MD

## 2022-08-27 NOTE — H&P (Addendum)
TRH H&P   Patient Demographics:    Shawn Baird, is a 62 y.o. male  MRN: [id number]   DOB - [birth year]  Admit Date - 08/27/2022  Outpatient Primary MD for the patient is Christene Lye, FNP  Referring MD/NP/PA: TDr Kommer   Patient coming from: [facility]  Chief Complaint  Patient presents with   Hypotension      HPI:    Shawn Baird  is a 62 y.o. male, with history of T2DM, HTN, HLD, Schizoaffective d/o, tourette's, mental retardation, pancreatitis who is at [facility] assisted living facility patient with mental retardation at baseline, unreliable historian, history has been obtained from sisters at bedside and ED staff. -Presents to emergency department for evaluation of hypotension, patient was found on his knees near his closet in the assisted living facility this morning, he was found to be hypotensive, with systolic blood pressure in the 60s, otherwise no history can be obtained from the patient, and no other history provided by staff on the ALF, most recent he was seen 6 weeks ago by his sister at the facility where she reports that he was at baseline where he is ambulatory.  Family unaware of any recent changes of his medications at the facility. -Presents alert to ED emergency department, but he was noted to be hypotensive, his workup was significant for lactic acidosis 9, and bicarb of 15, potassium of 3.4, creatinine 2.17, up from baseline 1.5, no evidence of infection in the UA or chest x-ray, as well no acute finding and CT head, he responded nicely to fluid bolus, blood pressure has improved, and lactic acid significantly trending down, [facility] hospitalist consulted to admit.     Review of systems:    Patient with mental retardation, schizoaffective disorder, he cannot provide any reliable complaints  With Past History of the following :    Past Medical  History:  Diagnosis Date   Anxiety    Cancer (HCC)    Chronic constipation    Diabetes mellitus    HOH (hard of hearing)    Hyperlipemia    Hypertension    Hypothyroidism    Impulsive control disorder   Mental disorder    Mental retardation    Otitis media    Pancreatitis    Schizoaffective disorder (HCC)    Tourette's disease       Past Surgical History:  Procedure Laterality Date   COLONOSCOPY WITH PROPOFOL N/A 07/21/2022   Procedure: COLONOSCOPY WITH PROPOFOL;  Surgeon: Corbin Ade, MD;  Location: [facility];  Service: Endoscopy;  Laterality: N/A;  10:45am, asa 2   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 Authorizing Provider  amLODipine (NORVASC) 5 MG tablet Take 1 tablet (5 mg total) by mouth every morning. 10/14/13   Charm Rings, NP  aspirin EC 81 MG tablet Take 1 tablet (81 mg total) by mouth every morning. 10/14/13   Charm Rings, NP  atorvastatin (LIPITOR) 40 MG tablet Take 40 mg by mouth daily. 7a    [provider]  divalproex (DEPAKOTE ER) 500 MG 24 hr tablet Take 1 tablet (500 mg total) by mouth 2 (two) times daily. Patient taking differently: Take 1,000 mg by mouth at bedtime. 10/14/13   Charm Rings, NP  ezetimibe (ZETIA) 10 MG tablet Take 10 mg by mouth daily. 05/12/22   [provider]  fenofibrate 54 MG tablet Take 1 tablet (54 mg total) by mouth daily.  10/14/13   Charm Rings, NP  haloperidol (HALDOL) 5 MG tablet Take 5 mg by mouth 2 (two) times daily.    [provider]  levothyroxine (SYNTHROID) 25 MCG tablet Take 25 mcg by mouth daily before breakfast. 05/12/22   [provider]  linaclotide Karlene Einstein) 290 MCG CAPS capsule Take 1 capsule (290 mcg total) by mouth daily before breakfast. 05/06/22   Letta Median, PA-C  loratadine (CLARITIN) 10 MG tablet Take 10 mg by mouth daily.    [provider]  metFORMIN (GLUCOPHAGE) 500 MG tablet Take 500 mg by mouth 2 (two) times daily with a meal.    [provider]  Methylcellulose, Laxative, (FIBER THERAPY PO) Take 1 Scoop by mouth 2 (two) times a week. 1 tablespoon in a full glass of water by mouth 2 times weekly on Tuesday and Thursday. *Brand Fiber Therapy (orange powder)    [provider]  metoprolol succinate (TOPROL-XL) 25 MG 24 hr tablet Take 12.5 mg by mouth daily. 05/12/22   [provider]  Omega-3 Fatty Acids (FISH OIL) 1000 MG CAPS Take 1,000 mg by mouth daily.    [provider]  omeprazole (PRILOSEC) 20 MG capsule Take 1 capsule (20 mg total) by mouth every morning. 10/14/13   Charm Rings, NP  polyethylene glycol powder (GLYCOLAX/MIRALAX) 17 GM/SCOOP powder Take 17 g by mouth 2 (two) times daily. 05/20/22   Letta Median, PA-C  QUEtiapine (SEROQUEL XR) 200 MG 24 hr tablet Take 200 mg by mouth at bedtime. Take with 600 mg for a total of 800 mg at bedtime 07/07/22   [provider]  QUEtiapine (SEROQUEL XR) 300 MG 24 hr tablet Take 600 mg by mouth at bedtime. Take with 200 mg for a total of 800 mg at bedtime 07/08/22   [provider]  traZODone (DESYREL) 100 MG tablet Take 100 mg by mouth at bedtime.    [provider]  Vitamin D, Ergocalciferol, (DRISDOL) 50000 UNITS CAPS capsule Take 1 capsule (50,000 Units total) by mouth 2 (two) times a week. He takes on Tuesdays and Thursdays. Patient taking  differently: Take 50,000 Units  by mouth every 7 (seven) days. Sundays 10/14/13   Charm Rings, NP  zinc gluconate 50 MG tablet Take 50 mg by mouth daily.    [provider]     Allergies:    No Known Allergies   Physical Exam:   Vitals  Blood pressure (!) 116/91, pulse 97, temperature 98.5 F (36.9 C), temperature source Oral, resp. rate (!) 22, height 5\' 1"  (1.549 m), weight 73.3 kg, SpO2 96%.   1. General: Well-developed male, laying in bed, no apparent distress  2. N 300 hearing, but awake, able to answer to his name, otherwise difficult impaired cognition and insight, but this is at baseline as discussed with sisters at bedside  3. No F.N deficits, ALL C.Nerves Intact, Strength 5/5 all 4 extremities, Sensation intact all 4 extremities, Plantars down going.  4. Ears and Eyes appear Normal, Conjunctivae clear, PERRLA.  Dry oral Mucosa.  5. Supple Neck, No JVD, No cervical lymphadenopathy appriciated, No Carotid Bruits.  6. Symmetrical Chest wall movement, Good air movement bilaterally, CTAB.  7. RRR, No Gallops, Rubs or Murmurs, No Parasternal Heave.  8. Positive Bowel Sounds, Abdomen Soft, No tenderness, No organomegaly appriciated,No rebound -guarding or rigidity.  9.  No Cyanosis, Normal Skin Turgor, No Skin Rash or Bruise.  10. Good muscle tone,  joints appear normal , no effusions, Normal ROM.     Data Review:    CBC Recent Labs  Lab 08/27/22 1053  WBC 10.6*  HGB 11.9*  HCT 36.5*  PLT 281  MCV 88.4  MCH 28.8  MCHC 32.6  RDW 13.4  LYMPHSABS 2.0  MONOABS 0.8  EOSABS 0.8*  BASOSABS 0.1   ------------------------------------------------------------------------------------------------------------------  Chemistries  Recent Labs  Lab 08/27/22 1053  NA 136  K 3.4*  CL 102  CO2 15*  GLUCOSE 148*  BUN 16  CREATININE 2.17*  CALCIUM 8.8*  AST 26  ALT 15  ALKPHOS 50  BILITOT 0.4    ------------------------------------------------------------------------------------------------------------------ estimated creatinine clearance is 29.9 mL/min (A) (by C-G formula based on SCr of 2.17 mg/dL (H)). ------------------------------------------------------------------------------------------------------------------ Recent Labs    08/27/22 1053  TSH 5.154*    Coagulation profile Recent Labs  Lab 08/27/22 1053  INR 1.1   ------------------------------------------------------------------------------------------------------------------- No results for input(s): "DDIMER" in the last 72 hours. -------------------------------------------------------------------------------------------------------------------  Cardiac Enzymes No results for input(s): "CKMB", "TROPONINI", "MYOGLOBIN" in the last 168 hours.  Invalid input(s): "CK" ------------------------------------------------------------------------------------------------------------------ No results found for: "BNP"   ---------------------------------------------------------------------------------------------------------------  Urinalysis    Component Value Date/Time   COLORURINE YELLOW (A) 03/17/2022 0120   APPEARANCEUR CLEAR 03/17/2022 0120   LABSPEC 1.020 03/17/2022 0120   PHURINE 6.5 03/17/2022 0120   GLUCOSEU NEGATIVE 03/17/2022 0120   HGBUR NEGATIVE 03/17/2022 0120   BILIRUBINUR NEGATIVE 03/17/2022 0120   KETONESUR TRACE (A) 03/17/2022 0120   PROTEINUR NEGATIVE 03/17/2022 0120   UROBILINOGEN 0.2 08/15/2014 2101   NITRITE NEGATIVE 03/17/2022 0120   LEUKOCYTESUR NEGATIVE 03/17/2022 0120    ----------------------------------------------------------------------------------------------------------------   Imaging Results:    DG Chest Port 1 View  Result Date: 08/27/2022 CLINICAL DATA:  Questionable sepsis - evaluate for abnormality EXAM: PORTABLE CHEST 1 VIEW COMPARISON:  Chest x-ray 09/12/2020.  FINDINGS: Mild streaky bibasilar opacities. Low lung volumes. No definite pleural effusions or visible pneumothorax. Cardiomediastinal silhouette is similar. IMPRESSION: Low lung volumes with mild streaky bibasilar opacities, favor atelectasis. Infection is not excluded. Electronically Signed   By: Feliberto Harts M.D.   On: 08/27/2022 11:27   CT Head Wo Contrast  Result  Date: 08/27/2022 CLINICAL DATA:  Delirium EXAM: CT HEAD WITHOUT CONTRAST TECHNIQUE: Contiguous axial images were obtained from the base of the skull through the vertex without intravenous contrast. RADIATION DOSE REDUCTION: This exam was performed according to the departmental dose-optimization program which includes automated exposure control, adjustment of the mA and/or kV according to patient size and/or use of iterative reconstruction technique. COMPARISON:  Brain MR 08/26/22 FINDINGS: Brain: No evidence of acute infarction, hemorrhage, hydrocephalus, extra-axial collection or mass lesion/mass effect. Sequela of mild chronic microvascular ischemic change. Age-appropriate volume loss. Vascular: No hyperdense vessel or unexpected calcification. Skull: Normal. Negative for fracture or focal lesion. Sinuses/Orbits: Small left-sided mastoid effusion and trace middle ear effusion. There are postsurgical changes a right-sided wall down mastoidectomy with nonspecific soft tissue in the surgical bed. There is a trace right middle ear effusion. Right-sided ossicles are likely surgically absent. Paranasal sinuses are clear. Orbits are unremarkable. Other: None. IMPRESSION: No acute intracranial abnormality. Electronically Signed   By: Lorenza Cambridge M.D.   On: 08/27/2022 11:24   MR BRAIN WO CONTRAST  Result Date: 08/26/2022  [facility] [address] [place], [place] [address] [phone] NEUROIMAGING REPORT STUDY DATE: 08/26/2022 PATIENT NAME: Shawn Baird DOB: 04-29-1960 MRN: [id number] ORDERING CLINICIAN: Dr. Marjory Lies  CLINICAL HISTORY: 62 year old patient being evaluated for gait abnormality COMPARISON FILMS: None available EXAM: MRI brain without contrast TECHNIQUE: Sagittal T1, axial T1, T2, FLAIR, DWI, ADC map, SWI, coronal T2 images were obtained through the brain.  Study slightly suboptimal due to motion artifacts CONTRAST: None IMAGING SITE: [facility] FINDINGS: Study is limited due to motion artifacts.  The brain parenchyma shows no abnormal signal intensities.  No structural lesion, tumor or infarct is noted.  Diffusion-weighted imaging is negative for acute ischemia.  SWI sequences do not show any microhemorrhages.  Subarachnoid spaces and ventricular system show slight dilatation.  There is mild degree of generalized cerebral atrophy which is likely age disproportionate.  Cortical sulci and gyri appear normal.  Extra-axial brain structures appear normal.  Calvarium shows no abnormalities.  Orbits appear unremarkable.  Paranasal sinuses show only minor chronic thickening.  The pituitary gland and cerebellar tonsils appear normal.  Visualized portion of the cervical spine show only minor disc degenerative changes.  Flow-voids of the large vessels are intact and circulation appear to be patent though posterior circulation flow-voids appear to be of diminutive caliber.   Unremarkable MRI scan of the brain without contrast. INTERPRETING PHYSICIAN: PRAMOD SETHI, MD Certified in  Neuroimaging by American Society of Neuroimaging and [employer]   EKG:  Vent. rate 104 BPM PR interval 149 ms QRS duration 138 ms QT/QTcB 378/498 ms P-R-T axes 51 56 28  Assessment & Plan:    Principal Problem:   AKI (acute kidney injury) (HCC) Active Problems:   Diabetes (HCC)   Hypertension   Hyperlipidemia   Schizoaffective disorder (HCC)   GERD (gastroesophageal reflux disease)   Intellectual disability   Schizoaffective disorder, depressive type  (HCC)  AKI Hypotension Dehydration Lactic acidosis Metabolic  acidosis with compensatory respiratory alkalosis -No clear etiology for his hypotension, but he is on antihypertensive medication which I will hold. -No evidence of infectious process. -Lactic acidosis most likely in the setting of hypoperfusion status due to hypotension. -Bicarb at 15, will give oral bicarb replacement today then will DC -Continue with IV fluids -Continue with telemetry monitoring -Keep on telemetry monitoring  HTN -Low blood pressure on presentation, will hold metoprolol and Norvasc  T2DM  -Hold metformin specially in the setting of Actiq acidosis, will keep an insulin sliding scale during hospital stay  AKI on CKD stage III  Plan creatinine 1.5, up to 2.17 on admission, due to hypotension, volume depletion and dehydration, continue with IV fluids and hold nephrotoxic medications -Will check CK given UA positive for blood but negative RBC, and he was found on the floor   Schizoaffective disorder  -Given hypotension I will resume his meds at the half dose, and if back to baseline we will resume full dose tomorrow -Patient with no increased rigidity, no fever, no clinical concerns of serotonin syndrome   GERD  Continue with PPI  Hypokalemia -repleted     DVT Prophylaxis Heparin   AM Labs Ordered, also please review Full Orders  Family Communication: Admission, patients condition and plan of care including tests being ordered have been discussed with the patient and sisters at bedside who indicate understanding and agree with the plan and Code Status.  Code Status Full  Likely DC to  back to ALF  Condition GUARDED    Consults called: none    Admission status: observation    Time spent in minutes : 55 minutes   Huey Bienenstock M.D on 08/27/2022 at 3:51 PM   [facility] - Office  [phone]

## 2022-08-27 NOTE — ED Notes (Signed)
Patient transported to CT 

## 2022-08-28 DIAGNOSIS — I129 Hypertensive chronic kidney disease with stage 1 through stage 4 chronic kidney disease, or unspecified chronic kidney disease: Secondary | ICD-10-CM | POA: Diagnosis present

## 2022-08-28 DIAGNOSIS — Z7982 Long term (current) use of aspirin: Secondary | ICD-10-CM | POA: Diagnosis not present

## 2022-08-28 DIAGNOSIS — M6282 Rhabdomyolysis: Secondary | ICD-10-CM | POA: Diagnosis present

## 2022-08-28 DIAGNOSIS — F251 Schizoaffective disorder, depressive type: Secondary | ICD-10-CM | POA: Diagnosis present

## 2022-08-28 DIAGNOSIS — F79 Unspecified intellectual disabilities: Secondary | ICD-10-CM | POA: Diagnosis present

## 2022-08-28 DIAGNOSIS — N1831 Chronic kidney disease, stage 3a: Secondary | ICD-10-CM | POA: Diagnosis present

## 2022-08-28 DIAGNOSIS — D72828 Other elevated white blood cell count: Secondary | ICD-10-CM | POA: Diagnosis present

## 2022-08-28 DIAGNOSIS — Z7984 Long term (current) use of oral hypoglycemic drugs: Secondary | ICD-10-CM | POA: Diagnosis not present

## 2022-08-28 DIAGNOSIS — I10 Essential (primary) hypertension: Secondary | ICD-10-CM | POA: Diagnosis not present

## 2022-08-28 DIAGNOSIS — F259 Schizoaffective disorder, unspecified: Secondary | ICD-10-CM | POA: Diagnosis not present

## 2022-08-28 DIAGNOSIS — E1122 Type 2 diabetes mellitus with diabetic chronic kidney disease: Secondary | ICD-10-CM | POA: Diagnosis present

## 2022-08-28 DIAGNOSIS — E039 Hypothyroidism, unspecified: Secondary | ICD-10-CM | POA: Diagnosis present

## 2022-08-28 DIAGNOSIS — E860 Dehydration: Secondary | ICD-10-CM | POA: Diagnosis present

## 2022-08-28 DIAGNOSIS — K219 Gastro-esophageal reflux disease without esophagitis: Secondary | ICD-10-CM | POA: Diagnosis present

## 2022-08-28 DIAGNOSIS — E874 Mixed disorder of acid-base balance: Secondary | ICD-10-CM | POA: Diagnosis present

## 2022-08-28 DIAGNOSIS — Z83438 Family history of other disorder of lipoprotein metabolism and other lipidemia: Secondary | ICD-10-CM | POA: Diagnosis not present

## 2022-08-28 DIAGNOSIS — Z79899 Other long term (current) drug therapy: Secondary | ICD-10-CM | POA: Diagnosis not present

## 2022-08-28 DIAGNOSIS — T796XXD Traumatic ischemia of muscle, subsequent encounter: Secondary | ICD-10-CM | POA: Diagnosis not present

## 2022-08-28 DIAGNOSIS — Z823 Family history of stroke: Secondary | ICD-10-CM | POA: Diagnosis not present

## 2022-08-28 DIAGNOSIS — F952 Tourette's disorder: Secondary | ICD-10-CM | POA: Diagnosis present

## 2022-08-28 DIAGNOSIS — T796XXA Traumatic ischemia of muscle, initial encounter: Secondary | ICD-10-CM

## 2022-08-28 DIAGNOSIS — E876 Hypokalemia: Secondary | ICD-10-CM | POA: Diagnosis present

## 2022-08-28 DIAGNOSIS — E785 Hyperlipidemia, unspecified: Secondary | ICD-10-CM | POA: Diagnosis present

## 2022-08-28 DIAGNOSIS — N179 Acute kidney failure, unspecified: Secondary | ICD-10-CM | POA: Diagnosis present

## 2022-08-28 DIAGNOSIS — Z8249 Family history of ischemic heart disease and other diseases of the circulatory system: Secondary | ICD-10-CM | POA: Diagnosis not present

## 2022-08-28 DIAGNOSIS — Z79890 Hormone replacement therapy (postmenopausal): Secondary | ICD-10-CM | POA: Diagnosis not present

## 2022-08-28 LAB — GLUCOSE, CAPILLARY
Glucose-Capillary: 107 mg/dL — ABNORMAL HIGH (ref 70–99)
Glucose-Capillary: 140 mg/dL — ABNORMAL HIGH (ref 70–99)
Glucose-Capillary: 177 mg/dL — ABNORMAL HIGH (ref 70–99)
Glucose-Capillary: 124 mg/dL — ABNORMAL HIGH (ref 70–99)

## 2022-08-28 LAB — CK: Total CK: 9345 U/L — ABNORMAL HIGH (ref 49–397)

## 2022-08-28 MED ORDER — LINACLOTIDE 145 MCG PO CAPS
290.0000 ug | ORAL_CAPSULE | Freq: Every day | ORAL | Status: DC
  Administered 2022-08-29: 290 ug via ORAL
  Filled 2022-08-28: qty 2

## 2022-08-28 MED ORDER — HALOPERIDOL 5 MG PO TABS
5.0000 mg | ORAL_TABLET | Freq: Two times a day (BID) | ORAL | Status: DC
  Administered 2022-08-28 – 2022-08-29 (×2): 5 mg via ORAL
  Filled 2022-08-28 (×2): qty 1

## 2022-08-28 MED ORDER — PANTOPRAZOLE SODIUM 40 MG PO TBEC
40.0000 mg | DELAYED_RELEASE_TABLET | Freq: Every morning | ORAL | Status: DC
  Administered 2022-08-29: 40 mg via ORAL
  Filled 2022-08-28: qty 1

## 2022-08-28 MED ORDER — LORATADINE 10 MG PO TABS
10.0000 mg | ORAL_TABLET | Freq: Every day | ORAL | Status: DC
  Administered 2022-08-28: 10 mg via ORAL
  Filled 2022-08-28: qty 1

## 2022-08-28 MED ORDER — POLYETHYLENE GLYCOL 3350 17 G PO PACK
17.0000 g | PACK | Freq: Two times a day (BID) | ORAL | Status: DC
  Administered 2022-08-28 – 2022-08-29 (×2): 17 g via ORAL
  Filled 2022-08-28 (×2): qty 1

## 2022-08-28 MED ORDER — METOPROLOL SUCCINATE ER 25 MG PO TB24
12.5000 mg | ORAL_TABLET | Freq: Every day | ORAL | Status: DC
  Administered 2022-08-28 – 2022-08-29 (×2): 12.5 mg via ORAL
  Filled 2022-08-28 (×2): qty 1

## 2022-08-28 MED ORDER — DIVALPROEX SODIUM ER 500 MG PO TB24
1000.0000 mg | ORAL_TABLET | Freq: Every day | ORAL | Status: DC
  Administered 2022-08-28: 1000 mg via ORAL
  Filled 2022-08-28: qty 2

## 2022-08-28 MED ORDER — ATORVASTATIN CALCIUM 40 MG PO TABS: 40.0000 mg | ORAL_TABLET | Freq: Every day | ORAL | Status: DC

## 2022-08-28 MED ORDER — QUETIAPINE FUMARATE ER 50 MG PO TB24
300.0000 mg | ORAL_TABLET | Freq: Every day | ORAL | Status: DC
  Administered 2022-08-28: 300 mg via ORAL
  Filled 2022-08-28: qty 6

## 2022-08-28 MED ORDER — ASPIRIN 81 MG PO TBEC
81.0000 mg | DELAYED_RELEASE_TABLET | Freq: Every morning | ORAL | Status: DC
  Administered 2022-08-29: 81 mg via ORAL
  Filled 2022-08-28: qty 1

## 2022-08-28 MED ORDER — TRAZODONE HCL 50 MG PO TABS
100.0000 mg | ORAL_TABLET | Freq: Every day | ORAL | Status: DC
  Administered 2022-08-28: 100 mg via ORAL
  Filled 2022-08-28: qty 2

## 2022-08-28 MED ORDER — LACTATED RINGERS IV SOLN: INTRAVENOUS | Status: DC

## 2022-08-28 NOTE — Evaluation (Signed)
Physical Therapy Evaluation Patient Details Name: Shawn Baird MRN: [id number] DOB: Sep 29, 1960 Today's Date: 08/28/2022  History of Present Illness  Shawn Baird  is a 62 y.o. male, with history of T2DM, HTN, HLD, Schizoaffective d/o, tourette's, mental retardation, pancreatitis who is at [facility] assisted living facility patient with mental retardation at baseline, unreliable historian, history has been obtained from sisters at bedside and ED staff.  -Presents to emergency department for evaluation of hypotension, patient was found on his knees near his closet in the assisted living facility this morning, he was found to be hypotensive, with systolic blood pressure in the 60s, otherwise no history can be obtained from the patient, and no other history provided by staff on the ALF, most recent he was seen 6 weeks ago by his sister at the facility where she reports that he was at baseline where he is ambulatory.  Family unaware of any recent changes of his medications at the facility.  -Presents alert to ED emergency department, but he was noted to be hypotensive, his workup was significant for lactic acidosis 9, and bicarb of 15, potassium of 3.4, creatinine 2.17, up from baseline 1.5, no evidence of infection in the UA or chest x-ray, as well no acute finding and CT head, he responded nicely to fluid bolus, blood pressure has improved, and lactic acid significantly trending down, [facility] hospitalist consulted to admit.   Clinical Impression  Patient demonstrates slow labored movement for sitting up at bedside, very unsteady and unable to maintain standing balance without AD, required use of RW for safety and limited to a couple of side steps before having to sit due to poor standing balance/fall risk.  Patient tolerated sitting up in chair after therapy. Patient will benefit from continued skilled physical therapy in hospital and recommended venue below to increase strength, balance, endurance for safe  ADLs and gait.           If plan is discharge home, recommend the following: A lot of help with bathing/dressing/bathroom;A lot of help with walking and/or transfers;Help with stairs or ramp for entrance;Assistance with cooking/housework   Can travel by private vehicle   No    Equipment Recommendations Rolling walker (2 wheels)  Recommendations for Other Services       Functional Status Assessment Patient has had a recent decline in their functional status and demonstrates the ability to make significant improvements in function in a reasonable and predictable amount of time.     Precautions / Restrictions Precautions Precautions: Fall Restrictions Weight Bearing Restrictions: No      Mobility  Bed Mobility Overal bed mobility: Needs Assistance Bed Mobility: Supine to Sit     Supine to sit: Min assist     General bed mobility comments: unsteady labored movement    Transfers Overall transfer level: Needs assistance Equipment used: Rolling walker (2 wheels), None, 1 person hand held assist Transfers: Sit to/from Stand, Bed to chair/wheelchair/BSC Sit to Stand: Min assist, Mod assist   Step pivot transfers: Mod assist       General transfer comment: had to lean on armrest of char for support when transferring without AD, safer using RW    Ambulation/Gait Ambulation/Gait assistance: Mod assist, Max assist Gait Distance (Feet): 2 Feet Assistive device: Rolling walker (2 wheels) Gait Pattern/deviations: Decreased step length - right, Decreased step length - left, Decreased stride length, Knees buckling Gait velocity: slow     General Gait Details: limited to a couple of side steps before having to  sit due to poor standing balance, fall risk  Stairs            Wheelchair Mobility     Tilt Bed    Modified Rankin (Stroke Patients Only)       Balance Overall balance assessment: Needs assistance Sitting-balance support: Feet supported, No upper  extremity supported Sitting balance-Leahy Scale: Fair Sitting balance - Comments: fair/good seated at EOB   Standing balance support: During functional activity, No upper extremity supported Standing balance-Leahy Scale: Poor Standing balance comment: using RW and without RW                             Pertinent Vitals/Pain Pain Assessment Pain Assessment: Faces Pain Score: 0-No pain    Home Living Family/patient expects to be discharged to:: Assisted living                        Prior Function Prior Level of Function : Patient poor historian/Family not available                     Hand Dominance   Dominant Hand: Right    Extremity/Trunk Assessment   Upper Extremity Assessment Upper Extremity Assessment: Defer to OT evaluation    Lower Extremity Assessment Lower Extremity Assessment: Generalized weakness    Cervical / Trunk Assessment Cervical / Trunk Assessment: Normal  Communication   Communication: Expressive difficulties;Receptive difficulties;Other (comment)  Cognition Arousal/Alertness: Awake/alert Behavior During Therapy: WFL for tasks assessed/performed Overall Cognitive Status: No family/caregiver present to determine baseline cognitive functioning                                          General Comments      Exercises     Assessment/Plan    PT Assessment Patient needs continued PT services  PT Problem List Decreased strength;Decreased activity tolerance;Decreased balance;Decreased mobility       PT Treatment Interventions DME instruction;Gait training;Stair training;Functional mobility training;Therapeutic activities;Therapeutic exercise;Patient/family education;Balance training    PT Goals (Current goals can be found in the Care Plan section)  Acute Rehab PT Goals Patient Stated Goal: not stated PT Goal Formulation: With patient Time For Goal Achievement: 09/11/22 Potential to Achieve Goals:  Good    Frequency Min 3X/week     Co-evaluation PT/OT/SLP Co-Evaluation/Treatment: Yes Reason for Co-Treatment: To address functional/ADL transfers PT goals addressed during session: Mobility/safety with mobility;Balance;Proper use of DME         AM-PAC PT "6 Clicks" Mobility  Outcome Measure Help needed turning from your back to your side while in a flat bed without using bedrails?: A Little Help needed moving from lying on your back to sitting on the side of a flat bed without using bedrails?: A Little Help needed moving to and from a bed to a chair (including a wheelchair)?: A Lot Help needed standing up from a chair using your arms (e.g., wheelchair or bedside chair)?: A Lot Help needed to walk in hospital room?: A Lot Help needed climbing 3-5 steps with a railing? : Total 6 Click Score: 13    End of Session   Activity Tolerance: Patient tolerated treatment well;Patient limited by fatigue Patient left: in chair;with call bell/phone within reach;with chair alarm set Nurse Communication: Mobility status PT Visit Diagnosis: Unsteadiness on feet (R26.81);Other abnormalities of gait  and mobility (R26.89);Muscle weakness (generalized) (M62.81)    Time: 7846-9629 PT Time Calculation (min) (ACUTE ONLY): 18 min   Charges:   PT Evaluation $PT Eval Low Complexity: 1 Low PT Treatments $Therapeutic Activity: 8-22 mins PT General Charges $$ ACUTE PT VISIT: 1 Visit         1:48 PM, 08/28/22 Ocie Bob, MPT Physical Therapist with [facility] 336 [phone] office [phone] mobile phone

## 2022-08-28 NOTE — Plan of Care (Signed)
  Problem: Education: Goal: Knowledge of General Education information will improve Description: Including pain rating scale, medication(s)/side effects and non-pharmacologic comfort measures Outcome: Progressing   Problem: Activity: Goal: Risk for activity intolerance will decrease Outcome: Progressing   Problem: Nutrition: Goal: Adequate nutrition will be maintained Outcome: Progressing   Problem: Coping: Goal: Level of anxiety will decrease Outcome: Progressing   

## 2022-08-28 NOTE — Plan of Care (Signed)
  Problem: Activity: Goal: Risk for activity intolerance will decrease Outcome: Progressing   Problem: Nutrition: Goal: Adequate nutrition will be maintained Outcome: Progressing   

## 2022-08-28 NOTE — Progress Notes (Signed)
PROGRESS NOTE   Shawn Baird  WUJ:[id number] DOB: May 22, 1960 DOA: 08/27/2022 PCP: Christene Lye, FNP   Chief Complaint  Patient presents with   Hypotension   Level of care: Med-Surg  Brief Admission History:  62 y.o. male, with history of T2DM, HTN, HLD, Schizoaffective d/o, tourette's, mental retardation, pancreatitis who is at [facility] assisted living facility patient with mental retardation at baseline, unreliable historian, history has been obtained from sisters at bedside and ED staff. -Presents to emergency department for evaluation of hypotension, patient was found on his knees near his closet in the assisted living facility this morning, he was found to be hypotensive, with systolic blood pressure in the 60s, otherwise no history can be obtained from the patient, and no other history provided by staff on the ALF, most recent he was seen 6 weeks ago by his sister at the facility where she reports that he was at baseline where he is ambulatory.  Family unaware of any recent changes of his medications at the facility. -Presents alert to ED emergency department, but he was noted to be hypotensive, his workup was significant for lactic acidosis 9, and bicarb of 15, potassium of 3.4, creatinine 2.17, up from baseline 1.5, no evidence of infection in the UA or chest x-ray, as well no acute finding and CT head, he responded nicely to fluid bolus, blood pressure has improved, and lactic acid significantly trending down, [facility] hospitalist consulted to admit.   Assessment and Plan:  Acute rhabdomyolysis - after fall and down on floor unknown amount of time - CK level >25K - continue IV fluid hydration - CK trending down with treatments - appreciate PT evaluation - fall precautions  - possible DC 8/3 if continues to improve   Lactic acidosis  - resolved with IV fluid  AKI on CKD stage 3a  - improving with IV fluid hydration   Essential hypertension  - temporarily holding home BP  meds while recovering from severe dehydration   Type 2 DM  - monitoring with sliding scale insulin coverage  Schizoaffective disorder - concerned that high doses of seroquel contributing to rhabdo which is a reported possible adverse side effect - I am reducing down to 300 mg at bedtime down from 800 mg until we can get his CK levels down    DVT prophylaxis: SQ heparin  Code Status: Full  Family Communication:  Disposition: discussed with ALF, they feel he is physically at his baseline and they will take him back when medically cleared  Consultants:   Procedures:   Antimicrobials:    Subjective: Pt only able to communicate by blinking.   Objective: Vitals:   08/27/22 2148 08/28/22 0210 08/28/22 0512 08/28/22 1347  BP: 124/87 134/89 126/80 (!) 132/94  Pulse: 92 77 76 97  Resp: 20  20 (!) 22  Temp: 98.1 F (36.7 C) 97.7 F (36.5 C) 98.3 F (36.8 C)   TempSrc: Oral Oral Oral   SpO2: 97% 98% 98% 99%  Weight:      Height:        Intake/Output Summary (Last 24 hours) at 08/28/2022 1545 Last data filed at 08/28/2022 1300 Gross per 24 hour  Intake 2725 ml  Output 900 ml  Net 1825 ml   Filed Weights   08/27/22 1041  Weight: 73.3 kg   Examination:  General exam: sitting up in chair, Appears calm and comfortable  Respiratory system: Clear to auscultation. Respiratory effort normal. Cardiovascular system: normal S1 & S2 heard. No JVD, murmurs,  rubs, gallops or clicks. No pedal edema. Gastrointestinal system: Abdomen is nondistended, soft and nontender. No organomegaly or masses felt. Normal bowel sounds heard. Central nervous system: Alert. No focal neurological deficits. Extremities: Symmetric 5 x 5 power. Skin: No rashes, lesions or ulcers. Psychiatry: UTD.    Data Reviewed: I have personally reviewed following labs and imaging studies  CBC: Recent Labs  Lab 08/27/22 1053 08/28/22 0607  WBC 10.6* 7.7  NEUTROABS 6.8  --   HGB 11.9* 12.9*  HCT 36.5* 39.0  MCV  88.4 86.9  PLT 281 276    Basic Metabolic Panel: Recent Labs  Lab 08/27/22 1053 08/28/22 0607  NA 136 135  K 3.4* 4.0  CL 102 104  CO2 15* 24  GLUCOSE 148* 130*  BUN 16 9  CREATININE 2.17* 1.37*  CALCIUM 8.8* 8.5*    CBG: Recent Labs  Lab 08/27/22 1036 08/27/22 1656 08/27/22 2119 08/28/22 0800 08/28/22 1140  GLUCAP 170* 114* 164* 107* 140*    Recent Results (from the past 240 hour(s))  Blood Culture (routine x 2)     Status: None (Preliminary result)   Collection Time: 08/27/22 10:53 AM   Specimen: Right Antecubital; Blood  Result Value Ref Range Status   Specimen Description RIGHT ANTECUBITAL  Final   Special Requests   Final    BOTTLES DRAWN AEROBIC AND ANAEROBIC Blood Culture results may not be optimal due to an excessive volume of blood received in culture bottles   Culture   Final    NO GROWTH < 24 HOURS Performed at [facility], [address] [place], [place] [address]    Report Status PENDING  Incomplete  Blood Culture (routine x 2)     Status: None (Preliminary result)   Collection Time: 08/27/22 11:02 AM   Specimen: BLOOD LEFT FOREARM  Result Value Ref Range Status   Specimen Description BLOOD LEFT FOREARM  Final   Special Requests   Final    BOTTLES DRAWN AEROBIC AND ANAEROBIC Blood Culture adequate volume   Culture   Final    NO GROWTH < 24 HOURS Performed at [facility], [address], [place], [place] [address]    Report Status PENDING  Incomplete     Radiology Studies: DG Chest Port 1 View  Result Date: 08/27/2022 CLINICAL DATA:  Questionable sepsis - evaluate for abnormality EXAM: PORTABLE CHEST 1 VIEW COMPARISON:  Chest x-ray 09/12/2020. FINDINGS: Mild streaky bibasilar opacities. Low lung volumes. No definite pleural effusions or visible pneumothorax. Cardiomediastinal silhouette is similar. IMPRESSION: Low lung volumes with mild streaky bibasilar opacities, favor atelectasis. Infection is not excluded. Electronically Signed   By:  Feliberto Harts M.D.   On: 08/27/2022 11:27   CT Head Wo Contrast  Result Date: 08/27/2022 CLINICAL DATA:  Delirium EXAM: CT HEAD WITHOUT CONTRAST TECHNIQUE: Contiguous axial images were obtained from the base of the skull through the vertex without intravenous contrast. RADIATION DOSE REDUCTION: This exam was performed according to the departmental dose-optimization program which includes automated exposure control, adjustment of the mA and/or kV according to patient size and/or use of iterative reconstruction technique. COMPARISON:  Brain MR 08/26/22 FINDINGS: Brain: No evidence of acute infarction, hemorrhage, hydrocephalus, extra-axial collection or mass lesion/mass effect. Sequela of mild chronic microvascular ischemic change. Age-appropriate volume loss. Vascular: No hyperdense vessel or unexpected calcification. Skull: Normal. Negative for fracture or focal lesion. Sinuses/Orbits: Small left-sided mastoid effusion and trace middle ear effusion. There are postsurgical changes a right-sided wall down mastoidectomy with nonspecific soft  tissue in the surgical bed. There is a trace right middle ear effusion. Right-sided ossicles are likely surgically absent. Paranasal sinuses are clear. Orbits are unremarkable. Other: None. IMPRESSION: No acute intracranial abnormality. Electronically Signed   By: Lorenza Cambridge M.D.   On: 08/27/2022 11:24    Scheduled Meds:  heparin  5,000 Units Subcutaneous Q8H   insulin aspart  0-15 Units Subcutaneous TID WC   levothyroxine  25 mcg Oral QAC breakfast   Continuous Infusions:  lactated ringers 125 mL/hr at 08/28/22 0830     LOS: 0 days   Time spent: 41 mins   Laural Benes, MD How to contact the [facility] Attending or Consulting provider 7A - 7P or covering provider during after hours 7P -7A, for this patient?  Check the care team in [facility] and look for a) attending/consulting TRH provider listed and b) the [facility] team listed Log into [email or web address] and use [facility]'s universal password to access. If you do not have the password, please contact the hospital operator. Locate the [facility] provider you are looking for under [facility] and page to a number that you can be directly reached. If you still have difficulty reaching the provider, please page the [facility] (Director on Call) for the Hospitalists listed on amion for assistance.  08/28/2022, 3:45 PM

## 2022-08-28 NOTE — Hospital Course (Signed)
62 y.o. male, with history of T2DM, HTN, HLD, Schizoaffective d/o, tourette's, mental retardation, pancreatitis who is at [facility] assisted living facility patient with mental retardation at baseline, unreliable historian, history has been obtained from sisters at bedside and ED staff. -Presents to emergency department for evaluation of hypotension, patient was found on his knees near his closet in the assisted living facility this morning, he was found to be hypotensive, with systolic blood pressure in the 60s, otherwise no history can be obtained from the patient, and no other history provided by staff on the ALF, most recent he was seen 6 weeks ago by his sister at the facility where she reports that he was at baseline where he is ambulatory.  Family unaware of any recent changes of his medications at the facility. -Presents alert to ED emergency department, but he was noted to be hypotensive, his workup was significant for lactic acidosis 9, and bicarb of 15, potassium of 3.4, creatinine 2.17, up from baseline 1.5, no evidence of infection in the UA or chest x-ray, as well no acute finding and CT head, he responded nicely to fluid bolus, blood pressure has improved, and lactic acid significantly trending down, [facility] hospitalist consulted to admit.

## 2022-08-28 NOTE — Plan of Care (Signed)
  Problem: Acute Rehab PT Goals(only PT should resolve) Goal: Pt Will Go Supine/Side To Sit Outcome: Progressing Flowsheets (Taken 08/28/2022 1349) Pt will go Supine/Side to Sit: with min guard assist Goal: Patient Will Transfer Sit To/From Stand Outcome: Progressing Flowsheets (Taken 08/28/2022 1349) Patient will transfer sit to/from stand:  with minimal assist  with min guard assist Goal: Pt Will Transfer Bed To Chair/Chair To Bed Outcome: Progressing Flowsheets (Taken 08/28/2022 1349) Pt will Transfer Bed to Chair/Chair to Bed:  min guard assist  with min assist Goal: Pt Will Ambulate Outcome: Progressing Flowsheets (Taken 08/28/2022 1349) Pt will Ambulate:  15 feet  with minimal assist  with moderate assist  with rolling walker   1:49 PM, 08/28/22 Ocie Bob, MPT Physical Therapist with [facility] [phone] office [phone] mobile phone

## 2022-08-28 NOTE — TOC Initial Note (Signed)
Transition of Care [facility]) - Initial/Assessment Note    Patient Details  Name: Shawn Baird MRN: [id number] Date of Birth: October 16, 1960  Transition of Care [facility]) CM/SW Contact:    Annice Needy, LCSW Phone Number: 08/28/2022, 3:00 PM  Clinical Narrative:                 Patient from [facility]. Kim at the facility reports that he has been week recently and was supposed to have a MRI to identify if his weakness was a result of Parkinson's disease. She stated that patient uses a walker and wheelchair but due to weakness he has been using the wheelchair. PT evaluation reviewed with Selena Batten. She indicated that patient could return over the weekend and requested that [facility] [place].   Expected Discharge Plan: Assisted Living Barriers to Discharge: Continued Medical Work up   Patient Goals and CMS Choice            Expected Discharge Plan and Services       Living arrangements for the past 2 months: Assisted Living Facility                                      Prior Living Arrangements/Services Living arrangements for the past 2 months: Assisted Living Facility Lives with:: Facility Resident Patient language and need for interpreter reviewed:: Yes            Current home services: DME Criminal Activity/Legal Involvement Pertinent to Current Situation/Hospitalization: No - Comment as needed  Activities of Daily Living Home Assistive Devices/Equipment: None ADL Screening (condition at time of admission) Patient's cognitive ability adequate to safely complete daily activities?: No Is the patient deaf or have difficulty hearing?: No Does the patient have difficulty seeing, even when wearing glasses/contacts?: No Does the patient have difficulty concentrating, remembering, or making decisions?: Yes Patient able to express need for assistance with ADLs?: No Does the patient have difficulty dressing or bathing?: Yes Independently performs  ADLs?: No Communication: Independent Dressing (OT): Needs assistance Is this a change from baseline?: Pre-admission baseline Grooming: Needs assistance Is this a change from baseline?: Pre-admission baseline Feeding: Independent Bathing: Needs assistance Is this a change from baseline?: Pre-admission baseline Toileting: Needs assistance Is this a change from baseline?: Pre-admission baseline In/Out Bed: Needs assistance Is this a change from baseline?: Pre-admission baseline Walks in Home: Needs assistance Is this a change from baseline?: Pre-admission baseline Does the patient have difficulty walking or climbing stairs?: Yes Weakness of Legs: Both Weakness of Arms/Hands: Both  Permission Sought/Granted Permission sought to share information with : Facility Medical sales representative    Share Information with NAME: Kim at facility           Emotional Assessment       Orientation: : Oriented to Self Alcohol / Substance Use: Not Applicable    Admission diagnosis:  AKI (acute kidney injury) (HCC) [N17.9] Rhabdomyolysis [M62.82] Patient Active Problem List   Diagnosis Date Noted   Rhabdomyolysis 08/28/2022   AKI (acute kidney injury) (HCC) 08/27/2022   Schizoaffective disorder, depressive type (HCC)    Intellectual disability 08/26/2012   Aggressive behavior of adult 06/28/2012   Gastroparesis 03/22/2012   Transaminitis 03/11/2012   Abdominal  pain, other specified site with nausea and vomiting, likely due to viral gastroenteritis versus diabetic gastroparesis 01/15/2012   Diabetes (HCC) 01/15/2012   Hypertension 01/15/2012   Hyperlipidemia  01/15/2012   Schizoaffective disorder (HCC) 01/15/2012   Chronic constipation 01/15/2012   GERD (gastroesophageal reflux disease) 01/15/2012   Left Renal calcification 01/15/2012   PCP:  Christene Lye, FNP Pharmacy:   [facility] - [place], [place] - [facility] AT Portal to Registered [address] [place] [address] Phone: [phone] Fax: [phone]  [facility] [place], [place] - [address] [place] [place] [address] Phone: [phone] Fax: [phone]     Social Determinants of Health (SDOH) Social History: SDOH Screenings   Food Insecurity: No Food Insecurity (08/27/2022)  Housing: Low Risk  (08/27/2022)  Transportation Needs: No Transportation Needs (08/27/2022)  Utilities: Not At Risk (08/27/2022)  Tobacco Use: Low Risk  (08/27/2022)   SDOH Interventions:     Readmission Risk Interventions     No data to display

## 2022-08-28 NOTE — Evaluation (Signed)
Occupational Therapy Evaluation Patient Details Name: Armin Yerger MRN: [id number] DOB: [birth year] Today's Date: 08/28/2022   History of Present Illness Shawn Baird  is a 62 y.o. male, with history of T2DM, HTN, HLD, Schizoaffective d/o, tourette's, mental retardation, pancreatitis who is at [facility] assisted living facility patient with mental retardation at baseline, unreliable historian, history has been obtained from sisters at bedside and ED staff.  -Presents to emergency department for evaluation of hypotension, patient was found on his knees near his closet in the assisted living facility this morning, he was found to be hypotensive, with systolic blood pressure in the 60s, otherwise no history can be obtained from the patient, and no other history provided by staff on the ALF, most recent he was seen 6 weeks ago by his sister at the facility where she reports that he was at baseline where he is ambulatory.  Family unaware of any recent changes of his medications at the facility.  -Presents alert to ED emergency department, but he was noted to be hypotensive, his workup was significant for lactic acidosis 9, and bicarb of 15, potassium of 3.4, creatinine 2.17, up from baseline 1.5, no evidence of infection in the UA or chest x-ray, as well no acute finding and CT head, he responded nicely to fluid bolus, blood pressure has improved, and lactic acid significantly trending down, [facility] hospitalist consulted to admit. (per MD)   Clinical Impression   Pt agreeable to PT and OT co-evaluation. Much of pt history is unknown. Pt reportedly came from ALF but prior functional history is unknown. Today pt required min A for bed mobility and min to mod A for transfers. Min to Mod A for lower body dressing due to difficulty with R LE sock. Pt was left in the chair with call bell within reach and chair alarm set. Pt will benefit from continued OT in the hospital and recommended venue below to increase  strength, balance, and endurance for safe ADL's.        Recommendations for follow up therapy are one component of a multi-disciplinary discharge planning process, led by the attending physician.  Recommendations may be updated based on patient status, additional functional criteria and insurance authorization.   Assistance Recommended at Discharge Intermittent Supervision/Assistance  Patient can return home with the following A lot of help with walking and/or transfers;A lot of help with bathing/dressing/bathroom;Assistance with cooking/housework;Assist for transportation;Help with stairs or ramp for entrance    Functional Status Assessment  Patient has had a recent decline in their functional status and demonstrates the ability to make significant improvements in function in a reasonable and predictable amount of time.  Equipment Recommendations  None recommended by OT           Precautions / Restrictions Precautions Precautions: Fall Restrictions Weight Bearing Restrictions: No      Mobility Bed Mobility Overal bed mobility: Needs Assistance Bed Mobility: Supine to Sit     Supine to sit: Min assist     General bed mobility comments: Single hand held assist to pull to sit.    Transfers Overall transfer level: Needs assistance Equipment used: Rolling walker (2 wheels) Transfers: Sit to/from Stand, Bed to chair/wheelchair/BSC Sit to Stand: Min assist, Mod assist     Step pivot transfers: Min assist, Mod assist     General transfer comment: Slow labored movement; tremors noted when standing. Mod A without AD; min to mod with AD for sit to stand.      Balance Overall balance  assessment: Needs assistance Sitting-balance support: Bilateral upper extremity supported, Feet supported Sitting balance-Leahy Scale: Fair Sitting balance - Comments: fair to good at EOB   Standing balance support: Bilateral upper extremity supported, During functional activity, Reliant on  assistive device for balance Standing balance-Leahy Scale: Poor Standing balance comment: using RW and without RW                           ADL either performed or assessed with clinical judgement   ADL Overall ADL's : Needs assistance/impaired Eating/Feeding: Set up;Sitting   Grooming: Set up;Sitting   Upper Body Bathing: Set up;Sitting   Lower Body Bathing: Min to Moderate assistance;Sitting/lateral leans   Upper Body Dressing : Set up;Sitting   Lower Body Dressing: Min to Moderate assistance;Sitting/lateral leans Lower Body Dressing Details (indicate cue type and reason): Based on difficulty donning sock. Toilet Transfer: Moderate assistance;Minimal assistance;Stand-pivot;Rolling walker (2 wheels) Toilet Transfer Details (indicate cue type and reason): Simulated via EOB to chair. Toileting- Clothing Manipulation and Hygiene: Moderate assistance;Sitting/lateral lean       Functional mobility during ADLs: Moderate assistance;Rolling walker (2 wheels)       Vision Patient Visual Report: Other (comment) (Vision history unknown.) Vision Assessment?: No apparent visual deficits Additional Comments: No obvious impairements during functional tasks.     Perception Perception Perception Tested?: No   Praxis Praxis Praxis tested?: Not tested    Pertinent Vitals/Pain Pain Assessment Pain Assessment: Faces Faces Pain Scale: No hurt     Hand Dominance Right   Extremity/Trunk Assessment Upper Extremity Assessment Upper Extremity Assessment: Generalized weakness   Lower Extremity Assessment Lower Extremity Assessment: Defer to PT evaluation   Cervical / Trunk Assessment Cervical / Trunk Assessment: Normal   Communication Communication Communication: Expressive difficulties;Receptive difficulties;Other (comment) (Non-verbal other that slight murmuring.)   Cognition Arousal/Alertness: Awake/alert Behavior During Therapy: WFL for tasks  assessed/performed Overall Cognitive Status: No family/caregiver present to determine baseline cognitive functioning                                 General Comments: Tactile cues to follow one step commands.                      Home Living Family/patient expects to be discharged to:: Assisted living                                        Prior Functioning/Environment Prior Level of Function : Patient poor historian/Family not available                        OT Problem List: Decreased strength;Decreased activity tolerance;Impaired balance (sitting and/or standing);Decreased cognition      OT Treatment/Interventions: Self-care/ADL training;Therapeutic exercise;Therapeutic activities;Patient/family education;Balance training;DME and/or AE instruction;Cognitive remediation/compensation    OT Goals(Current goals can be found in the care plan section) Acute Rehab OT Goals Patient Stated Goal: none stated OT Goal Formulation: Patient unable to participate in goal setting Time For Goal Achievement: 09/11/22 Potential to Achieve Goals:  (no goal stated)  OT Frequency: Min 2X/week    Co-evaluation PT/OT/SLP Co-Evaluation/Treatment: Yes Reason for Co-Treatment: To address functional/ADL transfers   OT goals addressed during session: ADL's and self-care      AM-PAC OT "6 Clicks" Daily Activity  Outcome Measure Help from another person eating meals?: A Little Help from another person taking care of personal grooming?: A Little Help from another person toileting, which includes using toliet, bedpan, or urinal?: A Lot Help from another person bathing (including washing, rinsing, drying)?: A Lot Help from another person to put on and taking off regular upper body clothing?: A Little Help from another person to put on and taking off regular lower body clothing?: A Lot 6 Click Score: 15   End of Session Equipment Utilized During  Treatment: Rolling walker (2 wheels);Gait belt  Activity Tolerance: Patient tolerated treatment well Patient left: in chair;with call bell/phone within reach;with chair alarm set  OT Visit Diagnosis: Unsteadiness on feet (R26.81);Other abnormalities of gait and mobility (R26.89);Muscle weakness (generalized) (M62.81);Cognitive communication deficit (R41.841)                Time: 1308-6578 OT Time Calculation (min): 14 min Charges:  OT General Charges $OT Visit: 1 Visit OT Evaluation $OT Eval Low Complexity: 1 Low    OT, MOT   Danie Chandler 08/28/2022, 9:27 AM

## 2022-08-28 NOTE — Plan of Care (Signed)
  Problem: Acute Rehab OT Goals (only OT should resolve) Goal: Pt. Will Perform Grooming Flowsheets (Taken 08/28/2022 0930) Pt Will Perform Grooming:  with modified independence  standing Goal: Pt. Will Perform Lower Body Bathing Flowsheets (Taken 08/28/2022 0930) Pt Will Perform Lower Body Bathing:  with modified independence  sitting/lateral leans Goal: Pt. Will Perform Lower Body Dressing Flowsheets (Taken 08/28/2022 0930) Pt Will Perform Lower Body Dressing:  with modified independence  sitting/lateral leans Goal: Pt. Will Transfer To Toilet Flowsheets (Taken 08/28/2022 0930) Pt Will Transfer to Toilet:  with modified independence  ambulating Goal: Pt. Will Perform Toileting-Clothing Manipulation Flowsheets (Taken 08/28/2022 0930) Pt Will Perform Toileting - Clothing Manipulation and hygiene:  with modified independence  sitting/lateral leans Goal: Pt/Caregiver Will Perform Home Exercise Program Flowsheets (Taken 08/28/2022 0930) Pt/caregiver will Perform Home Exercise Program:  Increased strength  Both right and left upper extremity  Independently    OT, MOT

## 2022-08-29 DIAGNOSIS — I10 Essential (primary) hypertension: Secondary | ICD-10-CM | POA: Diagnosis not present

## 2022-08-29 DIAGNOSIS — F259 Schizoaffective disorder, unspecified: Secondary | ICD-10-CM | POA: Diagnosis not present

## 2022-08-29 DIAGNOSIS — T796XXD Traumatic ischemia of muscle, subsequent encounter: Secondary | ICD-10-CM

## 2022-08-29 DIAGNOSIS — N179 Acute kidney failure, unspecified: Secondary | ICD-10-CM | POA: Diagnosis not present

## 2022-08-29 LAB — GLUCOSE, CAPILLARY
Glucose-Capillary: 85 mg/dL (ref 70–99)
Glucose-Capillary: 129 mg/dL — ABNORMAL HIGH (ref 70–99)

## 2022-08-29 MED ORDER — ATORVASTATIN CALCIUM 40 MG PO TABS: 40.0000 mg | ORAL_TABLET | Freq: Every day | ORAL | Status: AC

## 2022-08-29 MED ORDER — VITAMIN D (ERGOCALCIFEROL) 1.25 MG (50000 UNIT) PO CAPS: 50000.0000 [IU] | ORAL_CAPSULE | ORAL | Status: AC

## 2022-08-29 MED ORDER — FENOFIBRATE 54 MG PO TABS: 54.0000 mg | ORAL_TABLET | Freq: Every day | ORAL | Status: DC

## 2022-08-29 MED ORDER — METFORMIN HCL 500 MG PO TABS: 500.0000 mg | ORAL_TABLET | Freq: Two times a day (BID) | ORAL | Status: AC

## 2022-08-29 MED ORDER — DIVALPROEX SODIUM ER 500 MG PO TB24: 1000.0000 mg | ORAL_TABLET | Freq: Every day | ORAL | Status: AC

## 2022-08-29 MED ORDER — QUETIAPINE FUMARATE ER 400 MG PO TB24: 400.0000 mg | ORAL_TABLET | Freq: Every day | ORAL | 0 refills | Status: AC

## 2022-08-29 MED ORDER — EZETIMIBE 10 MG PO TABS: 10.0000 mg | ORAL_TABLET | Freq: Every day | ORAL | Status: AC

## 2022-08-29 NOTE — TOC Progression Note (Addendum)
Transition of Care [facility]) - Progression Note    Patient Details  Name: Shawn Baird MRN: [id number] Date of Birth: 07/05/1960  Transition of Care [facility]) CM/SW Contact  Catalina Gravel, LCSW Phone Number: 08/29/2022, 12:56 PM  Clinical Narrative:     Pt DC ready, CSW sent FL2 and DC Summary to fcility, facility had clarifying questions. CSW consulted with MD.  Script set to [facility] locally.  Facility unable to transport, [facility] will be contacted.  Attempt to reach sister to advise DC,no response. CSW reached out to [facility] to refer for HHPT.  Provider will accept.  Response pending.   Expected Discharge Plan: Assisted Living Barriers to Discharge: No Barriers Identified  Expected Discharge Plan and Services       Living arrangements for the past 2 months: Assisted Living Facility Expected Discharge Date: 08/29/22                                     Social Determinants of Health (SDOH) Interventions SDOH Screenings   Food Insecurity: No Food Insecurity (08/27/2022)  Housing: Low Risk  (08/27/2022)  Transportation Needs: No Transportation Needs (08/27/2022)  Utilities: Not At Risk (08/27/2022)  Tobacco Use: Low Risk  (08/27/2022)    Readmission Risk Interventions     No data to display

## 2022-08-29 NOTE — Discharge Instructions (Signed)
PATIENT NEEDS TO FOLLOW UP WITH PSYCHIATRIST ASAP TO REVIEW MEDICATIONS GIVEN RHABDOMYOLYSIS CAN BE CAUSED BY SEROQUEL AND OTHER PSYCHIATRIC MEDS   IMPORTANT INFORMATION: PAY CLOSE ATTENTION   PHYSICIAN DISCHARGE INSTRUCTIONS  Follow with Primary care provider  Christene Lye, FNP  and other consultants as instructed by your Hospitalist Physician  SEEK MEDICAL CARE OR RETURN TO EMERGENCY ROOM IF SYMPTOMS COME BACK, WORSEN OR NEW PROBLEM DEVELOPS   Please note: You were cared for by a hospitalist during your hospital stay. Every effort will be made to forward records to your primary care provider.  You can request that your primary care provider send for your hospital records if they have not received them.  Once you are discharged, your primary care physician will handle any further medical issues. Please note that NO REFILLS for any discharge medications will be authorized once you are discharged, as it is imperative that you return to your primary care physician (or establish a relationship with a primary care physician if you do not have one) for your post hospital discharge needs so that they can reassess your need for medications and monitor your lab values.  Please get a complete blood count and chemistry panel checked by your Primary MD at your next visit, and again as instructed by your Primary MD.  Get Medicines reviewed and adjusted: Please take all your medications with you for your next visit with your Primary MD  Laboratory/radiological data: Please request your Primary MD to go over all hospital tests and procedure/radiological results at the follow up, please ask your primary care provider to get all Hospital records sent to his/her office.  In some cases, they will be blood work, cultures and biopsy results pending at the time of your discharge. Please request that your primary care provider follow up on these results.  If you are diabetic, please bring your blood sugar  readings with you to your follow up appointment with primary care.    Please call and make your follow up appointments as soon as possible.    Also Note the following: If you experience worsening of your admission symptoms, develop shortness of breath, life threatening emergency, suicidal or homicidal thoughts you must seek medical attention immediately by calling 911 or calling your MD immediately  if symptoms less severe.  You must read complete instructions/literature along with all the possible adverse reactions/side effects for all the Medicines you take and that have been prescribed to you. Take any new Medicines after you have completely understood and accpet all the possible adverse reactions/side effects.   Do not drive when taking Pain medications or sleeping medications (Benzodiazepines)  Do not take more than prescribed Pain, Sleep and Anxiety Medications. It is not advisable to combine anxiety,sleep and pain medications without talking with your primary care practitioner  Special Instructions: If you have smoked or chewed Tobacco  in the last 2 yrs please stop smoking, stop any regular Alcohol  and or any Recreational drug use.  Wear Seat belts while driving.  Do not drive if taking any narcotic, mind altering or controlled substances or recreational drugs or alcohol.

## 2022-08-29 NOTE — Discharge Summary (Signed)
Physician Discharge Summary  Shawn Baird AOZ:[id number] DOB: [birth year] DOA: 08/27/2022  PCP: Christene Lye, FNP  Admit date: 08/27/2022 Discharge date: 08/29/2022  Admitted From:  ALF Disposition: ALF   Recommendations for Outpatient Follow-up:  Pt needs to follow up with his psychiatrist ASAP to review and adjust psychiatric medications Seroquel (Quetiapine) dose was reduced because it can cause rhabdomyolysis in high doses Please check CPK level in 5-7 days  Please obtain BMP in 5-7 days to follow up renal function and electrolytes  Home Health: PT   Discharge Condition: STABLE   CODE STATUS: FULL DIET: resume previous home diet    Brief Hospitalization Summary: Please see all hospital notes, images, labs for full details of the hospitalization. ADMISSION PROVIDER HPI:  62 y.o. male, with history of T2DM, HTN, HLD, Schizoaffective d/o, tourette's, mental retardation, pancreatitis who is at [facility] assisted living facility patient with mental retardation at baseline, unreliable historian, history has been obtained from sisters at bedside and ED staff. -Presents to emergency department for evaluation of hypotension, patient was found on his knees near his closet in the assisted living facility this morning, he was found to be hypotensive, with systolic blood pressure in the 60s, otherwise no history can be obtained from the patient, and no other history provided by staff on the ALF, most recent he was seen 6 weeks ago by his sister at the facility where she reports that he was at baseline where he is ambulatory.  Family unaware of any recent changes of his medications at the facility. -Presents alert to ED emergency department, but he was noted to be hypotensive, his workup was significant for lactic acidosis 9, and bicarb of 15, potassium of 3.4, creatinine 2.17, up from baseline 1.5, no evidence of infection in the UA or chest x-ray, as well no acute finding and CT head, he responded  nicely to fluid bolus, blood pressure has improved, and lactic acid significantly trending down, [facility] hospitalist consulted to admit.    Hospital Course by problem   Acute rhabdomyolysis - after fall and down on floor unknown amount of time - CK level >25K on admission - he responded well to IV fluid hydration - CK trending down with treatments - CK has come down nicely with treatments and now down to 8K - He is eating and drinking well and ambulating in room - his high doses of quetiapine can cause rhabdomyolysis, we have reduced dose to 400 mg daily down from 800 mg daily - recommend rechecking CK level in 5-7 days - recommend pt see his psychiatrist ASAP to review/adjust his psychiatric medication due to rhabdomyolysis - appreciate PT evaluation - fall precautions recommended - ALF contacted and felt patient at baseline and can return to ALF   Lactic acidosis  - resolved with IV fluid treatment    AKI on CKD stage 3a  - creatinine improved with IV fluid hydration    Essential hypertension  - resumed metoprolol and amlodipine    Type 2 DM  - monitored with sliding scale insulin coverage  CBG (last 3)  Recent Labs    08/28/22 1657 08/28/22 2108 08/29/22 0706  GLUCAP 177* 124* 85   Schizoaffective disorder - I am concerned that the high doses of seroquel contributing to rhabdo which is a reported possible adverse side effect - I am reducing down to 400 mg at bedtime down from 800 mg and strongly advised patient to follow up with his behavioral health prescriber to review/adjust his medications  Discharge Diagnoses:  Principal Problem:   AKI (acute kidney injury) (HCC) Active Problems:   Diabetes (HCC)   Hypertension   Hyperlipidemia   Schizoaffective disorder (HCC)   GERD (gastroesophageal reflux disease)   Intellectual disability   Schizoaffective disorder, depressive type (HCC)   Rhabdomyolysis   Discharge Instructions:  Allergies as of 08/29/2022   No  Known Allergies      Medication List     TAKE these medications    amLODipine 5 MG tablet Commonly known as: NORVASC Take 1 tablet (5 mg total) by mouth every morning.   aspirin EC 81 MG tablet Take 1 tablet (81 mg total) by mouth every morning.   atorvastatin 40 MG tablet Commonly known as: LIPITOR Take 1 tablet (40 mg total) by mouth daily. Start taking on: September 01, 2022 What changed: These instructions start on September 01, 2022. If you are unsure what to do until then, ask your doctor or other care provider.   divalproex 500 MG 24 hr tablet Commonly known as: DEPAKOTE ER Take 2 tablets (1,000 mg total) by mouth at bedtime.   ezetimibe 10 MG tablet Commonly known as: ZETIA Take 1 tablet (10 mg total) by mouth daily. Start taking on: September 01, 2022 What changed: These instructions start on September 01, 2022. If you are unsure what to do until then, ask your doctor or other care provider.   fenofibrate 54 MG tablet Take 1 tablet (54 mg total) by mouth daily. Start taking on: September 01, 2022 What changed: These instructions start on September 01, 2022. If you are unsure what to do until then, ask your doctor or other care provider.   FIBER THERAPY PO Take 1 Scoop by mouth See admin instructions. 1 tablespoon in a full glass of water by mouth 2 times weekly on Tuesday and Thursday.   Fish Oil 1000 MG Caps Take 1,000 mg by mouth daily.   haloperidol 5 MG tablet Commonly known as: HALDOL Take 5 mg by mouth 2 (two) times daily.   levothyroxine 25 MCG tablet Commonly known as: SYNTHROID Take 25 mcg by mouth daily before breakfast.   linaclotide 290 MCG Caps capsule Commonly known as: Linzess Take 1 capsule (290 mcg total) by mouth daily before breakfast.   loratadine 10 MG tablet Commonly known as: CLARITIN Take 10 mg by mouth at bedtime.   metFORMIN 500 MG tablet Commonly known as: GLUCOPHAGE Take 1 tablet (500 mg total) by mouth 2 (two) times daily. Start taking on:  August 31, 2022 What changed: These instructions start on August 31, 2022. If you are unsure what to do until then, ask your doctor or other care provider.   metoprolol succinate 25 MG 24 hr tablet Commonly known as: TOPROL-XL Take 12.5 mg by mouth daily.   omeprazole 20 MG capsule Commonly known as: PRILOSEC Take 1 capsule (20 mg total) by mouth every morning.   polyethylene glycol powder 17 GM/SCOOP powder Commonly known as: GLYCOLAX/MIRALAX Take 17 g by mouth 2 (two) times daily.   QUEtiapine 400 MG 24 hr tablet Commonly known as: SEROQUEL XR Take 1 tablet (400 mg total) by mouth at bedtime. What changed:  medication strength how much to take Another medication with the same name was removed. Continue taking this medication, and follow the directions you see here.   traZODone 100 MG tablet Commonly known as: DESYREL Take 100 mg by mouth at bedtime.   Vitamin D (Ergocalciferol) 1.25 MG (50000 UNIT) Caps capsule Commonly known as:  DRISDOL Take 1 capsule (50,000 Units total) by mouth every Sunday. Start taking on: August 30, 2022   Zinc 50 MG Tabs Take 50 mg by mouth daily.        Contact information for follow-up providers     Psychiatrist. Schedule an appointment as soon as possible for a visit in 1 week(s).   Why: Hospital Follow Up to adjust meds             Contact information for after-discharge care     Destination     [facility] .   Service: Assisted Living Contact information: [address] [place] [address] [phone]                    No Known Allergies Allergies as of 08/29/2022   No Known Allergies      Medication List     TAKE these medications    amLODipine 5 MG tablet Commonly known as: NORVASC Take 1 tablet (5 mg total) by mouth every morning.   aspirin EC 81 MG tablet Take 1 tablet (81 mg total) by mouth every morning.   atorvastatin 40 MG tablet Commonly known as:  LIPITOR Take 1 tablet (40 mg total) by mouth daily. Start taking on: September 01, 2022 What changed: These instructions start on September 01, 2022. If you are unsure what to do until then, ask your doctor or other care provider.   divalproex 500 MG 24 hr tablet Commonly known as: DEPAKOTE ER Take 2 tablets (1,000 mg total) by mouth at bedtime.   ezetimibe 10 MG tablet Commonly known as: ZETIA Take 1 tablet (10 mg total) by mouth daily. Start taking on: September 01, 2022 What changed: These instructions start on September 01, 2022. If you are unsure what to do until then, ask your doctor or other care provider.   fenofibrate 54 MG tablet Take 1 tablet (54 mg total) by mouth daily. Start taking on: September 01, 2022 What changed: These instructions start on September 01, 2022. If you are unsure what to do until then, ask your doctor or other care provider.   FIBER THERAPY PO Take 1 Scoop by mouth See admin instructions. 1 tablespoon in a full glass of water by mouth 2 times weekly on Tuesday and Thursday.   Fish Oil 1000 MG Caps Take 1,000 mg by mouth daily.   haloperidol 5 MG tablet Commonly known as: HALDOL Take 5 mg by mouth 2 (two) times daily.   levothyroxine 25 MCG tablet Commonly known as: SYNTHROID Take 25 mcg by mouth daily before breakfast.   linaclotide 290 MCG Caps capsule Commonly known as: Linzess Take 1 capsule (290 mcg total) by mouth daily before breakfast.   loratadine 10 MG tablet Commonly known as: CLARITIN Take 10 mg by mouth at bedtime.   metFORMIN 500 MG tablet Commonly known as: GLUCOPHAGE Take 1 tablet (500 mg total) by mouth 2 (two) times daily. Start taking on: August 31, 2022 What changed: These instructions start on August 31, 2022. If you are unsure what to do until then, ask your doctor or other care provider.   metoprolol succinate 25 MG 24 hr tablet Commonly known as: TOPROL-XL Take 12.5 mg by mouth daily.   omeprazole 20 MG capsule Commonly known as:  PRILOSEC Take 1 capsule (20 mg total) by mouth every morning.   polyethylene glycol powder 17 GM/SCOOP powder Commonly known as: GLYCOLAX/MIRALAX Take 17 g by mouth 2 (  two) times daily.   QUEtiapine 400 MG 24 hr tablet Commonly known as: SEROQUEL XR Take 1 tablet (400 mg total) by mouth at bedtime. What changed:  medication strength how much to take Another medication with the same name was removed. Continue taking this medication, and follow the directions you see here.   traZODone 100 MG tablet Commonly known as: DESYREL Take 100 mg by mouth at bedtime.   Vitamin D (Ergocalciferol) 1.25 MG (50000 UNIT) Caps capsule Commonly known as: DRISDOL Take 1 capsule (50,000 Units total) by mouth every Sunday. Start taking on: August 30, 2022   Zinc 50 MG Tabs Take 50 mg by mouth daily.        Procedures/Studies: DG Chest Port 1 View  Result Date: 08/27/2022 CLINICAL DATA:  Questionable sepsis - evaluate for abnormality EXAM: PORTABLE CHEST 1 VIEW COMPARISON:  Chest x-ray 09/12/2020. FINDINGS: Mild streaky bibasilar opacities. Low lung volumes. No definite pleural effusions or visible pneumothorax. Cardiomediastinal silhouette is similar. IMPRESSION: Low lung volumes with mild streaky bibasilar opacities, favor atelectasis. Infection is not excluded. Electronically Signed   By: Feliberto Harts M.D.   On: 08/27/2022 11:27   CT Head Wo Contrast  Result Date: 08/27/2022 CLINICAL DATA:  Delirium EXAM: CT HEAD WITHOUT CONTRAST TECHNIQUE: Contiguous axial images were obtained from the base of the skull through the vertex without intravenous contrast. RADIATION DOSE REDUCTION: This exam was performed according to the departmental dose-optimization program which includes automated exposure control, adjustment of the mA and/or kV according to patient size and/or use of iterative reconstruction technique. COMPARISON:  Brain MR 08/26/22 FINDINGS: Brain: No evidence of acute infarction, hemorrhage,  hydrocephalus, extra-axial collection or mass lesion/mass effect. Sequela of mild chronic microvascular ischemic change. Age-appropriate volume loss. Vascular: No hyperdense vessel or unexpected calcification. Skull: Normal. Negative for fracture or focal lesion. Sinuses/Orbits: Small left-sided mastoid effusion and trace middle ear effusion. There are postsurgical changes a right-sided wall down mastoidectomy with nonspecific soft tissue in the surgical bed. There is a trace right middle ear effusion. Right-sided ossicles are likely surgically absent. Paranasal sinuses are clear. Orbits are unremarkable. Other: None. IMPRESSION: No acute intracranial abnormality. Electronically Signed   By: Lorenza Cambridge M.D.   On: 08/27/2022 11:24   MR BRAIN WO CONTRAST  Result Date: 08/26/2022  [facility] [address], [address] [place], [place] [address] [phone] NEUROIMAGING REPORT STUDY DATE: 08/26/2022 PATIENT NAME: Flor Houdeshell DOB: 06/24/1960 MRN: [id number] ORDERING CLINICIAN: Dr. Marjory Lies CLINICAL HISTORY: 62 year old patient being evaluated for gait abnormality COMPARISON FILMS: None available EXAM: MRI brain without contrast TECHNIQUE: Sagittal T1, axial T1, T2, FLAIR, DWI, ADC map, SWI, coronal T2 images were obtained through the brain.  Study slightly suboptimal due to motion artifacts CONTRAST: None IMAGING SITE: [facility] FINDINGS: Study is limited due to motion artifacts.  The brain parenchyma shows no abnormal signal intensities.  No structural lesion, tumor or infarct is noted.  Diffusion-weighted imaging is negative for acute ischemia.  SWI sequences do not show any microhemorrhages.  Subarachnoid spaces and ventricular system show slight dilatation.  There is mild degree of generalized cerebral atrophy which is likely age disproportionate.  Cortical sulci and gyri appear normal.  Extra-axial brain structures appear normal.  Calvarium shows no abnormalities.  Orbits appear  unremarkable.  Paranasal sinuses show only minor chronic thickening.  The pituitary gland and cerebellar tonsils appear normal.  Visualized portion of the cervical spine show only minor disc degenerative changes.  Flow-voids of the large vessels  are intact and circulation appear to be patent though posterior circulation flow-voids appear to be of diminutive caliber.   Unremarkable MRI scan of the brain without contrast. INTERPRETING PHYSICIAN: PRAMOD SETHI, MD Certified in  Neuroimaging by American Society of Neuroimaging and [employer]    Subjective: Pt has remained stable, eating and drinking well and sitting up in chair in room.    Discharge Exam: Vitals:   08/28/22 2100 08/29/22 0451  BP: (!) 132/94 125/83  Pulse: 71 70  Resp: 20 19  Temp: 97.6 F (36.4 C) (!) 97.5 F (36.4 C)  SpO2: 100% 95%   Vitals:   08/28/22 0512 08/28/22 1347 08/28/22 2100 08/29/22 0451  BP: 126/80 (!) 132/94 (!) 132/94 125/83  Pulse: 76 97 71 70  Resp: 20 (!) 22 20 19   Temp: 98.3 F (36.8 C)  97.6 F (36.4 C) (!) 97.5 F (36.4 C)  TempSrc: Oral  Oral Oral  SpO2: 98% 99% 100% 95%  Weight:      Height:       General: Pt is alert, awake, not in acute distress Cardiovascular: normal S1/S2 +, no rubs, no gallops Respiratory: CTA bilaterally, no wheezing, no rhonchi Abdominal: Soft, NT, ND, bowel sounds + Extremities: no edema, no cyanosis   The results of significant diagnostics from this hospitalization (including imaging, microbiology, ancillary and laboratory) are listed below for reference.     Microbiology: Recent Results (from the past 240 hour(s))  Blood Culture (routine x 2)     Status: None (Preliminary result)   Collection Time: 08/27/22 10:53 AM   Specimen: Right Antecubital; Blood  Result Value Ref Range Status   Specimen Description RIGHT ANTECUBITAL  Final   Special Requests   Final    BOTTLES DRAWN AEROBIC AND ANAEROBIC Blood Culture results  may not be optimal due to an excessive volume of blood received in culture bottles   Culture   Final    NO GROWTH 2 DAYS Performed at [facility], [address] [place], [place] [address]    Report Status PENDING  Incomplete  Blood Culture (routine x 2)     Status: None (Preliminary result)   Collection Time: 08/27/22 11:02 AM   Specimen: BLOOD LEFT FOREARM  Result Value Ref Range Status   Specimen Description BLOOD LEFT FOREARM  Final   Special Requests   Final    BOTTLES DRAWN AEROBIC AND ANAEROBIC Blood Culture adequate volume   Culture   Final    NO GROWTH 2 DAYS Performed at [facility], [address] [place], [place] [address]    Report Status PENDING  Incomplete     Labs: BNP (last 3 results) No results for input(s): "BNP" in the last 8760 hours. Basic Metabolic Panel: Recent Labs  Lab 08/27/22 1053 08/28/22 0607 08/29/22 0435  NA 136 135 136  K 3.4* 4.0 4.2  CL 102 104 104  CO2 15* 24 22  GLUCOSE 148* 130* 101*  BUN 16 9 8   CREATININE 2.17* 1.37* 1.14  CALCIUM 8.8* 8.5* 8.8*  MG  --   --  1.8   Liver Function Tests: Recent Labs  Lab 08/27/22 1053  AST 26  ALT 15  ALKPHOS 50  BILITOT 0.4  PROT 7.0  ALBUMIN 3.5   No results for input(s): "LIPASE", "AMYLASE" in the last 168 hours. No results for input(s): "AMMONIA" in the last 168 hours. CBC: Recent Labs  Lab 08/27/22 1053 08/28/22 0607  WBC 10.6* 7.7  NEUTROABS  6.8  --   HGB 11.9* 12.9*  HCT 36.5* 39.0  MCV 88.4 86.9  PLT 281 276   Cardiac Enzymes: Recent Labs  Lab 08/27/22 2223 08/28/22 0607 08/29/22 0435  CKTOTAL 24,415* 9,345* 8,704*   BNP: Invalid input(s): "POCBNP" CBG: Recent Labs  Lab 08/28/22 0800 08/28/22 1140 08/28/22 1657 08/28/22 2108 08/29/22 0706  GLUCAP 107* 140* 177* 124* 85   D-Dimer No results for input(s): "DDIMER" in the last 72 hours. Hgb A1c Recent Labs    08/27/22 1053  HGBA1C 6.1*   Lipid Profile No results for input(s): "CHOL", "HDL",  "LDLCALC", "TRIG", "CHOLHDL", "LDLDIRECT" in the last 72 hours. Thyroid function studies Recent Labs    08/27/22 1053  TSH 5.154*   Anemia work up No results for input(s): "VITAMINB12", "FOLATE", "FERRITIN", "TIBC", "IRON", "RETICCTPCT" in the last 72 hours. Urinalysis    Component Value Date/Time   COLORURINE STRAW (A) 08/27/2022 1742   APPEARANCEUR CLEAR 08/27/2022 1742   LABSPEC 1.006 08/27/2022 1742   PHURINE 7.0 08/27/2022 1742   GLUCOSEU NEGATIVE 08/27/2022 1742   HGBUR LARGE (A) 08/27/2022 1742   BILIRUBINUR NEGATIVE 08/27/2022 1742   KETONESUR NEGATIVE 08/27/2022 1742   PROTEINUR 30 (A) 08/27/2022 1742   UROBILINOGEN 0.2 08/15/2014 2101   NITRITE NEGATIVE 08/27/2022 1742   LEUKOCYTESUR NEGATIVE 08/27/2022 1742   Sepsis Labs Recent Labs  Lab 08/27/22 1053 08/28/22 0607  WBC 10.6* 7.7   Microbiology Recent Results (from the past 240 hour(s))  Blood Culture (routine x 2)     Status: None (Preliminary result)   Collection Time: 08/27/22 10:53 AM   Specimen: Right Antecubital; Blood  Result Value Ref Range Status   Specimen Description RIGHT ANTECUBITAL  Final   Special Requests   Final    BOTTLES DRAWN AEROBIC AND ANAEROBIC Blood Culture results may not be optimal due to an excessive volume of blood received in culture bottles   Culture   Final    NO GROWTH 2 DAYS Performed at [facility], [address], [place], [place] [address]    Report Status PENDING  Incomplete  Blood Culture (routine x 2)     Status: None (Preliminary result)   Collection Time: 08/27/22 11:02 AM   Specimen: BLOOD LEFT FOREARM  Result Value Ref Range Status   Specimen Description BLOOD LEFT FOREARM  Final   Special Requests   Final    BOTTLES DRAWN AEROBIC AND ANAEROBIC Blood Culture adequate volume   Culture   Final    NO GROWTH 2 DAYS Performed at [facility], [address], [place], [place] [address]    Report Status PENDING  Incomplete   Time coordinating discharge: 41 mins    SIGNED:  Standley Dakins, MD  [facility] 08/29/2022, 10:58 AM How to contact the [facility] Attending or Consulting provider 7A - 7P or covering provider during after hours 7P -7A, for this patient?  Check the care team in [facility] and look for a) attending/consulting [facility] provider listed and b) the [facility] team listed Log into [email or web address] and use [place]'s universal password to access. If you do not have the password, please contact the hospital operator. Locate the [facility] provider you are looking for under [facility] and page to a number that you can be directly reached. If you still have difficulty reaching the provider, please page the [facility] (Director on Call) for the Hospitalists listed on amion for assistance.

## 2022-08-29 NOTE — [place] FL2 (Signed)
[place] MEDICAID FL2 LEVEL OF CARE FORM     IDENTIFICATION  Patient Name: Shawn Baird Birthdate: 1960/03/04 Sex: male Admission Date (Current Location): 08/27/2022  [place] and [place] Number:  Aaron Edelman [id number] R Facility and Address:  [facility],  [address] [place] [address]      Provider Number: [id number]  Attending Physician Name and Address:  Cleora Fleet, MD  Relative Name and Phone Number:  Skylier, Kretschmer (Sister)  [phone]    Current Level of Care: Hospital Recommended Level of Care: Assisted Living Facility Prior Approval Number:    Date Approved/Denied:   PASRR Number:    Discharge Plan: Domiciliary (Rest home) [facility])    Current Diagnoses: Patient Active Problem List   Diagnosis Date Noted   Rhabdomyolysis 08/28/2022   AKI (acute kidney injury) (HCC) 08/27/2022   Schizoaffective disorder, depressive type (HCC)    Intellectual disability 08/26/2012   Aggressive behavior of adult 06/28/2012   Gastroparesis 03/22/2012   Transaminitis 03/11/2012   Abdominal  pain, other specified site with nausea and vomiting, likely due to viral gastroenteritis versus diabetic gastroparesis 01/15/2012   Diabetes (HCC) 01/15/2012   Hypertension 01/15/2012   Hyperlipidemia 01/15/2012   Schizoaffective disorder (HCC) 01/15/2012   Chronic constipation 01/15/2012   GERD (gastroesophageal reflux disease) 01/15/2012   Left Renal calcification 01/15/2012    Orientation RESPIRATION BLADDER Height & Weight     Self  Normal Continent, Incontinent Weight: 161 lb 9.6 oz (73.3 kg) Height:  5\' 1"  (154.9 cm)  BEHAVIORAL SYMPTOMS/MOOD NEUROLOGICAL BOWEL NUTRITION STATUS      Continent, Incontinent Diet  AMBULATORY STATUS COMMUNICATION OF NEEDS Skin   Extensive Assist (uses wc in facility) Verbally Normal                       Personal Care Assistance Level of Assistance  Bathing, Feeding, Dressing Bathing Assistance: Limited  assistance   Dressing Assistance: Limited assistance     Functional Limitations Info  Sight, Hearing, Speech Sight Info: Adequate Hearing Info: Impaired (per faciity patient is very HOH but will read lips and answer if you stand directly in front of his face.) Speech Info: Adequate    SPECIAL CARE FACTORS FREQUENCY  PT (By licensed PT)     PT Frequency: 3x/week              Contractures Contractures Info: Not present    Additional Factors Info  Code Status, Allergies, Psychotropic Code Status Info: Full Code Allergies Info: NKA Psychotropic Info: Depakote ER, Haldol, Seroquel         Current Medications (08/29/2022):  This is the current hospital active medication list Current Facility-Administered Medications  Medication Dose Route Frequency Provider Last Rate Last Admin   aspirin EC tablet 81 mg  81 mg Oral q morning Johnson, Clanford L, MD   81 mg at 08/29/22 0813   divalproex (DEPAKOTE ER) 24 hr tablet 1,000 mg  1,000 mg Oral QHS Johnson, Clanford L, MD   1,000 mg at 08/28/22 2206   haloperidol (HALDOL) tablet 5 mg  5 mg Oral BID Laural Benes, Clanford L, MD   5 mg at 08/29/22 0812   heparin injection 5,000 Units  5,000 Units Subcutaneous Q8H Elgergawy, Leana Roe, MD   5,000 Units at 08/29/22 0604   hydrALAZINE (APRESOLINE) injection 5 mg  5 mg Intravenous Q4H PRN Elgergawy, Leana Roe, MD       insulin aspart (novoLOG) injection 0-15 Units  0-15 Units Subcutaneous TID  WC Elgergawy, Leana Roe, MD   3 Units at 08/28/22 1709   lactated ringers infusion   Intravenous Continuous Laural Benes, Clanford L, MD 140 mL/hr at 08/29/22 0238 New Bag at 08/29/22 0238   levothyroxine (SYNTHROID) tablet 25 mcg  25 mcg Oral QAC breakfast Elgergawy, Leana Roe, MD   25 mcg at 08/29/22 0604   linaclotide (LINZESS) capsule 290 mcg  290 mcg Oral QAC breakfast Laural Benes, Clanford L, MD   290 mcg at 08/29/22 0812   loratadine (CLARITIN) tablet 10 mg  10 mg Oral QHS Johnson, Clanford L, MD   10 mg at 08/28/22  2207   metoprolol succinate (TOPROL-XL) 24 hr tablet 12.5 mg  12.5 mg Oral Daily Johnson, Clanford L, MD   12.5 mg at 08/29/22 0813   pantoprazole (PROTONIX) EC tablet 40 mg  40 mg Oral q morning Johnson, Clanford L, MD   40 mg at 08/29/22 0813   polyethylene glycol (MIRALAX / GLYCOLAX) packet 17 g  17 g Oral BID Johnson, Clanford L, MD   17 g at 08/29/22 9562   QUEtiapine (SEROQUEL XR) 24 hr tablet 300 mg  300 mg Oral QHS Johnson, Clanford L, MD   300 mg at 08/28/22 2207   traZODone (DESYREL) tablet 100 mg  100 mg Oral QHS Johnson, Clanford L, MD   100 mg at 08/28/22 2207     Discharge Medications: Please see discharge summary for a list of discharge medications.  Relevant Imaging Results:  Relevant Lab Results:   Additional Information [id number]  Catalina Gravel, LCSW

## 2022-08-30 NOTE — Progress Notes (Unsigned)
Shawn Baird, male    DOB: 1961-01-11    MRN: [id number]   Brief patient profile:  86  yobm  never smoker  referred to pulmonary clinic in [place]  08/31/2022 by [facility]  for f/u rhabdo/ acute renal failure    Admit date: 08/27/2022 Discharge date: 08/29/2022   Admitted From:  ALF Disposition: ALF    Recommendations for Outpatient Follow-up:  Pt needs to follow up with his psychiatrist ASAP to review and adjust psychiatric medications Seroquel (Quetiapine) dose was reduced because it can cause rhabdomyolysis in high doses Please check CPK level in 5-7 days  Please obtain BMP in 5-7 days to follow up renal function and electrolytes   Home Health: PT    Discharge Condition: STABLE   CODE STATUS: FULL DIET: resume previous home diet     Brief Hospitalization Summary: Please see all hospital notes, images, labs for full details of the hospitalization. ADMISSION PROVIDER HPI:  62 y.o. male, with history of T2DM, HTN, HLD, Schizoaffective d/o, tourette's, mental retardation, pancreatitis who is at [facility] assisted living facility patient with mental retardation at baseline, unreliable historian, history has been obtained from sisters at bedside and ED staff. -Presents to emergency department for evaluation of hypotension, patient was found on his knees near his closet in the assisted living facility this morning, he was found to be hypotensive, with systolic blood pressure in the 60s, otherwise no history can be obtained from the patient, and no other history provided by staff on the ALF, most recent he was seen 6 weeks PTA by his sister at the facility where she reports that he was at baseline where he is ambulatory.  Family unaware of any recent changes of his medications at the facility. -Presents alert to ED emergency department, but he was noted to be hypotensive, his workup was significant for lactic acidosis 9, and bicarb of 15, potassium of 3.4, creatinine 2.17, up from baseline  1.5, no evidence of infection in the UA or chest x-ray, as well no acute finding and CT head, he responded nicely to fluid bolus, blood pressure has improved, and lactic acid significantly trending down, [facility] hospitalist consulted to admit.     Hospital Course by problem    Acute rhabdomyolysis - after fall and down on floor unknown amount of time - CK level >25K on admission - he responded well to IV fluid hydration - CK trending down with treatments - CK has come down nicely with treatments and now down to 8K - He is eating and drinking well and ambulating in room - his high doses of quetiapine can cause rhabdomyolysis, we have reduced dose to 400 mg daily down from 800 mg daily - recommend rechecking CK level in 5-7 days - recommend pt see his psychiatrist ASAP to review/adjust his psychiatric medication due to rhabdomyolysis - appreciate PT evaluation - fall precautions recommended - ALF contacted and felt patient at baseline and can return to ALF   Lactic acidosis  - resolved with IV fluid treatment    AKI on CKD stage 3a  - creatinine improved with IV fluid hydration    Essential hypertension  - resumed metoprolol and amlodipine    Type 2 DM  - monitored with sliding scale insulin coverage  CBG (last 3)  Recent Labs (last 2 labs)       Recent Labs    08/28/22 1657 08/28/22 2108 08/29/22 0706  GLUCAP 177* 124* 85      Schizoaffective disorder -  I am concerned that the high doses of seroquel contributing to rhabdo which is a reported possible adverse side effect - I am reducing down to 400 mg at bedtime down from 800 mg and strongly advised patient to follow up with his behavioral health prescriber to review/adjust his medications       Discharge Diagnoses:  Principal Problem:   AKI (acute kidney injury) (HCC) Active Problems:   Diabetes (HCC)   Hypertension   Hyperlipidemia   Schizoaffective disorder (HCC)   GERD (gastroesophageal reflux disease)    Intellectual disability   Schizoaffective disorder, depressive type (HCC)   Rhabdomyolysis     History of Present Illness  08/31/2022  Pulmonary/ 1st office eval/  / [place] [facility]  Chief Complaint  Patient presents with   Establish Care   Shortness of Breath   Dyspnea:  walked his nl walk at SNF on day of eval no sob / holding on to rail  Cough: no  Sleep: ok  SABA use: none 02: none   No obvious day to day or daytime pattern/variability or assoc excess/ purulent sputum or mucus plugs or hemoptysis or cp or chest tightness, subjective wheeze or overt sinus or hb symptoms.   Sleeping by reports  without nocturnal  or early am exacerbation  of respiratory  c/o's or need for noct saba. Also denies any obvious fluctuation of symptoms with weather or environmental changes or other aggravating or alleviating factors except as outlined above   No unusual exposure hx or h/o childhood pna/ asthma or knowledge of premature birth.  Current Allergies, Complete Past Medical History, Past Surgical History, Family History, and Social History were reviewed in [employer] record.  ROS  The following are not active complaints unless bolded Hoarseness, sore throat, dysphagia, dental problems, itching, sneezing,  nasal congestion or discharge of excess mucus or purulent secretions, ear ache,   fever, chills, sweats, unintended wt loss or wt gain, classically pleuritic or exertional cp,  orthopnea pnd or arm/hand swelling  or leg swelling, presyncope, palpitations, abdominal pain, anorexia, nausea, vomiting, diarrhea  or change in bowel habits or change in bladder habits, change in stools or change in urine, dysuria, hematuria,  rash, arthralgias, visual complaints, headache, numbness, weakness or ataxia or problems with walking or coordination,  change in mood or  memory.              Outpatient Medications Prior to Visit  Medication Sig Dispense Refill   amLODipine  (NORVASC) 5 MG tablet Take 1 tablet (5 mg total) by mouth every morning.     aspirin EC 81 MG tablet Take 1 tablet (81 mg total) by mouth every morning.     [START ON 09/01/2022] atorvastatin (LIPITOR) 40 MG tablet Take 1 tablet (40 mg total) by mouth daily.     divalproex (DEPAKOTE ER) 500 MG 24 hr tablet Take 2 tablets (1,000 mg total) by mouth at bedtime.     [START ON 09/01/2022] ezetimibe (ZETIA) 10 MG tablet Take 1 tablet (10 mg total) by mouth daily.     [START ON 09/01/2022] fenofibrate 54 MG tablet Take 1 tablet (54 mg total) by mouth daily.     haloperidol (HALDOL) 5 MG tablet Take 5 mg by mouth 2 (two) times daily.     levothyroxine (SYNTHROID) 25 MCG tablet Take 25 mcg by mouth daily before breakfast.     linaclotide (LINZESS) 290 MCG CAPS capsule Take 1 capsule (290 mcg total) by mouth daily before breakfast.  30 capsule 5   loratadine (CLARITIN) 10 MG tablet Take 10 mg by mouth at bedtime.     metFORMIN (GLUCOPHAGE) 500 MG tablet Take 1 tablet (500 mg total) by mouth 2 (two) times daily.     Methylcellulose, Laxative, (FIBER THERAPY PO) Take 1 Scoop by mouth See admin instructions. 1 tablespoon in a full glass of water by mouth 2 times weekly on Tuesday and Thursday.     metoprolol succinate (TOPROL-XL) 25 MG 24 hr tablet Take 12.5 mg by mouth daily.     Omega-3 Fatty Acids (FISH OIL) 1000 MG CAPS Take 1,000 mg by mouth daily.     omeprazole (PRILOSEC) 20 MG capsule Take 1 capsule (20 mg total) by mouth every morning.     polyethylene glycol powder (GLYCOLAX/MIRALAX) 17 GM/SCOOP powder Take 17 g by mouth 2 (two) times daily. 850 g 3   QUEtiapine (SEROQUEL XR) 400 MG 24 hr tablet Take 1 tablet (400 mg total) by mouth at bedtime. 30 tablet 0   traZODone (DESYREL) 100 MG tablet Take 100 mg by mouth at bedtime.     Vitamin D, Ergocalciferol, (DRISDOL) 1.25 MG (50000 UNIT) CAPS capsule Take 1 capsule (50,000 Units total) by mouth every Sunday.     Zinc 50 MG TABS Take 50 mg by mouth daily.      No facility-administered medications prior to visit.    Past Medical History:  Diagnosis Date   Anxiety    Cancer (HCC)    Chronic constipation    Diabetes mellitus    HOH (hard of hearing)    Hyperlipemia    Hypertension    Hypothyroidism    Impulsive control disorder   Mental disorder    Mental retardation    Otitis media    Pancreatitis    Schizoaffective disorder (HCC)    Tourette's disease       Objective:     BP 103/68   Pulse 88   Ht 5\' 1"  (1.549 m)   Wt 161 lb (73 kg)   SpO2 93%   BMI 30.42 kg/m   SpO2: 93 % RA  W/c bound unusual affect, knows name and that's it    HEENT : Oropharynx  clear       NECK :  without  apparent JVD/ palpable Nodes/TM    LUNGS: no acc muscle use,  Nl contour chest which is clear to A and P bilaterally without cough on insp or exp maneuvers   CV:  RRR  no s3 or murmur or increase in P2, and no edema   ABD:  soft and nontender with nl inspiratory excursion in the supine position. No bruits or organomegaly appreciated   MS:  Nl gait/ ext warm without deformities Or obvious joint restrictions  calf tenderness, cyanosis or clubbing - no swollen or tender muscles identified.   SKIN: warm and dry without lesions    NEURO:  alert, approp, nl sensorium with  no motor or cerebellar deficits apparent.     I personally reviewed images and agree with radiology impression as follows:  CXR:   portable 08/27/22  Low lung volumes with mild streaky bibasilar opacities, favor atelectasis. Infection is not excluded.     Lab Results  Component Value Date   NA 139 08/31/2022   K 4.4 08/31/2022   CO2 21 08/31/2022   GLUCOSE 115 (H) 08/31/2022   BUN 11 08/31/2022   CREATININE 1.36 (H) 08/31/2022   CALCIUM 9.1 08/31/2022   EGFR 58 (L)  08/31/2022   GFRNONAA >60 08/29/2022     Lab Results  Component Value Date   CKTOTAL 8,704 (H) 08/29/2022   Lab Results  Component Value Date   CKTOTAL 3,793 [facility]) 08/31/2022    Assessment    AKI (acute kidney injury) (HCC) See admit 08/27/22 with rhabdo p being found down at [facility]   Lab Results  Component Value Date   CREATININE 1.36 (H) 08/31/2022   CREATININE 1.14 08/29/2022   CREATININE 1.37 (H) 08/28/2022     Marked improvement clinically and by labs/ no pulmonary problem identified so f/u in pulmonary clinic is prn      Lab Results  Component Value Date   CKTOTAL 8,704 (H) 08/29/2022    Lab Results  Component Value Date   CKTOTAL 3,793 [facility]) 08/31/2022    Trends are in the right direction but will need f/u in a week in SNF with cpk/bmet and re-eval sooner if new muscle complaints or darkening urine with efforts to mobilize as much as tol in the meantime.  Total time for H and P, chart review, counseling, reviewing   and generating customized AVS unique to this office visit / same day charting = 30 min with pt new to me.          Sandrea Hughs, MD 08/31/2022

## 2022-08-31 ENCOUNTER — Encounter: Payer: Self-pay | Admitting: Internal Medicine

## 2022-08-31 ENCOUNTER — Ambulatory Visit (INDEPENDENT_AMBULATORY_CARE_PROVIDER_SITE_OTHER): Payer: 59 | Admitting: Internal Medicine

## 2022-08-31 VITALS — BP 103/68 | HR 88 | Ht 61.0 in | Wt 161.0 lb

## 2022-08-31 DIAGNOSIS — N179 Acute kidney failure, unspecified: Secondary | ICD-10-CM

## 2022-08-31 NOTE — Patient Instructions (Signed)
No change in medications   Please remember to go to the lab department   for your tests - we will call you with the results when they are available.      Pulmonary follow up is as needed

## 2022-09-01 ENCOUNTER — Encounter: Payer: Self-pay | Admitting: Internal Medicine

## 2022-09-01 NOTE — Assessment & Plan Note (Addendum)
See admit 08/27/22 with rhabdo p being found down at [facility]   Lab Results  Component Value Date   CREATININE 1.36 (H) 08/31/2022   CREATININE 1.14 08/29/2022   CREATININE 1.37 (H) 08/28/2022     Marked improvement clinically and by labs/ no pulmonary problem identified so f/u in pulmonary clinic is prn      Lab Results  Component Value Date   CKTOTAL 8,704 (H) 08/29/2022    Lab Results  Component Value Date   CKTOTAL 3,793 [facility]) 08/31/2022    Trends are in the right direction but will need f/u in a week in SNF with cpk/bmet and re-eval sooner if new muscle complaints or darkening urine with efforts to mobilize as much as tol in the meantime.  Total time for H and P, chart review, counseling, reviewing   and generating customized AVS unique to this office visit / same day charting = 30 min with pt new to me.

## 2022-09-11 ENCOUNTER — Other Ambulatory Visit: Payer: Self-pay | Admitting: Gastroenterology

## 2022-09-16 ENCOUNTER — Telehealth: Payer: Self-pay | Admitting: Neurology

## 2022-09-16 NOTE — Telephone Encounter (Signed)
Attempted to call the patient to review MRI brain results. There was no asnwer and VM was full  "IMPRESSION: Unremarkable MRI scan of the brain without contrast."   **If pt returns call, please advise the patient MRI of the brain was normal and there was nothing that appeared concerning.

## 2022-09-22 NOTE — Telephone Encounter (Signed)
Called Patient and No answer or Voicemail. We try calling patient back at a later time.

## 2022-09-23 ENCOUNTER — Ambulatory Visit: Payer: 59 | Attending: Internal Medicine | Admitting: Internal Medicine

## 2022-09-23 ENCOUNTER — Encounter: Payer: Self-pay | Admitting: Internal Medicine

## 2022-09-23 VITALS — BP 130/80 | HR 85 | Ht 64.0 in | Wt 161.0 lb

## 2022-09-23 DIAGNOSIS — E782 Mixed hyperlipidemia: Secondary | ICD-10-CM | POA: Diagnosis not present

## 2022-09-23 DIAGNOSIS — R0609 Other forms of dyspnea: Secondary | ICD-10-CM | POA: Diagnosis not present

## 2022-09-23 DIAGNOSIS — E785 Hyperlipidemia, unspecified: Secondary | ICD-10-CM | POA: Diagnosis not present

## 2022-09-23 NOTE — Progress Notes (Signed)
Cardiology Office Note  Date: 09/23/2022   ID: Shawn Baird, DOB 07-29-1960, MRN [id number]  PCP:  Shawn Lye, FNP  Cardiologist:  Shawn Bicker, MD Electrophysiologist:  None   History of Present Illness: Shawn Baird is a 62 y.o. male known to have HTN, DM 2, HLD, seizure affective disorder, Tourette's disease, mental retardation was referred to cardiology clinic for evaluation of DOE.  Patient lives in a facility and his caretaker noticed DOE x 3 to 4 months, cannot walk from his room to the dining room without getting out of breath.  No orthopnea, PND or leg swelling.  No angina, dizziness, syncope.  Patient is hard of hearing and whispers when he talks.  His sister is next of kin.  But he can provide consent.  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     aspirin EC 81 MG tablet Take 1 tablet (81 mg total) by mouth every morning.     atorvastatin (LIPITOR) 40 MG tablet Take 1 tablet (40 mg total) by mouth daily.     divalproex (DEPAKOTE ER) 500 MG 24 hr tablet Take 2 tablets  (1,000 mg total) by mouth at bedtime.     ezetimibe (ZETIA) 10 MG tablet Take 1 tablet (10 mg total) by mouth daily.     fenofibrate 54 MG tablet Take 1 tablet (54 mg total) by mouth daily.     haloperidol (HALDOL) 5 MG tablet Take 5 mg by mouth 2 (two) times daily.     levothyroxine (SYNTHROID) 25 MCG tablet Take 25 mcg by mouth daily before breakfast.     linaclotide (LINZESS) 290 MCG CAPS capsule Take 1 capsule (290 mcg total) by mouth daily before breakfast. 30 capsule 5   loratadine (CLARITIN) 10 MG tablet Take 10 mg by mouth at bedtime.     metFORMIN (GLUCOPHAGE) 500 MG tablet Take 1 tablet (500 mg total) by mouth 2 (two) times daily.     Methylcellulose, Laxative, (FIBER THERAPY PO) Take 1 Scoop by mouth See admin instructions. 1 tablespoon in a full glass of water by mouth 2 times weekly on Tuesday and Thursday.     metoprolol succinate (TOPROL-XL) 25 MG 24 hr tablet Take 12.5 mg by mouth daily.     Omega-3 Fatty Acids (FISH OIL) 1000 MG CAPS Take 1,000 mg by mouth daily.     omeprazole (PRILOSEC) 20 MG capsule Take 1 capsule (20 mg total)  by mouth every morning.     polyethylene glycol (MIRALAX / GLYCOLAX) 17 g packet mix 17 grams in 8 oz of water or juice and take BY MOUTH TWICE DAILY FOR constipation 60 packet 5   polyethylene glycol powder (GLYCOLAX/MIRALAX) 17 GM/SCOOP powder Take 17 g by mouth 2 (two) times daily. 850 g 3   QUEtiapine (SEROQUEL XR) 400 MG 24 hr tablet Take 1 tablet (400 mg total) by mouth at bedtime. 30 tablet 0   traZODone (DESYREL) 100 MG tablet Take 100 mg by mouth at bedtime.     Vitamin D, Ergocalciferol, (DRISDOL) 1.25 MG (50000 UNIT) CAPS capsule Take 1 capsule (50,000 Units total) by mouth every Sunday.     Zinc 50 MG TABS Take 50 mg by mouth daily.     No current facility-administered medications for this visit.   Allergies:  Patient has no known allergies.   Social History: The patient  reports that he has never smoked. He has never used smokeless  tobacco. He reports that he does not drink alcohol and does not use drugs.   Family History: The patient's family history includes Heart disease in his mother; High Cholesterol in his sister; High blood pressure in his sister; Stroke in his mother.   ROS:  Please see the history of present illness. Otherwise, complete review of systems is positive for none  All other systems are reviewed and negative.   Physical Exam: VS:  BP 130/80   Pulse 85   Ht 5\' 4"  (1.626 m)   Wt 161 lb (73 kg)   SpO2 95%   BMI 27.64 kg/m , BMI Body mass index is 27.64 kg/m.  Wt Readings from Last 3 Encounters:  09/23/22 161 lb (73 kg)  08/31/22 161 lb (73 kg)  08/27/22 161 lb 9.6 oz (73.3 kg)    General: Patient appears comfortable at rest. HEENT: Conjunctiva and lids normal, oropharynx clear with moist mucosa. Neck: Supple, no elevated JVP or carotid bruits, no thyromegaly. Lungs: Clear to auscultation, nonlabored breathing at rest. Cardiac: Regular rate and rhythm, no S3 or significant systolic murmur, no pericardial rub. Abdomen: Soft, nontender, no hepatomegaly, bowel sounds present, no guarding or rebound. Extremities: No pitting edema, distal pulses 2+. Skin: Warm and dry. Musculoskeletal: No kyphosis. Neuropsychiatric: Alert and oriented x3, affect grossly appropriate.  Recent Labwork: 08/27/2022: ALT 15; AST 26; TSH 5.154 08/29/2022: Magnesium 1.8 08/31/2022: BUN 11; Creatinine, Ser 1.36; Hemoglobin 11.7; Platelets 263; Potassium 4.4; Sodium 139  No results found for: "CHOL", "TRIG", "HDL", "CHOLHDL", "VLDL", "LDLCALC", "LDLDIRECT"    Assessment and Plan:   DOE: Ongoing DOE x 3 to 4 months, can walk only from his room to the dining room and gets out of breath.  No swelling, orthopnea, PND. Obtain 2D echocardiogram and Lexiscan.  If normal, follow-up with PCP for noncardiac causes of DOE.  History obtained from caretaker.  Patient is hard of hearing and whispers when he talks.  His sister is next of  kin but he can provide consent for stress test.  HLD, hypertriglyceridemia: I reviewed the lipid panel from 05/2021 that showed elevated triglycerides 261 and elevated LDL of 170.  Currently on atorvastatin 40 mg nightly, Zetia 10 mg once daily, fenofibrate 54 mg once daily and fish oil supplements 1000 mg once daily.  Will need to repeat lipid panel to make any changes to current medications.      Medication Adjustments/Labs and Tests Ordered: Current medicines are reviewed at length with the patient today.  Concerns regarding medicines are outlined above.    Disposition:  Follow up  6 months  Signed Corderius Saraceni Verne Spurr, MD, 09/23/2022 3:12 PM    [facility] [address] [place], [place], [place] [address]

## 2022-09-23 NOTE — Patient Instructions (Signed)
Medication Instructions:  Your physician recommends that you continue on your current medications as directed. Please refer to the Current Medication list given to you today.   Labwork: Lipids to be completed at [facility] or [facility]  Testing/Procedures: Your physician has requested that you have an echocardiogram. Echocardiography is a painless test that uses sound waves to create images of your heart. It provides your doctor with information about the size and shape of your heart and how well your heart's chambers and valves are working. This procedure takes approximately one hour. There are no restrictions for this procedure. Please do NOT wear cologne, perfume, aftershave, or lotions (deodorant is allowed). Please arrive 15 minutes prior to your appointment time.  Your physician has requested that you have a lexiscan myoview. For further information please visit [email or web address] Please follow instruction sheet, as given.   Follow-Up: Your physician recommends that you schedule a follow-up appointment in: 6 months  Any Other Special Instructions Will Be Listed Below (If Applicable).  If you need a refill on your cardiac medications before your next appointment, please call your pharmacy.

## 2022-09-24 LAB — LIPID PANEL
Chol/HDL Ratio: 4 ratio (ref 0.0–5.0)
Cholesterol, Total: 156 mg/dL (ref 100–199)
HDL: 39 mg/dL — ABNORMAL LOW (ref >39)
LDL Chol Calc (NIH): 87 mg/dL (ref 0–99)
Triglycerides: 176 mg/dL — ABNORMAL HIGH (ref 0–149)
VLDL Cholesterol Cal: 30 mg/dL (ref 5–40)

## 2022-09-24 NOTE — Telephone Encounter (Signed)
Called the patient. He resides in a resident and Kim answered. She was able to take the normal MRI brain results. She had no questions.

## 2022-10-16 ENCOUNTER — Telehealth: Payer: Self-pay

## 2022-10-16 DIAGNOSIS — E785 Hyperlipidemia, unspecified: Secondary | ICD-10-CM

## 2022-10-16 DIAGNOSIS — Z79899 Other long term (current) drug therapy: Secondary | ICD-10-CM

## 2022-10-16 MED ORDER — ICOSAPENT ETHYL 1 G PO CAPS: 1.0000 g | ORAL_CAPSULE | Freq: Two times a day (BID) | ORAL | 2 refills | Status: DC

## 2022-10-16 NOTE — Telephone Encounter (Signed)
I spoke with Selena Batten at [facility] and discussed lab result and medication changes,e-scribed rx to [facility].I will mail lab slip to them and fax results to [phone]

## 2022-10-16 NOTE — Telephone Encounter (Signed)
-----   Message from Vishnu P Mallipeddi sent at 09/30/2022 10:02 AM EDT ----- Repeat lipid panel showed mildly elevated TG 176 (down from 200s). Discontinue fenofibrate, fish oil supplements and start Vascepa 1g BID.  Repeat lipid panel in 6 months prior to clinic visit.

## 2022-10-20 ENCOUNTER — Ambulatory Visit (HOSPITAL_COMMUNITY)
Admission: RE | Admit: 2022-10-20 | Discharge: 2022-10-20 | Disposition: A | Discharge status: Home / Self Care | Payer: 59 | Source: Ambulatory Visit | Attending: Internal Medicine | Admitting: Internal Medicine

## 2022-10-20 ENCOUNTER — Encounter (HOSPITAL_BASED_OUTPATIENT_CLINIC_OR_DEPARTMENT_OTHER)
Admission: RE | Admit: 2022-10-20 | Discharge: 2022-10-20 | Disposition: A | Discharge status: Home / Self Care | Payer: 59 | Source: Ambulatory Visit | Attending: Internal Medicine | Admitting: Internal Medicine

## 2022-10-20 DIAGNOSIS — R0609 Other forms of dyspnea: Secondary | ICD-10-CM | POA: Diagnosis not present

## 2022-10-20 LAB — NM MYOCAR MULTI W/SPECT W/WALL MOTION / EF
LV dias vol: 48 mL (ref 62–150)
LV sys vol: 10 mL
Nuc Stress EF: 80 %
Peak HR: 98 {beats}/min
RATE: 0.1
Rest HR: 77 {beats}/min
Rest Nuclear Isotope Dose: 11 mCi
SDS: 3
SRS: 0
SSS: 3
ST Depression (mm): 0 mm
Stress Nuclear Isotope Dose: 31.2 mCi
TID: 0.96

## 2022-10-20 LAB — ECHOCARDIOGRAM COMPLETE
Area-P 1/2: 3.62 cm2
S' Lateral: 2.2 cm

## 2022-10-20 MED ORDER — TECHNETIUM TC 99M TETROFOSMIN IV KIT
11.0000 | PACK | Freq: Once | INTRAVENOUS | Status: AC | PRN
  Administered 2022-10-20: 11 via INTRAVENOUS

## 2022-10-20 MED ORDER — TECHNETIUM TC 99M TETROFOSMIN IV KIT
31.2000 | PACK | Freq: Once | INTRAVENOUS | Status: AC | PRN
  Administered 2022-10-20: 31.2 via INTRAVENOUS

## 2022-10-20 MED ORDER — SODIUM CHLORIDE FLUSH 0.9 % IV SOLN
INTRAVENOUS | Status: AC
  Administered 2022-10-20: 10 mL via INTRAVENOUS
  Filled 2022-10-20: qty 10

## 2022-10-20 MED ORDER — REGADENOSON 0.4 MG/5ML IV SOLN
INTRAVENOUS | Status: AC
  Administered 2022-10-20: 0.4 mg via INTRAVENOUS
  Filled 2022-10-20: qty 5

## 2022-10-20 NOTE — Progress Notes (Signed)
*  PRELIMINARY RESULTS* Echocardiogram 2D Echocardiogram has been performed.  Stacey Drain 10/20/2022, 10:27 AM

## 2022-10-21 ENCOUNTER — Telehealth: Payer: Self-pay

## 2022-10-21 NOTE — Telephone Encounter (Signed)
Kim notified at [facility]. Verbalized understanding.

## 2022-10-21 NOTE — Telephone Encounter (Signed)
-----   Message from Vishnu P Mallipeddi sent at 10/21/2022  8:20 AM EDT ----- Normal heart pumping function, no valvular heart disease.  Normal echo.

## 2022-10-21 NOTE — Telephone Encounter (Signed)
-----   Message from Shawn Baird sent at 10/21/2022  8:20 AM EDT ----- Normal stress test.

## 2022-10-29 ENCOUNTER — Other Ambulatory Visit: Payer: Self-pay | Admitting: Gastroenterology

## 2022-10-29 DIAGNOSIS — K5909 Other constipation: Secondary | ICD-10-CM

## 2023-02-02 ENCOUNTER — Encounter (HOSPITAL_COMMUNITY): Payer: Self-pay

## 2023-02-02 ENCOUNTER — Emergency Department (HOSPITAL_COMMUNITY)
Admission: EM | Admit: 2023-02-02 | Discharge: 2023-02-03 | Disposition: A | Discharge status: Skilled Nursing Facility | Payer: 59 | Attending: Emergency Medicine | Admitting: Emergency Medicine

## 2023-02-02 ENCOUNTER — Other Ambulatory Visit: Payer: Self-pay

## 2023-02-02 ENCOUNTER — Emergency Department (HOSPITAL_COMMUNITY): Payer: 59

## 2023-02-02 DIAGNOSIS — Z859 Personal history of malignant neoplasm, unspecified: Secondary | ICD-10-CM | POA: Insufficient documentation

## 2023-02-02 DIAGNOSIS — E119 Type 2 diabetes mellitus without complications: Secondary | ICD-10-CM | POA: Insufficient documentation

## 2023-02-02 DIAGNOSIS — Z7982 Long term (current) use of aspirin: Secondary | ICD-10-CM | POA: Diagnosis not present

## 2023-02-02 DIAGNOSIS — R197 Diarrhea, unspecified: Secondary | ICD-10-CM | POA: Insufficient documentation

## 2023-02-02 DIAGNOSIS — R112 Nausea with vomiting, unspecified: Secondary | ICD-10-CM | POA: Diagnosis present

## 2023-02-02 DIAGNOSIS — I10 Essential (primary) hypertension: Secondary | ICD-10-CM | POA: Diagnosis not present

## 2023-02-02 DIAGNOSIS — R109 Unspecified abdominal pain: Secondary | ICD-10-CM | POA: Insufficient documentation

## 2023-02-02 DIAGNOSIS — Z79899 Other long term (current) drug therapy: Secondary | ICD-10-CM | POA: Insufficient documentation

## 2023-02-02 DIAGNOSIS — Z7984 Long term (current) use of oral hypoglycemic drugs: Secondary | ICD-10-CM | POA: Diagnosis not present

## 2023-02-02 LAB — CBC WITH DIFFERENTIAL/PLATELET
Abs Immature Granulocytes: 0.02 10*3/uL (ref 0.00–0.07)
Basophils Absolute: 0 10*3/uL (ref 0.0–0.1)
Basophils Relative: 1 %
Eosinophils Absolute: 0.2 10*3/uL (ref 0.0–0.5)
Eosinophils Relative: 3 %
HCT: 42.7 % (ref 39.0–52.0)
Hemoglobin: 14.4 g/dL (ref 13.0–17.0)
Immature Granulocytes: 0 %
Lymphocytes Relative: 23 %
Lymphs Abs: 1.2 10*3/uL (ref 0.7–4.0)
MCH: 29 pg (ref 26.0–34.0)
MCHC: 33.7 g/dL (ref 30.0–36.0)
MCV: 85.9 fL (ref 80.0–100.0)
Monocytes Absolute: 0.7 10*3/uL (ref 0.1–1.0)
Monocytes Relative: 13 %
Neutro Abs: 3.2 10*3/uL (ref 1.7–7.7)
Neutrophils Relative %: 60 %
Platelets: 276 10*3/uL (ref 150–400)
RBC: 4.97 MIL/uL (ref 4.22–5.81)
RDW: 13.7 % (ref 11.5–15.5)
WBC: 5.3 10*3/uL (ref 4.0–10.5)
nRBC: 0 % (ref 0.0–0.2)

## 2023-02-02 LAB — COMPREHENSIVE METABOLIC PANEL
ALT: 16 U/L (ref 0–44)
AST: 20 U/L (ref 15–41)
Albumin: 4 g/dL (ref 3.5–5.0)
Alkaline Phosphatase: 76 U/L (ref 38–126)
Anion gap: 11 (ref 5–15)
BUN: 16 mg/dL (ref 8–23)
CO2: 21 mmol/L — ABNORMAL LOW (ref 22–32)
Calcium: 9.4 mg/dL (ref 8.9–10.3)
Chloride: 105 mmol/L (ref 98–111)
Creatinine, Ser: 1.29 mg/dL — ABNORMAL HIGH (ref 0.61–1.24)
GFR, Estimated: >60 mL/min (ref >60)
Glucose, Bld: 158 mg/dL — ABNORMAL HIGH (ref 70–99)
Potassium: 3.7 mmol/L (ref 3.5–5.1)
Sodium: 137 mmol/L (ref 135–145)
Total Bilirubin: 1 mg/dL (ref 0.0–1.2)
Total Protein: 7.8 g/dL (ref 6.5–8.1)

## 2023-02-02 LAB — LIPASE, BLOOD: Lipase: 47 U/L (ref 11–51)

## 2023-02-02 MED ORDER — SODIUM CHLORIDE 0.9 % IV BOLUS
1000.0000 mL | Freq: Once | INTRAVENOUS | Status: AC
  Administered 2023-02-02: 1000 mL via INTRAVENOUS

## 2023-02-02 MED ORDER — ONDANSETRON HCL 4 MG PO TABS: 4.0000 mg | ORAL_TABLET | Freq: Four times a day (QID) | ORAL | 0 refills | Status: DC

## 2023-02-02 MED ORDER — ONDANSETRON HCL 4 MG/2ML IJ SOLN
4.0000 mg | Freq: Once | INTRAMUSCULAR | Status: AC
  Administered 2023-02-02: 4 mg via INTRAVENOUS
  Filled 2023-02-02: qty 2

## 2023-02-02 MED ORDER — IOHEXOL 300 MG/ML  SOLN
100.0000 mL | Freq: Once | INTRAMUSCULAR | Status: AC | PRN
  Administered 2023-02-02: 100 mL via INTRAVENOUS

## 2023-02-02 NOTE — ED Notes (Signed)
 Patient transported to CT via stretcher.

## 2023-02-02 NOTE — ED Notes (Signed)
 Patient returned from CT via stretcher.

## 2023-02-02 NOTE — ED Notes (Signed)
 Notified [facility] of patient needing transportation back to [facility].

## 2023-02-02 NOTE — Discharge Instructions (Signed)
 Workup this evening was reassuring.  Likely have a stomach virus.  Recommend frequent small sips of clear fluids, bland diet as tolerated.  You have been prescribed medication for nausea vomiting.  Follow-up with your primary care provider for recheck if needed.

## 2023-02-02 NOTE — ED Provider Notes (Signed)
 [facility] DEPARTMENT AT [facility] Provider Note   CSN: [id number] Arrival date & time: 02/02/23  1807     History  Chief Complaint  Patient presents with   Emesis    Shawn Baird is a 63 y.o. male.   Emesis      Shawn Baird is a 63 y.o. male with past medical history of schizoaffective disorder, pancreatitis, cancer, and Tourette's, cognitive impairment, hypertension, type 2 diabetes and he is deaf.  He resides at [facility] rest home, brought in for evaluation for vomiting.  Patient has cognitive impairment with Tourette's and he is deaf.  Unable to provide relevant medical history.  Several screening questions were written down but patient answers yes to all of them, even nonrelevant questions.  Spoke with caregiver at the facility, patient was sent here for evaluation of vomiting since yesterday.  Caregiver also endorses some loose stools as well.  No recent antibiotics, no reported fever at the facility.   Home Medications Prior to Admission medications   Medication Sig Start Date End Date Taking? Authorizing Provider  amLODipine  (NORVASC ) 5 MG tablet Take 1 tablet (5 mg total) by mouth every morning. 10/14/13   Jacquetta Sharlot GRADE, NP  aspirin  EC 81 MG tablet Take 1 tablet (81 mg total) by mouth every morning. 10/14/13   Jacquetta Sharlot GRADE, NP  atorvastatin  (LIPITOR) 40 MG tablet Take 1 tablet (40 mg total) by mouth daily. 09/01/22   Johnson, Clanford L, MD  divalproex  (DEPAKOTE  ER) 500 MG 24 hr tablet Take 2 tablets (1,000 mg total) by mouth at bedtime. 08/29/22   Johnson, Clanford L, MD  ezetimibe  (ZETIA ) 10 MG tablet Take 1 tablet (10 mg total) by mouth daily. 09/01/22   Johnson, Clanford L, MD  haloperidol  (HALDOL ) 5 MG tablet Take 5 mg by mouth 2 (two) times daily.    [provider]  icosapent  Ethyl (VASCEPA ) 1 g capsule Take 1 capsule (1 g total) by mouth 2 (two) times daily. 10/16/22   Mallipeddi, Vishnu P, MD  levothyroxine  (SYNTHROID ) 25 MCG  tablet Take 25 mcg by mouth daily before breakfast. 05/12/22   [provider]  LINZESS  290 MCG CAPS capsule Take 1 capsule (290 mcg total) by mouth daily before breakfast. 10/30/22   Rudy Josette RAMAN, PA-C  loratadine  (CLARITIN ) 10 MG tablet Take 10 mg by mouth at bedtime.    [provider]  metFORMIN  (GLUCOPHAGE ) 500 MG tablet Take 1 tablet (500 mg total) by mouth 2 (two) times daily. 08/31/22   Johnson, Clanford L, MD  Methylcellulose, Laxative, (FIBER THERAPY PO) Take 1 Scoop by mouth See admin instructions. 1 tablespoon in a full glass of water  by mouth 2 times weekly on Tuesday and Thursday.    [provider]  metoprolol  succinate (TOPROL -XL) 25 MG 24 hr tablet Take 12.5 mg by mouth daily. 05/12/22   [provider]  omeprazole  (PRILOSEC) 20 MG capsule Take 1 capsule (20 mg total) by mouth every morning. 10/14/13   Jacquetta Sharlot GRADE, NP  polyethylene glycol (MIRALAX  / GLYCOLAX ) 17 g packet mix 17 grams in 8 oz of water  or juice and take BY MOUTH TWICE DAILY FOR constipation 09/14/22   Harper, Kristen S, PA-C  polyethylene glycol powder (GLYCOLAX /MIRALAX ) 17 GM/SCOOP powder Take 17 g by mouth 2 (two) times daily. 05/20/22   Rudy Josette RAMAN, PA-C  QUEtiapine  (SEROQUEL  XR) 400 MG 24 hr tablet Take 1 tablet (400 mg total) by mouth at bedtime. 08/29/22  Johnson, Clanford L, MD  traZODone  (DESYREL ) 100 MG tablet Take 100 mg by mouth at bedtime.    [provider]  Vitamin D , Ergocalciferol , (DRISDOL ) 1.25 MG (50000 UNIT) CAPS capsule Take 1 capsule (50,000 Units total) by mouth every Sunday. 08/30/22   Johnson, Clanford L, MD  Zinc 50 MG TABS Take 50 mg by mouth daily.    [provider]      Allergies    Patient has no known allergies.    Review of Systems   Review of Systems  Unable to perform ROS: Patient nonverbal    Physical Exam Updated Vital Signs BP 117/85 (BP Location: Left Arm)   Pulse 100   Temp 99.2 F (37.3 C) (Oral)   Resp 20    Ht 5' 4 (1.626 m)   Wt 73 kg   SpO2 96%   BMI 27.62 kg/m  Physical Exam Vitals and nursing note reviewed.  Constitutional:      General: He is not in acute distress.    Appearance: Normal appearance.  HENT:     Mouth/Throat:     Mouth: Mucous membranes are moist.  Cardiovascular:     Rate and Rhythm: Normal rate and regular rhythm.     Pulses: Normal pulses.  Pulmonary:     Effort: Pulmonary effort is normal.  Abdominal:     General: There is no distension.     Palpations: Abdomen is soft.     Tenderness: There is no abdominal tenderness.  Musculoskeletal:     Right lower leg: No edema.     Left lower leg: No edema.  Skin:    Capillary Refill: Capillary refill takes less than 2 seconds.  Neurological:     Mental Status: He is alert.     Sensory: No sensory deficit.     Motor: No weakness.     ED Results / Procedures / Treatments   Labs (all labs ordered are listed, but only abnormal results are displayed) Labs Reviewed  COMPREHENSIVE METABOLIC PANEL - Abnormal; Notable for the following components:      Result Value   CO2 21 (*)    Glucose, Bld 158 (*)    Creatinine, Ser 1.29 (*)    All other components within normal limits  CBC WITH DIFFERENTIAL/PLATELET  LIPASE, BLOOD  URINALYSIS, ROUTINE W REFLEX MICROSCOPIC    EKG None  Radiology CT ABDOMEN PELVIS W CONTRAST Result Date: 02/02/2023 CLINICAL DATA:  Abdominal pain, vomiting EXAM: CT ABDOMEN AND PELVIS WITH CONTRAST TECHNIQUE: Multidetector CT imaging of the abdomen and pelvis was performed using the standard protocol following bolus administration of intravenous contrast. RADIATION DOSE REDUCTION: This exam was performed according to the departmental dose-optimization program which includes automated exposure control, adjustment of the mA and/or kV according to patient size and/or use of iterative reconstruction technique. CONTRAST:  OMNIPAQUE  IOHEXOL  300 MG/ML  SOLN COMPARISON:  03/17/2022 FINDINGS:  Lower chest: No acute abnormality Hepatobiliary: No focal hepatic abnormality. Gallbladder unremarkable. Pancreas: No focal abnormality or ductal dilatation. Spleen: No focal abnormality.  Normal size. Adrenals/Urinary Tract: Numerous bilateral subcentimeter renal cysts. No follow-up imaging recommended. No stones or hydronephrosis. Adrenal glands and urinary bladder unremarkable. Stomach/Bowel: Sigmoid diverticulosis. No active diverticulitis. Stomach and small bowel decompressed, unremarkable. Vascular/Lymphatic: Aortic atherosclerosis. No evidence of aneurysm or adenopathy. Reproductive: No visible focal abnormality. Other: No free fluid or free air. Musculoskeletal: No acute bony abnormality. IMPRESSION: Sigmoid diverticulosis.  No active diverticulitis. Aortic atherosclerosis. No acute findings. Electronically Signed  By: Franky Crease M.D.   On: 02/02/2023 20:14    Procedures Procedures    Medications Ordered in ED Medications  sodium chloride  0.9 % bolus 1,000 mL (1,000 mLs Intravenous New Bag/Given 02/02/23 1900)  ondansetron  (ZOFRAN ) injection 4 mg (4 mg Intravenous Given 02/02/23 1900)  iohexol  (OMNIPAQUE ) 300 MG/ML solution 100 mL (100 mLs Intravenous Contrast Given 02/02/23 1950)    ED Course/ Medical Decision Making/ A&P                                 Medical Decision Making Patient brought in from local assisted living facility for evaluation of vomiting.  Symptoms began yesterday.  Is also noted to have some diarrhea.  No reported fever or recent antibiotics.  Emesis bag at bedside with saliva, no emesis  On exam, patient nontoxic-appearing.  Vital signs are reassuring.  Abdomen is soft without guarding or rebound.  Differential includes but not limited to gastroenteritis, recurrent pancreatitis, acute surgical abdomen also considered but felt less likely.  Will check labs, antiemetic plus or minus CT abdomen and pelvis  Amount and/or Complexity of Data Reviewed Labs: ordered.     Details: Labs without significant derangement Radiology: ordered.    Details: CT abdomen and pelvis without acute intra-abdominal process Discussion of management or test interpretation with external provider(s): Patient with suspected gastroenteritis.  No active vomiting during ER stay.  Was given IVF's, antiemetic.  Workup overall reassuring.  Has tolerated p.o. challenge, felt appropriate to return back to facility.  Prescription written for Zofran   Risk Prescription drug management.           Final Clinical Impression(s) / ED Diagnoses Final diagnoses:  Nausea vomiting and diarrhea    Rx / DC Orders ED Discharge Orders     None         Herlinda Madelin RIGGERS 02/02/23 2307    Yolande Lamar BROCKS, MD 02/09/23 1147

## 2023-02-02 NOTE — ED Notes (Signed)
 Pt successfully drank approximately 60 cc of iced water without emesis or choking.

## 2023-02-02 NOTE — ED Triage Notes (Signed)
 Pt complains of vomiting x3 days. EMS reports CBG 160. Pt is deaf. Pt has temp of 99.2 on arrival.

## 2023-02-02 NOTE — ED Triage Notes (Signed)
Pt from [facility] 

## 2023-02-22 ENCOUNTER — Telehealth: Payer: Self-pay

## 2023-02-22 ENCOUNTER — Other Ambulatory Visit (HOSPITAL_COMMUNITY): Payer: Self-pay

## 2023-02-22 NOTE — Telephone Encounter (Signed)
Pharmacy Patient Advocate Encounter   Received notification from CoverMyMeds that prior authorization for VASCEPA is required/requested.   Insurance verification completed.   The patient is insured through [facility] .   Per test claim: Refill too soon. PA is not needed at this time. Medication was filled 02/09/23. Next eligible fill date is 03/04/23.

## 2023-03-30 ENCOUNTER — Ambulatory Visit: Payer: 59 | Admitting: Internal Medicine

## 2023-04-09 ENCOUNTER — Encounter: Payer: Self-pay | Admitting: Internal Medicine

## 2023-04-09 ENCOUNTER — Ambulatory Visit: Payer: 59 | Attending: Internal Medicine | Admitting: Internal Medicine

## 2023-04-09 VITALS — BP 120/78 | HR 78 | Ht 64.0 in | Wt 160.4 lb

## 2023-04-09 DIAGNOSIS — I10 Essential (primary) hypertension: Secondary | ICD-10-CM

## 2023-04-09 NOTE — Progress Notes (Signed)
 Cardiology Office Note  Date: 04/09/2023   ID: Shawn Baird, DOB 06-13-1960, MRN [id number]  PCP:  Christene Lye, FNP  Cardiologist:  Marjo Bicker, MD Electrophysiologist:  None   History of Present Illness: Shawn Baird is a 63 y.o. male known to have HTN, DM 2, HLD, seizure affective disorder, Tourette's disease, mental retardation is here for follow-up visit.  Patient lives in a facility. Patient is hard of hearing and whispers when he talks.  His sister is next of kin.  But he can provide consent.  He was initially referred to cardiology clinic for evaluation of DOE x 3 to 4 months.  This history was obtained from the caretaker but not from the patient as patient does not complain much about anything.  Echocardiogram was performed that showed normal LVEF, no valvular heart disease.  NM stress test was also performed in September 2024 that showed no evidence of ischemia.  He is doing great now.  DOE resolved.  No angina, dizziness, presyncope, syncope.  No leg swelling.  He does not walk, wheelchair dependent.  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     aspirin EC 81 MG tablet Take 1 tablet (81 mg total) by mouth every morning.     atorvastatin (LIPITOR) 40 MG tablet Take 1 tablet (40 mg total) by mouth daily.     divalproex (DEPAKOTE ER) 500 MG 24 hr tablet Take 2 tablets (1,000 mg total) by mouth at bedtime.     ezetimibe (ZETIA) 10 MG tablet Take 1 tablet (10 mg total) by mouth daily.     haloperidol (HALDOL) 5 MG tablet Take 5 mg by mouth 2 (two) times daily.     icosapent Ethyl (VASCEPA) 1 g capsule Take 1 capsule (1 g total) by mouth 2 (two) times daily. 180 capsule 2   levothyroxine (SYNTHROID) 25 MCG tablet Take 25 mcg by mouth daily before breakfast.     LINZESS 290 MCG CAPS capsule Take 1 capsule (290 mcg total) by mouth daily before breakfast. 30 capsule 5   loratadine (CLARITIN) 10 MG tablet Take 10 mg by mouth at bedtime.     metFORMIN (GLUCOPHAGE) 500 MG tablet Take 1 tablet (500 mg total) by mouth 2 (two) times daily.     Methylcellulose, Laxative, (FIBER THERAPY PO) Take 1 Scoop by mouth See admin instructions. 1 tablespoon in a full glass of water by mouth 2 times  weekly on Tuesday and Thursday.     metoprolol succinate (TOPROL-XL) 25 MG 24 hr tablet Take 12.5 mg by mouth daily.     omeprazole (PRILOSEC) 20 MG capsule Take 1 capsule (20 mg total) by mouth every morning.     ondansetron (ZOFRAN) 4 MG tablet Take 1 tablet (4 mg total) by mouth every 6 (six) hours. As needed for nausea and vomiting 15 tablet 0   polyethylene glycol (MIRALAX / GLYCOLAX) 17 g packet mix 17 grams in 8 oz of water or juice and take BY MOUTH TWICE DAILY FOR constipation 60 packet 5   polyethylene glycol powder (GLYCOLAX/MIRALAX) 17 GM/SCOOP powder Take 17 g by mouth 2 (two) times daily. 850 g 3   QUEtiapine (SEROQUEL XR) 400 MG 24 hr tablet Take 1 tablet (400 mg total) by mouth at bedtime. 30 tablet 0   traZODone (DESYREL) 100 MG tablet Take 100 mg by mouth at bedtime.      Vitamin D, Ergocalciferol, (DRISDOL) 1.25 MG (50000 UNIT) CAPS capsule Take 1 capsule (50,000 Units total) by mouth every Sunday.     Zinc 50 MG TABS Take 50 mg by mouth daily.     No current facility-administered medications for this visit.   Allergies:  Patient has no known allergies.   Social History: The patient  reports that he has never smoked. He has never used smokeless tobacco. He reports that he does not drink alcohol and does not use drugs.   Family History: The patient's family history includes Heart disease in his mother; High Cholesterol in his sister; High blood pressure in his sister; Stroke in his mother.   ROS:  Please see the history of present illness. Otherwise, complete review of systems is positive for none  All other systems are reviewed and negative.   Physical Exam: VS:  BP 120/78   Pulse 78   Ht 5\' 4"  (1.626 m)   Wt 160 lb 6.4 oz (72.8 kg)   SpO2 95%   BMI 27.53 kg/m , BMI Body mass index is 27.53 kg/m.  Wt Readings from Last 3 Encounters:  04/09/23 160 lb 6.4 oz (72.8 kg)  02/02/23 160 lb 15 oz (73 kg)  09/23/22 161 lb (73 kg)    General: Patient appears comfortable at rest. HEENT: Conjunctiva and lids normal, oropharynx clear with moist mucosa. Neck: Supple, no elevated JVP or carotid bruits, no thyromegaly. Lungs: Clear to auscultation, nonlabored breathing at rest. Cardiac: Regular rate and rhythm, no S3 or significant systolic murmur, no pericardial rub. Abdomen: Soft, nontender, no hepatomegaly, bowel sounds present, no guarding or rebound. Extremities: No pitting edema, distal pulses 2+. Skin: Warm and dry. Musculoskeletal: No kyphosis. Neuropsychiatric: Alert and oriented x3, affect grossly appropriate.  Recent Labwork: 08/27/2022: TSH 5.154 08/29/2022: Magnesium 1.8 02/02/2023: ALT 16; AST 20; BUN 16; Creatinine, Ser 1.29; Hemoglobin 14.4; Platelets 276; Potassium 3.7; Sodium 137     Component Value Date/Time   CHOL 156 09/23/2022 1529    TRIG 176 (H) 09/23/2022 1529   HDL 39 (L) 09/23/2022 1529   CHOLHDL 4.0 09/23/2022 1529   LDLCALC 87 09/23/2022 1529      Assessment and Plan:   DOE,, resolved: Echocardiogram and stress test in September 2024 are unremarkable.  HLD, hypertriglyceridemia: I reviewed the lipid panel from 05/2021 that showed elevated triglycerides 261 and elevated LDL of 170.  Lipid panel was not performed.  He can follow with PCP for the management of hyperlipidemia.  Continue atorvastatin 40 mg nightly, Zetia  10 mg once daily, Vascepa 1 g twice daily.      Medication Adjustments/Labs and Tests Ordered: Current medicines are reviewed at length with the patient today.  Concerns regarding medicines are outlined above.    Disposition:  Follow up as needed  Signed Maleeah Crossman Verne Spurr, MD, 04/09/2023 10:32 AM    [facility] at [facility] [address] [place], [place], [place] [address]

## 2023-04-09 NOTE — Patient Instructions (Signed)
Medication Instructions:  Your physician recommends that you continue on your current medications as directed. Please refer to the Current Medication list given to you today.  *If you need a refill on your cardiac medications before your next appointment, please call your pharmacy*   Lab Work: None If you have labs (blood work) drawn today and your tests are completely normal, you will receive your results only by: MyChart Message (if you have MyChart) OR A paper copy in the mail If you have any lab test that is abnormal or we need to change your treatment, we will call you to review the results.   Testing/Procedures: None   Follow-Up: At [facility], you and your health needs are our priority.  As part of our continuing mission to provide you with exceptional heart care, we have created designated Provider Care Teams.  These Care Teams include your primary Cardiologist (physician) and Advanced Practice Providers (APPs -  Physician Assistants and Nurse Practitioners) who all work together to provide you with the care you need, when you need it.  We recommend signing up for the patient portal called "MyChart".  Sign up information is provided on this After Visit Summary.  MyChart is used to connect with patients for Virtual Visits (Telemedicine).  Patients are able to view lab/test results, encounter notes, upcoming appointments, etc.  Non-urgent messages can be sent to your provider as well.   To learn more about what you can do with MyChart, go to [email or web address].    Your next appointment:    Follow up as needed.   Provider:   You may see Vishnu P Mallipeddi, MD or one of the following Advanced Practice Providers on your designated Care Team:   [place], PA-C  Jacolyn Reedy, [place]     Other Instructions

## 2023-05-03 ENCOUNTER — Other Ambulatory Visit: Payer: Self-pay | Admitting: Gastroenterology

## 2023-05-03 DIAGNOSIS — K5909 Other constipation: Secondary | ICD-10-CM

## 2023-05-18 ENCOUNTER — Emergency Department (HOSPITAL_COMMUNITY)

## 2023-05-18 ENCOUNTER — Emergency Department (HOSPITAL_COMMUNITY): Admission: EM | Admit: 2023-05-18 | Discharge: 2023-05-19 | Disposition: A | Discharge status: Skilled Nursing Facility

## 2023-05-18 ENCOUNTER — Other Ambulatory Visit: Payer: Self-pay

## 2023-05-18 ENCOUNTER — Encounter (HOSPITAL_COMMUNITY): Payer: Self-pay

## 2023-05-18 DIAGNOSIS — Z7982 Long term (current) use of aspirin: Secondary | ICD-10-CM | POA: Insufficient documentation

## 2023-05-18 DIAGNOSIS — Z7984 Long term (current) use of oral hypoglycemic drugs: Secondary | ICD-10-CM | POA: Insufficient documentation

## 2023-05-18 DIAGNOSIS — H6690 Otitis media, unspecified, unspecified ear: Secondary | ICD-10-CM | POA: Diagnosis not present

## 2023-05-18 DIAGNOSIS — R4182 Altered mental status, unspecified: Secondary | ICD-10-CM | POA: Insufficient documentation

## 2023-05-18 DIAGNOSIS — E119 Type 2 diabetes mellitus without complications: Secondary | ICD-10-CM | POA: Insufficient documentation

## 2023-05-18 DIAGNOSIS — I10 Essential (primary) hypertension: Secondary | ICD-10-CM | POA: Insufficient documentation

## 2023-05-18 DIAGNOSIS — Z79899 Other long term (current) drug therapy: Secondary | ICD-10-CM | POA: Insufficient documentation

## 2023-05-18 LAB — CBC WITH DIFFERENTIAL/PLATELET
Abs Immature Granulocytes: 0.02 10*3/uL (ref 0.00–0.07)
Basophils Absolute: 0 10*3/uL (ref 0.0–0.1)
Basophils Relative: 1 %
Eosinophils Absolute: 0.6 10*3/uL — ABNORMAL HIGH (ref 0.0–0.5)
Eosinophils Relative: 12 %
HCT: 42.5 % (ref 39.0–52.0)
Hemoglobin: 14 g/dL (ref 13.0–17.0)
Immature Granulocytes: 0 %
Lymphocytes Relative: 31 %
Lymphs Abs: 1.5 10*3/uL (ref 0.7–4.0)
MCH: 29 pg (ref 26.0–34.0)
MCHC: 32.9 g/dL (ref 30.0–36.0)
MCV: 88 fL (ref 80.0–100.0)
Monocytes Absolute: 0.5 10*3/uL (ref 0.1–1.0)
Monocytes Relative: 11 %
Neutro Abs: 2.2 10*3/uL (ref 1.7–7.7)
Neutrophils Relative %: 45 %
Platelets: 203 10*3/uL (ref 150–400)
RBC: 4.83 MIL/uL (ref 4.22–5.81)
RDW: 13.5 % (ref 11.5–15.5)
WBC: 4.7 10*3/uL (ref 4.0–10.5)
nRBC: 0 % (ref 0.0–0.2)

## 2023-05-18 LAB — CK: Total CK: 379 U/L (ref 49–397)

## 2023-05-18 LAB — URINALYSIS, ROUTINE W REFLEX MICROSCOPIC
Bilirubin Urine: NEGATIVE
Glucose, UA: NEGATIVE mg/dL
Hgb urine dipstick: NEGATIVE
Ketones, ur: NEGATIVE mg/dL
Leukocytes,Ua: NEGATIVE
Nitrite: NEGATIVE
Protein, ur: NEGATIVE mg/dL
Specific Gravity, Urine: 1.006 (ref 1.005–1.030)
pH: 5 (ref 5.0–8.0)

## 2023-05-18 LAB — COMPREHENSIVE METABOLIC PANEL WITH GFR
ALT: 12 U/L (ref 0–44)
AST: 22 U/L (ref 15–41)
Albumin: 3.8 g/dL (ref 3.5–5.0)
Alkaline Phosphatase: 67 U/L (ref 38–126)
Anion gap: 13 (ref 5–15)
BUN: 14 mg/dL (ref 8–23)
CO2: 21 mmol/L — ABNORMAL LOW (ref 22–32)
Calcium: 8.9 mg/dL (ref 8.9–10.3)
Chloride: 103 mmol/L (ref 98–111)
Creatinine, Ser: 1.41 mg/dL — ABNORMAL HIGH (ref 0.61–1.24)
GFR, Estimated: 56 mL/min — ABNORMAL LOW (ref >60)
Glucose, Bld: 125 mg/dL — ABNORMAL HIGH (ref 70–99)
Potassium: 3.7 mmol/L (ref 3.5–5.1)
Sodium: 137 mmol/L (ref 135–145)
Total Bilirubin: 0.5 mg/dL (ref 0.0–1.2)
Total Protein: 7.5 g/dL (ref 6.5–8.1)

## 2023-05-18 MED ORDER — LORAZEPAM 2 MG/ML IJ SOLN
1.0000 mg | Freq: Once | INTRAMUSCULAR | Status: AC
  Administered 2023-05-18: 1 mg via INTRAVENOUS
  Filled 2023-05-18: qty 1

## 2023-05-18 MED ORDER — LACTATED RINGERS IV BOLUS
1000.0000 mL | Freq: Once | INTRAVENOUS | Status: AC
  Administered 2023-05-18: 1000 mL via INTRAVENOUS

## 2023-05-18 MED ORDER — AMOXICILLIN-POT CLAVULANATE 875-125 MG PO TABS
1.0000 | ORAL_TABLET | Freq: Once | ORAL | Status: AC
  Administered 2023-05-18: 1 via ORAL
  Filled 2023-05-18: qty 1

## 2023-05-18 MED ORDER — AMOXICILLIN-POT CLAVULANATE 875-125 MG PO TABS: 1.0000 | ORAL_TABLET | Freq: Two times a day (BID) | ORAL | 0 refills | Status: AC

## 2023-05-18 NOTE — ED Triage Notes (Signed)
 Pt BIB ems for altered mental status/ psych evaluation per the facilty. Pt has hx of schizophrenia and staff states that patient has been taking his medications. Pt is nonverbal at baseline but now will swat at staff and yell no. Facility spoke with the provider there and they want the pt worked up to rule out UTI and other labs.

## 2023-05-18 NOTE — Discharge Instructions (Signed)
 Your workup was reassuring.  Your CT scan of your head showed possible ear infection.  Please take the antibiotic as prescribed and follow-up with your doctor.  Return to the ER for worsening symptoms.

## 2023-05-18 NOTE — ED Provider Notes (Signed)
 [place] EMERGENCY DEPARTMENT AT [facility] Provider Note   CSN: [id number] Arrival date & time: 05/18/23  1516     History  Chief Complaint  Patient presents with   Altered Mental Status    Shawn Baird is a 63 y.o. male.  63 year old male with past medical history of schizoaffective disorder and mental retardation as well as diabetes, hypertension, and hyperlipidemia who is mute at baseline presenting to the emergency department today with apparent altered mental status.  The patient has apparently had some intermittent agitation at his nursing facility.  He was brought to the emergency department today for further evaluation after being sent in for this.   Altered Mental Status      Home Medications Prior to Admission medications   Medication Sig Start Date End Date Taking? Authorizing Provider  amLODipine  (NORVASC ) 5 MG tablet Take 1 tablet (5 mg total) by mouth every morning. 10/14/13  Yes Lissa Riding, NP  amoxicillin -clavulanate (AUGMENTIN ) 875-125 MG tablet Take 1 tablet by mouth every 12 (twelve) hours. 05/18/23  Yes Carin Charleston, MD  aspirin  EC 81 MG tablet Take 1 tablet (81 mg total) by mouth every morning. 10/14/13  Yes Lord, Wendelin Halsted, NP  atorvastatin  (LIPITOR) 40 MG tablet Take 1 tablet (40 mg total) by mouth daily. 09/01/22  Yes Johnson, Clanford L, MD  divalproex  (DEPAKOTE  ER) 500 MG 24 hr tablet Take 2 tablets (1,000 mg total) by mouth at bedtime. 08/29/22  Yes Johnson, Clanford L, MD  ezetimibe  (ZETIA ) 10 MG tablet Take 1 tablet (10 mg total) by mouth daily. 09/01/22  Yes Johnson, Clanford L, MD  haloperidol  (HALDOL ) 5 MG tablet Take 5 mg by mouth 2 (two) times daily.   Yes [provider]  icosapent  Ethyl (VASCEPA ) 1 g capsule Take 1 capsule (1 g total) by mouth 2 (two) times daily. 10/16/22  Yes Mallipeddi, Vishnu P, MD  levothyroxine  (SYNTHROID ) 25 MCG tablet Take 25 mcg by mouth daily before breakfast. 05/12/22  Yes [provider]   LINZESS  290 MCG CAPS capsule Take 1 capsule (290 mcg total) by mouth daily before breakfast. 05/03/23  Yes April Knack, Kristen S, PA-C  loratadine  (CLARITIN ) 10 MG tablet Take 10 mg by mouth at bedtime.   Yes [provider]  metFORMIN  (GLUCOPHAGE ) 500 MG tablet Take 1 tablet (500 mg total) by mouth 2 (two) times daily. 08/31/22  Yes Johnson, Clanford L, MD  Methylcellulose, Laxative, (FIBER THERAPY PO) Take 1 Scoop by mouth See admin instructions. 1 tablespoon in a full glass of water  by mouth 2 times weekly on Tuesday and Thursday.   Yes [provider]  metoprolol  succinate (TOPROL -XL) 25 MG 24 hr tablet Take 12.5 mg by mouth daily. 05/12/22  Yes [provider]  omeprazole  (PRILOSEC) 20 MG capsule Take 1 capsule (20 mg total) by mouth every morning. 10/14/13  Yes Lissa Riding, NP  polyethylene glycol powder (GLYCOLAX /MIRALAX ) 17 GM/SCOOP powder Take 17 g by mouth 2 (two) times daily. Patient taking differently: Take 17 g by mouth every other day. 05/20/22  Yes Evander Hills, PA-C  QUEtiapine  (SEROQUEL  XR) 400 MG 24 hr tablet Take 1 tablet (400 mg total) by mouth at bedtime. 08/29/22  Yes Johnson, Clanford L, MD  traZODone  (DESYREL ) 100 MG tablet Take 200 mg by mouth at bedtime.   Yes [provider]  Vitamin D , Ergocalciferol , (DRISDOL ) 1.25 MG (50000 UNIT) CAPS capsule Take 1 capsule (50,000 Units total) by mouth every Sunday. 08/30/22  Yes Johnson, Clanford L, MD  Zinc 50 MG TABS Take 50 mg by mouth daily.   Yes [provider]      Allergies    Patient has no known allergies.    Review of Systems   Review of Systems  Reason unable to perform ROS: Mute at baseline, schizoaffective.    Physical Exam Updated Vital Signs BP (!) 154/86 (BP Location: Left Arm)   Pulse 64   Temp 98 F (36.7 C) (Oral)   Resp 15   Ht 5\' 4"  (1.626 m)   Wt 72.8 kg   SpO2 97%   BMI 27.55 kg/m  Physical Exam Vitals and nursing note reviewed.   Gen: NAD Eyes:  PERRL, EOMI HEENT: no oropharyngeal swelling Neck: trachea midline Resp: clear to auscultation bilaterally Card: RRR, no murmurs, rubs, or gallops Abd: nontender, nondistended Extremities: no calf tenderness, no edema Vascular: 2+ radial pulses bilaterally, 2+ DP pulses bilaterally Neuro: will not participate in neuro exam, no obvious cranial nerve deficits, catalepsy noted in bilateral upper extremities Skin: no rashes Psyc: acting appropriately   ED Results / Procedures / Treatments   Labs (all labs ordered are listed, but only abnormal results are displayed) Labs Reviewed  CBC WITH DIFFERENTIAL/PLATELET - Abnormal; Notable for the following components:      Result Value   Eosinophils Absolute 0.6 (*)    All other components within normal limits  COMPREHENSIVE METABOLIC PANEL WITH GFR - Abnormal; Notable for the following components:   CO2 21 (*)    Glucose, Bld 125 (*)    Creatinine, Ser 1.41 (*)    GFR, Estimated 56 (*)    All other components within normal limits  URINALYSIS, ROUTINE W REFLEX MICROSCOPIC - Abnormal; Notable for the following components:   Color, Urine STRAW (*)    All other components within normal limits  CK    EKG None  Radiology CT Head Wo Contrast Result Date: 05/18/2023 CLINICAL DATA:  Headache, altered mental status. EXAM: CT HEAD WITHOUT CONTRAST TECHNIQUE: Contiguous axial images were obtained from the base of the skull through the vertex without intravenous contrast. RADIATION DOSE REDUCTION: This exam was performed according to the departmental dose-optimization program which includes automated exposure control, adjustment of the mA and/or kV according to patient size and/or use of iterative reconstruction technique. COMPARISON:  08/27/2022 FINDINGS: Brain: The brainstem, cerebellum, cerebral peduncles, thalami, basal ganglia, basilar cisterns, and ventricular system appear within normal limits. No intracranial hemorrhage, mass lesion, or acute  CVA. Vascular: Unremarkable Skull: Unremarkable Sinuses/Orbits: Left mastoid effusion versus hypoaeration, with progressive fluid/soft tissue density in the middle ear surrounding the ossicular chain suspicious for otitis media, and some plugging of the left bony external auditory canal as shown on image 13 series 3. Chronic calcification in the left antihelix region. 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 Correlate with patient history. Other: No supplemental non-categorized findings. IMPRESSION: 1. No acute intracranial findings. 2. Left mastoid effusion versus hypoaeration, with progressive fluid/soft tissue density in the left middle ear surrounding the ossicular chain suspicious for otitis media, and some plugging of the left bony external auditory canal versus otitis externa. 3. 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 Correlate with patient history. Electronically Signed   By: Tanner Fanny.D.  On: 05/18/2023 17:02    Procedures Procedures    Medications Ordered in ED Medications  LORazepam  (ATIVAN ) injection 1 mg (1 mg Intravenous Given 05/18/23 1610)  lactated ringers  bolus 1,000 mL (0 mLs Intravenous Stopped 05/18/23 1851)  amoxicillin -clavulanate (AUGMENTIN ) 875-125 MG per tablet 1 tablet (1 tablet Oral Given 05/18/23 2210)    ED Course/ Medical Decision Making/ A&P                                 Medical Decision Making 63 year old male with past medical history of schizoaffective disorder as well as MR, diabetes, hypertension, and hyperlipidemia presenting to the emergency department today with change in mental status from his baseline.  Will obtain basic labs here as well as a CK as he does have some catalepsy of the  upper extremities. Will obtain a head CT to evaluate for cental lesion given his history of cancer noted in his chart.  Will give him a trial of benzodiazepines to see if this helps with his symptoms.   On reassessment before the ativan  after having an IV placed the patient is moving his extremities and does not appear to be catatonic.  Will further evaluate with the above workup.  The patient CT scan did show findings concerning for possible otitis media on CT scan.  The patient's labs are reassuring.  On reassessment the patient is resting comfortably watching TV.  He has been eating and drinking here.  Think that he is stable for discharge.  Amount and/or Complexity of Data Reviewed Labs: ordered. Radiology: ordered.  Risk Prescription drug management.           Final Clinical Impression(s) / ED Diagnoses Final diagnoses:  Otitis media, unspecified laterality, unspecified otitis media type    Rx / DC Orders ED Discharge Orders          Ordered    amoxicillin -clavulanate (AUGMENTIN ) 875-125 MG tablet  Every 12 hours        05/18/23 2316              Carin Charleston, MD 05/18/23 2317

## 2023-05-18 NOTE — ED Notes (Signed)
Pt refused temperature.

## 2023-05-31 ENCOUNTER — Emergency Department (HOSPITAL_COMMUNITY)
Admission: EM | Admit: 2023-05-31 | Discharge: 2023-06-01 | Disposition: A | Discharge status: Home / Self Care | Attending: Emergency Medicine | Admitting: Emergency Medicine

## 2023-05-31 ENCOUNTER — Emergency Department (HOSPITAL_COMMUNITY)

## 2023-05-31 ENCOUNTER — Encounter (HOSPITAL_COMMUNITY): Payer: Self-pay | Admitting: *Deleted

## 2023-05-31 ENCOUNTER — Other Ambulatory Visit: Payer: Self-pay

## 2023-05-31 DIAGNOSIS — K209 Esophagitis, unspecified without bleeding: Secondary | ICD-10-CM | POA: Insufficient documentation

## 2023-05-31 DIAGNOSIS — E119 Type 2 diabetes mellitus without complications: Secondary | ICD-10-CM | POA: Insufficient documentation

## 2023-05-31 DIAGNOSIS — Z7984 Long term (current) use of oral hypoglycemic drugs: Secondary | ICD-10-CM | POA: Insufficient documentation

## 2023-05-31 DIAGNOSIS — R112 Nausea with vomiting, unspecified: Secondary | ICD-10-CM | POA: Diagnosis present

## 2023-05-31 DIAGNOSIS — Z79899 Other long term (current) drug therapy: Secondary | ICD-10-CM | POA: Diagnosis not present

## 2023-05-31 DIAGNOSIS — R1110 Vomiting, unspecified: Secondary | ICD-10-CM

## 2023-05-31 LAB — TROPONIN I (HIGH SENSITIVITY): Troponin I (High Sensitivity): 6 ng/L (ref <18)

## 2023-05-31 LAB — CBC WITH DIFFERENTIAL/PLATELET
Abs Immature Granulocytes: 0.03 10*3/uL (ref 0.00–0.07)
Basophils Absolute: 0 10*3/uL (ref 0.0–0.1)
Basophils Relative: 0 %
Eosinophils Absolute: 0.1 10*3/uL (ref 0.0–0.5)
Eosinophils Relative: 1 %
HCT: 45.1 % (ref 39.0–52.0)
Hemoglobin: 15.1 g/dL (ref 13.0–17.0)
Immature Granulocytes: 0 %
Lymphocytes Relative: 5 %
Lymphs Abs: 0.4 10*3/uL — ABNORMAL LOW (ref 0.7–4.0)
MCH: 29.7 pg (ref 26.0–34.0)
MCHC: 33.5 g/dL (ref 30.0–36.0)
MCV: 88.8 fL (ref 80.0–100.0)
Monocytes Absolute: 0.3 10*3/uL (ref 0.1–1.0)
Monocytes Relative: 4 %
Neutro Abs: 7 10*3/uL (ref 1.7–7.7)
Neutrophils Relative %: 90 %
Platelets: 240 10*3/uL (ref 150–400)
RBC: 5.08 MIL/uL (ref 4.22–5.81)
RDW: 13.8 % (ref 11.5–15.5)
WBC: 7.9 10*3/uL (ref 4.0–10.5)
nRBC: 0 % (ref 0.0–0.2)

## 2023-05-31 LAB — URINALYSIS, W/ REFLEX TO CULTURE (INFECTION SUSPECTED)
Bacteria, UA: NONE SEEN
Bilirubin Urine: NEGATIVE
Glucose, UA: NEGATIVE mg/dL
Hgb urine dipstick: NEGATIVE
Ketones, ur: 5 mg/dL — AB
Leukocytes,Ua: NEGATIVE
Nitrite: NEGATIVE
Protein, ur: NEGATIVE mg/dL
Specific Gravity, Urine: 1.015 (ref 1.005–1.030)
pH: 5 (ref 5.0–8.0)

## 2023-05-31 LAB — COMPREHENSIVE METABOLIC PANEL WITH GFR
ALT: 14 U/L (ref 0–44)
AST: 22 U/L (ref 15–41)
Albumin: 3.8 g/dL (ref 3.5–5.0)
Alkaline Phosphatase: 69 U/L (ref 38–126)
Anion gap: 14 (ref 5–15)
BUN: 13 mg/dL (ref 8–23)
CO2: 21 mmol/L — ABNORMAL LOW (ref 22–32)
Calcium: 9.1 mg/dL (ref 8.9–10.3)
Chloride: 101 mmol/L (ref 98–111)
Creatinine, Ser: 1.37 mg/dL — ABNORMAL HIGH (ref 0.61–1.24)
GFR, Estimated: 58 mL/min — ABNORMAL LOW (ref >60)
Glucose, Bld: 130 mg/dL — ABNORMAL HIGH (ref 70–99)
Potassium: 4.4 mmol/L (ref 3.5–5.1)
Sodium: 136 mmol/L (ref 135–145)
Total Bilirubin: 0.5 mg/dL (ref 0.0–1.2)
Total Protein: 7.6 g/dL (ref 6.5–8.1)

## 2023-05-31 LAB — D-DIMER, QUANTITATIVE: D-Dimer, Quant: 1.01 ug{FEU}/mL — ABNORMAL HIGH (ref 0.00–0.50)

## 2023-05-31 LAB — LACTIC ACID, PLASMA
Lactic Acid, Venous: 2 mmol/L (ref 0.5–1.9)
Lactic Acid, Venous: 2 mmol/L (ref 0.5–1.9)

## 2023-05-31 LAB — TSH: TSH: 1.784 u[IU]/mL (ref 0.350–4.500)

## 2023-05-31 LAB — LIPASE, BLOOD: Lipase: 48 U/L (ref 11–51)

## 2023-05-31 MED ORDER — LACTATED RINGERS IV BOLUS
1000.0000 mL | Freq: Once | INTRAVENOUS | Status: AC
  Administered 2023-05-31: 1000 mL via INTRAVENOUS

## 2023-05-31 MED ORDER — LIDOCAINE VISCOUS HCL 2 % MT SOLN
15.0000 mL | Freq: Once | OROMUCOSAL | Status: AC
  Administered 2023-05-31: 15 mL via ORAL
  Filled 2023-05-31: qty 15

## 2023-05-31 MED ORDER — PANTOPRAZOLE SODIUM 40 MG PO TBEC: 40.0000 mg | DELAYED_RELEASE_TABLET | Freq: Every day | ORAL | 0 refills | Status: DC

## 2023-05-31 MED ORDER — LACTATED RINGERS IV BOLUS
500.0000 mL | Freq: Once | INTRAVENOUS | Status: AC
  Administered 2023-05-31: 500 mL via INTRAVENOUS

## 2023-05-31 MED ORDER — METOPROLOL SUCCINATE ER 25 MG PO TB24
25.0000 mg | ORAL_TABLET | Freq: Once | ORAL | Status: AC
  Administered 2023-05-31: 25 mg via ORAL
  Filled 2023-05-31: qty 1

## 2023-05-31 MED ORDER — PANTOPRAZOLE SODIUM 40 MG IV SOLR
40.0000 mg | Freq: Once | INTRAVENOUS | Status: AC
  Administered 2023-05-31: 40 mg via INTRAVENOUS
  Filled 2023-05-31: qty 10

## 2023-05-31 MED ORDER — METOPROLOL TARTRATE 5 MG/5ML IV SOLN
5.0000 mg | Freq: Once | INTRAVENOUS | Status: AC
  Administered 2023-05-31: 5 mg via INTRAVENOUS
  Filled 2023-05-31: qty 5

## 2023-05-31 MED ORDER — IOHEXOL 300 MG/ML  SOLN
100.0000 mL | Freq: Once | INTRAMUSCULAR | Status: AC | PRN
  Administered 2023-05-31: 100 mL via INTRAVENOUS

## 2023-05-31 MED ORDER — SODIUM CHLORIDE 0.9 % IV BOLUS
1000.0000 mL | Freq: Once | INTRAVENOUS | Status: AC
  Administered 2023-05-31: 1000 mL via INTRAVENOUS

## 2023-05-31 MED ORDER — IOHEXOL 350 MG/ML SOLN
75.0000 mL | Freq: Once | INTRAVENOUS | Status: AC | PRN
  Administered 2023-05-31: 75 mL via INTRAVENOUS

## 2023-05-31 MED ORDER — ONDANSETRON HCL 4 MG/2ML IJ SOLN
4.0000 mg | Freq: Once | INTRAMUSCULAR | Status: AC
  Administered 2023-05-31: 4 mg via INTRAVENOUS
  Filled 2023-05-31: qty 2

## 2023-05-31 MED ORDER — ALUM & MAG HYDROXIDE-SIMETH 200-200-20 MG/5ML PO SUSP
30.0000 mL | Freq: Once | ORAL | Status: AC
  Administered 2023-05-31: 30 mL via ORAL
  Filled 2023-05-31: qty 30

## 2023-05-31 NOTE — ED Provider Notes (Incomplete Revision)
 [place] EMERGENCY DEPARTMENT AT [facility] Provider Note   CSN: [id number] Arrival date & time: 05/31/23  1052     History  Chief Complaint  Patient presents with  . Emesis    Shawn Baird is a 63 y.o. male. He has history of high cholesterol, diabetes, schizoaffective disorder, GERD and is deaf.  He presents to the ER from [facility] nursing facility for repeated vomiting today.  I spoke with the nurse there to obtain history as patient just answers yes to any question regardless and is unable to provide further history.  He started vomiting around 6 AM this morning and was unable to keep anything down, no diarrhea, no hematemesis or hematochezia per her.  No noted fevers or chills.   Emesis      Home Medications Prior to Admission medications   Medication Sig Start Date End Date Taking? Authorizing Provider  amLODipine  (NORVASC ) 5 MG tablet Take 1 tablet (5 mg total) by mouth every morning. 10/14/13   Lissa Riding, NP  amoxicillin -clavulanate (AUGMENTIN ) 875-125 MG tablet Take 1 tablet by mouth every 12 (twelve) hours. 05/18/23   Carin Charleston, MD  aspirin  EC 81 MG tablet Take 1 tablet (81 mg total) by mouth every morning. 10/14/13   Lissa Riding, NP  atorvastatin  (LIPITOR) 40 MG tablet Take 1 tablet (40 mg total) by mouth daily. 09/01/22   Johnson, Clanford L, MD  divalproex  (DEPAKOTE  ER) 500 MG 24 hr tablet Take 2 tablets (1,000 mg total) by mouth at bedtime. 08/29/22   Johnson, Clanford L, MD  ezetimibe  (ZETIA ) 10 MG tablet Take 1 tablet (10 mg total) by mouth daily. 09/01/22   Johnson, Clanford L, MD  haloperidol  (HALDOL ) 5 MG tablet Take 5 mg by mouth 2 (two) times daily.    [provider]  icosapent  Ethyl (VASCEPA ) 1 g capsule Take 1 capsule (1 g total) by mouth 2 (two) times daily. 10/16/22   Mallipeddi, Vishnu P, MD  levothyroxine  (SYNTHROID ) 25 MCG tablet Take 25 mcg by mouth daily before breakfast. 05/12/22   [provider]  LINZESS  290  MCG CAPS capsule Take 1 capsule (290 mcg total) by mouth daily before breakfast. 05/03/23   Evander Hills, PA-C  loratadine  (CLARITIN ) 10 MG tablet Take 10 mg by mouth at bedtime.    [provider]  metFORMIN  (GLUCOPHAGE ) 500 MG tablet Take 1 tablet (500 mg total) by mouth 2 (two) times daily. 08/31/22   Johnson, Clanford L, MD  Methylcellulose, Laxative, (FIBER THERAPY PO) Take 1 Scoop by mouth See admin instructions. 1 tablespoon in a full glass of water  by mouth 2 times weekly on Tuesday and Thursday.    [provider]  metoprolol  succinate (TOPROL -XL) 25 MG 24 hr tablet Take 12.5 mg by mouth daily. 05/12/22   [provider]  omeprazole  (PRILOSEC) 20 MG capsule Take 1 capsule (20 mg total) by mouth every morning. 10/14/13   Lissa Riding, NP  polyethylene glycol powder (GLYCOLAX /MIRALAX ) 17 GM/SCOOP powder Take 17 g by mouth 2 (two) times daily. Patient taking differently: Take 17 g by mouth every other day. 05/20/22   Evander Hills, PA-C  QUEtiapine  (SEROQUEL  XR) 400 MG 24 hr tablet Take 1 tablet (400 mg total) by mouth at bedtime. 08/29/22   Johnson, Clanford L, MD  traZODone  (DESYREL ) 100 MG tablet Take 200 mg by mouth at bedtime.    [provider]  Vitamin D , Ergocalciferol , (DRISDOL ) 1.25 MG (50000 UNIT) CAPS  capsule Take 1 capsule (50,000 Units total) by mouth every Sunday. 08/30/22   Johnson, Clanford L, MD  Zinc 50 MG TABS Take 50 mg by mouth daily.    [provider]      Allergies    Patient has no known allergies.    Review of Systems   Review of Systems  Gastrointestinal:  Positive for vomiting.    Physical Exam Updated Vital Signs BP 132/87 (BP Location: Left Arm)   Pulse (!) 104   Temp 98.5 F (36.9 C) (Oral)   Resp 20   Ht 5\' 4"  (1.626 m)   Wt 72.8 kg   SpO2 93%   BMI 27.55 kg/m  Physical Exam Vitals and nursing note reviewed.  Constitutional:      General: He is not in acute distress.    Appearance: He is  well-developed.  HENT:     Head: Normocephalic and atraumatic.     Mouth/Throat:     Mouth: Mucous membranes are dry.  Eyes:     Extraocular Movements: Extraocular movements intact.     Conjunctiva/sclera: Conjunctivae normal.     Pupils: Pupils are equal, round, and reactive to light.  Cardiovascular:     Rate and Rhythm: Normal rate and regular rhythm.     Heart sounds: No murmur heard. Pulmonary:     Effort: Pulmonary effort is normal. No respiratory distress.     Breath sounds: Normal breath sounds.  Abdominal:     Palpations: Abdomen is soft.     Tenderness: There is no abdominal tenderness.     Comments: Mild epigastric tenderness  Musculoskeletal:        General: No swelling or tenderness.     Cervical back: Neck supple.     Right lower leg: No edema.     Left lower leg: No edema.  Skin:    General: Skin is warm and dry.     Capillary Refill: Capillary refill takes less than 2 seconds.  Neurological:     General: No focal deficit present.     Mental Status: He is alert and oriented to person, place, and time.  Psychiatric:        Mood and Affect: Mood normal.    ED Results / Procedures / Treatments   Labs (all labs ordered are listed, but only abnormal results are displayed) Labs Reviewed  COMPREHENSIVE METABOLIC PANEL WITH GFR - Abnormal; Notable for the following components:      Result Value   CO2 21 (*)    Glucose, Bld 130 (*)    Creatinine, Ser 1.37 (*)    GFR, Estimated 58 (*)    All other components within normal limits  CBC WITH DIFFERENTIAL/PLATELET - Abnormal; Notable for the following components:   Lymphs Abs 0.4 (*)    All other components within normal limits  LIPASE, BLOOD  LACTIC ACID, PLASMA  LACTIC ACID, PLASMA  URINALYSIS, W/ REFLEX TO CULTURE (INFECTION SUSPECTED)    EKG EKG Interpretation Date/Time:  Monday May 31 2023 11:09:58 EDT Ventricular Rate:  114 PR Interval:  139 QRS Duration:  123 QT Interval:  306 QTC  Calculation: 422 R Axis:   131  Text Interpretation: Sinus tachycardia Atrial premature complexes RBBB and LPFB Minimal ST elevation, lateral leads Since last tracing rate faster Confirmed by Sueellen Emery [phone]) on 05/31/2023 11:29:16 AM  Radiology DG Chest 2 View Result Date: 05/31/2023 CLINICAL DATA:  Suspected sepsis. EXAM: CHEST - 2 VIEW COMPARISON:  Chest radiograph dated 08/27/2022.  FINDINGS: Shallow inspiration. Bibasilar atelectasis with no focal consolidation, pleural effusion or pneumothorax. Stable cardiac silhouette. No acute osseous pathology. IMPRESSION: Bibasilar atelectasis. No focal consolidation. Electronically Signed   By: Angus Bark M.D.   On: 05/31/2023 12:18    Procedures Procedures    Medications Ordered in ED Medications  ondansetron  (ZOFRAN ) injection 4 mg (4 mg Intravenous Given 05/31/23 1201)  sodium chloride  0.9 % bolus 1,000 mL (1,000 mLs Intravenous New Bag/Given 05/31/23 1200)    ED Course/ Medical Decision Making/ A&P                                 Medical Decision Making This patient presents to the ED for concern of nausea and vomiting, this involves an extensive number of treatment options, and is a complaint that carries with it a high risk of complications and morbidity.  The differential diagnosis includes psoriasis, gastroenteritis, esophagitis, bowel obstruction, pancreatitis, kidney stone, diabetes, other   Co morbidities that complicate the patient evaluation :   Diabetes, schizophrenia, high cholesterol, GERD   Additional history obtained:  Additional history obtained from EMR External records from outside source obtained and reviewed including prior notes   Lab Tests:  I Ordered, and personally interpreted labs.  The pertinent results include:    CBC-no leukocytosis or anemia Lipase-normal CMP-slightly decreased CO2 consistent with mild dehydration, creatinine 1.37 which is around his usual baseline Lactic acid slightly  elevated at 2.0   Imaging Studies ordered:  I ordered imaging studies including chest x-ray which shows pulmonary edema or infiltrate, CT abdomen/pelvis shows slight thickening of distal esophagus no other acute findings I independently visualized and interpreted imaging within scope of identifying emergent findings  I agree with the radiologist interpretation   Cardiac Monitoring: / EKG:  The patient was maintained on a cardiac monitor.  I personally viewed and interpreted the cardiac monitored which showed an underlying rhythm of: Sinus tachycardia     Problem List / ED Course / Critical interventions / Medication management   Nausea vomiting-labs overall reassuring, given IV fluids as patient was tachycardic and had repeated vomiting this morning, heart rate improving, CT shows some slight thickening of the distal esophagus compatible with esophagitis.  Patient was given Protonix  for this.  He still persistently tachycardic, I reviewed his medication list, he is on metoprolol  daily, was administered this this morning but per nursing home he was vomiting all morning so likely did not keep it down.  Will give a dose of metoprolol  and reevaluate.  He is tolerating p.o. now, he has no complaints, will discharge if no longer tachycardic, discussed with Blair Bumpers, PA-C in signout and is going to follow-up on this. Will have patient follow-up PCP and GI I have reviewed the patients home medicines and have made adjustments as needed   Amount and/or Complexity of Data Reviewed Labs: ordered. Radiology: ordered.  Risk Prescription drug management.           Final Clinical Impression(s) / ED Diagnoses Final diagnoses:  None    Rx / DC Orders ED Discharge Orders     None         Aimee Houseman, PA-C 05/31/23 1743

## 2023-05-31 NOTE — ED Notes (Signed)
 Lactic of 2.0 reported to [facility] at this time

## 2023-05-31 NOTE — ED Provider Notes (Cosign Needed Addendum)
 [place] EMERGENCY DEPARTMENT AT [facility] Provider Note   CSN: [id number] Arrival date & time: 05/31/23  1052     History  Chief Complaint  Patient presents with  . Emesis    Shawn Baird is a 63 y.o. male. He has history of high cholesterol, diabetes, schizoaffective disorder, GERD and is deaf.  He presents to the ER from [facility] nursing facility for repeated vomiting today.  I spoke with the nurse there to obtain history as patient just answers yes to any question regardless and is unable to provide further history.  He started vomiting around 6 AM this morning and was unable to keep anything down, no diarrhea, no hematemesis or hematochezia per her.  No noted fevers or chills.   Emesis      Home Medications Prior to Admission medications   Medication Sig Start Date End Date Taking? Authorizing Provider  amLODipine  (NORVASC ) 5 MG tablet Take 1 tablet (5 mg total) by mouth every morning. 10/14/13   Lissa Riding, NP  amoxicillin -clavulanate (AUGMENTIN ) 875-125 MG tablet Take 1 tablet by mouth every 12 (twelve) hours. 05/18/23   Carin Charleston, MD  aspirin  EC 81 MG tablet Take 1 tablet (81 mg total) by mouth every morning. 10/14/13   Lissa Riding, NP  atorvastatin  (LIPITOR) 40 MG tablet Take 1 tablet (40 mg total) by mouth daily. 09/01/22   Johnson, Clanford L, MD  divalproex  (DEPAKOTE  ER) 500 MG 24 hr tablet Take 2 tablets (1,000 mg total) by mouth at bedtime. 08/29/22   Johnson, Clanford L, MD  ezetimibe  (ZETIA ) 10 MG tablet Take 1 tablet (10 mg total) by mouth daily. 09/01/22   Johnson, Clanford L, MD  haloperidol  (HALDOL ) 5 MG tablet Take 5 mg by mouth 2 (two) times daily.    [provider]  icosapent  Ethyl (VASCEPA ) 1 g capsule Take 1 capsule (1 g total) by mouth 2 (two) times daily. 10/16/22   Mallipeddi, Vishnu P, MD  levothyroxine  (SYNTHROID ) 25 MCG tablet Take 25 mcg by mouth daily before breakfast. 05/12/22   [provider]  LINZESS  290  MCG CAPS capsule Take 1 capsule (290 mcg total) by mouth daily before breakfast. 05/03/23   Evander Hills, PA-C  loratadine  (CLARITIN ) 10 MG tablet Take 10 mg by mouth at bedtime.    [provider]  metFORMIN  (GLUCOPHAGE ) 500 MG tablet Take 1 tablet (500 mg total) by mouth 2 (two) times daily. 08/31/22   Johnson, Clanford L, MD  Methylcellulose, Laxative, (FIBER THERAPY PO) Take 1 Scoop by mouth See admin instructions. 1 tablespoon in a full glass of water  by mouth 2 times weekly on Tuesday and Thursday.    [provider]  metoprolol  succinate (TOPROL -XL) 25 MG 24 hr tablet Take 12.5 mg by mouth daily. 05/12/22   [provider]  omeprazole  (PRILOSEC) 20 MG capsule Take 1 capsule (20 mg total) by mouth every morning. 10/14/13   Lissa Riding, NP  polyethylene glycol powder (GLYCOLAX /MIRALAX ) 17 GM/SCOOP powder Take 17 g by mouth 2 (two) times daily. Patient taking differently: Take 17 g by mouth every other day. 05/20/22   Evander Hills, PA-C  QUEtiapine  (SEROQUEL  XR) 400 MG 24 hr tablet Take 1 tablet (400 mg total) by mouth at bedtime. 08/29/22   Johnson, Clanford L, MD  traZODone  (DESYREL ) 100 MG tablet Take 200 mg by mouth at bedtime.    [provider]  Vitamin D , Ergocalciferol , (DRISDOL ) 1.25 MG (50000 UNIT) CAPS  capsule Take 1 capsule (50,000 Units total) by mouth every Sunday. 08/30/22   Johnson, Clanford L, MD  Zinc 50 MG TABS Take 50 mg by mouth daily.    [provider]      Allergies    Patient has no known allergies.    Review of Systems   Review of Systems  Gastrointestinal:  Positive for vomiting.    Physical Exam Updated Vital Signs BP 132/87 (BP Location: Left Arm)   Pulse (!) 104   Temp 98.5 F (36.9 C) (Oral)   Resp 20   Ht 5\' 4"  (1.626 m)   Wt 72.8 kg   SpO2 93%   BMI 27.55 kg/m  Physical Exam Vitals and nursing note reviewed.  Constitutional:      General: He is not in acute distress.    Appearance: He is  well-developed.  HENT:     Head: Normocephalic and atraumatic.     Mouth/Throat:     Mouth: Mucous membranes are dry.  Eyes:     Extraocular Movements: Extraocular movements intact.     Conjunctiva/sclera: Conjunctivae normal.     Pupils: Pupils are equal, round, and reactive to light.  Cardiovascular:     Rate and Rhythm: Normal rate and regular rhythm.     Heart sounds: No murmur heard. Pulmonary:     Effort: Pulmonary effort is normal. No respiratory distress.     Breath sounds: Normal breath sounds.  Abdominal:     Palpations: Abdomen is soft.     Tenderness: There is no abdominal tenderness.     Comments: Mild epigastric tenderness  Musculoskeletal:        General: No swelling or tenderness.     Cervical back: Neck supple.     Right lower leg: No edema.     Left lower leg: No edema.  Skin:    General: Skin is warm and dry.     Capillary Refill: Capillary refill takes less than 2 seconds.  Neurological:     General: No focal deficit present.     Mental Status: He is alert and oriented to person, place, and time.  Psychiatric:        Mood and Affect: Mood normal.    ED Results / Procedures / Treatments   Labs (all labs ordered are listed, but only abnormal results are displayed) Labs Reviewed  COMPREHENSIVE METABOLIC PANEL WITH GFR - Abnormal; Notable for the following components:      Result Value   CO2 21 (*)    Glucose, Bld 130 (*)    Creatinine, Ser 1.37 (*)    GFR, Estimated 58 (*)    All other components within normal limits  CBC WITH DIFFERENTIAL/PLATELET - Abnormal; Notable for the following components:   Lymphs Abs 0.4 (*)    All other components within normal limits  LIPASE, BLOOD  LACTIC ACID, PLASMA  LACTIC ACID, PLASMA  URINALYSIS, W/ REFLEX TO CULTURE (INFECTION SUSPECTED)    EKG EKG Interpretation Date/Time:  Monday May 31 2023 11:09:58 EDT Ventricular Rate:  114 PR Interval:  139 QRS Duration:  123 QT Interval:  306 QTC  Calculation: 422 R Axis:   131  Text Interpretation: Sinus tachycardia Atrial premature complexes RBBB and LPFB Minimal ST elevation, lateral leads Since last tracing rate faster Confirmed by Sueellen Emery [phone]) on 05/31/2023 11:29:16 AM  Radiology DG Chest 2 View Result Date: 05/31/2023 CLINICAL DATA:  Suspected sepsis. EXAM: CHEST - 2 VIEW COMPARISON:  Chest radiograph dated 08/27/2022.  FINDINGS: Shallow inspiration. Bibasilar atelectasis with no focal consolidation, pleural effusion or pneumothorax. Stable cardiac silhouette. No acute osseous pathology. IMPRESSION: Bibasilar atelectasis. No focal consolidation. Electronically Signed   By: Angus Bark M.D.   On: 05/31/2023 12:18    Procedures Procedures    Medications Ordered in ED Medications  ondansetron  (ZOFRAN ) injection 4 mg (4 mg Intravenous Given 05/31/23 1201)  sodium chloride  0.9 % bolus 1,000 mL (1,000 mLs Intravenous New Bag/Given 05/31/23 1200)    ED Course/ Medical Decision Making/ A&P                                 Medical Decision Making This patient presents to the ED for concern of nausea and vomiting, this involves an extensive number of treatment options, and is a complaint that carries with it a high risk of complications and morbidity.  The differential diagnosis includes psoriasis, gastroenteritis, esophagitis, bowel obstruction, pancreatitis, kidney stone, diabetes, other   Co morbidities that complicate the patient evaluation :   Diabetes, schizophrenia, high cholesterol, GERD   Additional history obtained:  Additional history obtained from EMR External records from outside source obtained and reviewed including prior notes   Lab Tests:  I Ordered, and personally interpreted labs.  The pertinent results include:    CBC-no leukocytosis or anemia Lipase-normal CMP-slightly decreased CO2 consistent with mild dehydration, creatinine 1.37 which is around his usual baseline Lactic acid slightly  elevated at 2.0   Imaging Studies ordered:  I ordered imaging studies including chest x-ray which shows pulmonary edema or infiltrate, CT abdomen/pelvis shows slight thickening of distal esophagus no other acute findings I independently visualized and interpreted imaging within scope of identifying emergent findings  I agree with the radiologist interpretation   Cardiac Monitoring: / EKG:  The patient was maintained on a cardiac monitor.  I personally viewed and interpreted the cardiac monitored which showed an underlying rhythm of: Sinus tachycardia     Problem List / ED Course / Critical interventions / Medication management   Nausea vomiting-labs overall reassuring, given IV fluids as patient was tachycardic and had repeated vomiting this morning, heart rate improving, CT shows some slight thickening of the distal esophagus compatible with esophagitis.  Patient was given Protonix  for this.  He still persistently tachycardic, I reviewed his medication list, he is on metoprolol  daily, was administered this this morning but per nursing home he was vomiting all morning so likely did not keep it down.  Will give a dose of metoprolol  and reevaluate.  He is tolerating p.o. now, he has no complaints, will discharge if no longer tachycardic, discussed with Blair Bumpers, PA-C in signout and is going to follow-up on this. Will have patient follow-up PCP and GI I have reviewed the patients home medicines and have made adjustments as needed   Amount and/or Complexity of Data Reviewed Labs: ordered. Radiology: ordered.  Risk Prescription drug management.           Final Clinical Impression(s) / ED Diagnoses Final diagnoses:  None    Rx / DC Orders ED Discharge Orders     None         Aimee Houseman, PA-C 05/31/23 1743

## 2023-05-31 NOTE — ED Notes (Signed)
Given gingerale at this time

## 2023-05-31 NOTE — ED Notes (Signed)
 Patient transported to CT

## 2023-05-31 NOTE — ED Provider Notes (Signed)
 Care of patient assumed from Georgia Rigney.  This patient presented with emesis.  Currently awaiting CTA chest. Physical Exam  BP 95/85 (BP Location: Right Arm)   Pulse 95   Temp 98.7 F (37.1 C) (Oral)   Resp 19   Ht 5\' 4"  (1.626 m)   Wt 72.8 kg   SpO2 94%   BMI 27.55 kg/m   Physical Exam Vitals and nursing note reviewed.  Constitutional:      General: He is not in acute distress.    Appearance: Normal appearance. He is well-developed. He is not ill-appearing, toxic-appearing or diaphoretic.  HENT:     Head: Normocephalic and atraumatic.     Right Ear: External ear normal.     Left Ear: External ear normal.     Nose: Nose normal.     Mouth/Throat:     Mouth: Mucous membranes are moist.  Eyes:     Extraocular Movements: Extraocular movements intact.     Conjunctiva/sclera: Conjunctivae normal.  Cardiovascular:     Rate and Rhythm: Normal rate and regular rhythm.  Pulmonary:     Effort: Pulmonary effort is normal. No respiratory distress.  Abdominal:     General: There is no distension.     Palpations: Abdomen is soft.  Musculoskeletal:        General: No swelling. Normal range of motion.     Cervical back: Normal range of motion and neck supple.  Skin:    General: Skin is warm and dry.     Coloration: Skin is not jaundiced or pale.  Neurological:     General: No focal deficit present.     Mental Status: He is alert.  Psychiatric:        Mood and Affect: Mood normal.        Behavior: Behavior normal.     Procedures  Procedures  ED Course / MDM    Medical Decision Making Amount and/or Complexity of Data Reviewed Labs: ordered. Radiology: ordered.  Risk Prescription drug management.   On assessment, patient resting comfortably.  Tachycardia has resolved.  His breathing is unlabored.  SpO2 remains normal on room air.  CTA was negative for PE or focal consolidation.  His imaging from earlier in the day did show distal esophageal wall thickening consistent with  reflux/esophagitis.  GI cocktail was ordered.  Per chart review, it does appear that he is prescribed omeprazole .  Patient was able to tolerate p.o. intake.  He was discharged in stable condition.       Iva Mariner, MD 05/31/23 [phone]

## 2023-05-31 NOTE — Discharge Instructions (Addendum)
 For vomiting, your blood work was reassuring, we gave you IV fluids.  Your CT scan showed some thickening of the esophagus, we are giving you medication called Protonix  for this and we will have you follow-up with the GI doctor.  Come back for new or worsening symptoms.

## 2023-05-31 NOTE — ED Provider Notes (Signed)
 Patient signed out to myself by Baxter Limber, PA-C. Patient presented to the ER for nausea and vomiting since this morning. Workup in the ED so far has been unremarkable but patient has remained tachycardic. Patient was ordered his home metoprolol  and signed out for continued monitoring.    Physical Exam  BP 116/77   Pulse 75   Temp 98.7 F (37.1 C) (Oral)   Resp (!) 29   Ht 5\' 4"  (1.626 m)   Wt 72.8 kg   SpO2 92%   BMI 27.55 kg/m   Physical Exam  Procedures  Procedures  ED Course / MDM    Medical Decision Making Amount and/or Complexity of Data Reviewed Labs: ordered. Radiology: ordered.  Risk Prescription drug management.   Patient has remained stable in the ED with no further nausea or vomiting. Tachycardia has improved after metoprolol . He has been seen by attending physician. D-dimer elevated and CTA PE study has been ordered and pending at this time.   Will sign patient out to Dr. Kermit Ped at 2200 on 05/31/23 pending CTA PE results.        Roselynn Connors, PA-C 05/31/23 2148    Iva Mariner, MD 06/01/23 0010

## 2023-05-31 NOTE — ED Notes (Signed)
 Patient transported to X-ray

## 2023-06-01 ENCOUNTER — Other Ambulatory Visit: Payer: Self-pay | Admitting: Internal Medicine

## 2023-06-01 ENCOUNTER — Telehealth (HOSPITAL_COMMUNITY): Payer: Self-pay | Admitting: Physician Assistant

## 2023-06-01 MED ORDER — PANTOPRAZOLE SODIUM 40 MG PO TBEC: 40.0000 mg | DELAYED_RELEASE_TABLET | Freq: Every day | ORAL | 0 refills | Status: DC

## 2023-06-01 NOTE — Telephone Encounter (Cosign Needed)
 Nursing home called and asked for prescription to be sent to [facility] in [place], this was done.

## 2023-06-30 ENCOUNTER — Other Ambulatory Visit: Payer: Self-pay | Admitting: Internal Medicine

## 2023-07-07 NOTE — Progress Notes (Signed)
 GI Office Note    Referring Provider: Heron Lord, FNP Primary Care Physician:  Shawn Lord, FNP Primary Gastroenterologist: Shawn Hatcher.Rourk, MD  Date:  07/08/2023  ID:  Shawn Baird, DOB 08-06-1960, MRN [id number]   Chief Complaint   Chief Complaint  Patient presents with   Follow-up    Hospital follow up.    History of Present Illness  Shawn Baird is a 63 y.o. male with a history of schizoaffective disorder, anxiety, mental disorder, Tourette's, HTN, HLD, diabetes, hypothyroidism, and chronic constipation presenting today for ED follow up of nausea and vomiting.    ER 03/16/22 for epigastric abdominal pain. Hgb slightly low at 12.4, otherwise, CMP and lipase wnl. CT A/P with contrast with no acute findings.    Per labs received in referral, Vitamin B12 was low normal at 212 and hemoglobin low at 11.5 in October 2023.  Last office visit 05/06/22 with Shawn Baird.  Caregiver helped provide history.  Reported that he was complaining of abdominal pain for 3 weeks or so and was associated with worsening constipation.  Was only having bowel movements about 3 days a week when he used to have them once daily.  Denied weight loss or appetite changes.  Possibly had been on Amitiza  24 mcg twice daily in the past but this was not on his med list and not taking at the time.  Possibly taking Linzess  145 mcg daily.  No witnessed vomiting or nausea, eating well at the time. Labs ordered - b12, folate, iron panel, stop linzess  145 and start 290 mcg daily. Increase water  intake and arrange colonoscopy after discussing with patient's sister. Sister was agreeable to colonoscopy. He had another ED visit for abdominal pain after this. After discussions with facility it was deemed that he should be taking miralax  BID along with linzess  290 mcg daily. After several large BMs post mag citrate and increase in miralax  he was feeling better.   Colonoscopy 07/21/22: - Two 4 to 6 mm polyps in the mid  ascending colon - elongated and redundant colon - Path: tubular adenomas - Repeat in 7 years.   ED visit Jan 2025 for N/V. This was for nausea, vomiting, and diarrhea. CT abdomen normal. Given antiemetics and fluids.   ED visit 05/31/23. Presented with vomiting. Tachycardic. CTA negative other than distal esophageal wall thickening consistent with reflux/esophagitis. Again treated with iv fluids, metoprolol . Advised f/u with gi and pcp.   CT A/P with contrast 05/31/23: - Mild distal esophageal wall thickening, nonspecific although esophagitis would be a common cause. - Mild sigmoid colon diverticulosis. No dilated bowel. Appendix not well seen although no Peri cecal inflammatory process is identified. - Chronic avascular necrosis of both femoral heads without flattening/collapse.   Today:  Notes state that visit today is for stomach pain and diarrhea and facility MD stopped Linzess  due to this.   Caregiver helps to provide most of history.  Stools have ben explosive for the last few months. Usually only going once a day but is consistently a lot. Having pain int he middle of his abdomen. Complaining of pain almost everyday. Linzess  stopped 2 weeks ago and this is still happening.   Ha had vomiting about 3 times a week and unable to correlate with anything in particular that he eats. No NSAIDs. Some things are bland and not greasy and still has the same issue. Has a great appetite. Stool is dark brown and not colored. Stool  is so explosive its no the walls and everything.  No melena or brpbr. Mostly has pain in the abdomen but occasionally in his bottom. Primarily BM in the daytime, no nocturnal stools.   Fiber is only a few times power week and miralax .   No other residents were sick at the time or recently.   Wt Readings from Last 3 Encounters:  05/31/23 160 lb 7.9 oz (72.8 kg)  05/18/23 160 lb 7.9 oz (72.8 kg)  04/09/23 160 lb 6.4 oz (72.8 kg)    Current Outpatient Medications   Medication Sig Dispense Refill   amLODipine  (NORVASC ) 5 MG tablet Take 1 tablet (5 mg total) by mouth every morning.     amoxicillin -clavulanate (AUGMENTIN ) 875-125 MG tablet Take 1 tablet by mouth every 12 (twelve) hours. 14 tablet 0   aspirin  EC 81 MG tablet Take 1 tablet (81 mg total) by mouth every morning.     atorvastatin  (LIPITOR) 40 MG tablet Take 1 tablet (40 mg total) by mouth daily.     divalproex  (DEPAKOTE  ER) 500 MG 24 hr tablet Take 2 tablets (1,000 mg total) by mouth at bedtime.     ezetimibe  (ZETIA ) 10 MG tablet Take 1 tablet (10 mg total) by mouth daily.     haloperidol  (HALDOL ) 5 MG tablet Take 5 mg by mouth 2 (two) times daily.     levothyroxine  (SYNTHROID ) 25 MCG tablet Take 25 mcg by mouth daily before breakfast.     LINZESS  290 MCG CAPS capsule Take 1 capsule (290 mcg total) by mouth daily before breakfast. (Patient taking differently: Take 1 capsule (290 mcg total) by mouth daily before breakfast.) 30 capsule 5   loratadine  (CLARITIN ) 10 MG tablet Take 10 mg by mouth at bedtime.     metFORMIN  (GLUCOPHAGE ) 500 MG tablet Take 1 tablet (500 mg total) by mouth 2 (two) times daily.     Methylcellulose, Laxative, (FIBER THERAPY PO) Take 1 Scoop by mouth See admin instructions. 1 tablespoon in a full glass of water  by mouth 2 times weekly on Tuesday and Thursday.     metoprolol  succinate (TOPROL -XL) 25 MG 24 hr tablet Take 12.5 mg by mouth daily.     QUEtiapine  (SEROQUEL  XR) 400 MG 24 hr tablet Take 1 tablet (400 mg total) by mouth at bedtime. 30 tablet 0   saccharomyces boulardii (FLORASTOR) 250 MG capsule Take 1 capsule (250 mg total) by mouth daily. 30 capsule 1   traZODone  (DESYREL ) 100 MG tablet Take 200 mg by mouth at bedtime.     VASCEPA  1 g capsule Take 1 capsule (1 g total) by mouth 2 (two) times daily. 180 capsule 2   Vitamin D , Ergocalciferol , (DRISDOL ) 1.25 MG (50000 UNIT) CAPS capsule Take 1 capsule (50,000 Units total) by mouth every Sunday.     Zinc 50 MG TABS Take  50 mg by mouth daily.     pantoprazole  (PROTONIX ) 40 MG tablet Take 1 tablet (40 mg total) by mouth 2 (two) times daily before a meal. 60 tablet 2   polyethylene glycol powder (GLYCOLAX /MIRALAX ) 17 GM/SCOOP powder Take 17 g by mouth daily as needed for mild constipation or moderate constipation (Give if no bowel movement). 850 g 3   No current facility-administered medications for this visit.    Past Medical History:  Diagnosis Date   Anxiety    Cancer (HCC)    Chronic constipation    Diabetes mellitus    HOH (hard of hearing)    Hyperlipemia    Hypertension    Hypothyroidism    Impulsive control  disorder   Mental disorder    Mental retardation    Otitis media    Pancreatitis    Schizoaffective disorder (HCC)    Tourette's disease     Past Surgical History:  Procedure Laterality Date   COLONOSCOPY WITH PROPOFOL  N/A 07/21/2022   Procedure: COLONOSCOPY WITH PROPOFOL ;  Surgeon: Suzette Espy, MD;  [facility];  Service: Endoscopy;  Laterality: N/A;  10:45am, asa 2   EXTERNAL EAR SURGERY     [facility]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    POLYPECTOMY  07/21/2022   Procedure: POLYPECTOMY;  Surgeon: Suzette Espy, MD;  [facility]: [facility];  Service: Endoscopy;;    Family History  Problem Relation Age of Onset   High blood pressure Sister    High Cholesterol Sister    Heart disease Mother    Stroke Mother     Allergies as of 07/08/2023   (No Known Allergies)    Social History   Socioeconomic History   Marital status: Single    Spouse name: Not on file   Number of children: Not on file   Years of education: Not on file   Highest education level: Not on file  Occupational History   Not on file  Tobacco Use   Smoking status: Never   Smokeless tobacco: Never  Vaping Use   Vaping status: Never Used  Substance and Sexual Activity   Alcohol use: No   Drug use: No   Sexual activity: Never  Other Topics Concern   Not on file  Social History Narrative   Shawn Baird  family group home   Social Drivers of Health   Financial Resource Strain: Not on file  Food Insecurity: No Food Insecurity (08/27/2022)   Hunger Vital Sign    Worried About Running Out of Food in the Last Year: Never true    Ran Out of Food in the Last Year: Never true  Transportation Needs: No Transportation Needs (08/27/2022)   PRAPARE - Administrator, Civil Service (Medical): No    Lack of Transportation (Non-Medical): No  Physical Activity: Not on file  Stress: Not on file  Social Connections: Not on file     Review of Systems   Gen: Denies fever, chills, anorexia. Denies fatigue, weakness, weight loss.  CV: Denies chest pain, palpitations, syncope, peripheral edema, and claudication. Resp: Denies dyspnea at rest, cough, wheezing, coughing up blood, and pleurisy. GI: See HPI Derm: Denies rash, itching, dry skin Psych: Denies depression, anxiety, memory loss, confusion. No homicidal or suicidal ideation.  Heme: Denies bruising, bleeding, and enlarged lymph nodes.  Physical Exam   BP 102/69 (BP Location: Left Arm, Patient Position: Sitting, Cuff Size: Normal)   Pulse 71   Temp 97.7 F (36.5 C) (Temporal)   General:   Alert. No distress noted. Pleasant and cooperative.  Head:  Normocephalic and atraumatic. Eyes:  Conjuctiva clear without scleral icterus. + HOH/deaf Abdomen:  +BS, soft, non-distended. Mild ttp to umbilicus and proximal periumbilical region. No rebound or guarding. No HSM or masses noted. Rectal: deferred Extremities:  Without edema. Neurologic:  Alert.  Psych:  Alert and cooperative. Normal mood and affect.  Assessment  Shawn Baird is a 63 y.o. male with a history of schizoaffective disorder, anxiety, mental disorder, Tourette's, HTN, HLD, diabetes, hypothyroidism, and chronic constipation presenting today for ED follow up.   GERD, esophageal wall thickening, vomiting: - Recent CT during ED visit with concerns for esophageal wall  thickening -Has had  a few episodes of vomiting, usually occurring about 3 times per week -Unable to identify any food triggers -Usually maintained on pantoprazole  once daily -Will increase PPI to twice daily for now given some upper abdominal discomfort and vomiting.  If no improvement will discuss EGD with his sister given wall thickening noted on imaging  Constipation, diarrhea: - History of constipation, initially treated with Linzess  and then had to add MiraLAX . - For about the last few weeks to 1 month he has been having explosive loose stools, getting this all over the toilet and wall.  Stools are usually only once per day. - Also has some complaints of periumbilical pain. - Recent CT without any acute abnormality - If stools become more frequent, will consider stool studies - Facility MD recently discontinued Linzess  (5/19).  Caregiver reports ongoing looser stools, unable to identify any dietary trigger.  Also received fiber twice per week and MiraLAX  every other day. - Mild TTP to umbilical region today as well as close epigastrium. - Bowel sounds not hyperactive today.  Anemia: - Anemia noted last year, most recent labs with normal hemoglobin - Colonoscopy up-to-date without significant findings, couple tubular adenomas removed. - Staff denies any melena or BRBPR.  No other concerning symptoms at this time - Has good appetite.  PLAN   Avoid NSAIDs Pantoprazole  40 mg - increase to BID Start daily probiotic Bland diet If still having issues with vomiting, will discuss EGD with sister given recent imaging findings. Switch MiraLAX  from scheduled to as needed Increase fiber to 3 times weekly, Monday Wednesday and Friday. Follow-up in 3-4 weeks    Julian Obey, MSN, FNP-BC, AGACNP-BC [facility]

## 2023-07-08 ENCOUNTER — Ambulatory Visit: Admitting: Gastroenterology

## 2023-07-08 ENCOUNTER — Encounter: Payer: Self-pay | Admitting: Gastroenterology

## 2023-07-08 VITALS — BP 102/69 | HR 71 | Temp 97.7°F

## 2023-07-08 DIAGNOSIS — D649 Anemia, unspecified: Secondary | ICD-10-CM

## 2023-07-08 DIAGNOSIS — R195 Other fecal abnormalities: Secondary | ICD-10-CM

## 2023-07-08 DIAGNOSIS — K5909 Other constipation: Secondary | ICD-10-CM

## 2023-07-08 DIAGNOSIS — K5900 Constipation, unspecified: Secondary | ICD-10-CM

## 2023-07-08 DIAGNOSIS — K2289 Other specified disease of esophagus: Secondary | ICD-10-CM

## 2023-07-08 DIAGNOSIS — R197 Diarrhea, unspecified: Secondary | ICD-10-CM

## 2023-07-08 DIAGNOSIS — R1110 Vomiting, unspecified: Secondary | ICD-10-CM

## 2023-07-08 DIAGNOSIS — K219 Gastro-esophageal reflux disease without esophagitis: Secondary | ICD-10-CM | POA: Diagnosis not present

## 2023-07-08 DIAGNOSIS — R933 Abnormal findings on diagnostic imaging of other parts of digestive tract: Secondary | ICD-10-CM

## 2023-07-08 MED ORDER — SACCHAROMYCES BOULARDII 250 MG PO CAPS: 250.0000 mg | ORAL_CAPSULE | Freq: Every day | ORAL | 1 refills | Status: DC

## 2023-07-08 MED ORDER — POLYETHYLENE GLYCOL 3350 17 GM/SCOOP PO POWD: 17.0000 g | Freq: Every day | ORAL | 3 refills | Status: AC | PRN

## 2023-07-08 MED ORDER — PANTOPRAZOLE SODIUM 40 MG PO TBEC: 40.0000 mg | DELAYED_RELEASE_TABLET | Freq: Two times a day (BID) | ORAL | 2 refills | Status: DC

## 2023-07-08 NOTE — Patient Instructions (Addendum)
 Please continue to follow a bland diet for the next week and then go back to normal diet.  Please stop giving MiraLAX  every other day and switch to as needed.  May give 1 capful every other day as needed if no bowel movement in 24 hours.  Increase fiber to 3 times weekly - Monday, Wednesday, and Friday.  Continue pantoprazole , will increase this to twice daily, 30 minutes prior to breakfast and dinner.  Please let us  know if symptoms become worse or vomiting becomes more frequent.  If no improvement at follow-up, will discuss upper endoscopy with sister.  Please start giving daily probiotic, I will send this to the pharmacy.  It was a pleasure to see you today. I want to create trusting relationships with patients. If you receive a survey regarding your visit,  I greatly appreciate you taking time to fill this out on paper or through your MyChart. I value your feedback.  Julian Obey, MSN, FNP-BC, AGACNP-BC [facility]

## 2023-08-03 NOTE — Progress Notes (Deleted)
 GI Office Note    Referring Provider: Barbaraann Harvey, FNP Primary Care Physician:  Barbaraann Harvey, FNP Primary Gastroenterologist: Lamar HERO.Rourk, MD  Date:  08/03/2023  ID:  Shawn Baird, DOB 10-17-1960, MRN [id number]  Chief Complaint   No chief complaint on file.  History of Present Illness  Shawn Baird is a 63 y.o. male with a history of schizoaffective disorder, anxiety, mental disorder, Tourette's, HTN, HLD, diabetes, hypothyroidism, and chronic constipation presenting today with complaint of ***  ER 03/16/22 for epigastric abdominal pain. Hgb slightly low at 12.4, otherwise, CMP and lipase wnl. CT A/P with contrast with no acute findings.    Per labs received in referral, Vitamin B12 was low normal at 212 and hemoglobin low at 11.5 in October 2023.   OV 05/06/22 with [facility].  Caregiver helped provide history.  Reported that he was complaining of abdominal pain for 3 weeks or so and was associated with worsening constipation.  Was only having bowel movements about 3 days a week when he used to have them once daily.  Denied weight loss or appetite changes.  Possibly had been on Amitiza  24 mcg twice daily in the past but this was not on his med list and not taking at the time.  Possibly taking Linzess  145 mcg daily.  No witnessed vomiting or nausea, eating well at the time. Labs ordered - b12, folate, iron panel, stop linzess  145 and start 290 mcg daily. Increase water  intake and arrange colonoscopy after discussing with patient's sister. Sister was agreeable to colonoscopy. He had another ED visit for abdominal pain after this. After discussions with facility it was deemed that he should be taking miralax  BID along with linzess  290 mcg daily. After several large BMs post mag citrate and increase in miralax  he was feeling better.    Colonoscopy 07/21/22: - Two 4 to 6 mm polyps in the mid ascending colon - elongated and redundant colon - Path: tubular adenomas - Repeat in 7  years.    ED visit Jan 2025 for N/V. This was for nausea, vomiting, and diarrhea. CT abdomen normal. Given antiemetics and fluids.    ED visit 05/31/23. Presented with vomiting. Tachycardic. CTA negative other than distal esophageal wall thickening consistent with reflux/esophagitis. Again treated with iv fluids, metoprolol . Advised f/u with gi and pcp.    CT A/P with contrast 05/31/23: - Mild distal esophageal wall thickening, nonspecific although esophagitis would be a common cause. - Mild sigmoid colon diverticulosis. No dilated bowel. Appendix not well seen although no Peri cecal inflammatory process is identified. - Chronic avascular necrosis of both femoral heads without flattening/collapse.   Last office visit 07/08/23. *** Increased PPI to BID. Start daily probiotic. Eat bland diet. If ongoing vomiting then plan to discuss EGD with sister. Miralax  moved to PRN from scheduled. Advised increase in fiber to three times weekly.   Today:    Wt Readings from Last 5 Encounters:  05/31/23 160 lb 7.9 oz (72.8 kg)  05/18/23 160 lb 7.9 oz (72.8 kg)  04/09/23 160 lb 6.4 oz (72.8 kg)  02/02/23 160 lb 15 oz (73 kg)  09/23/22 161 lb (73 kg)    Current Outpatient Medications  Medication Sig Dispense Refill   amLODipine  (NORVASC ) 5 MG tablet Take 1 tablet (5 mg total) by mouth every morning.     amoxicillin -clavulanate (AUGMENTIN ) 875-125 MG tablet Take 1 tablet by mouth every 12 (twelve) hours. 14 tablet 0   aspirin  EC 81 MG tablet Take 1 tablet (  81 mg total) by mouth every morning.     atorvastatin  (LIPITOR) 40 MG tablet Take 1 tablet (40 mg total) by mouth daily.     divalproex  (DEPAKOTE  ER) 500 MG 24 hr tablet Take 2 tablets (1,000 mg total) by mouth at bedtime.     ezetimibe  (ZETIA ) 10 MG tablet Take 1 tablet (10 mg total) by mouth daily.     haloperidol  (HALDOL ) 5 MG tablet Take 5 mg by mouth 2 (two) times daily.     levothyroxine  (SYNTHROID ) 25 MCG tablet Take 25 mcg by mouth daily  before breakfast.     LINZESS  290 MCG CAPS capsule Take 1 capsule (290 mcg total) by mouth daily before breakfast. (Patient taking differently: Take 1 capsule (290 mcg total) by mouth daily before breakfast.) 30 capsule 5   loratadine  (CLARITIN ) 10 MG tablet Take 10 mg by mouth at bedtime.     metFORMIN  (GLUCOPHAGE ) 500 MG tablet Take 1 tablet (500 mg total) by mouth 2 (two) times daily.     Methylcellulose, Laxative, (FIBER THERAPY PO) Take 1 Scoop by mouth See admin instructions. 1 tablespoon in a full glass of water  by mouth 2 times weekly on Tuesday and Thursday.     metoprolol  succinate (TOPROL -XL) 25 MG 24 hr tablet Take 12.5 mg by mouth daily.     pantoprazole  (PROTONIX ) 40 MG tablet Take 1 tablet (40 mg total) by mouth 2 (two) times daily before a meal. 60 tablet 2   polyethylene glycol powder (GLYCOLAX /MIRALAX ) 17 GM/SCOOP powder Take 17 g by mouth daily as needed for mild constipation or moderate constipation (Give if no bowel movement). 850 g 3   QUEtiapine  (SEROQUEL  XR) 400 MG 24 hr tablet Take 1 tablet (400 mg total) by mouth at bedtime. 30 tablet 0   saccharomyces boulardii (FLORASTOR) 250 MG capsule Take 1 capsule (250 mg total) by mouth daily. 30 capsule 1   traZODone  (DESYREL ) 100 MG tablet Take 200 mg by mouth at bedtime.     VASCEPA  1 g capsule Take 1 capsule (1 g total) by mouth 2 (two) times daily. 180 capsule 2   Vitamin D , Ergocalciferol , (DRISDOL ) 1.25 MG (50000 UNIT) CAPS capsule Take 1 capsule (50,000 Units total) by mouth every Sunday.     Zinc 50 MG TABS Take 50 mg by mouth daily.     No current facility-administered medications for this visit.    Past Medical History:  Diagnosis Date   Anxiety    Cancer (HCC)    Chronic constipation    Diabetes mellitus    HOH (hard of hearing)    Hyperlipemia    Hypertension    Hypothyroidism    Impulsive control disorder   Mental disorder    Mental retardation    Otitis media    Pancreatitis    Schizoaffective disorder  (HCC)    Tourette's disease     Past Surgical History:  Procedure Laterality Date   COLONOSCOPY WITH PROPOFOL  N/A 07/21/2022   Procedure: COLONOSCOPY WITH PROPOFOL ;  Surgeon: Shaaron Lamar HERO, MD;  [facility];  Service: Endoscopy;  Laterality: N/A;  10:45am, asa 2   EXTERNAL EAR SURGERY     [facility]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    POLYPECTOMY  07/21/2022   Procedure: POLYPECTOMY;  Surgeon: Shaaron Lamar HERO, MD;  [facility];  Service: Endoscopy;;    Family History  Problem Relation Age of Onset   High blood pressure Sister    High Cholesterol Sister  Heart disease Mother    Stroke Mother     Allergies as of 08/05/2023   (No Known Allergies)    Social History   Socioeconomic History   Marital status: Single    Spouse name: Not on file   Number of children: Not on file   Years of education: Not on file   Highest education level: Not on file  Occupational History   Not on file  Tobacco Use   Smoking status: Never   Smokeless tobacco: Never  Vaping Use   Vaping status: Never Used  Substance and Sexual Activity   Alcohol use: No   Drug use: No   Sexual activity: Never  Other Topics Concern   Not on file  Social History Narrative   [facility]   Social Drivers of Health   Financial Resource Strain: Not on file  Food Insecurity: No Food Insecurity (08/27/2022)   Hunger Vital Sign    Worried About Running Out of Food in the Last Year: Never true    Ran Out of Food in the Last Year: Never true  Transportation Needs: No Transportation Needs (08/27/2022)   PRAPARE - Administrator, Civil Service (Medical): No    Lack of Transportation (Non-Medical): No  Physical Activity: Not on file  Stress: Not on file  Social Connections: Not on file     Review of Systems   Gen: Denies fever, chills, anorexia. Denies fatigue, weakness, weight loss.  CV: Denies chest pain, palpitations, syncope, peripheral edema, and claudication. Resp:  Denies dyspnea at rest, cough, wheezing, coughing up blood, and pleurisy. GI: See HPI Derm: Denies rash, itching, dry skin Psych: Denies depression, anxiety, memory loss, confusion. No homicidal or suicidal ideation.  Heme: Denies bruising, bleeding, and enlarged lymph nodes.  Physical Exam   There were no vitals taken for this visit.  General:   Alert and oriented. No distress noted. Pleasant and cooperative.  Head:  Normocephalic and atraumatic. Eyes:  Conjuctiva clear without scleral icterus. Mouth:  Oral mucosa pink and moist. Good dentition. No lesions. Lungs:  Clear to auscultation bilaterally. No wheezes, rales, or rhonchi. No distress.  Heart:  S1, S2 present without murmurs appreciated.  Abdomen:  +BS, soft, non-tender and non-distended. No rebound or guarding. No HSM or masses noted. Rectal: *** Msk:  Symmetrical without gross deformities. Normal posture. Extremities:  Without edema. Neurologic:  Alert and  oriented x4 Psych:  Alert and cooperative. Normal mood and affect.  Assessment  Juwann Sherk is a 63 y.o. male presenting today with ***  Anemia:   Constipation, diarrhea:   GERD, esophageal wall thickening, vomiting:   PLAN   *** Pantoprazole  *** Avoid NSAIDs  Follow up ***    Charmaine Melia, MSN, FNP-BC, AGACNP-BC [facility]

## 2023-08-05 ENCOUNTER — Encounter: Payer: Self-pay | Admitting: Gastroenterology

## 2023-08-05 ENCOUNTER — Ambulatory Visit: Admitting: Gastroenterology

## 2023-08-16 ENCOUNTER — Ambulatory Visit (INDEPENDENT_AMBULATORY_CARE_PROVIDER_SITE_OTHER): Admitting: Otolaryngology

## 2023-08-16 ENCOUNTER — Ambulatory Visit (INDEPENDENT_AMBULATORY_CARE_PROVIDER_SITE_OTHER): Admitting: Audiology

## 2023-08-16 ENCOUNTER — Encounter (INDEPENDENT_AMBULATORY_CARE_PROVIDER_SITE_OTHER): Payer: Self-pay | Admitting: Otolaryngology

## 2023-08-16 VITALS — BP 103/67 | HR 73

## 2023-08-16 DIAGNOSIS — H93299 Other abnormal auditory perceptions, unspecified ear: Secondary | ICD-10-CM

## 2023-08-16 DIAGNOSIS — H6123 Impacted cerumen, bilateral: Secondary | ICD-10-CM | POA: Diagnosis not present

## 2023-08-16 DIAGNOSIS — H900 Conductive hearing loss, bilateral: Secondary | ICD-10-CM | POA: Diagnosis not present

## 2023-08-16 DIAGNOSIS — Z0110 Encounter for examination of ears and hearing without abnormal findings: Secondary | ICD-10-CM

## 2023-08-16 DIAGNOSIS — H6983 Other specified disorders of Eustachian tube, bilateral: Secondary | ICD-10-CM | POA: Diagnosis not present

## 2023-08-16 NOTE — Progress Notes (Signed)
  [address] [place], [place] [address] [phone]  Audiological Evaluation    Name: Shawn Baird     DOB:   09-02-1960      MRN:   [id number]                                                                                     Service Date: 08/16/2023     Accompanied by: caregiver form group home   Patient comes today after Dr. Karis, ENT sent a referral for a hearing evaluation due to concerns with recurrent ear infections. Patient's medical history from the group home mention several diagnosis including intelectual disability, impulsive behavior, and hearing loss.  Test was attempted after both ears were clear from wax by Dr. Karis., ENT. Dr. Karis reported his eardrums to be retracted.  Otoscopy: Right ear: Clear external ear canal and notable landmarks visualized on the tympanic membrane. Left ear:  Clear external ear canal and notable landmarks visualized on the tympanic membrane.  Tympanometry: Right ear: Type B- Normal external ear canal volume with no middle ear pressure peak or tympanic membrane compliance. Left ear: Type B- Normal external ear canal volume with no middle ear pressure peak or tympanic membrane compliance.  Pure tone Audiometry: Due to patient's interest or developmental level, we were unable to determine hearing status at this time.   Speech Audiometry: Unable to obtained information for his ability to hear words at a soft level. Attempted picture board and he did recognize the pictures and said them too, but only in a specific pattern which was not suitable for testing purposes. Attempted body parts without success.      The hearing test results were completed under headphones and results are deemed to be of good reliability. Test technique:  conventional      Recommendations: Follow up with ENT as scheduled for today.   Shykeria Sakamoto MARIE LEROUX-MARTINEZ, AUD

## 2023-08-17 DIAGNOSIS — H6983 Other specified disorders of Eustachian tube, bilateral: Secondary | ICD-10-CM | POA: Insufficient documentation

## 2023-08-17 DIAGNOSIS — H900 Conductive hearing loss, bilateral: Secondary | ICD-10-CM | POA: Insufficient documentation

## 2023-08-17 DIAGNOSIS — H6123 Impacted cerumen, bilateral: Secondary | ICD-10-CM | POA: Insufficient documentation

## 2023-08-17 NOTE — Progress Notes (Signed)
 CC: Ear infections, hearing loss  HPI:  Shawn Baird is a 63 y.o. male who presents today with his caregiver.  The patient has a history of mental retardation and schizophrenia, and is currently in a group home.  The patient is here for evaluation of possible ear infection and hearing loss.  The patient is minimally communicative and is unable to provide a good history.  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  Past Medical History:  Diagnosis Date   Anxiety    Cancer (HCC)    Chronic constipation    Diabetes mellitus    HOH (hard of hearing)    Hyperlipemia    Hypertension    Hypothyroidism    Impulsive control disorder   Mental disorder    Mental retardation    Otitis media    Pancreatitis    Schizoaffective disorder (HCC)    Tourette's disease     Past Surgical History:  Procedure Laterality Date   COLONOSCOPY WITH PROPOFOL  N/A 07/21/2022   Procedure: COLONOSCOPY WITH PROPOFOL ;  Surgeon: Shaaron Lamar HERO, MD;  [facility];  Service: Endoscopy;  Laterality: N/A;  10:45am, asa 2   EXTERNAL EAR SURGERY     [facility]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    POLYPECTOMY  07/21/2022   Procedure: POLYPECTOMY;  Surgeon: Shaaron Lamar HERO, MD;  Location: [facility];  Service: Endoscopy;;    Family History  Problem Relation Age of Onset   High blood pressure Sister    High Cholesterol Sister    Heart disease Mother    Stroke Mother     Social History:  reports that he has never smoked. He has never used smokeless tobacco. He reports that he does not drink alcohol and does not use drugs.  Allergies: No Known Allergies  Prior to Admission medications   Medication Sig Start Date End Date Taking? Authorizing Provider  amLODipine  (NORVASC ) 5 MG tablet Take 1 tablet (5 mg total) by mouth every morning. 10/14/13  Yes Jacquetta Sharlot GRADE, NP  amoxicillin -clavulanate (AUGMENTIN ) 875-125 MG tablet Take 1 tablet by mouth  every 12 (twelve) hours. 05/18/23  Yes Ula Prentice SAUNDERS, MD  aspirin  EC 81 MG tablet Take 1 tablet (81 mg total) by mouth every morning. 10/14/13  Yes Jacquetta Sharlot GRADE, NP  atorvastatin  (LIPITOR) 40 MG tablet Take 1 tablet (40 mg total) by mouth daily. 09/01/22  Yes Johnson, Clanford L, MD  divalproex  (DEPAKOTE  ER) 500 MG 24 hr tablet Take 2 tablets (1,000 mg total) by mouth at bedtime. 08/29/22  Yes Johnson, Clanford L, MD  ezetimibe  (ZETIA ) 10 MG tablet Take 1 tablet (10 mg total) by mouth daily. 09/01/22  Yes Johnson, Clanford L, MD  haloperidol  (HALDOL ) 5 MG tablet Take 5 mg by mouth 2 (two) times daily.   Yes [provider]  levothyroxine  (SYNTHROID ) 25 MCG tablet Take 25 mcg by mouth daily before breakfast. 05/12/22  Yes [provider]  LINZESS  290 MCG CAPS capsule Take 1 capsule (290 mcg total) by mouth daily before breakfast. Patient taking differently: Take 1 capsule (290 mcg total) by mouth daily before breakfast. 05/03/23  Yes Rudy Josette RAMAN, PA-C  loratadine  (CLARITIN ) 10 MG tablet Take 10 mg by mouth at bedtime.   Yes [provider]  metFORMIN  (GLUCOPHAGE ) 500 MG tablet Take 1 tablet (500 mg total) by mouth 2 (two) times daily. 08/31/22  Yes Vicci Afton CROME, MD  Methylcellulose,  Laxative, (FIBER THERAPY PO) Take 1 Scoop by mouth See admin instructions. 1 tablespoon in a full glass of water  by mouth 2 times weekly on Tuesday and Thursday.   Yes [provider]  metoprolol  succinate (TOPROL -XL) 25 MG 24 hr tablet Take 12.5 mg by mouth daily. 05/12/22  Yes [provider]  pantoprazole  (PROTONIX ) 40 MG tablet Take 1 tablet (40 mg total) by mouth 2 (two) times daily before a meal. 07/08/23  Yes Mahon, Charmaine CROME, NP  polyethylene glycol powder (GLYCOLAX /MIRALAX ) 17 GM/SCOOP powder Take 17 g by mouth daily as needed for mild constipation or moderate constipation (Give if no bowel movement). 07/08/23  Yes Mahon, Charmaine CROME, NP  QUEtiapine  (SEROQUEL  XR) 400 MG  24 hr tablet Take 1 tablet (400 mg total) by mouth at bedtime. 08/29/22  Yes Johnson, Clanford L, MD  saccharomyces boulardii (FLORASTOR) 250 MG capsule Take 1 capsule (250 mg total) by mouth daily. 07/08/23  Yes Mahon, Charmaine CROME, NP  traZODone  (DESYREL ) 100 MG tablet Take 200 mg by mouth at bedtime.   Yes [provider]  VASCEPA  1 g capsule Take 1 capsule (1 g total) by mouth 2 (two) times daily. 06/30/23  Yes Mallipeddi, Vishnu P, MD  Vitamin D , Ergocalciferol , (DRISDOL ) 1.25 MG (50000 UNIT) CAPS capsule Take 1 capsule (50,000 Units total) by mouth every Sunday. 08/30/22  Yes Johnson, Clanford L, MD  Zinc 50 MG TABS Take 50 mg by mouth daily.   Yes [provider]    Blood pressure 103/67, pulse 73, SpO2 92%. Exam: General: Well nourished, no acute distress. Head: Normocephalic, no evidence injury, no tenderness, facial buttresses intact without stepoff. Face/sinus: No tenderness to palpation and percussion. Facial movement is normal and symmetric. Eyes: PERRL, EOMI. No scleral icterus, conjunctivae clear. Neuro: CN II exam reveals vision grossly intact.  No nystagmus at any point of gaze. Ears: Auricles well formed without lesions.  Bilateral cerumen impaction.  Nose: External evaluation reveals normal support and skin without lesions.  Dorsum is intact.  Anterior rhinoscopy reveals congested mucosa over anterior aspect of inferior turbinates and intact septum.  No purulence noted. Oral:  Oral cavity and oropharynx are intact, symmetric, without erythema or edema.  Mucosa is moist without lesions. Neck: Full range of motion without pain.  There is no significant lymphadenopathy.  No masses palpable.  Thyroid  bed within normal limits to palpation.  Parotid glands and submandibular glands equal bilaterally without mass.  Trachea is midline.   Procedure: Bilateral cerumen disimpaction Anesthesia: None Description: Under the operating microscope, the cerumen is carefully removed with a  combination of cerumen currette, alligator forceps, and suction catheters.  After the cerumen is removed, the TMs are noted to be intact but retracted, worse on the right side.  No mass, erythema, or lesions. The patient tolerated the procedure well.    The patient could not cooperate with the hearing test.  His tympanogram is flat bilaterally.  Assessment: 1.  Bilateral cerumen impaction.  After the cerumen disimpaction procedure, both tympanic membranes are noted to be intact but retracted. 2.  Likely bilateral conductive hearing loss.  The patient is unable to complete the hearing test.  His tympanogram is flat bilaterally. 3.  Bilateral eustachian tube dysfunction.  No acute infection is noted today.  Plan: 1.  Otomicroscopy with bilateral cerumen disimpaction. 2.  The physical exam findings are reviewed with the patient and his caregiver. 3.  The patient is reassured that no acute infection is noted today. 4.  The patient will return for reevaluation in 6 months.  Laren Whaling W Coral Soler 08/17/2023, 8:32 AM

## 2023-08-17 NOTE — Addendum Note (Signed)
 Addended byBETHA KARIS CLUNES on: 08/17/2023 08:39 AM   Modules accepted: Orders

## 2023-08-23 ENCOUNTER — Encounter: Payer: Self-pay | Admitting: Audiology

## 2023-09-05 ENCOUNTER — Other Ambulatory Visit: Payer: Self-pay | Admitting: Gastroenterology

## 2023-09-06 ENCOUNTER — Telehealth: Payer: Self-pay | Admitting: *Deleted

## 2023-09-06 ENCOUNTER — Other Ambulatory Visit: Payer: Self-pay | Admitting: Gastroenterology

## 2023-09-06 NOTE — Telephone Encounter (Signed)
 Routing to you in absence of Cinderella Christoffersen Mahon,NP. Pt last OV 07/08/2023

## 2023-09-06 NOTE — Telephone Encounter (Signed)
 Rx sent.

## 2023-09-13 ENCOUNTER — Emergency Department (HOSPITAL_COMMUNITY)

## 2023-09-13 ENCOUNTER — Other Ambulatory Visit: Payer: Self-pay

## 2023-09-13 ENCOUNTER — Emergency Department (HOSPITAL_COMMUNITY)
Admission: EM | Admit: 2023-09-13 | Discharge: 2023-09-13 | Disposition: A | Discharge status: Skilled Nursing Facility | Source: Skilled Nursing Facility | Attending: Student | Admitting: Student

## 2023-09-13 ENCOUNTER — Encounter (HOSPITAL_COMMUNITY): Payer: Self-pay | Admitting: Emergency Medicine

## 2023-09-13 DIAGNOSIS — I10 Essential (primary) hypertension: Secondary | ICD-10-CM | POA: Insufficient documentation

## 2023-09-13 DIAGNOSIS — E119 Type 2 diabetes mellitus without complications: Secondary | ICD-10-CM | POA: Insufficient documentation

## 2023-09-13 DIAGNOSIS — U071 COVID-19: Secondary | ICD-10-CM | POA: Insufficient documentation

## 2023-09-13 DIAGNOSIS — Z79899 Other long term (current) drug therapy: Secondary | ICD-10-CM | POA: Diagnosis not present

## 2023-09-13 DIAGNOSIS — Z859 Personal history of malignant neoplasm, unspecified: Secondary | ICD-10-CM | POA: Diagnosis not present

## 2023-09-13 DIAGNOSIS — Z7982 Long term (current) use of aspirin: Secondary | ICD-10-CM | POA: Diagnosis not present

## 2023-09-13 DIAGNOSIS — E039 Hypothyroidism, unspecified: Secondary | ICD-10-CM | POA: Insufficient documentation

## 2023-09-13 DIAGNOSIS — R509 Fever, unspecified: Secondary | ICD-10-CM

## 2023-09-13 LAB — CBC WITH DIFFERENTIAL/PLATELET
Abs Immature Granulocytes: 0.03 K/uL (ref 0.00–0.07)
Basophils Absolute: 0 K/uL (ref 0.0–0.1)
Basophils Relative: 0 %
Eosinophils Absolute: 0.6 K/uL — ABNORMAL HIGH (ref 0.0–0.5)
Eosinophils Relative: 7 %
HCT: 36.2 % — ABNORMAL LOW (ref 39.0–52.0)
Hemoglobin: 12.2 g/dL — ABNORMAL LOW (ref 13.0–17.0)
Immature Granulocytes: 0 %
Lymphocytes Relative: 17 %
Lymphs Abs: 1.4 K/uL (ref 0.7–4.0)
MCH: 29.8 pg (ref 26.0–34.0)
MCHC: 33.7 g/dL (ref 30.0–36.0)
MCV: 88.5 fL (ref 80.0–100.0)
Monocytes Absolute: 1.3 K/uL — ABNORMAL HIGH (ref 0.1–1.0)
Monocytes Relative: 16 %
Neutro Abs: 4.9 K/uL (ref 1.7–7.7)
Neutrophils Relative %: 60 %
Platelets: 207 K/uL (ref 150–400)
RBC: 4.09 MIL/uL — ABNORMAL LOW (ref 4.22–5.81)
RDW: 13.5 % (ref 11.5–15.5)
WBC: 8.2 K/uL (ref 4.0–10.5)
nRBC: 0 % (ref 0.0–0.2)

## 2023-09-13 LAB — RESP PANEL BY RT-PCR (RSV, FLU A&B, COVID)  RVPGX2
Influenza A by PCR: NEGATIVE
Influenza B by PCR: NEGATIVE
Resp Syncytial Virus by PCR: NEGATIVE
SARS Coronavirus 2 by RT PCR: POSITIVE — AB

## 2023-09-13 LAB — COMPREHENSIVE METABOLIC PANEL WITH GFR
ALT: 18 U/L (ref 0–44)
AST: 46 U/L — ABNORMAL HIGH (ref 15–41)
Albumin: 3.5 g/dL (ref 3.5–5.0)
Alkaline Phosphatase: 54 U/L (ref 38–126)
Anion gap: 10 (ref 5–15)
BUN: 12 mg/dL (ref 8–23)
CO2: 21 mmol/L — ABNORMAL LOW (ref 22–32)
Calcium: 8.8 mg/dL — ABNORMAL LOW (ref 8.9–10.3)
Chloride: 105 mmol/L (ref 98–111)
Creatinine, Ser: 1.42 mg/dL — ABNORMAL HIGH (ref 0.61–1.24)
GFR, Estimated: 55 mL/min — ABNORMAL LOW (ref >60)
Glucose, Bld: 101 mg/dL — ABNORMAL HIGH (ref 70–99)
Potassium: 3.8 mmol/L (ref 3.5–5.1)
Sodium: 136 mmol/L (ref 135–145)
Total Bilirubin: 0.4 mg/dL (ref 0.0–1.2)
Total Protein: 7.1 g/dL (ref 6.5–8.1)

## 2023-09-13 MED ORDER — NAPROXEN 250 MG PO TABS
250.0000 mg | ORAL_TABLET | Freq: Once | ORAL | Status: AC
  Administered 2023-09-13: 250 mg via ORAL
  Filled 2023-09-13: qty 1

## 2023-09-13 MED ORDER — PAXLOVID (150/100) 10 X 150 MG & 10 X 100MG PO TBPK
2.0000 | ORAL_TABLET | Freq: Two times a day (BID) | ORAL | 0 refills | Status: AC
Start: 2023-09-13 — End: 2023-09-18

## 2023-09-13 MED ORDER — NAPROXEN 250 MG PO TABS: 500.0000 mg | ORAL_TABLET | Freq: Once | ORAL | Status: DC

## 2023-09-13 NOTE — ED Triage Notes (Signed)
 Pt to ed from [facility] via [facility]. C/o fever 101 at facility, body aches, chills, cough, sore throat. Tylenol  given by facility at 1500.

## 2023-09-13 NOTE — ED Provider Notes (Signed)
 [place] EMERGENCY DEPARTMENT AT [facility] Provider Note  CSN: [id number] Arrival date & time: 09/13/23 1910  Chief Complaint(s) Flu Like Symptoms  HPI Dominik Yordy is a 63 y.o. male with PMH schizoaffective disorder, Tourette's, chronic constipation, HTN, hypothyroidism, MR who presents emergency room for evaluation of flulike symptoms.  Patient arrives from [facility] assisted living where he was found to have a fever of 101.  Patient reportedly complaining of bodyaches, chills, cough and sore throat.  Received Tylenol  by facility at 1500.   Past Medical History Past Medical History:  Diagnosis Date   Anxiety    Cancer (HCC)    Chronic constipation    Diabetes mellitus    HOH (hard of hearing)    Hyperlipemia    Hypertension    Hypothyroidism    Impulsive control disorder   Mental disorder    Mental retardation    Otitis media    Pancreatitis    Schizoaffective disorder (HCC)    Tourette's disease    Patient Active Problem List   Diagnosis Date Noted   Impacted cerumen of both ears 08/17/2023   Conductive hearing loss, bilateral 08/17/2023   Other specified disorders of eustachian tube, bilateral 08/17/2023   DOE (dyspnea on exertion) 09/23/2022   Rhabdomyolysis 08/28/2022   AKI (acute kidney injury) (HCC) 08/27/2022   Cognitive communication deficit 03/15/2022   Unspecified abnormalities of gait and mobility 03/15/2022   Hypothyroidism, unspecified 01/26/2022   Mixed incontinence 01/26/2022   Unspecified hearing loss, unspecified ear 01/26/2022   Schizoaffective disorder, depressive type (HCC)    Intellectual disability 08/26/2012   Aggressive behavior of adult 06/28/2012   Gastroparesis 03/22/2012   Transaminitis 03/11/2012   Abdominal  pain, other specified site with nausea and vomiting, likely due to viral gastroenteritis versus diabetic gastroparesis 01/15/2012   Diabetes (HCC) 01/15/2012   Hypertension 01/15/2012   HLD (hyperlipidemia)  01/15/2012   Schizoaffective disorder (HCC) 01/15/2012   Chronic constipation 01/15/2012   GERD (gastroesophageal reflux disease) 01/15/2012   Left Renal calcification 01/15/2012   Home Medication(s) Prior to Admission medications   Medication Sig Start Date End Date Taking? Authorizing Provider  nirmatrelvir/ritonavir, renal dosing, (PAXLOVID , 150/100,) 10 x 150 MG & 10 x 100MG  TBPK Take 2 tablets by mouth 2 (two) times daily for 5 days. Dosage for moderate renal impairment (eGFR >/= 30 to <60 mL/min): 150 mg nirmatrelvir (one 150 mg tablet) with 100 mg ritonavir (one 100 mg tablet), with both tablets taken together twice daily for 5 days. Not recommended if eGFR < 30 mL/min. PAXLOVID  is not recommend in patients with severe hepatic impairment (Child-Pugh Class C). 09/13/23 09/18/23 Yes Manahil Vanzile, MD  amLODipine  (NORVASC ) 5 MG tablet Take 1 tablet (5 mg total) by mouth every morning. 10/14/13   Jacquetta Sharlot GRADE, NP  amoxicillin -clavulanate (AUGMENTIN ) 875-125 MG tablet Take 1 tablet by mouth every 12 (twelve) hours. 05/18/23   Ula Prentice SAUNDERS, MD  aspirin  EC 81 MG tablet Take 1 tablet (81 mg total) by mouth every morning. 10/14/13   Jacquetta Sharlot GRADE, NP  atorvastatin  (LIPITOR) 40 MG tablet Take 1 tablet (40 mg total) by mouth daily. 09/01/22   Johnson, Clanford L, MD  divalproex  (DEPAKOTE  ER) 500 MG 24 hr tablet Take 2 tablets (1,000 mg total) by mouth at bedtime. 08/29/22   Johnson, Clanford L, MD  ezetimibe  (ZETIA ) 10 MG tablet Take 1 tablet (10 mg total) by mouth daily. 09/01/22   Johnson, Clanford L, MD  haloperidol  (HALDOL ) 5 MG  tablet Take 5 mg by mouth 2 (two) times daily.    [provider]  levothyroxine  (SYNTHROID ) 25 MCG tablet Take 25 mcg by mouth daily before breakfast. 05/12/22   [provider]  LINZESS  290 MCG CAPS capsule Take 1 capsule (290 mcg total) by mouth daily before breakfast. Patient taking differently: Take 1 capsule (290 mcg total) by mouth daily before  breakfast. 05/03/23   Rudy Josette RAMAN, PA-C  loratadine  (CLARITIN ) 10 MG tablet Take 10 mg by mouth at bedtime.    [provider]  metFORMIN  (GLUCOPHAGE ) 500 MG tablet Take 1 tablet (500 mg total) by mouth 2 (two) times daily. 08/31/22   Johnson, Clanford L, MD  Methylcellulose, Laxative, (FIBER THERAPY PO) Take 1 Scoop by mouth See admin instructions. 1 tablespoon in a full glass of water  by mouth 2 times weekly on Tuesday and Thursday.    [provider]  metoprolol  succinate (TOPROL -XL) 25 MG 24 hr tablet Take 12.5 mg by mouth daily. 05/12/22   [provider]  pantoprazole  (PROTONIX ) 40 MG tablet Take 1 tablet (40 mg total) by mouth 2 (two) times daily before a meal. 09/06/23   Rudy, Josette RAMAN, PA-C  polyethylene glycol powder (GLYCOLAX /MIRALAX ) 17 GM/SCOOP powder Take 17 g by mouth daily as needed for mild constipation or moderate constipation (Give if no bowel movement). 07/08/23   Kennedy Charmaine CROME, NP  QUEtiapine  (SEROQUEL  XR) 400 MG 24 hr tablet Take 1 tablet (400 mg total) by mouth at bedtime. 08/29/22   Johnson, Clanford L, MD  Saccharomyces boulardii (PROBIOTIC) 250 MG CAPS Take 1 capsule (250 mg total) by mouth daily. 09/07/23   Rudy Josette RAMAN, PA-C  traZODone  (DESYREL ) 100 MG tablet Take 200 mg by mouth at bedtime.    [provider]  VASCEPA  1 g capsule Take 1 capsule (1 g total) by mouth 2 (two) times daily. 06/30/23   Mallipeddi, Vishnu P, MD  Vitamin D , Ergocalciferol , (DRISDOL ) 1.25 MG (50000 UNIT) CAPS capsule Take 1 capsule (50,000 Units total) by mouth every Sunday. 08/30/22   Johnson, Clanford L, MD  Zinc 50 MG TABS Take 50 mg by mouth daily.    [provider]                                                                                                                                    Past Surgical History Past Surgical History:  Procedure Laterality Date   COLONOSCOPY WITH PROPOFOL  N/A 07/21/2022   Procedure: COLONOSCOPY WITH  PROPOFOL ;  Surgeon: Shaaron Lamar HERO, MD;  [facility];  Service: Endoscopy;  Laterality: N/A;  10:45am, asa 2   EXTERNAL EAR SURGERY     [facility]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    POLYPECTOMY  07/21/2022   Procedure: POLYPECTOMY;  Surgeon: Shaaron Lamar HERO, MD;  [facility];  Service: Endoscopy;;   Family History Family History  Problem Relation  Age of Onset   High blood pressure Sister    High Cholesterol Sister    Heart disease Mother    Stroke Mother     Social History Social History   Tobacco Use   Smoking status: Never   Smokeless tobacco: Never  Vaping Use   Vaping status: Never Used  Substance Use Topics   Alcohol use: No   Drug use: No   Allergies Patient has no known allergies.  Review of Systems Review of Systems  Constitutional:  Positive for chills and fever.  HENT:  Positive for sore throat.   Respiratory:  Positive for cough.     Physical Exam Vital Signs  I have reviewed the triage vital signs BP 108/70 (BP Location: Left Arm)   Pulse 95   Temp 98.7 F (37.1 C) (Oral)   Resp 14   Ht 5' 4 (1.626 m)   Wt 72.8 kg   SpO2 92%   BMI 27.55 kg/m   Physical Exam Constitutional:      General: He is not in acute distress.    Appearance: Normal appearance.  HENT:     Head: Normocephalic and atraumatic.     Nose: No congestion or rhinorrhea.  Eyes:     General:        Right eye: No discharge.        Left eye: No discharge.     Extraocular Movements: Extraocular movements intact.     Pupils: Pupils are equal, round, and reactive to light.  Cardiovascular:     Rate and Rhythm: Normal rate and regular rhythm.     Heart sounds: No murmur heard. Pulmonary:     Effort: No respiratory distress.     Breath sounds: No wheezing or rales.  Abdominal:     General: There is no distension.     Tenderness: There is no abdominal tenderness.  Musculoskeletal:        General: Normal range of motion.     Cervical back: Normal range of motion.   Skin:    General: Skin is warm and dry.  Neurological:     General: No focal deficit present.     Mental Status: He is alert.     ED Results and Treatments Labs (all labs ordered are listed, but only abnormal results are displayed) Labs Reviewed  RESP PANEL BY RT-PCR (RSV, FLU A&B, COVID)  RVPGX2 - Abnormal; Notable for the following components:      Result Value   SARS Coronavirus 2 by RT PCR POSITIVE (*)    All other components within normal limits  COMPREHENSIVE METABOLIC PANEL WITH GFR - Abnormal; Notable for the following components:   CO2 21 (*)    Glucose, Bld 101 (*)    Creatinine, Ser 1.42 (*)    Calcium  8.8 (*)    AST 46 (*)    GFR, Estimated 55 (*)    All other components within normal limits  CBC WITH DIFFERENTIAL/PLATELET - Abnormal; Notable for the following components:   RBC 4.09 (*)    Hemoglobin 12.2 (*)    HCT 36.2 (*)    Monocytes Absolute 1.3 (*)    Eosinophils Absolute 0.6 (*)    All other components within normal limits  Radiology DG Chest 2 View Result Date: 09/13/2023 CLINICAL DATA:  Concern for pneumonia, fever, body aches, cough and sore throat EXAM: CHEST - 2 VIEW COMPARISON:  Radiograph and CT 05/31/2023 FINDINGS: Stable cardiomediastinal silhouette. Left basilar atelectasis or infiltrates. Eventration right hemidiaphragm. No pleural effusion or pneumothorax. IMPRESSION: Left basilar atelectasis or pneumonia. Electronically Signed   By: Norman Gatlin M.D.   On: 09/13/2023 19:49    Pertinent labs & imaging results that were available during my care of the patient were reviewed by me and considered in my medical decision making (see MDM for details).  Medications Ordered in ED Medications  naproxen  (NAPROSYN ) tablet 250 mg (250 mg Oral Given 09/13/23 2001)                                                                                                                                      Procedures Procedures  (including critical care time)  Medical Decision Making / ED Course   This patient presents to the ED for concern of cough, fever, this involves an extensive number of treatment options, and is a complaint that carries with it a high risk of complications and morbidity.  The differential diagnosis includes COVID-19, RSV, influenza, viral illness, pneumonia  MDM: Patient seen emerged part for evaluation of cough and fever.  Physical exam largely unremarkable with some mild oropharyngeal erythema but no tonsillar swelling or exudate.  Pulmonary exam unremarkable.  Laboratory valuation with no leukocytosis, hemoglobin 12.2, creatinine 1.42, AST 46, CO2 21.  Chest x-ray with left basilar atelectasis versus infiltrate.  Patient is COVID-positive and I do suspect this is likely the source of his symptoms.  Previous x-rays have shown bibasilar atelectasis and I have suspicion that the x-ray finding today is likely continuance of his known basilar atelectasis.  Thus we will defer antibiotics at this time.  Paxlovid  prescribed given underlying risk factors but at this time as he is not hypoxic and does not have increased work of breathing he does not meet inpatient criteria for admission will be discharged with outpatient follow-up.   Additional history obtained:  -External records from outside source obtained and reviewed including: Chart review including previous notes, labs, imaging, consultation notes   Lab Tests: -I ordered, reviewed, and interpreted labs.   The pertinent results include:   Labs Reviewed  RESP PANEL BY RT-PCR (RSV, FLU A&B, COVID)  RVPGX2 - Abnormal; Notable for the following components:      Result Value   SARS Coronavirus 2 by RT PCR POSITIVE (*)    All other components within normal limits  COMPREHENSIVE METABOLIC PANEL WITH GFR - Abnormal; Notable for the following components:   CO2 21 (*)     Glucose, Bld 101 (*)    Creatinine, Ser 1.42 (*)    Calcium  8.8 (*)    AST 46 (*)    GFR, Estimated 55 (*)    All other  components within normal limits  CBC WITH DIFFERENTIAL/PLATELET - Abnormal; Notable for the following components:   RBC 4.09 (*)    Hemoglobin 12.2 (*)    HCT 36.2 (*)    Monocytes Absolute 1.3 (*)    Eosinophils Absolute 0.6 (*)    All other components within normal limits        Imaging Studies ordered: I ordered imaging studies including chest x-ray I independently visualized and interpreted imaging. I agree with the radiologist interpretation   Medicines ordered and prescription drug management: Meds ordered this encounter  Medications   DISCONTD: naproxen  (NAPROSYN ) tablet 500 mg   naproxen  (NAPROSYN ) tablet 250 mg   nirmatrelvir/ritonavir, renal dosing, (PAXLOVID , 150/100,) 10 x 150 MG & 10 x 100MG  TBPK    Sig: Take 2 tablets by mouth 2 (two) times daily for 5 days. Dosage for moderate renal impairment (eGFR >/= 30 to <60 mL/min): 150 mg nirmatrelvir (one 150 mg tablet) with 100 mg ritonavir (one 100 mg tablet), with both tablets taken together twice daily for 5 days. Not recommended if eGFR < 30 mL/min. PAXLOVID  is not recommend in patients with severe hepatic impairment (Child-Pugh Class C).    Dispense:  20 tablet    Refill:  0    -I have reviewed the patients home medicines and have made adjustments as needed  Critical interventions none    Cardiac Monitoring: The patient was maintained on a cardiac monitor.  I personally viewed and interpreted the cardiac monitored which showed an underlying rhythm of: NSr  Social Determinants of Health:  Factors impacting patients care include: Lives in skilled nursing facility   Reevaluation: After the interventions noted above, I reevaluated the patient and found that they have :improved  Co morbidities that complicate the patient evaluation  Past Medical History:  Diagnosis Date   Anxiety     Cancer (HCC)    Chronic constipation    Diabetes mellitus    HOH (hard of hearing)    Hyperlipemia    Hypertension    Hypothyroidism    Impulsive control disorder   Mental disorder    Mental retardation    Otitis media    Pancreatitis    Schizoaffective disorder (HCC)    Tourette's disease       Dispostion: I considered admission for this patient, but at this time he does not meet inpatient criteria for admission will be discharged with outpatient follow-up     Final Clinical Impression(s) / ED Diagnoses Final diagnoses:  COVID  Fever, unspecified fever cause     @PCDICTATION @    Albertina Dixon, MD 09/13/23 2256

## 2023-09-13 NOTE — ED Notes (Signed)
 Patient transported to X-ray

## 2023-09-14 ENCOUNTER — Emergency Department (HOSPITAL_COMMUNITY)

## 2023-09-14 ENCOUNTER — Encounter (HOSPITAL_COMMUNITY): Payer: Self-pay

## 2023-09-14 ENCOUNTER — Emergency Department (HOSPITAL_COMMUNITY)
Admission: EM | Admit: 2023-09-14 | Discharge: 2023-09-15 | Disposition: A | Discharge status: Home / Self Care | Source: Skilled Nursing Facility

## 2023-09-14 DIAGNOSIS — Z79899 Other long term (current) drug therapy: Secondary | ICD-10-CM | POA: Insufficient documentation

## 2023-09-14 DIAGNOSIS — D696 Thrombocytopenia, unspecified: Secondary | ICD-10-CM | POA: Insufficient documentation

## 2023-09-14 DIAGNOSIS — U071 COVID-19: Secondary | ICD-10-CM | POA: Diagnosis present

## 2023-09-14 DIAGNOSIS — E039 Hypothyroidism, unspecified: Secondary | ICD-10-CM | POA: Insufficient documentation

## 2023-09-14 DIAGNOSIS — E119 Type 2 diabetes mellitus without complications: Secondary | ICD-10-CM | POA: Diagnosis not present

## 2023-09-14 DIAGNOSIS — I10 Essential (primary) hypertension: Secondary | ICD-10-CM | POA: Insufficient documentation

## 2023-09-14 DIAGNOSIS — Z85828 Personal history of other malignant neoplasm of skin: Secondary | ICD-10-CM | POA: Insufficient documentation

## 2023-09-14 DIAGNOSIS — D72829 Elevated white blood cell count, unspecified: Secondary | ICD-10-CM | POA: Diagnosis not present

## 2023-09-14 DIAGNOSIS — Z7982 Long term (current) use of aspirin: Secondary | ICD-10-CM | POA: Insufficient documentation

## 2023-09-14 LAB — COMPREHENSIVE METABOLIC PANEL WITH GFR
ALT: 16 U/L (ref 0–44)
AST: 40 U/L (ref 15–41)
Albumin: 3.4 g/dL — ABNORMAL LOW (ref 3.5–5.0)
Alkaline Phosphatase: 49 U/L (ref 38–126)
Anion gap: 13 (ref 5–15)
BUN: 11 mg/dL (ref 8–23)
CO2: 22 mmol/L (ref 22–32)
Calcium: 8.7 mg/dL — ABNORMAL LOW (ref 8.9–10.3)
Chloride: 104 mmol/L (ref 98–111)
Creatinine, Ser: 1.29 mg/dL — ABNORMAL HIGH (ref 0.61–1.24)
GFR, Estimated: >60 mL/min (ref >60)
Glucose, Bld: 93 mg/dL (ref 70–99)
Potassium: 3.5 mmol/L (ref 3.5–5.1)
Sodium: 139 mmol/L (ref 135–145)
Total Bilirubin: 0.5 mg/dL (ref 0.0–1.2)
Total Protein: 6.7 g/dL (ref 6.5–8.1)

## 2023-09-14 LAB — CBC WITH DIFFERENTIAL/PLATELET
Abs Immature Granulocytes: 0.06 K/uL (ref 0.00–0.07)
Basophils Absolute: 0 K/uL (ref 0.0–0.1)
Basophils Relative: 0 %
Eosinophils Absolute: 0.3 K/uL (ref 0.0–0.5)
Eosinophils Relative: 3 %
HCT: 36.1 % — ABNORMAL LOW (ref 39.0–52.0)
Hemoglobin: 12 g/dL — ABNORMAL LOW (ref 13.0–17.0)
Immature Granulocytes: 1 %
Lymphocytes Relative: 7 %
Lymphs Abs: 0.7 K/uL (ref 0.7–4.0)
MCH: 29.5 pg (ref 26.0–34.0)
MCHC: 33.2 g/dL (ref 30.0–36.0)
MCV: 88.7 fL (ref 80.0–100.0)
Monocytes Absolute: 1.4 K/uL — ABNORMAL HIGH (ref 0.1–1.0)
Monocytes Relative: 14 %
Neutro Abs: 8 K/uL — ABNORMAL HIGH (ref 1.7–7.7)
Neutrophils Relative %: 75 %
Platelets: 134 K/uL — ABNORMAL LOW (ref 150–400)
RBC: 4.07 MIL/uL — ABNORMAL LOW (ref 4.22–5.81)
RDW: 13.6 % (ref 11.5–15.5)
WBC: 10.6 K/uL — ABNORMAL HIGH (ref 4.0–10.5)
nRBC: 0 % (ref 0.0–0.2)

## 2023-09-14 LAB — URINALYSIS, ROUTINE W REFLEX MICROSCOPIC
Bilirubin Urine: NEGATIVE
Glucose, UA: NEGATIVE mg/dL
Hgb urine dipstick: NEGATIVE
Ketones, ur: 5 mg/dL — AB
Leukocytes,Ua: NEGATIVE
Nitrite: NEGATIVE
Protein, ur: NEGATIVE mg/dL
Specific Gravity, Urine: 1.01 (ref 1.005–1.030)
pH: 6 (ref 5.0–8.0)

## 2023-09-14 LAB — MAGNESIUM: Magnesium: 1.6 mg/dL — ABNORMAL LOW (ref 1.7–2.4)

## 2023-09-14 MED ORDER — ACETAMINOPHEN 500 MG PO TABS
1000.0000 mg | ORAL_TABLET | Freq: Once | ORAL | Status: AC
  Administered 2023-09-14: 1000 mg via ORAL
  Filled 2023-09-14: qty 2

## 2023-09-14 MED ORDER — MAGNESIUM OXIDE -MG SUPPLEMENT 400 (240 MG) MG PO TABS
400.0000 mg | ORAL_TABLET | Freq: Once | ORAL | Status: AC
  Administered 2023-09-14: 400 mg via ORAL
  Filled 2023-09-14: qty 1

## 2023-09-14 MED ORDER — KETOROLAC TROMETHAMINE 15 MG/ML IJ SOLN
15.0000 mg | Freq: Once | INTRAMUSCULAR | Status: AC
  Administered 2023-09-14: 15 mg via INTRAVENOUS
  Filled 2023-09-14 (×2): qty 1

## 2023-09-14 MED ORDER — LACTATED RINGERS IV BOLUS
1000.0000 mL | Freq: Once | INTRAVENOUS | Status: AC
  Administered 2023-09-14: 1000 mL via INTRAVENOUS

## 2023-09-14 NOTE — ED Notes (Signed)
 [facility] called back for transport to [facility]. Shawn Baird

## 2023-09-14 NOTE — ED Triage Notes (Signed)
 Pt seen at this facility yesterday and diagnosed with covid.  Facility said pt is not vomiting or having diarrhea but they are worried he is dehydrated because he is so fatigued and not wanting to eat or drink a lot.

## 2023-09-14 NOTE — ED Notes (Signed)
 [facility] called, I provided them with an update about pt

## 2023-09-14 NOTE — Discharge Instructions (Addendum)
 Workup today was largely unremarkable.  He remained hemodynamically stable.  Please encouraged him to continue to drink p.o. intake.  There is no evidence of dehydration.  Urinary tract infection.  No pneumonia.

## 2023-09-14 NOTE — ED Provider Notes (Signed)
  Physical Exam  BP 103/77   Pulse 79   Temp (!) 100.8 F (38.2 C) (Oral)   Resp 11   Ht 5' 4 (1.626 m)   Wt 72.8 kg   SpO2 91%   BMI 27.55 kg/m   Physical Exam Vitals and nursing note reviewed.  Constitutional:      General: He is not in acute distress.    Appearance: He is well-developed.  HENT:     Head: Normocephalic and atraumatic.  Eyes:     Conjunctiva/sclera: Conjunctivae normal.  Cardiovascular:     Rate and Rhythm: Normal rate and regular rhythm.     Heart sounds: No murmur heard. Pulmonary:     Effort: Pulmonary effort is normal. No respiratory distress.     Breath sounds: Normal breath sounds.  Abdominal:     Palpations: Abdomen is soft.     Tenderness: There is no abdominal tenderness.  Musculoskeletal:        General: No swelling.     Cervical back: Neck supple.  Skin:    General: Skin is warm and dry.     Capillary Refill: Capillary refill takes less than 2 seconds.  Neurological:     Mental Status: He is alert.  Psychiatric:        Mood and Affect: Mood normal.     Procedures  Procedures  ED Course / MDM   Clinical Course as of 09/14/23 1638  Tue Sep 14, 2023  1434 WBC(!): 10.6 Mildly increasing leukocytosis from labs drawn yesterday [HN]  1434 Magnesium (!): 1.6 Will replete mild hypomagnesemia [HN]  1435 Creatinine(!): 1.29 At his baseline [HN]  1502 DG Chest Portable 1 View No active disease. [HN]  1539 Platelets(!): 134 New thrombocytopenia [HN]    Clinical Course User Index [HN] Franklyn Sid SAILOR, MD   Medical Decision Making Amount and/or Complexity of Data Reviewed Labs: ordered. Decision-making details documented in ED Course. Radiology: ordered. Decision-making details documented in ED Course.  Risk OTC drugs. Prescription drug management.      Received patient in signout.  Patient tested positive for COVID yesterday.  Patient presented because of decreased p.o. intake.  Generalized weakness.  Patient was seen  yesterday was diagnosed with COVID.  SIRS slight leukocytosis.  Benign abdomen.  No AKI.  His vital signs been stable.   Pending UA.   UA unremarkable.  No UTI.  She remained on room air.  Unremarkable vital signs.  Unremarkable exam.  Patient will be discharged back to facility.    Simon Lavonia SAILOR, MD 09/14/23 [phone]

## 2023-09-14 NOTE — ED Provider Notes (Signed)
 [place] EMERGENCY DEPARTMENT AT [facility] Provider Note   CSN: [id number] Arrival date & time: 09/14/23  1137     History {Add pertinent medical, surgical, social history, OB history to HPI:1} Chief Complaint  Patient presents with   Covid Positive    Shawn Baird is a 63 y.o. male with schizoaffective disorder, Tourette's, chronic constipation, HTN, hypothyroidism, MR  who presents from [facility] assisted living facility for decreased PO intake, generalized weakness. Pt seen at this ED yesterday and diagnosed with covid. Facility said pt is not vomiting or having diarrhea but they are worried he is dehydrated because he is so fatigued and not wanting to eat or drink a lot. Patient nonverbal for me will not answer questions.  Level 5 caveat.  Per chart review had complained yesterday of bodyaches, chills, cough, sore throat.   Past Medical History:  Diagnosis Date   Anxiety    Cancer (HCC)    Chronic constipation    Diabetes mellitus    HOH (hard of hearing)    Hyperlipemia    Hypertension    Hypothyroidism    Impulsive control disorder   Mental disorder    Mental retardation    Otitis media    Pancreatitis    Schizoaffective disorder (HCC)    Tourette's disease        Home Medications Prior to Admission medications   Medication Sig Start Date End Date Taking? Authorizing Provider  amLODipine  (NORVASC ) 5 MG tablet Take 1 tablet (5 mg total) by mouth every morning. 10/14/13   Jacquetta Sharlot GRADE, NP  amoxicillin -clavulanate (AUGMENTIN ) 875-125 MG tablet Take 1 tablet by mouth every 12 (twelve) hours. 05/18/23   Ula Prentice SAUNDERS, MD  aspirin  EC 81 MG tablet Take 1 tablet (81 mg total) by mouth every morning. 10/14/13   Jacquetta Sharlot GRADE, NP  atorvastatin  (LIPITOR) 40 MG tablet Take 1 tablet (40 mg total) by mouth daily. 09/01/22   Johnson, Clanford L, MD  divalproex  (DEPAKOTE  ER) 500 MG 24 hr tablet Take 2 tablets (1,000 mg total) by mouth at bedtime. 08/29/22    Johnson, Clanford L, MD  ezetimibe  (ZETIA ) 10 MG tablet Take 1 tablet (10 mg total) by mouth daily. 09/01/22   Johnson, Clanford L, MD  haloperidol  (HALDOL ) 5 MG tablet Take 5 mg by mouth 2 (two) times daily.    [provider]  levothyroxine  (SYNTHROID ) 25 MCG tablet Take 25 mcg by mouth daily before breakfast. 05/12/22   [provider]  LINZESS  290 MCG CAPS capsule Take 1 capsule (290 mcg total) by mouth daily before breakfast. Patient taking differently: Take 1 capsule (290 mcg total) by mouth daily before breakfast. 05/03/23   Rudy Josette RAMAN, PA-C  loratadine  (CLARITIN ) 10 MG tablet Take 10 mg by mouth at bedtime.    [provider]  metFORMIN  (GLUCOPHAGE ) 500 MG tablet Take 1 tablet (500 mg total) by mouth 2 (two) times daily. 08/31/22   Johnson, Clanford L, MD  Methylcellulose, Laxative, (FIBER THERAPY PO) Take 1 Scoop by mouth See admin instructions. 1 tablespoon in a full glass of water  by mouth 2 times weekly on Tuesday and Thursday.    [provider]  metoprolol  succinate (TOPROL -XL) 25 MG 24 hr tablet Take 12.5 mg by mouth daily. 05/12/22   [provider]  nirmatrelvir/ritonavir, renal dosing, (PAXLOVID , 150/100,) 10 x 150 MG & 10 x 100MG  TBPK Take 2 tablets by mouth 2 (two) times daily for 5 days. Dosage for moderate renal  impairment (eGFR >/= 30 to <60 mL/min): 150 mg nirmatrelvir (one 150 mg tablet) with 100 mg ritonavir (one 100 mg tablet), with both tablets taken together twice daily for 5 days. Not recommended if eGFR < 30 mL/min. PAXLOVID  is not recommend in patients with severe hepatic impairment (Child-Pugh Class C). 09/13/23 09/18/23  Kommor, Lum, MD  pantoprazole  (PROTONIX ) 40 MG tablet Take 1 tablet (40 mg total) by mouth 2 (two) times daily before a meal. 09/06/23   Rudy Josette RAMAN, PA-C  polyethylene glycol powder (GLYCOLAX /MIRALAX ) 17 GM/SCOOP powder Take 17 g by mouth daily as needed for mild constipation or moderate constipation  (Give if no bowel movement). 07/08/23   Kennedy Charmaine CROME, NP  QUEtiapine  (SEROQUEL  XR) 400 MG 24 hr tablet Take 1 tablet (400 mg total) by mouth at bedtime. 08/29/22   Johnson, Clanford L, MD  Saccharomyces boulardii (PROBIOTIC) 250 MG CAPS Take 1 capsule (250 mg total) by mouth daily. 09/07/23   Rudy Josette RAMAN, PA-C  traZODone  (DESYREL ) 100 MG tablet Take 200 mg by mouth at bedtime.    [provider]  VASCEPA  1 g capsule Take 1 capsule (1 g total) by mouth 2 (two) times daily. 06/30/23   Mallipeddi, Vishnu P, MD  Vitamin D , Ergocalciferol , (DRISDOL ) 1.25 MG (50000 UNIT) CAPS capsule Take 1 capsule (50,000 Units total) by mouth every Sunday. 08/30/22   Johnson, Clanford L, MD  Zinc 50 MG TABS Take 50 mg by mouth daily.    [provider]      Allergies    Patient has no known allergies.    Review of Systems   Review of Systems A 10 point review of systems was performed and is negative unless otherwise reported in HPI.  Physical Exam Updated Vital Signs BP 103/77   Pulse 85   Temp (!) 100.8 F (38.2 C) (Oral)   Resp (!) 23   Ht 5' 4 (1.626 m)   Wt 72.8 kg   SpO2 92%   BMI 27.55 kg/m  Physical Exam General: Normal appearing male, lying in bed.  HEENT: PERRLA, Sclera anicteric, MMM, trachea midline.  Cardiology: RRR, no murmurs/rubs/gallops.  Resp: Normal respiratory rate and effort. CTAB, no wheezes, rhonchi, crackles.  Intermittent dry cough. Abd: Soft, non-tender, non-distended. No rebound tenderness or guarding.  GU: Deferred. MSK: No peripheral edema or signs of trauma. Extremities without deformity or TTP. No cyanosis or clubbing. Skin: warm, dry.  Neuro: Alert, looking around the room, does not follow commands, CNs II-XII grossly intact. MAEs. Nonverbal.  ED Results / Procedures / Treatments   Labs (all labs ordered are listed, but only abnormal results are displayed) Labs Reviewed  COMPREHENSIVE METABOLIC PANEL WITH GFR - Abnormal; Notable for the  following components:      Result Value   Creatinine, Ser 1.29 (*)    Calcium  8.7 (*)    Albumin 3.4 (*)    All other components within normal limits  MAGNESIUM  - Abnormal; Notable for the following components:   Magnesium  1.6 (*)    All other components within normal limits  CBC WITH DIFFERENTIAL/PLATELET - Abnormal; Notable for the following components:   WBC 10.6 (*)    RBC 4.07 (*)    Hemoglobin 12.0 (*)    HCT 36.1 (*)    Platelets 134 (*)    Neutro Abs 8.0 (*)    Monocytes Absolute 1.4 (*)    All other components within normal limits  CBC WITH DIFFERENTIAL/PLATELET  URINALYSIS, ROUTINE W REFLEX  MICROSCOPIC  CBG MONITORING, ED    EKG EKG Interpretation Date/Time:  Tuesday September 14 2023 12:22:36 EDT Ventricular Rate:  95 PR Interval:  142 QRS Duration:  136 QT Interval:  359 QTC Calculation: 452 R Axis:   89  Text Interpretation: Sinus tachycardia Atrial premature complexes Right bundle branch block Confirmed by Franklyn Gills [phone]) on 09/14/2023 2:33:15 PM  Radiology DG Chest 2 View Result Date: 09/13/2023 CLINICAL DATA:  Concern for pneumonia, fever, body aches, cough and sore throat EXAM: CHEST - 2 VIEW COMPARISON:  Radiograph and CT 05/31/2023 FINDINGS: Stable cardiomediastinal silhouette. Left basilar atelectasis or infiltrates. Eventration right hemidiaphragm. No pleural effusion or pneumothorax. IMPRESSION: Left basilar atelectasis or pneumonia. Electronically Signed   By: Norman Gatlin M.D.   On: 09/13/2023 19:49    Procedures Procedures  {Document cardiac monitor, telemetry assessment procedure when appropriate:1}  Medications Ordered in ED Medications  acetaminophen  (TYLENOL ) tablet 1,000 mg (has no administration in time range)  ketorolac  (TORADOL ) 15 MG/ML injection 15 mg (has no administration in time range)  magnesium  oxide (MAG-OX) tablet 400 mg (has no administration in time range)    ED Course/ Medical Decision Making/ A&P                           Medical Decision Making Amount and/or Complexity of Data Reviewed Labs: ordered. Radiology: ordered.    This patient presents to the ED for concern of fatigue, generalized weakness, decreased oral intake, COVID-19, this involves an extensive number of treatment options, and is a complaint that carries with it a high risk of complications and morbidity.  I considered the following differential and admission for this acute, potentially life threatening condition.   MDM:    Facility was worried the patient may be dehydrated because he is not eating or drinking a lot.  Reassuringly he does not have any tachycardia or hypotension.  He also does not have any electrolyte derangements or significant renal injury, just has baseline creatinine around 1.3.  He is is not in any respiratory distress and has no hypoxia, satting 92% on room air consistent with his value yesterday.  His chest x-ray***.  His temperature is at 100.8 and he is given Tylenol  and Toradol .  Clinical Course as of 09/14/23 1436  Tue Sep 14, 2023  1434 WBC(!): 10.6 Mildly increasing leukocytosis from labs drawn yesterday [HN]  1434 Magnesium (!): 1.6 Will replete mild hypomagnesemia [HN]  1435 Creatinine(!): 1.29 At his baseline [HN]    Clinical Course User Index [HN] Franklyn Gills SAILOR, MD    Labs: I Ordered, and personally interpreted labs.  The pertinent results include: Those listed above  Imaging Studies ordered: I ordered imaging studies including chest x-ray I independently visualized and interpreted imaging. I agree with the radiologist interpretation  Additional history obtained from chart review.    Cardiac Monitoring: The patient was maintained on a cardiac monitor.  I personally viewed and interpreted the cardiac monitored which showed an underlying rhythm of: Normal sinus rhythm  Reevaluation: After the interventions noted above, I reevaluated the patient and found that they have  :{resolved/improved/worsened:23923::improved}  Social Determinants of Health:  lives at assisted living facility  Disposition:  ***  Co morbidities that complicate the patient evaluation  Past Medical History:  Diagnosis Date   Anxiety    Cancer (HCC)    Chronic constipation    Diabetes mellitus    HOH (hard of hearing)    Hyperlipemia  Hypertension    Hypothyroidism    Impulsive control disorder   Mental disorder    Mental retardation    Otitis media    Pancreatitis    Schizoaffective disorder (HCC)    Tourette's disease      Medicines Meds ordered this encounter  Medications   acetaminophen  (TYLENOL ) tablet 1,000 mg   ketorolac  (TORADOL ) 15 MG/ML injection 15 mg   magnesium  oxide (MAG-OX) tablet 400 mg    I have reviewed the patients home medicines and have made adjustments as needed  Problem List / ED Course: Problem List Items Addressed This Visit   None        {Document critical care time when appropriate:1} {Document review of labs and clinical decision tools ie heart score, Chads2Vasc2 etc:1}  {Document your independent review of radiology images, and any outside records:1} {Document your discussion with family members, caretakers, and with consultants:1} {Document social determinants of health affecting pt's care:1} {Document your decision making why or why not admission, treatments were needed:1}  This note was created using dictation software, which may contain spelling or grammatical errors.

## 2023-09-15 NOTE — ED Notes (Signed)
 PT watching TV- denies needs

## 2023-10-31 ENCOUNTER — Other Ambulatory Visit: Payer: Self-pay | Admitting: Gastroenterology

## 2023-11-12 ENCOUNTER — Emergency Department (HOSPITAL_COMMUNITY)

## 2023-11-12 ENCOUNTER — Telehealth (HOSPITAL_COMMUNITY): Payer: Self-pay | Admitting: Emergency Medicine

## 2023-11-12 ENCOUNTER — Other Ambulatory Visit: Payer: Self-pay

## 2023-11-12 ENCOUNTER — Emergency Department (HOSPITAL_COMMUNITY)
Admission: EM | Admit: 2023-11-12 | Discharge: 2023-11-12 | Disposition: A | Discharge status: Skilled Nursing Facility | Attending: Emergency Medicine | Admitting: Emergency Medicine

## 2023-11-12 DIAGNOSIS — Z79899 Other long term (current) drug therapy: Secondary | ICD-10-CM | POA: Insufficient documentation

## 2023-11-12 DIAGNOSIS — Z7984 Long term (current) use of oral hypoglycemic drugs: Secondary | ICD-10-CM | POA: Diagnosis not present

## 2023-11-12 DIAGNOSIS — I10 Essential (primary) hypertension: Secondary | ICD-10-CM | POA: Diagnosis not present

## 2023-11-12 DIAGNOSIS — Z7982 Long term (current) use of aspirin: Secondary | ICD-10-CM | POA: Insufficient documentation

## 2023-11-12 DIAGNOSIS — E119 Type 2 diabetes mellitus without complications: Secondary | ICD-10-CM | POA: Insufficient documentation

## 2023-11-12 DIAGNOSIS — R197 Diarrhea, unspecified: Secondary | ICD-10-CM | POA: Diagnosis not present

## 2023-11-12 DIAGNOSIS — R112 Nausea with vomiting, unspecified: Secondary | ICD-10-CM | POA: Insufficient documentation

## 2023-11-12 DIAGNOSIS — K529 Noninfective gastroenteritis and colitis, unspecified: Secondary | ICD-10-CM

## 2023-11-12 LAB — CBC
HCT: 39.5 % (ref 39.0–52.0)
Hemoglobin: 13 g/dL (ref 13.0–17.0)
MCH: 29.1 pg (ref 26.0–34.0)
MCHC: 32.9 g/dL (ref 30.0–36.0)
MCV: 88.4 fL (ref 80.0–100.0)
Platelets: 219 K/uL (ref 150–400)
RBC: 4.47 MIL/uL (ref 4.22–5.81)
RDW: 13.8 % (ref 11.5–15.5)
WBC: 10.2 K/uL (ref 4.0–10.5)
nRBC: 0 % (ref 0.0–0.2)

## 2023-11-12 LAB — COMPREHENSIVE METABOLIC PANEL WITH GFR
ALT: 9 U/L (ref 0–44)
AST: 20 U/L (ref 15–41)
Albumin: 4.4 g/dL (ref 3.5–5.0)
Alkaline Phosphatase: 69 U/L (ref 38–126)
Anion gap: 14 (ref 5–15)
BUN: 15 mg/dL (ref 8–23)
CO2: 24 mmol/L (ref 22–32)
Calcium: 9.3 mg/dL (ref 8.9–10.3)
Chloride: 102 mmol/L (ref 98–111)
Creatinine, Ser: 1.48 mg/dL — ABNORMAL HIGH (ref 0.61–1.24)
GFR, Estimated: 53 mL/min — ABNORMAL LOW (ref >60)
Glucose, Bld: 98 mg/dL (ref 70–99)
Potassium: 4.2 mmol/L (ref 3.5–5.1)
Sodium: 140 mmol/L (ref 135–145)
Total Bilirubin: 0.2 mg/dL (ref 0.0–1.2)
Total Protein: 7.6 g/dL (ref 6.5–8.1)

## 2023-11-12 LAB — LIPASE, BLOOD: Lipase: 88 U/L — ABNORMAL HIGH (ref 11–51)

## 2023-11-12 MED ORDER — ONDANSETRON HCL 4 MG PO TABS: 4.0000 mg | ORAL_TABLET | Freq: Four times a day (QID) | ORAL | 0 refills | Status: AC

## 2023-11-12 MED ORDER — ONDANSETRON HCL 4 MG/2ML IJ SOLN
4.0000 mg | Freq: Once | INTRAMUSCULAR | Status: AC
  Administered 2023-11-12: 4 mg via INTRAVENOUS
  Filled 2023-11-12: qty 2

## 2023-11-12 MED ADMIN — Iohexol Inj 300 MG/ML: 100 mL | INTRAVENOUS | NDC 00407141363

## 2023-11-12 NOTE — Telephone Encounter (Signed)
 Pharmacy called and they do not have the ODT.  They are recommending 8 mg normal Zofran  tablet.

## 2023-11-12 NOTE — Discharge Instructions (Signed)
 Begin taking Zofran  as prescribed as needed for nausea.  Follow-up with primary doctor if not improving in the next few days.

## 2023-11-12 NOTE — ED Provider Notes (Signed)
 [place] EMERGENCY DEPARTMENT AT [facility] Provider Note   CSN: [id number] Arrival date & time: 11/12/23  9941     Patient presents with: Nausea and Emesis (N/V/D)   Shawn Baird is a 63 y.o. male.   Patient is a 63 year old male with history of schizoaffective disorder, hypertension, hyperlipidemia, cognitive delay.  Patient presenting for evaluation of nausea, vomiting, diarrhea.  This began at his extended care facility.  Patient has no additional history as he is basically nonverbal.       Prior to Admission medications   Medication Sig Start Date End Date Taking? Authorizing Provider  amLODipine  (NORVASC ) 5 MG tablet Take 1 tablet (5 mg total) by mouth every morning. 10/14/13   Jacquetta Sharlot GRADE, NP  amoxicillin -clavulanate (AUGMENTIN ) 875-125 MG tablet Take 1 tablet by mouth every 12 (twelve) hours. 05/18/23   Ula Prentice SAUNDERS, MD  aspirin  EC 81 MG tablet Take 1 tablet (81 mg total) by mouth every morning. 10/14/13   Jacquetta Sharlot GRADE, NP  atorvastatin  (LIPITOR) 40 MG tablet Take 1 tablet (40 mg total) by mouth daily. 09/01/22   Johnson, Clanford L, MD  divalproex  (DEPAKOTE  ER) 500 MG 24 hr tablet Take 2 tablets (1,000 mg total) by mouth at bedtime. 08/29/22   Johnson, Clanford L, MD  ezetimibe  (ZETIA ) 10 MG tablet Take 1 tablet (10 mg total) by mouth daily. 09/01/22   Johnson, Clanford L, MD  haloperidol  (HALDOL ) 5 MG tablet Take 5 mg by mouth 2 (two) times daily.    [provider]  levothyroxine  (SYNTHROID ) 25 MCG tablet Take 25 mcg by mouth daily before breakfast. 05/12/22   [provider]  LINZESS  290 MCG CAPS capsule Take 1 capsule (290 mcg total) by mouth daily before breakfast. Patient taking differently: Take 1 capsule (290 mcg total) by mouth daily before breakfast. 05/03/23   Rudy Josette RAMAN, PA-C  loratadine  (CLARITIN ) 10 MG tablet Take 10 mg by mouth at bedtime.    [provider]  metFORMIN  (GLUCOPHAGE ) 500 MG tablet Take 1 tablet (500  mg total) by mouth 2 (two) times daily. 08/31/22   Johnson, Clanford L, MD  Methylcellulose, Laxative, (FIBER THERAPY PO) Take 1 Scoop by mouth See admin instructions. 1 tablespoon in a full glass of water  by mouth 2 times weekly on Tuesday and Thursday.    [provider]  metoprolol  succinate (TOPROL -XL) 25 MG 24 hr tablet Take 12.5 mg by mouth daily. 05/12/22   [provider]  pantoprazole  (PROTONIX ) 40 MG tablet Take 1 tablet (40 mg total) by mouth 2 (two) times daily before a meal. 09/06/23   Rudy, Josette RAMAN, PA-C  polyethylene glycol powder (GLYCOLAX /MIRALAX ) 17 GM/SCOOP powder Take 17 g by mouth daily as needed for mild constipation or moderate constipation (Give if no bowel movement). 07/08/23   Kennedy Charmaine CROME, NP  QUEtiapine  (SEROQUEL  XR) 400 MG 24 hr tablet Take 1 tablet (400 mg total) by mouth at bedtime. 08/29/22   Johnson, Clanford L, MD  Saccharomyces boulardii (PROBIOTIC) 250 MG CAPS Take 1 capsule (250 mg total) by mouth daily. 11/01/23   Kennedy Charmaine CROME, NP  traZODone  (DESYREL ) 100 MG tablet Take 200 mg by mouth at bedtime.    [provider]  VASCEPA  1 g capsule Take 1 capsule (1 g total) by mouth 2 (two) times daily. 06/30/23   Mallipeddi, Vishnu P, MD  Vitamin D , Ergocalciferol , (DRISDOL ) 1.25 MG (50000 UNIT) CAPS capsule Take 1 capsule (50,000 Units total) by mouth every  Sunday. 08/30/22   Johnson, Clanford L, MD  Zinc 50 MG TABS Take 50 mg by mouth daily.    [provider]    Allergies: Patient has no known allergies.    Review of Systems  Unable to perform ROS: Patient nonverbal    Updated Vital Signs BP (!) 126/90 (BP Location: Right Arm)   Pulse 99   Temp 98.2 F (36.8 C) (Oral)   Resp 16   SpO2 94%   Physical Exam Vitals and nursing note reviewed.  Constitutional:      General: He is not in acute distress.    Appearance: He is well-developed. He is not diaphoretic.  HENT:     Head: Normocephalic and atraumatic.   Cardiovascular:     Rate and Rhythm: Normal rate and regular rhythm.     Heart sounds: No murmur heard.    No friction rub.  Pulmonary:     Effort: Pulmonary effort is normal. No respiratory distress.     Breath sounds: Normal breath sounds. No wheezing or rales.  Abdominal:     General: Bowel sounds are normal. There is no distension.     Palpations: Abdomen is soft.     Tenderness: There is no abdominal tenderness.     Comments: Abdomen is somewhat distended and tympanitic.  It is nontender.  Musculoskeletal:        General: Normal range of motion.     Cervical back: Normal range of motion and neck supple.  Skin:    General: Skin is warm and dry.  Neurological:     Mental Status: He is alert and oriented to person, place, and time.     Coordination: Coordination normal.     (all labs ordered are listed, but only abnormal results are displayed) Labs Reviewed  LIPASE, BLOOD - Abnormal; Notable for the following components:      Result Value   Lipase 88 (*)    All other components within normal limits  COMPREHENSIVE METABOLIC PANEL WITH GFR - Abnormal; Notable for the following components:   Creatinine, Ser 1.48 (*)    GFR, Estimated 53 (*)    All other components within normal limits  CBC  URINALYSIS, ROUTINE W REFLEX MICROSCOPIC    EKG: EKG Interpretation Date/Time:  Friday November 12 2023 01:41:35 EDT Ventricular Rate:  95 PR Interval:  150 QRS Duration:  132 QT Interval:  373 QTC Calculation: 469 R Axis:   66  Text Interpretation: Sinus rhythm Right bundle branch block Confirmed by Geroldine Berg ([id number]) on 11/12/2023 3:06:11 AM  Radiology: No results found.   Procedures   Medications Ordered in the ED  ondansetron  (ZOFRAN ) injection 4 mg (4 mg Intravenous Given 11/12/23 0253)                                    Medical Decision Making Amount and/or Complexity of Data Reviewed Labs: ordered. Radiology: ordered.  Risk Prescription drug  management.   Patient is a 63 year old male presenting with nausea, vomiting, and diarrhea as described in the HPI.  Patient arrives here with stable vital signs and is afebrile.  His abdomen is mildly distended and tympanitic, but is otherwise benign.  Laboratory studies obtained including CBC, CMP, and lipase, all of which are basically unremarkable.  CT scan of the abdomen and pelvis showing evidence for enteritis, but no bowel obstruction or bowel wall edema.  Patient has been  given IV fluids along with Zofran  for nausea.  He has not had any further vomiting since receiving the Zofran .  Symptoms most likely related to either a foodborne illness or a viral etiology.  I feel as though patient can safely be discharged.  I will prescribe Zofran  and have patient follow-up as needed.     Final diagnoses:  None    ED Discharge Orders     None          Geroldine Berg, MD 11/12/23 [phone]

## 2023-11-12 NOTE — ED Triage Notes (Signed)
 Pt arrived via EMS [facility] cc of N/V/D since this morning, with left kidney pain. Pt barely speaks. EMS BLS the pt to ER. PMH abnormal kidney function and diabetic. EMS heard coughing and congestion. Pt endorses he has been sick. EMS vitals WNL.

## 2023-11-12 NOTE — ED Notes (Signed)
 This RN spoke with the pts caregiver at [facility] assisted living regarding the pts ability to answer questions. Mrs Charmaine his caregiver endorses that the pt does not know his full name or birth date, the month, or the president of the [place] and this is normal for him. She endorses that the pt has Dx of Cognitive Communication deficit.   For this RN the pt knew his last name and month and date of his birthday regarding the Aox4 questions. Pt was not able to answer any other questions that are asked during Triage.

## 2023-11-12 NOTE — ED Notes (Signed)
 Pt tried giving a urine sample that was unsuccessful. Pt is aware that we need a urine sample.

## 2023-11-12 NOTE — ED Notes (Signed)
 Pt left for CT.

## 2023-11-29 ENCOUNTER — Other Ambulatory Visit: Payer: Self-pay | Admitting: Gastroenterology

## 2023-12-27 ENCOUNTER — Other Ambulatory Visit: Payer: Self-pay | Admitting: Gastroenterology

## 2024-01-14 ENCOUNTER — Emergency Department (HOSPITAL_COMMUNITY)

## 2024-01-14 ENCOUNTER — Other Ambulatory Visit: Payer: Self-pay

## 2024-01-14 ENCOUNTER — Encounter (HOSPITAL_COMMUNITY): Payer: Self-pay

## 2024-01-14 ENCOUNTER — Emergency Department (HOSPITAL_COMMUNITY)
Admission: EM | Admit: 2024-01-14 | Discharge: 2024-01-14 | Disposition: A | Discharge status: Skilled Nursing Facility | Attending: Emergency Medicine | Admitting: Emergency Medicine

## 2024-01-14 ENCOUNTER — Emergency Department (HOSPITAL_COMMUNITY)
Admission: EM | Admit: 2024-01-14 | Discharge: 2024-01-14 | Disposition: A | Discharge status: Home / Self Care | Source: Skilled Nursing Facility | Attending: Emergency Medicine | Admitting: Emergency Medicine

## 2024-01-14 DIAGNOSIS — R4182 Altered mental status, unspecified: Secondary | ICD-10-CM | POA: Diagnosis present

## 2024-01-14 DIAGNOSIS — E119 Type 2 diabetes mellitus without complications: Secondary | ICD-10-CM | POA: Diagnosis not present

## 2024-01-14 DIAGNOSIS — I10 Essential (primary) hypertension: Secondary | ICD-10-CM | POA: Diagnosis not present

## 2024-01-14 DIAGNOSIS — Z7984 Long term (current) use of oral hypoglycemic drugs: Secondary | ICD-10-CM | POA: Diagnosis not present

## 2024-01-14 DIAGNOSIS — R509 Fever, unspecified: Secondary | ICD-10-CM | POA: Diagnosis present

## 2024-01-14 DIAGNOSIS — Z79899 Other long term (current) drug therapy: Secondary | ICD-10-CM | POA: Diagnosis not present

## 2024-01-14 DIAGNOSIS — E039 Hypothyroidism, unspecified: Secondary | ICD-10-CM | POA: Insufficient documentation

## 2024-01-14 DIAGNOSIS — Z7982 Long term (current) use of aspirin: Secondary | ICD-10-CM | POA: Diagnosis not present

## 2024-01-14 DIAGNOSIS — Z79890 Hormone replacement therapy (postmenopausal): Secondary | ICD-10-CM | POA: Diagnosis not present

## 2024-01-14 DIAGNOSIS — J101 Influenza due to other identified influenza virus with other respiratory manifestations: Secondary | ICD-10-CM | POA: Insufficient documentation

## 2024-01-14 LAB — URINALYSIS, ROUTINE W REFLEX MICROSCOPIC
Bilirubin Urine: NEGATIVE
Glucose, UA: NEGATIVE mg/dL
Hgb urine dipstick: NEGATIVE
Ketones, ur: NEGATIVE mg/dL
Leukocytes,Ua: NEGATIVE
Nitrite: NEGATIVE
Protein, ur: NEGATIVE mg/dL
Specific Gravity, Urine: 1.013 (ref 1.005–1.030)
pH: 7 (ref 5.0–8.0)

## 2024-01-14 LAB — CBC WITH DIFFERENTIAL/PLATELET
Abs Immature Granulocytes: 0.05 K/uL (ref 0.00–0.07)
Basophils Absolute: 0 K/uL (ref 0.0–0.1)
Basophils Relative: 0 %
Eosinophils Absolute: 0.4 K/uL (ref 0.0–0.5)
Eosinophils Relative: 6 %
HCT: 37.8 % — ABNORMAL LOW (ref 39.0–52.0)
Hemoglobin: 12.6 g/dL — ABNORMAL LOW (ref 13.0–17.0)
Immature Granulocytes: 1 %
Lymphocytes Relative: 11 %
Lymphs Abs: 0.8 K/uL (ref 0.7–4.0)
MCH: 29.2 pg (ref 26.0–34.0)
MCHC: 33.3 g/dL (ref 30.0–36.0)
MCV: 87.5 fL (ref 80.0–100.0)
Monocytes Absolute: 1.2 K/uL — ABNORMAL HIGH (ref 0.1–1.0)
Monocytes Relative: 16 %
Neutro Abs: 4.8 K/uL (ref 1.7–7.7)
Neutrophils Relative %: 66 %
Platelets: 188 K/uL (ref 150–400)
RBC: 4.32 MIL/uL (ref 4.22–5.81)
RDW: 13.6 % (ref 11.5–15.5)
WBC: 7.3 K/uL (ref 4.0–10.5)
nRBC: 0 % (ref 0.0–0.2)

## 2024-01-14 LAB — BASIC METABOLIC PANEL WITH GFR
Anion gap: 11 (ref 5–15)
BUN: 13 mg/dL (ref 8–23)
CO2: 27 mmol/L (ref 22–32)
Calcium: 9.2 mg/dL (ref 8.9–10.3)
Chloride: 103 mmol/L (ref 98–111)
Creatinine, Ser: 1.39 mg/dL — ABNORMAL HIGH (ref 0.61–1.24)
GFR, Estimated: 57 mL/min — ABNORMAL LOW
Glucose, Bld: 122 mg/dL — ABNORMAL HIGH (ref 70–99)
Potassium: 3.8 mmol/L (ref 3.5–5.1)
Sodium: 141 mmol/L (ref 135–145)

## 2024-01-14 LAB — RESP PANEL BY RT-PCR (RSV, FLU A&B, COVID)  RVPGX2
Influenza A by PCR: POSITIVE — AB
Influenza B by PCR: NEGATIVE
Resp Syncytial Virus by PCR: NEGATIVE
SARS Coronavirus 2 by RT PCR: NEGATIVE

## 2024-01-14 MED ORDER — OSELTAMIVIR PHOSPHATE 30 MG PO CAPS: 30.0000 mg | ORAL_CAPSULE | Freq: Two times a day (BID) | ORAL | 0 refills | Status: AC

## 2024-01-14 MED ORDER — ACETAMINOPHEN 500 MG PO TABS
1000.0000 mg | ORAL_TABLET | Freq: Once | ORAL | Status: AC
  Administered 2024-01-14: 1000 mg via ORAL
  Filled 2024-01-14: qty 2

## 2024-01-14 NOTE — Discharge Instructions (Signed)
 Continue medications as previously prescribed.  Follow-up with your primary doctor and return to the ER if you experience any new and/or concerning issues.

## 2024-01-14 NOTE — ED Notes (Signed)
 Pt has had breakfast and was assisted with this task. Ate all muffin, some of the pears and egg ham and cheese, along with milk and juices.  After finishing breakfast pt was changed and cleaned due to BM. Pt is resting in the bed comfortably, watching tv and call light in reach.

## 2024-01-14 NOTE — ED Triage Notes (Signed)
 [facility] sent pt back to via [facility]. EMS states the facility originally called out due to the pt not acting himself and feeling weak. Pt was dsicharged from hospital this morning. Pt has a chronic cough at baseline and is not able to stand without assistance. Facility has no direct reason for bringing pt beside new shaking. Temp by EMS was 100.3 axillary but the temp that was obtained by triage was 99.1.

## 2024-01-14 NOTE — ED Notes (Addendum)
 Pt continues to call RN into room and then proceeds to just stare when conversed with. Attempted to see what pt wanted but pt did not respond. Simply stared and did not verbally say anything. I asked if pt wanted to go home and he just said yeah an update was provided on what we were waiting on.

## 2024-01-14 NOTE — ED Provider Notes (Signed)
 " [place] EMERGENCY DEPARTMENT AT [facility] Provider Note  CSN: [id number] Arrival date & time: 01/14/24 1716  Chief Complaint(s) Fever  HPI Shawn Baird is a 63 y.o. male history of schizoaffective disorder, Tourette's syndrome, hyperlipidemia, hypertension, intellectual disability presenting for shaking episode.  Patient was seen earlier in the emergency department today for not acting himself had reassuring workup and was discharged.  Apparently after returning to her facility they noticed he was shaking.  Paramedics had an axillary temperature of 100.3 but afebrile here.  Patient is nonverbal so unable to provide any history.  History is limited due to baseline abnormal mental status   Past Medical History Past Medical History:  Diagnosis Date   Anxiety    Cancer (HCC)    Chronic constipation    Diabetes mellitus    HOH (hard of hearing)    Hyperlipemia    Hypertension    Hypothyroidism    Impulsive control disorder   Mental disorder    Mental retardation    Otitis media    Pancreatitis    Schizoaffective disorder (HCC)    Tourette's disease    Patient Active Problem List   Diagnosis Date Noted   Impacted cerumen of both ears 08/17/2023   Conductive hearing loss, bilateral 08/17/2023   Other specified disorders of eustachian tube, bilateral 08/17/2023   DOE (dyspnea on exertion) 09/23/2022   Rhabdomyolysis 08/28/2022   AKI (acute kidney injury) 08/27/2022   Cognitive communication deficit 03/15/2022   Unspecified abnormalities of gait and mobility 03/15/2022   Hypothyroidism, unspecified 01/26/2022   Mixed incontinence 01/26/2022   Unspecified hearing loss, unspecified ear 01/26/2022   Schizoaffective disorder, depressive type (HCC)    Intellectual disability 08/26/2012   Aggressive behavior of adult 06/28/2012   Gastroparesis 03/22/2012   Transaminitis 03/11/2012   Abdominal  pain, other specified site with nausea and vomiting, likely due to  viral gastroenteritis versus diabetic gastroparesis 01/15/2012   Diabetes (HCC) 01/15/2012   Hypertension 01/15/2012   HLD (hyperlipidemia) 01/15/2012   Schizoaffective disorder (HCC) 01/15/2012   Chronic constipation 01/15/2012   GERD (gastroesophageal reflux disease) 01/15/2012   Left Renal calcification 01/15/2012   Home Medication(s) Prior to Admission medications  Medication Sig Start Date End Date Taking? Authorizing Provider  oseltamivir (TAMIFLU) 30 MG capsule Take 1 capsule (30 mg total) by mouth 2 (two) times daily for 5 days. 01/14/24 01/19/24 Yes Francesca Elsie CROME, MD  amLODipine  (NORVASC ) 5 MG tablet Take 1 tablet (5 mg total) by mouth every morning. 10/14/13   Jacquetta Sharlot GRADE, NP  amoxicillin -clavulanate (AUGMENTIN ) 875-125 MG tablet Take 1 tablet by mouth every 12 (twelve) hours. 05/18/23   Ula Prentice SAUNDERS, MD  aspirin  EC 81 MG tablet Take 1 tablet (81 mg total) by mouth every morning. 10/14/13   Jacquetta Sharlot GRADE, NP  atorvastatin  (LIPITOR) 40 MG tablet Take 1 tablet (40 mg total) by mouth daily. 09/01/22   Johnson, Clanford L, MD  divalproex  (DEPAKOTE  ER) 500 MG 24 hr tablet Take 2 tablets (1,000 mg total) by mouth at bedtime. 08/29/22   Johnson, Clanford L, MD  ezetimibe  (ZETIA ) 10 MG tablet Take 1 tablet (10 mg total) by mouth daily. 09/01/22   Johnson, Clanford L, MD  haloperidol  (HALDOL ) 5 MG tablet Take 5 mg by mouth 2 (two) times daily.    [provider]  levothyroxine  (SYNTHROID ) 25 MCG tablet Take 25 mcg by mouth daily before breakfast. 05/12/22   [provider]  LINZESS  290 MCG CAPS capsule  Take 1 capsule (290 mcg total) by mouth daily before breakfast. 05/03/23   Rudy Josette RAMAN, PA-C  loratadine  (CLARITIN ) 10 MG tablet Take 10 mg by mouth at bedtime.    [provider]  metFORMIN  (GLUCOPHAGE ) 500 MG tablet Take 1 tablet (500 mg total) by mouth 2 (two) times daily. 08/31/22   Johnson, Clanford L, MD  Methylcellulose, Laxative, (FIBER THERAPY PO) Take  1 Scoop by mouth See admin instructions. 1 tablespoon in a full glass of water  by mouth 2 times weekly on Tuesday and Thursday.    [provider]  metoprolol  succinate (TOPROL -XL) 25 MG 24 hr tablet Take 12.5 mg by mouth daily. 05/12/22   [provider]  ondansetron  (ZOFRAN ) 4 MG tablet Take 1 tablet (4 mg total) by mouth every 6 (six) hours. 11/12/23   Yolande Lamar BROCKS, MD  pantoprazole  (PROTONIX ) 40 MG tablet Take 1 tablet (40 mg total) by mouth 2 (two) times daily before a meal. 11/29/23   Kennedy Charmaine CROME, NP  polyethylene glycol powder (GLYCOLAX /MIRALAX ) 17 GM/SCOOP powder Take 17 g by mouth daily as needed for mild constipation or moderate constipation (Give if no bowel movement). 07/08/23   Kennedy Charmaine CROME, NP  QUEtiapine  (SEROQUEL  XR) 400 MG 24 hr tablet Take 1 tablet (400 mg total) by mouth at bedtime. 08/29/22   Johnson, Clanford L, MD  Saccharomyces boulardii (PROBIOTIC) 250 MG CAPS Take 1 capsule (250 mg total) by mouth daily. 12/27/23   Kennedy Charmaine CROME, NP  traZODone  (DESYREL ) 100 MG tablet Take 200 mg by mouth at bedtime.    [provider]  VASCEPA  1 g capsule Take 1 capsule (1 g total) by mouth 2 (two) times daily. 06/30/23   Mallipeddi, Vishnu P, MD  Vitamin D , Ergocalciferol , (DRISDOL ) 1.25 MG (50000 UNIT) CAPS capsule Take 1 capsule (50,000 Units total) by mouth every Sunday. 08/30/22   Johnson, Clanford L, MD  Zinc 50 MG TABS Take 50 mg by mouth daily.    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  Review of Systems Review of Systems  Unable to perform ROS: Patient nonverbal    Physical Exam Vital Signs  I have reviewed the triage vital signs BP 134/77 (BP Location: Right Arm)   Pulse (!) 110   Temp 99.1 F (37.3 C)   Resp 18   Ht 5' 4 (1.626 m)   Wt 72.8 kg   SpO2 93%   BMI 27.55 kg/m  Physical Exam Vitals and nursing note reviewed.  Constitutional:      General: He is not in acute distress.    Appearance: Normal appearance.  HENT:     Mouth/Throat:     Mouth: Mucous membranes are moist.  Eyes:     Conjunctiva/sclera: Conjunctivae normal.  Cardiovascular:     Rate and Rhythm: Normal rate and regular rhythm.  Pulmonary:     Effort: Pulmonary effort is normal. No respiratory distress.     Breath sounds: Normal breath sounds.  Abdominal:     General: Abdomen is flat.     Palpations: Abdomen is soft.     Tenderness: There is no abdominal tenderness.  Musculoskeletal:     Right lower leg: No edema.     Left lower leg: No edema.  Skin:    General: Skin is warm and dry.     Capillary Refill: Capillary refill takes less than 2 seconds.  Neurological:     Mental Status: He is alert. Mental status is at baseline.     Comments: Tracks me around, occasionally will start speaking gibberish phrases  Psychiatric:        Mood and Affect: Mood normal.        Behavior: Behavior normal.     ED Results and Treatments Labs (all labs ordered are listed, but only abnormal results are displayed) Labs Reviewed  RESP PANEL BY RT-PCR (RSV, FLU A&B, COVID)  RVPGX2 - Abnormal; Notable for the following components:      Result  Value   Influenza A by PCR POSITIVE (*)    All other components within normal limits                                                                                                                          Radiology DG Chest 1 View Result Date: 01/14/2024 EXAM: 1 VIEW(S) XRAY OF THE CHEST 01/14/2024 06:35:00 PM COMPARISON: 09/14/2023 CLINICAL HISTORY: Cough FINDINGS: LUNGS AND PLEURA: Low lung volumes. Left basilar linear opacity, likely atelectasis. No pleural effusion. No pneumothorax. HEART AND MEDIASTINUM: No acute abnormality of the cardiac and mediastinal silhouettes. BONES AND SOFT TISSUES: No acute osseous abnormality. IMPRESSION: 1. Left basilar linear opacity, likely atelectasis, with low lung volumes. Electronically signed by: Oneil Devonshire MD 01/14/2024 07:03 PM EST RP Workstation: [id number]   CT Head Wo Contrast Result Date: 01/14/2024 EXAM: CT HEAD WITHOUT CONTRAST 01/14/2024 04:59:44 AM TECHNIQUE: CT of the head was performed without the administration of intravenous contrast. Automated  exposure control, iterative reconstruction, and/or weight based adjustment of the mA/kV was utilized to reduce the radiation dose to as low as reasonably achievable. COMPARISON: 05/18/2023 CLINICAL HISTORY: Mental status change, unknown cause. FINDINGS: BRAIN AND VENTRICLES: No acute hemorrhage. No evidence of acute infarct. No hydrocephalus. No extra-axial collection. No mass effect or midline shift. Cerebral atrophy. ORBITS: No acute abnormality. SINUSES: No acute abnormality. SOFT TISSUES AND SKULL: No acute soft tissue abnormality. No skull fracture. Left mastoid effusion. Left middle ear effusion. Prior right mastoidectomy. IMPRESSION: 1. No acute intracranial abnormality. 2. Cerebral atrophy. 3. Left mastoid effusion and left middle ear effusion. Prior right mastoidectomy. Electronically signed by: Evalene Coho MD 01/14/2024 05:04 AM EST RP Workstation: [id number]    Pertinent labs & imaging  results that were available during my care of the patient were reviewed by me and considered in my medical decision making (see MDM for details).  Medications Ordered in ED Medications  acetaminophen  (TYLENOL ) tablet 1,000 mg (1,000 mg Oral Given 01/14/24 1922)                                                                                                                                     Procedures Procedures  (including critical care time)  Medical Decision Making / ED Course   MDM:  63 year old presenting with episode of shaking, borderline fever.  Unclear cause of symptoms.  Did have laboratory testing done earlier today which was overall reassuring without leukocytosis.  His urinalysis was bland.  Will check flu and COVID swab.  He has no abdominal tenderness, reported nausea or vomiting suggest any intra-abdominal process requiring abdominal imaging.  Will obtain chest x-ray also evaluate for possible pneumonia.  Will give Tylenol .  Given patient had laboratory testing performed this morning I do not think this needs to be repeated at this time.  Clinical Course as of 01/14/24 2022  Fri Jan 14, 2024  2020 Flu test is positive.  Will discharge patient back to his living facility.  Will prescribe Tamiflu given he is a SNF resident and elderly [WS]    Clinical Course User Index [WS] Francesca, Elsie CROME, MD     Additional history obtained: -Additional history obtained from ems -External records from outside source obtained and reviewed including: Chart review including previous notes, labs, imaging, consultation notes including prior notes    Lab Tests: -I ordered, reviewed, and interpreted labs.   The pertinent results include:   Labs Reviewed  RESP PANEL BY RT-PCR (RSV, FLU A&B, COVID)  RVPGX2 - Abnormal; Notable for the following components:      Result Value   Influenza A by PCR POSITIVE (*)    All other components within normal limits    Notable for  +Flu  Imaging Studies ordered: I ordered imaging studies including CXR On my interpretation imaging demonstrates no acute process I independently visualized and interpreted imaging. I agree with the radiologist interpretation  Medicines ordered and prescription drug management: Meds ordered this encounter  Medications   acetaminophen  (TYLENOL ) tablet 1,000 mg   oseltamivir (TAMIFLU) 30 MG capsule    Sig: Take 1 capsule (30 mg total) by mouth 2 (two) times daily for 5 days.    Dispense:  10 capsule    Refill:  0    -I have reviewed the patients home medicines and have made adjustments as needed    Reevaluation: After the interventions noted above, I reevaluated the patient and found that their symptoms have improved  Co morbidities that complicate the patient evaluation  Past Medical History:  Diagnosis Date   Anxiety    Cancer (HCC)    Chronic constipation    Diabetes mellitus    HOH (hard of hearing)    Hyperlipemia    Hypertension    Hypothyroidism    Impulsive control disorder   Mental disorder    Mental retardation    Otitis media    Pancreatitis    Schizoaffective disorder (HCC)    Tourette's disease       Dispostion: Disposition decision including need for hospitalization was considered, and patient discharged from emergency department.    Final Clinical Impression(s) / ED Diagnoses Final diagnoses:  Influenza A     This chart was dictated using voice recognition software.  Despite best efforts to proofread,  errors can occur which can change the documentation meaning.     [1]  Social History Tobacco Use   Smoking status: Never   Smokeless tobacco: Never  Vaping Use   Vaping status: Never Used  Substance Use Topics   Alcohol use: No   Drug use: No     Francesca Elsie CROME, MD 01/14/24 2022  "

## 2024-01-14 NOTE — Discharge Instructions (Signed)
 Shawn Baird has the flu. We have prescribed him tamiflu . He can get tylenol  1000 mg every 6 hours for fevers.

## 2024-01-14 NOTE — ED Notes (Signed)
 Spoke with Lorenza at SNF reports that when she went to get pt up to his wheelchair she noticed pt was leaning to the L and had an unsteady gait that is not normal for him, pt was also incontinent of urine and that is not normal for him. LSN was yesterday

## 2024-01-14 NOTE — ED Notes (Signed)
 Pt drinking water

## 2024-01-14 NOTE — ED Provider Notes (Signed)
 " [place] EMERGENCY DEPARTMENT AT [facility] Provider Note   CSN: [id number] Arrival date & time: 01/14/24  [id number]     Patient presents with: No chief complaint on file.   Shawn Baird is a 63 y.o. male.   Patient is a 63 year old male with history of schizoaffective disorder, hyperlipidemia, intellectual delay.  Patient sent from his extended care facility for evaluation of altered mental status.  According to the nurse there, he seemed to be leaning to the side when he attempted to ambulate which he does not normally do.  She also states that he was incontinent of urine which is abnormal for him.  Patient is nonverbal and adds no additional useful history.       Prior to Admission medications  Medication Sig Start Date End Date Taking? Authorizing Provider  amLODipine  (NORVASC ) 5 MG tablet Take 1 tablet (5 mg total) by mouth every morning. 10/14/13   Jacquetta Sharlot GRADE, NP  amoxicillin -clavulanate (AUGMENTIN ) 875-125 MG tablet Take 1 tablet by mouth every 12 (twelve) hours. 05/18/23   Ula Prentice SAUNDERS, MD  aspirin  EC 81 MG tablet Take 1 tablet (81 mg total) by mouth every morning. 10/14/13   Jacquetta Sharlot GRADE, NP  atorvastatin  (LIPITOR) 40 MG tablet Take 1 tablet (40 mg total) by mouth daily. 09/01/22   Johnson, Clanford L, MD  divalproex  (DEPAKOTE  ER) 500 MG 24 hr tablet Take 2 tablets (1,000 mg total) by mouth at bedtime. 08/29/22   Johnson, Clanford L, MD  ezetimibe  (ZETIA ) 10 MG tablet Take 1 tablet (10 mg total) by mouth daily. 09/01/22   Johnson, Clanford L, MD  haloperidol  (HALDOL ) 5 MG tablet Take 5 mg by mouth 2 (two) times daily.    [provider]  levothyroxine  (SYNTHROID ) 25 MCG tablet Take 25 mcg by mouth daily before breakfast. 05/12/22   [provider]  LINZESS  290 MCG CAPS capsule Take 1 capsule (290 mcg total) by mouth daily before breakfast. 05/03/23   Rudy Josette RAMAN, PA-C  loratadine  (CLARITIN ) 10 MG tablet Take 10 mg by mouth at bedtime.     [provider]  metFORMIN  (GLUCOPHAGE ) 500 MG tablet Take 1 tablet (500 mg total) by mouth 2 (two) times daily. 08/31/22   Johnson, Clanford L, MD  Methylcellulose, Laxative, (FIBER THERAPY PO) Take 1 Scoop by mouth See admin instructions. 1 tablespoon in a full glass of water  by mouth 2 times weekly on Tuesday and Thursday.    [provider]  metoprolol  succinate (TOPROL -XL) 25 MG 24 hr tablet Take 12.5 mg by mouth daily. 05/12/22   [provider]  ondansetron  (ZOFRAN ) 4 MG tablet Take 1 tablet (4 mg total) by mouth every 6 (six) hours. 11/12/23   Yolande Lamar BROCKS, MD  pantoprazole  (PROTONIX ) 40 MG tablet Take 1 tablet (40 mg total) by mouth 2 (two) times daily before a meal. 11/29/23   Kennedy Charmaine CROME, NP  polyethylene glycol powder (GLYCOLAX /MIRALAX ) 17 GM/SCOOP powder Take 17 g by mouth daily as needed for mild constipation or moderate constipation (Give if no bowel movement). 07/08/23   Kennedy Charmaine CROME, NP  QUEtiapine  (SEROQUEL  XR) 400 MG 24 hr tablet Take 1 tablet (400 mg total) by mouth at bedtime. 08/29/22   Johnson, Clanford L, MD  Saccharomyces boulardii (PROBIOTIC) 250 MG CAPS Take 1 capsule (250 mg total) by mouth daily. 12/27/23   Kennedy Charmaine CROME, NP  traZODone  (DESYREL ) 100 MG tablet Take 200 mg by mouth at bedtime.  [provider]  VASCEPA  1 g capsule Take 1 capsule (1 g total) by mouth 2 (two) times daily. 06/30/23   Mallipeddi, Vishnu P, MD  Vitamin D , Ergocalciferol , (DRISDOL ) 1.25 MG (50000 UNIT) CAPS capsule Take 1 capsule (50,000 Units total) by mouth every Sunday. 08/30/22   Johnson, Clanford L, MD  Zinc 50 MG TABS Take 50 mg by mouth daily.    [provider]    Allergies: Patient has no known allergies.    Review of Systems  Unable to perform ROS: Patient nonverbal    Updated Vital Signs BP 103/84   Pulse (!) 102   Temp 98.3 F (36.8 C) (Oral)   Resp 20   SpO2 91%   Physical Exam Vitals and nursing note reviewed.   Constitutional:      General: He is not in acute distress.    Appearance: He is well-developed. He is not diaphoretic.  HENT:     Head: Normocephalic and atraumatic.  Cardiovascular:     Rate and Rhythm: Normal rate and regular rhythm.     Heart sounds: No murmur heard.    No friction rub.  Pulmonary:     Effort: Pulmonary effort is normal. No respiratory distress.     Breath sounds: Normal breath sounds. No wheezing or rales.  Abdominal:     General: Bowel sounds are normal. There is no distension.     Palpations: Abdomen is soft.     Tenderness: There is no abdominal tenderness.  Musculoskeletal:        General: Normal range of motion.     Cervical back: Normal range of motion and neck supple.  Skin:    General: Skin is warm and dry.  Neurological:     Mental Status: He is alert.     Coordination: Coordination normal.     Comments: Patient is awake and alert, but does not respond to questions or commands.  From what I understand this is baseline for him.  He is able to move all 4 extremities, but will not do so to command.     (all labs ordered are listed, but only abnormal results are displayed) Labs Reviewed - No data to display  EKG: None  Radiology: No results found.   Procedures   Medications Ordered in the ED - No data to display                                  Medical Decision Making Amount and/or Complexity of Data Reviewed Labs: ordered. Radiology: ordered.   Patient is a 63 year old male with history of Tourette's and intellectual delay and schizoaffective disorder.  Patient sent from his extended care facility for evaluation of altered mental status.  According to staff there, he was leaning to the left and not as responsive as normal.  Patient has little additional history secondary to his baseline mental status and medical history.  Laboratory studies obtained including CBC and basic metabolic panel, both of which are unremarkable.  Urinalysis is  clear.  CT scan of the head obtained showing no acute intracranial process.  Patient is moving all extremities and does not appear to be experiencing a stroke.  Because of his mental status change before uncertain, however patient is actually attempting to speak and is much more awake and alert.  I feel as though he can safely be discharged.  The cause of his mental status change unclear,  but nothing appears emergent.     Final diagnoses:  None    ED Discharge Orders     None          Geroldine Berg, MD 01/14/24 [phone]  "

## 2024-01-14 NOTE — ED Triage Notes (Signed)
 Pt comes from [facility] via [facility], states that pt is acting abnormal, not talking and leaning to one side. Unknown LSN, pt will answer name but does not follow commands

## 2024-02-21 ENCOUNTER — Ambulatory Visit (INDEPENDENT_AMBULATORY_CARE_PROVIDER_SITE_OTHER): Admitting: Otolaryngology

## 2024-03-28 ENCOUNTER — Ambulatory Visit (INDEPENDENT_AMBULATORY_CARE_PROVIDER_SITE_OTHER): Admitting: Otolaryngology
